# Patient Record
Sex: Female | Born: 1940 | Race: White | Hispanic: No | Smoking: Former smoker
Health system: Southern US, Community
[De-identification: ages and names within clinical notes are randomized; demographics above are authoritative.]

## PROBLEM LIST (undated history)

## (undated) DIAGNOSIS — N183 Chronic kidney disease, stage 3 unspecified: Secondary | ICD-10-CM

## (undated) DIAGNOSIS — F419 Anxiety disorder, unspecified: Secondary | ICD-10-CM

## (undated) DIAGNOSIS — M1990 Unspecified osteoarthritis, unspecified site: Secondary | ICD-10-CM

## (undated) DIAGNOSIS — Z9221 Personal history of antineoplastic chemotherapy: Secondary | ICD-10-CM

## (undated) DIAGNOSIS — M545 Low back pain, unspecified: Secondary | ICD-10-CM

## (undated) DIAGNOSIS — E7800 Pure hypercholesterolemia, unspecified: Secondary | ICD-10-CM

## (undated) DIAGNOSIS — F32A Depression, unspecified: Secondary | ICD-10-CM

## (undated) DIAGNOSIS — T7840XA Allergy, unspecified, initial encounter: Secondary | ICD-10-CM

## (undated) DIAGNOSIS — E119 Type 2 diabetes mellitus without complications: Secondary | ICD-10-CM

## (undated) DIAGNOSIS — Z923 Personal history of irradiation: Secondary | ICD-10-CM

## (undated) DIAGNOSIS — I10 Essential (primary) hypertension: Secondary | ICD-10-CM

## (undated) DIAGNOSIS — M7552 Bursitis of left shoulder: Secondary | ICD-10-CM

## (undated) DIAGNOSIS — K56609 Unspecified intestinal obstruction, unspecified as to partial versus complete obstruction: Secondary | ICD-10-CM

## (undated) DIAGNOSIS — Z860100 Personal history of colon polyps, unspecified: Secondary | ICD-10-CM

## (undated) DIAGNOSIS — C801 Malignant (primary) neoplasm, unspecified: Secondary | ICD-10-CM

## (undated) DIAGNOSIS — H8110 Benign paroxysmal vertigo, unspecified ear: Secondary | ICD-10-CM

## (undated) DIAGNOSIS — M810 Age-related osteoporosis without current pathological fracture: Secondary | ICD-10-CM

## (undated) HISTORY — DX: Essential (primary) hypertension: I10

## (undated) HISTORY — DX: Low back pain: M54.5

## (undated) HISTORY — DX: Pure hypercholesterolemia, unspecified: E78.00

## (undated) HISTORY — DX: Bursitis of left shoulder: M75.52

## (undated) HISTORY — DX: Malignant (primary) neoplasm, unspecified: C80.1

## (undated) HISTORY — DX: Benign paroxysmal vertigo, unspecified ear: H81.10

## (undated) HISTORY — DX: Unspecified osteoarthritis, unspecified site: M19.90

## (undated) HISTORY — PX: ABDOMINAL HYSTERECTOMY: SHX81

## (undated) HISTORY — DX: Personal history of colonic polyps: Z86.010

## (undated) HISTORY — DX: Chronic kidney disease, stage 3 unspecified: N18.30

## (undated) HISTORY — DX: Personal history of colon polyps, unspecified: Z86.0100

## (undated) HISTORY — DX: Allergy, unspecified, initial encounter: T78.40XA

## (undated) HISTORY — DX: Low back pain, unspecified: M54.50

## (undated) HISTORY — DX: Personal history of irradiation: Z92.3

## (undated) HISTORY — DX: Depression, unspecified: F32.A

## (undated) HISTORY — DX: Type 2 diabetes mellitus without complications: E11.9

## (undated) HISTORY — DX: Chronic kidney disease, stage 3 (moderate): N18.3

## (undated) HISTORY — DX: Personal history of antineoplastic chemotherapy: Z92.21

---

## 1991-04-26 HISTORY — PX: BREAST LUMPECTOMY: SHX2

## 1997-12-23 ENCOUNTER — Other Ambulatory Visit: Admission: RE | Admit: 1997-12-23 | Discharge: 1997-12-23 | Payer: Self-pay | Admitting: *Deleted

## 1998-12-25 ENCOUNTER — Other Ambulatory Visit: Admission: RE | Admit: 1998-12-25 | Discharge: 1998-12-25 | Payer: Self-pay | Admitting: *Deleted

## 1999-12-28 ENCOUNTER — Other Ambulatory Visit: Admission: RE | Admit: 1999-12-28 | Discharge: 1999-12-28 | Payer: Self-pay | Admitting: *Deleted

## 2000-12-29 ENCOUNTER — Other Ambulatory Visit: Admission: RE | Admit: 2000-12-29 | Discharge: 2000-12-29 | Payer: Self-pay | Admitting: *Deleted

## 2002-01-07 ENCOUNTER — Other Ambulatory Visit: Admission: RE | Admit: 2002-01-07 | Discharge: 2002-01-07 | Payer: Self-pay | Admitting: Obstetrics and Gynecology

## 2008-10-09 ENCOUNTER — Encounter (INDEPENDENT_AMBULATORY_CARE_PROVIDER_SITE_OTHER): Payer: Self-pay | Admitting: Obstetrics and Gynecology

## 2008-10-09 ENCOUNTER — Ambulatory Visit (HOSPITAL_BASED_OUTPATIENT_CLINIC_OR_DEPARTMENT_OTHER): Admission: RE | Admit: 2008-10-09 | Discharge: 2008-10-09 | Payer: Self-pay | Admitting: Obstetrics and Gynecology

## 2008-10-23 HISTORY — PX: ROBOTIC ASSISTED LAPAROSCOPIC VAGINAL HYSTERECTOMY WITH FIBROID REMOVAL: SHX5387

## 2008-10-30 ENCOUNTER — Ambulatory Visit: Admission: RE | Admit: 2008-10-30 | Discharge: 2008-10-30 | Payer: Self-pay | Admitting: Gynecologic Oncology

## 2008-12-04 ENCOUNTER — Ambulatory Visit: Admission: RE | Admit: 2008-12-04 | Discharge: 2008-12-04 | Payer: Self-pay | Admitting: Gynecologic Oncology

## 2008-12-05 ENCOUNTER — Ambulatory Visit: Payer: Self-pay | Admitting: Oncology

## 2009-01-06 ENCOUNTER — Ambulatory Visit: Payer: Self-pay | Admitting: Oncology

## 2009-01-06 LAB — COMPREHENSIVE METABOLIC PANEL
ALT: 14 U/L (ref 0–35)
AST: 17 U/L (ref 0–37)
Albumin: 4.4 g/dL (ref 3.5–5.2)
Alkaline Phosphatase: 59 U/L (ref 39–117)
BUN: 19 mg/dL (ref 6–23)
Calcium: 9.8 mg/dL (ref 8.4–10.5)
Chloride: 104 mEq/L (ref 96–112)
Potassium: 4.1 mEq/L (ref 3.5–5.3)
Sodium: 140 mEq/L (ref 135–145)
Total Protein: 7.1 g/dL (ref 6.0–8.3)

## 2009-01-06 LAB — CBC WITH DIFFERENTIAL/PLATELET
BASO%: 0.3 % (ref 0.0–2.0)
Basophils Absolute: 0 10*3/uL (ref 0.0–0.1)
EOS%: 3.6 % (ref 0.0–7.0)
HGB: 13.2 g/dL (ref 11.6–15.9)
MCH: 30.3 pg (ref 25.1–34.0)
MCV: 88.3 fL (ref 79.5–101.0)
MONO%: 6.7 % (ref 0.0–14.0)
RBC: 4.36 10*6/uL (ref 3.70–5.45)
RDW: 13.6 % (ref 11.2–14.5)
lymph#: 1.4 10*3/uL (ref 0.9–3.3)

## 2009-01-13 ENCOUNTER — Ambulatory Visit: Admission: RE | Admit: 2009-01-13 | Discharge: 2009-01-13 | Payer: Self-pay | Admitting: Gynecologic Oncology

## 2009-01-13 ENCOUNTER — Ambulatory Visit (HOSPITAL_COMMUNITY): Admission: RE | Admit: 2009-01-13 | Discharge: 2009-01-13 | Payer: Self-pay | Admitting: Oncology

## 2009-01-13 LAB — URINALYSIS, MICROSCOPIC - CHCC
Ketones: NEGATIVE mg/dL
Nitrite: NEGATIVE
Protein: NEGATIVE mg/dL
pH: 7 (ref 4.6–8.0)

## 2009-01-15 LAB — URINE CULTURE

## 2009-01-26 LAB — CBC WITH DIFFERENTIAL/PLATELET
Eosinophils Absolute: 0.1 10*3/uL (ref 0.0–0.5)
LYMPH%: 46.3 % (ref 14.0–49.7)
MONO#: 0.1 10*3/uL (ref 0.1–0.9)
NEUT#: 1.2 10*3/uL — ABNORMAL LOW (ref 1.5–6.5)
Platelets: 184 10*3/uL (ref 145–400)
RBC: 4.21 10*6/uL (ref 3.70–5.45)
WBC: 2.6 10*3/uL — ABNORMAL LOW (ref 3.9–10.3)
lymph#: 1.2 10*3/uL (ref 0.9–3.3)

## 2009-01-29 LAB — CBC WITH DIFFERENTIAL/PLATELET
Basophils Absolute: 0 10*3/uL (ref 0.0–0.1)
Eosinophils Absolute: 0.1 10*3/uL (ref 0.0–0.5)
HCT: 36.4 % (ref 34.8–46.6)
HGB: 12.1 g/dL (ref 11.6–15.9)
LYMPH%: 59.1 % — ABNORMAL HIGH (ref 14.0–49.7)
MCHC: 33.2 g/dL (ref 31.5–36.0)
MONO#: 0.3 10*3/uL (ref 0.1–0.9)
NEUT#: 1 10*3/uL — ABNORMAL LOW (ref 1.5–6.5)
NEUT%: 27.8 % — ABNORMAL LOW (ref 38.4–76.8)
Platelets: 178 10*3/uL (ref 145–400)
WBC: 3.4 10*3/uL — ABNORMAL LOW (ref 3.9–10.3)

## 2009-02-02 LAB — CBC WITH DIFFERENTIAL/PLATELET
Basophils Absolute: 0.1 10*3/uL (ref 0.0–0.1)
EOS%: 1.3 % (ref 0.0–7.0)
Eosinophils Absolute: 0.1 10*3/uL (ref 0.0–0.5)
HCT: 35.3 % (ref 34.8–46.6)
HGB: 11.6 g/dL (ref 11.6–15.9)
MCH: 29.2 pg (ref 25.1–34.0)
MCV: 88.9 fL (ref 79.5–101.0)
MONO%: 17.6 % — ABNORMAL HIGH (ref 0.0–14.0)
NEUT#: 5.4 10*3/uL (ref 1.5–6.5)
NEUT%: 51.2 % (ref 38.4–76.8)

## 2009-02-05 ENCOUNTER — Ambulatory Visit: Payer: Self-pay | Admitting: Oncology

## 2009-02-06 LAB — COMPREHENSIVE METABOLIC PANEL
AST: 21 U/L (ref 0–37)
Albumin: 3.8 g/dL (ref 3.5–5.2)
BUN: 16 mg/dL (ref 6–23)
Calcium: 9.5 mg/dL (ref 8.4–10.5)
Chloride: 107 mEq/L (ref 96–112)
Glucose, Bld: 101 mg/dL — ABNORMAL HIGH (ref 70–99)
Potassium: 4.1 mEq/L (ref 3.5–5.3)
Sodium: 140 mEq/L (ref 135–145)
Total Protein: 6.6 g/dL (ref 6.0–8.3)

## 2009-02-06 LAB — CBC WITH DIFFERENTIAL/PLATELET
Basophils Absolute: 0 10*3/uL (ref 0.0–0.1)
EOS%: 1.5 % (ref 0.0–7.0)
Eosinophils Absolute: 0.1 10*3/uL (ref 0.0–0.5)
HGB: 12.1 g/dL (ref 11.6–15.9)
LYMPH%: 41.8 % (ref 14.0–49.7)
MCH: 30 pg (ref 25.1–34.0)
MCV: 88.5 fL (ref 79.5–101.0)
MONO%: 10.9 % (ref 0.0–14.0)
Platelets: 195 10*3/uL (ref 145–400)
RBC: 4.04 10*6/uL (ref 3.70–5.45)
RDW: 13.9 % (ref 11.2–14.5)

## 2009-02-24 LAB — CBC WITH DIFFERENTIAL/PLATELET
BASO%: 0.1 % (ref 0.0–2.0)
HCT: 34.3 % — ABNORMAL LOW (ref 34.8–46.6)
LYMPH%: 26.5 % (ref 14.0–49.7)
MCHC: 33.5 g/dL (ref 31.5–36.0)
MCV: 90.7 fL (ref 79.5–101.0)
MONO#: 0.5 10*3/uL (ref 0.1–0.9)
MONO%: 6.6 % (ref 0.0–14.0)
NEUT%: 65.9 % (ref 38.4–76.8)
Platelets: 196 10*3/uL (ref 145–400)
RBC: 3.78 10*6/uL (ref 3.70–5.45)

## 2009-02-24 LAB — COMPREHENSIVE METABOLIC PANEL
ALT: 18 U/L (ref 0–35)
Alkaline Phosphatase: 95 U/L (ref 39–117)
CO2: 24 mEq/L (ref 19–32)
Creatinine, Ser: 0.99 mg/dL (ref 0.40–1.20)
Glucose, Bld: 113 mg/dL — ABNORMAL HIGH (ref 70–99)
Sodium: 140 mEq/L (ref 135–145)
Total Bilirubin: 0.2 mg/dL — ABNORMAL LOW (ref 0.3–1.2)
Total Protein: 6.9 g/dL (ref 6.0–8.3)

## 2009-03-02 LAB — WHOLE BLOOD GLUCOSE
Glucose: 324 mg/dL — ABNORMAL HIGH (ref 70–100)
HRS PC: 0 Hours

## 2009-03-02 LAB — CBC WITH DIFFERENTIAL/PLATELET
Basophils Absolute: 0 10*3/uL (ref 0.0–0.1)
Eosinophils Absolute: 0 10*3/uL (ref 0.0–0.5)
HGB: 11.3 g/dL — ABNORMAL LOW (ref 11.6–15.9)
MONO#: 0.1 10*3/uL (ref 0.1–0.9)
NEUT#: 3.2 10*3/uL (ref 1.5–6.5)
RBC: 3.77 10*6/uL (ref 3.70–5.45)
RDW: 17 % — ABNORMAL HIGH (ref 11.2–14.5)
WBC: 4 10*3/uL (ref 3.9–10.3)
lymph#: 0.8 10*3/uL — ABNORMAL LOW (ref 0.9–3.3)
nRBC: 0 % (ref 0–0)

## 2009-03-02 LAB — COMPREHENSIVE METABOLIC PANEL
Albumin: 3.7 g/dL (ref 3.5–5.2)
BUN: 16 mg/dL (ref 6–23)
CO2: 22 mEq/L (ref 19–32)
Glucose, Bld: 313 mg/dL — ABNORMAL HIGH (ref 70–99)
Potassium: 4.7 mEq/L (ref 3.5–5.3)
Sodium: 134 mEq/L — ABNORMAL LOW (ref 135–145)
Total Protein: 7 g/dL (ref 6.0–8.3)

## 2009-03-03 ENCOUNTER — Ambulatory Visit: Payer: Self-pay | Admitting: Oncology

## 2009-03-20 LAB — COMPREHENSIVE METABOLIC PANEL
CO2: 27 mEq/L (ref 19–32)
Creatinine, Ser: 0.97 mg/dL (ref 0.40–1.20)
Glucose, Bld: 108 mg/dL — ABNORMAL HIGH (ref 70–99)
Sodium: 144 mEq/L (ref 135–145)
Total Bilirubin: 0.3 mg/dL (ref 0.3–1.2)
Total Protein: 6.4 g/dL (ref 6.0–8.3)

## 2009-03-20 LAB — CBC WITH DIFFERENTIAL/PLATELET
Eosinophils Absolute: 0 10*3/uL (ref 0.0–0.5)
HCT: 31 % — ABNORMAL LOW (ref 34.8–46.6)
HGB: 10.6 g/dL — ABNORMAL LOW (ref 11.6–15.9)
LYMPH%: 29.9 % (ref 14.0–49.7)
MONO#: 0.5 10*3/uL (ref 0.1–0.9)
NEUT#: 3.1 10*3/uL (ref 1.5–6.5)
NEUT%: 59.6 % (ref 38.4–76.8)
Platelets: 227 10*3/uL (ref 145–400)
WBC: 5.1 10*3/uL (ref 3.9–10.3)

## 2009-04-09 ENCOUNTER — Ambulatory Visit: Payer: Self-pay | Admitting: Oncology

## 2009-04-13 ENCOUNTER — Ambulatory Visit: Admission: RE | Admit: 2009-04-13 | Discharge: 2009-04-13 | Payer: Self-pay | Admitting: Gynecologic Oncology

## 2009-04-13 LAB — COMPREHENSIVE METABOLIC PANEL
AST: 24 U/L (ref 0–37)
BUN: 15 mg/dL (ref 6–23)
Calcium: 9.6 mg/dL (ref 8.4–10.5)
Glucose, Bld: 210 mg/dL — ABNORMAL HIGH (ref 70–99)
Potassium: 4 mEq/L (ref 3.5–5.3)
Total Bilirubin: 0.4 mg/dL (ref 0.3–1.2)

## 2009-04-13 LAB — CBC WITH DIFFERENTIAL/PLATELET
BASO%: 0.1 % (ref 0.0–2.0)
LYMPH%: 17.7 % (ref 14.0–49.7)
MCHC: 33.9 g/dL (ref 31.5–36.0)
MONO#: 0 10*3/uL — ABNORMAL LOW (ref 0.1–0.9)
Platelets: 243 10*3/uL (ref 145–400)
RBC: 3.38 10*6/uL — ABNORMAL LOW (ref 3.70–5.45)
RDW: 20.2 % — ABNORMAL HIGH (ref 11.2–14.5)
WBC: 4.1 10*3/uL (ref 3.9–10.3)
lymph#: 0.7 10*3/uL — ABNORMAL LOW (ref 0.9–3.3)

## 2009-05-04 ENCOUNTER — Ambulatory Visit: Payer: Self-pay | Admitting: Oncology

## 2009-05-04 LAB — CBC WITH DIFFERENTIAL/PLATELET
EOS%: 0 % (ref 0.0–7.0)
Eosinophils Absolute: 0 10*3/uL (ref 0.0–0.5)
HCT: 33.6 % — ABNORMAL LOW (ref 34.8–46.6)
MCV: 99.6 fL (ref 79.5–101.0)
MONO%: 0.2 % (ref 0.0–14.0)
Platelets: 234 10*3/uL (ref 145–400)
RBC: 3.38 10*6/uL — ABNORMAL LOW (ref 3.70–5.45)
RDW: 18.2 % — ABNORMAL HIGH (ref 11.2–14.5)
WBC: 5.3 10*3/uL (ref 3.9–10.3)
lymph#: 0.6 10*3/uL — ABNORMAL LOW (ref 0.9–3.3)

## 2009-05-04 LAB — COMPREHENSIVE METABOLIC PANEL
ALT: 22 U/L (ref 0–35)
Albumin: 3.8 g/dL (ref 3.5–5.2)
Alkaline Phosphatase: 72 U/L (ref 39–117)
CO2: 23 mEq/L (ref 19–32)
Glucose, Bld: 252 mg/dL — ABNORMAL HIGH (ref 70–99)
Potassium: 4.6 mEq/L (ref 3.5–5.3)
Sodium: 138 mEq/L (ref 135–145)
Total Bilirubin: 0.3 mg/dL (ref 0.3–1.2)
Total Protein: 6.9 g/dL (ref 6.0–8.3)

## 2009-05-12 LAB — CBC WITH DIFFERENTIAL/PLATELET
Basophils Absolute: 0.1 10*3/uL (ref 0.0–0.1)
Eosinophils Absolute: 0.1 10*3/uL (ref 0.0–0.5)
HGB: 11.3 g/dL — ABNORMAL LOW (ref 11.6–15.9)
LYMPH%: 29.1 % (ref 14.0–49.7)
MCV: 99.4 fL (ref 79.5–101.0)
MONO%: 15 % — ABNORMAL HIGH (ref 0.0–14.0)
NEUT#: 4.6 10*3/uL (ref 1.5–6.5)
NEUT%: 54.4 % (ref 38.4–76.8)
Platelets: 132 10*3/uL — ABNORMAL LOW (ref 145–400)
RBC: 3.48 10*6/uL — ABNORMAL LOW (ref 3.70–5.45)

## 2009-05-22 ENCOUNTER — Ambulatory Visit (HOSPITAL_COMMUNITY): Admission: RE | Admit: 2009-05-22 | Discharge: 2009-05-22 | Payer: Self-pay | Admitting: Oncology

## 2009-05-27 ENCOUNTER — Ambulatory Visit: Admission: RE | Admit: 2009-05-27 | Discharge: 2009-05-27 | Payer: Self-pay | Admitting: Gynecologic Oncology

## 2009-05-27 ENCOUNTER — Other Ambulatory Visit: Admission: RE | Admit: 2009-05-27 | Discharge: 2009-05-27 | Payer: Self-pay | Admitting: Gynecologic Oncology

## 2009-05-27 LAB — CBC WITH DIFFERENTIAL/PLATELET
BASO%: 0.5 % (ref 0.0–2.0)
Eosinophils Absolute: 0.1 10*3/uL (ref 0.0–0.5)
MCV: 100.7 fL (ref 79.5–101.0)
MONO%: 9.8 % (ref 0.0–14.0)
NEUT#: 2.7 10*3/uL (ref 1.5–6.5)
RBC: 3.35 10*6/uL — ABNORMAL LOW (ref 3.70–5.45)
RDW: 16.3 % — ABNORMAL HIGH (ref 11.2–14.5)
WBC: 4.6 10*3/uL (ref 3.9–10.3)
lymph#: 1.4 10*3/uL (ref 0.9–3.3)

## 2009-05-27 LAB — COMPREHENSIVE METABOLIC PANEL
BUN: 16 mg/dL (ref 6–23)
CO2: 27 mEq/L (ref 19–32)
Creatinine, Ser: 0.93 mg/dL (ref 0.40–1.20)
Glucose, Bld: 105 mg/dL — ABNORMAL HIGH (ref 70–99)
Total Bilirubin: 0.4 mg/dL (ref 0.3–1.2)
Total Protein: 6.5 g/dL (ref 6.0–8.3)

## 2009-08-03 ENCOUNTER — Ambulatory Visit: Payer: Self-pay | Admitting: Oncology

## 2009-08-05 ENCOUNTER — Ambulatory Visit: Admission: RE | Admit: 2009-08-05 | Discharge: 2009-08-05 | Payer: Self-pay | Admitting: Gynecologic Oncology

## 2009-08-05 LAB — COMPREHENSIVE METABOLIC PANEL
Albumin: 4.3 g/dL (ref 3.5–5.2)
Alkaline Phosphatase: 56 U/L (ref 39–117)
BUN: 16 mg/dL (ref 6–23)
Calcium: 9.4 mg/dL (ref 8.4–10.5)
Glucose, Bld: 103 mg/dL — ABNORMAL HIGH (ref 70–99)
Potassium: 4.2 mEq/L (ref 3.5–5.3)
Sodium: 141 mEq/L (ref 135–145)
Total Protein: 6.3 g/dL (ref 6.0–8.3)

## 2009-08-05 LAB — CBC WITH DIFFERENTIAL/PLATELET
BASO%: 0.5 % (ref 0.0–2.0)
EOS%: 2.7 % (ref 0.0–7.0)
HCT: 38.5 % (ref 34.8–46.6)
MCH: 32.3 pg (ref 25.1–34.0)
MCHC: 34.1 g/dL (ref 31.5–36.0)
NEUT%: 48 % (ref 38.4–76.8)
RBC: 4.06 10*6/uL (ref 3.70–5.45)
WBC: 4.1 10*3/uL (ref 3.9–10.3)
lymph#: 1.7 10*3/uL (ref 0.9–3.3)

## 2009-11-11 ENCOUNTER — Ambulatory Visit: Payer: Self-pay | Admitting: Oncology

## 2009-11-13 LAB — CBC WITH DIFFERENTIAL/PLATELET
BASO%: 0.4 % (ref 0.0–2.0)
EOS%: 3.5 % (ref 0.0–7.0)
HCT: 35.9 % (ref 34.8–46.6)
LYMPH%: 39.4 % (ref 14.0–49.7)
MCH: 32.1 pg (ref 25.1–34.0)
MCHC: 34.7 g/dL (ref 31.5–36.0)
NEUT%: 49.7 % (ref 38.4–76.8)
RBC: 3.88 10*6/uL (ref 3.70–5.45)
WBC: 4 10*3/uL (ref 3.9–10.3)
lymph#: 1.6 10*3/uL (ref 0.9–3.3)

## 2009-11-13 LAB — COMPREHENSIVE METABOLIC PANEL
ALT: 22 U/L (ref 0–35)
AST: 24 U/L (ref 0–37)
Chloride: 106 mEq/L (ref 96–112)
Creatinine, Ser: 1.09 mg/dL (ref 0.40–1.20)
Sodium: 141 mEq/L (ref 135–145)
Total Bilirubin: 0.9 mg/dL (ref 0.3–1.2)

## 2009-11-16 ENCOUNTER — Ambulatory Visit (HOSPITAL_COMMUNITY): Admission: RE | Admit: 2009-11-16 | Discharge: 2009-11-16 | Payer: Self-pay | Admitting: Oncology

## 2009-11-19 ENCOUNTER — Other Ambulatory Visit: Admission: RE | Admit: 2009-11-19 | Discharge: 2009-11-19 | Payer: Self-pay | Admitting: Gynecologic Oncology

## 2009-11-19 ENCOUNTER — Ambulatory Visit: Admission: RE | Admit: 2009-11-19 | Discharge: 2009-11-19 | Payer: Self-pay | Admitting: Gynecologic Oncology

## 2010-02-09 ENCOUNTER — Ambulatory Visit: Payer: Self-pay | Admitting: Oncology

## 2010-02-11 ENCOUNTER — Ambulatory Visit: Admission: RE | Admit: 2010-02-11 | Discharge: 2010-02-11 | Payer: Self-pay | Admitting: Gynecologic Oncology

## 2010-02-11 LAB — COMPREHENSIVE METABOLIC PANEL
Albumin: 3.8 g/dL (ref 3.5–5.2)
Alkaline Phosphatase: 65 U/L (ref 39–117)
BUN: 18 mg/dL (ref 6–23)
CO2: 29 mEq/L (ref 19–32)
Chloride: 104 mEq/L (ref 96–112)
Potassium: 4 mEq/L (ref 3.5–5.3)
Sodium: 140 mEq/L (ref 135–145)
Total Bilirubin: 0.6 mg/dL (ref 0.3–1.2)
Total Protein: 7.1 g/dL (ref 6.0–8.3)

## 2010-02-11 LAB — CBC WITH DIFFERENTIAL/PLATELET
BASO%: 0.2 % (ref 0.0–2.0)
EOS%: 3.4 % (ref 0.0–7.0)
Eosinophils Absolute: 0.2 10*3/uL (ref 0.0–0.5)
HCT: 37.5 % (ref 34.8–46.6)
LYMPH%: 25.9 % (ref 14.0–49.7)
MCHC: 34.1 g/dL (ref 31.5–36.0)
MONO#: 0.5 10*3/uL (ref 0.1–0.9)
NEUT#: 4.2 10*3/uL (ref 1.5–6.5)
NEUT%: 62.5 % (ref 38.4–76.8)
RBC: 4.08 10*6/uL (ref 3.70–5.45)
WBC: 6.7 10*3/uL (ref 3.9–10.3)
lymph#: 1.7 10*3/uL (ref 0.9–3.3)

## 2010-03-01 ENCOUNTER — Ambulatory Visit (HOSPITAL_COMMUNITY): Admission: RE | Admit: 2010-03-01 | Discharge: 2010-03-01 | Payer: Self-pay | Admitting: Oncology

## 2010-03-12 ENCOUNTER — Ambulatory Visit
Admission: RE | Admit: 2010-03-12 | Discharge: 2010-04-21 | Payer: Self-pay | Source: Home / Self Care | Attending: Radiation Oncology | Admitting: Radiation Oncology

## 2010-03-30 ENCOUNTER — Ambulatory Visit: Payer: Self-pay | Admitting: Oncology

## 2010-03-31 LAB — COMPREHENSIVE METABOLIC PANEL
ALT: 18 U/L (ref 0–35)
CO2: 29 mEq/L (ref 19–32)
Calcium: 9.4 mg/dL (ref 8.4–10.5)
Chloride: 107 mEq/L (ref 96–112)
Creatinine, Ser: 1.17 mg/dL (ref 0.40–1.20)
Total Protein: 6.3 g/dL (ref 6.0–8.3)

## 2010-03-31 LAB — CBC WITH DIFFERENTIAL/PLATELET
BASO%: 0.3 % (ref 0.0–2.0)
EOS%: 2.1 % (ref 0.0–7.0)
HCT: 35.8 % (ref 34.8–46.6)
LYMPH%: 32.4 % (ref 14.0–49.7)
MCH: 31.6 pg (ref 25.1–34.0)
MCHC: 33.9 g/dL (ref 31.5–36.0)
MONO#: 0.4 10*3/uL (ref 0.1–0.9)
MONO%: 6.2 % (ref 0.0–14.0)
NEUT#: 3.6 10*3/uL (ref 1.5–6.5)
NEUT%: 59 % (ref 38.4–76.8)
Platelets: 310 10*3/uL (ref 145–400)
RBC: 3.84 10*6/uL (ref 3.70–5.45)
WBC: 6 10*3/uL (ref 3.9–10.3)

## 2010-04-22 LAB — COMPREHENSIVE METABOLIC PANEL
AST: 22 U/L (ref 0–37)
Albumin: 3.7 g/dL (ref 3.5–5.2)
Calcium: 9.4 mg/dL (ref 8.4–10.5)
Chloride: 107 mEq/L (ref 96–112)
Creatinine, Ser: 1.06 mg/dL (ref 0.40–1.20)
Potassium: 4.3 mEq/L (ref 3.5–5.3)
Sodium: 141 mEq/L (ref 135–145)

## 2010-04-22 LAB — CBC WITH DIFFERENTIAL/PLATELET
BASO%: 0.1 % (ref 0.0–2.0)
HCT: 34.5 % — ABNORMAL LOW (ref 34.8–46.6)
MCHC: 34.1 g/dL (ref 31.5–36.0)
MONO#: 0.5 10*3/uL (ref 0.1–0.9)
NEUT%: 63.3 % (ref 38.4–76.8)
WBC: 6.1 10*3/uL (ref 3.9–10.3)
lymph#: 1.7 10*3/uL (ref 0.9–3.3)

## 2010-04-30 ENCOUNTER — Ambulatory Visit: Payer: Self-pay | Admitting: Oncology

## 2010-04-30 LAB — CBC WITH DIFFERENTIAL/PLATELET
BASO%: 0.1 % (ref 0.0–2.0)
Basophils Absolute: 0 10*3/uL (ref 0.0–0.1)
EOS%: 0 % (ref 0.0–7.0)
Eosinophils Absolute: 0 10*3/uL (ref 0.0–0.5)
HCT: 38 % (ref 34.8–46.6)
HGB: 12.7 g/dL (ref 11.6–15.9)
LYMPH%: 11.9 % — ABNORMAL LOW (ref 14.0–49.7)
MCH: 30.8 pg (ref 25.1–34.0)
MCHC: 33.4 g/dL (ref 31.5–36.0)
MCV: 92.2 fL (ref 79.5–101.0)
MONO#: 0 10*3/uL — ABNORMAL LOW (ref 0.1–0.9)
MONO%: 0.3 % (ref 0.0–14.0)
NEUT#: 7.7 10*3/uL — ABNORMAL HIGH (ref 1.5–6.5)
NEUT%: 87.7 % — ABNORMAL HIGH (ref 38.4–76.8)
Platelets: 279 10*3/uL (ref 145–400)
RBC: 4.12 10*6/uL (ref 3.70–5.45)
RDW: 15 % — ABNORMAL HIGH (ref 11.2–14.5)
WBC: 8.7 10*3/uL (ref 3.9–10.3)
lymph#: 1 10*3/uL (ref 0.9–3.3)
nRBC: 0 % (ref 0–0)

## 2010-05-12 ENCOUNTER — Ambulatory Visit
Admission: RE | Admit: 2010-05-12 | Discharge: 2010-05-12 | Payer: Self-pay | Source: Home / Self Care | Attending: Gynecologic Oncology | Admitting: Gynecologic Oncology

## 2010-05-13 NOTE — Consult Note (Signed)
NAMEAXIE, HAYNE NO.:  [id number]  MEDICAL RECORD NO.:  [id number]          PATIENT TYPE:  OUT  LOCATION:  GYN                          FACILITY:  [facility]  PHYSICIAN:  Laurette Schimke, MD     DATE OF BIRTH:  [birth year]  DATE OF CONSULTATION:  05/12/2010 DATE OF DISCHARGE:                                CONSULTATION   REASON FOR VISIT:  Recurrent endometrial carcinoma.  HISTORY OF PRESENT ILLNESS:  [name] is a 70 year old who in April 2010 was diagnosed with a grade 1 endometrial cancer.  In July 2010, she underwent a robotic-assisted total laparoscopic hysterectomy, bilateral salpingo-oophorectomy, bilateral pelvic lymph node dissection.  Final pathology was notable for microscopic involvement of bilateral adnexa with 1-mm invasion into the uterine wall with grade 2 histology.  She was enrolled onto GOG protocol 258 and randomized to receive 6 cycles of Taxol and carboplatin therapy.  She completed this and was without evidence of disease on clinical and radiographic surveillance until October 2011 when at the time of vaginal examination was noted to have an 8-mm lesion in the left aspect of the vagina.  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  A PET scan was subsequently obtained and demonstrated the presence of a 1.7-cm subcutaneous soft tissue mass in the left anterior abdominal wall and mild uptake in the left obturator and inguinal lymph nodes consistent with metastatic disease.  She subsequently received 2 cycles of Taxol and carboplatin therapy.  [name] was initially able to palpate the abdominal wall metastatic lesion.  She states that this is no longer palpable.  She reports occasional vaginal bleeding.  She reports weight gain.  No nausea, vomiting or abdominal pain.  No changes in her bowel or rectal habits and she feels well.  PAST MEDICAL HISTORY:  Recurrent endometrial cancer.  PAST  SURGICAL HISTORY:  No interval changes.  SOCIAL HISTORY:  No changes.  REVIEW OF SYSTEMS:  Ten-point review of systems noted as above.  PHYSICAL EXAMINATION:  GENERAL:  Well-developed female in no acute distress. VITAL SIGNS:  Weight 163 pounds, blood pressure 130/70, pulse of 76. Pain 0/10. CHEST:  Clear to auscultation. HEART:  Regular rate and rhythm. ABDOMEN:  Soft, nontender.  No palpable masses at the laparoscopic port sites.  No evidence of an omental cake or fluid wave. PELVIC:  The vaginal lesion is still visible and has decreased in size. Vagina otherwise is atrophic. RECTAL:  Good anal sphincter tone without any masses. EXTREMITIES:  No clubbing, cyanosis or edema.  IMPRESSION AND PLAN:  Recurrent endometrial cancer.  Cycle #2 of Taxol and carboplatin therapy was administered on April 22, 2010.  Cycle #3 is scheduled for next week.  The plan is to administer 4 cycles and then repeat radiological assessment.  It is likely that with resolution of the other metastatic sites, radiation therapy would still be needed on sterilization of the vagina.  The mass has decreased somewhat in size but is still quite visible.  Dr. Roselind Messier saw [name] in consultation on March 18, 2010, and his assessment and recommendations are noted and appreciated. I  have asked [name] to follow up after her radiographic assessment.  At this visit, a CA-125 was added to her pre-chemo labs.     Laurette Schimke, MD     WB/MEDQ  D:  05/12/2010  T:  05/12/2010  Job:  [id number]  cc:   Telford Nab, R.N. [address] [place], [place] [address]  Lennis P. Darrold Span, M.D. Fax: [phone]  Billie Lade, M.D. Fax: [phone]  Gaspar Garbe, M.D. Fax: [phone]  Electronically Signed by Laurette Schimke MD on 05/13/2010 02:37:26 PM

## 2010-05-14 LAB — COMPREHENSIVE METABOLIC PANEL
ALT: 19 U/L (ref 0–35)
AST: 18 U/L (ref 0–37)
Albumin: 4.1 g/dL (ref 3.5–5.2)
Alkaline Phosphatase: 88 U/L (ref 39–117)
BUN: 16 mg/dL (ref 6–23)
CO2: 27 mEq/L (ref 19–32)
Calcium: 9.6 mg/dL (ref 8.4–10.5)
Chloride: 104 mEq/L (ref 96–112)
Creatinine, Ser: 1.06 mg/dL (ref 0.40–1.20)
Glucose, Bld: 103 mg/dL — ABNORMAL HIGH (ref 70–99)
Potassium: 4.2 mEq/L (ref 3.5–5.3)
Sodium: 140 mEq/L (ref 135–145)
Total Bilirubin: 0.3 mg/dL (ref 0.3–1.2)
Total Protein: 6.2 g/dL (ref 6.0–8.3)

## 2010-05-14 LAB — CBC WITH DIFFERENTIAL/PLATELET
BASO%: 0.2 % (ref 0.0–2.0)
Basophils Absolute: 0 10*3/uL (ref 0.0–0.1)
EOS%: 1.3 % (ref 0.0–7.0)
Eosinophils Absolute: 0.1 10*3/uL (ref 0.0–0.5)
HCT: 34.3 % — ABNORMAL LOW (ref 34.8–46.6)
HGB: 11.6 g/dL (ref 11.6–15.9)
LYMPH%: 26 % (ref 14.0–49.7)
MCH: 31.7 pg (ref 25.1–34.0)
MCHC: 34 g/dL (ref 31.5–36.0)
MCV: 93.4 fL (ref 79.5–101.0)
MONO#: 0.3 10*3/uL (ref 0.1–0.9)
MONO%: 4.6 % (ref 0.0–14.0)
NEUT#: 4 10*3/uL (ref 1.5–6.5)
NEUT%: 67.9 % (ref 38.4–76.8)
Platelets: 215 10*3/uL (ref 145–400)
RBC: 3.67 10*6/uL — ABNORMAL LOW (ref 3.70–5.45)
RDW: 16.8 % — ABNORMAL HIGH (ref 11.2–14.5)
WBC: 6 10*3/uL (ref 3.9–10.3)
lymph#: 1.6 10*3/uL (ref 0.9–3.3)

## 2010-05-14 LAB — CA 125: CA 125: 9.6 U/mL (ref 0.0–30.2)

## 2010-05-16 ENCOUNTER — Encounter: Payer: Self-pay | Admitting: Oncology

## 2010-05-17 ENCOUNTER — Other Ambulatory Visit: Payer: Self-pay | Admitting: Oncology

## 2010-05-17 DIAGNOSIS — C541 Malignant neoplasm of endometrium: Secondary | ICD-10-CM

## 2010-05-21 LAB — CBC WITH DIFFERENTIAL/PLATELET
BASO%: 0.4 % (ref 0.0–2.0)
Basophils Absolute: 0 10*3/uL (ref 0.0–0.1)
HCT: 35.1 % (ref 34.8–46.6)
HGB: 12 g/dL (ref 11.6–15.9)
MCH: 31.9 pg (ref 25.1–34.0)
MCV: 93.6 fL (ref 79.5–101.0)
MONO%: 0.1 % (ref 0.0–14.0)
NEUT%: 83.1 % — ABNORMAL HIGH (ref 38.4–76.8)
Platelets: 278 10*3/uL (ref 145–400)
lymph#: 0.9 10*3/uL (ref 0.9–3.3)

## 2010-06-09 ENCOUNTER — Other Ambulatory Visit: Payer: Self-pay | Admitting: Oncology

## 2010-06-09 ENCOUNTER — Encounter (HOSPITAL_BASED_OUTPATIENT_CLINIC_OR_DEPARTMENT_OTHER): Payer: No Typology Code available for payment source | Admitting: Oncology

## 2010-06-09 DIAGNOSIS — C549 Malignant neoplasm of corpus uteri, unspecified: Secondary | ICD-10-CM

## 2010-06-09 DIAGNOSIS — Z5111 Encounter for antineoplastic chemotherapy: Secondary | ICD-10-CM

## 2010-06-09 LAB — CBC WITH DIFFERENTIAL/PLATELET
Basophils Absolute: 0 10*3/uL (ref 0.0–0.1)
EOS%: 0.6 % (ref 0.0–7.0)
Eosinophils Absolute: 0 10*3/uL (ref 0.0–0.5)
HGB: 11.8 g/dL (ref 11.6–15.9)
LYMPH%: 25.8 % (ref 14.0–49.7)
MCH: 32.6 pg (ref 25.1–34.0)
MCV: 96.1 fL (ref 79.5–101.0)
MONO%: 8.5 % (ref 0.0–14.0)
NEUT#: 3.9 10*3/uL (ref 1.5–6.5)
Platelets: 223 10*3/uL (ref 145–400)
RBC: 3.63 10*6/uL — ABNORMAL LOW (ref 3.70–5.45)
RDW: 17.7 % — ABNORMAL HIGH (ref 11.2–14.5)
WBC: 6 10*3/uL (ref 3.9–10.3)

## 2010-06-09 LAB — COMPREHENSIVE METABOLIC PANEL
AST: 17 U/L (ref 0–37)
Albumin: 4.4 g/dL (ref 3.5–5.2)
Alkaline Phosphatase: 85 U/L (ref 39–117)
BUN: 19 mg/dL (ref 6–23)
Creatinine, Ser: 1.03 mg/dL (ref 0.40–1.20)
Glucose, Bld: 120 mg/dL — ABNORMAL HIGH (ref 70–99)
Sodium: 141 mEq/L (ref 135–145)

## 2010-06-11 ENCOUNTER — Encounter (HOSPITAL_BASED_OUTPATIENT_CLINIC_OR_DEPARTMENT_OTHER): Payer: Medicare Other | Admitting: Oncology

## 2010-06-11 DIAGNOSIS — C549 Malignant neoplasm of corpus uteri, unspecified: Secondary | ICD-10-CM

## 2010-06-11 DIAGNOSIS — Z5111 Encounter for antineoplastic chemotherapy: Secondary | ICD-10-CM

## 2010-06-12 ENCOUNTER — Encounter (HOSPITAL_BASED_OUTPATIENT_CLINIC_OR_DEPARTMENT_OTHER): Payer: Medicare Other | Admitting: Oncology

## 2010-06-12 DIAGNOSIS — Z5189 Encounter for other specified aftercare: Secondary | ICD-10-CM

## 2010-06-12 DIAGNOSIS — C549 Malignant neoplasm of corpus uteri, unspecified: Secondary | ICD-10-CM

## 2010-07-05 ENCOUNTER — Other Ambulatory Visit (HOSPITAL_COMMUNITY): Payer: Self-pay

## 2010-07-05 ENCOUNTER — Ambulatory Visit (HOSPITAL_COMMUNITY)
Admission: RE | Admit: 2010-07-05 | Discharge: 2010-07-05 | Disposition: A | Discharge status: Home / Self Care | Payer: Medicare Other | Source: Ambulatory Visit | Attending: Oncology | Admitting: Oncology

## 2010-07-05 DIAGNOSIS — C541 Malignant neoplasm of endometrium: Secondary | ICD-10-CM

## 2010-07-05 DIAGNOSIS — C50919 Malignant neoplasm of unspecified site of unspecified female breast: Secondary | ICD-10-CM | POA: Insufficient documentation

## 2010-07-05 DIAGNOSIS — R599 Enlarged lymph nodes, unspecified: Secondary | ICD-10-CM | POA: Insufficient documentation

## 2010-07-05 DIAGNOSIS — C779 Secondary and unspecified malignant neoplasm of lymph node, unspecified: Secondary | ICD-10-CM | POA: Insufficient documentation

## 2010-07-05 DIAGNOSIS — Z9221 Personal history of antineoplastic chemotherapy: Secondary | ICD-10-CM | POA: Insufficient documentation

## 2010-07-05 DIAGNOSIS — C549 Malignant neoplasm of corpus uteri, unspecified: Secondary | ICD-10-CM | POA: Insufficient documentation

## 2010-07-05 MED ORDER — IOHEXOL 300 MG/ML  SOLN
100.0000 mL | Freq: Once | INTRAMUSCULAR | Status: AC | PRN
  Administered 2010-07-05: 100 mL via INTRAVENOUS

## 2010-07-08 ENCOUNTER — Ambulatory Visit: Payer: Medicare Other | Attending: Gynecologic Oncology | Admitting: Gynecologic Oncology

## 2010-07-08 DIAGNOSIS — R599 Enlarged lymph nodes, unspecified: Secondary | ICD-10-CM | POA: Insufficient documentation

## 2010-07-08 DIAGNOSIS — C549 Malignant neoplasm of corpus uteri, unspecified: Secondary | ICD-10-CM | POA: Insufficient documentation

## 2010-07-08 NOTE — Consult Note (Signed)
NAMECYNTHIS, [name] NO.:  [id number]  MEDICAL RECORD NO.:  [id number]           PATIENT TYPE:  O  LOCATION:  XRAY                         FACILITY:  [facility]  PHYSICIAN:  Laurette Schimke, MD     DATE OF BIRTH:  08-01-1940  DATE OF CONSULTATION:  07/08/2010 DATE OF DISCHARGE:                                  CONSULTATION   REASON FOR VISIT:  Recurrent endometrial cancer.  HISTORY OF PRESENT ILLNESS:  This is a 70 year old who presented with vaginal bleeding in 2010 which was notable for a grade 1 endometrial cancer.  In July  2010, she underwent robotic assisted laparoscopic hysterectomy and bilateral salpingo-oophorectomy and lymph node dissection.  Final pathology was notable for involvement of bilateral ovaries.  She was enrolled in GOG protocol 258 and randomized to receive 6 cycles of paclitaxel and carboplatin.  Post-treatment CT scan in May 22, 2009, was negative.  A CT scan of the abdomen and pelvis on November 16, 2009, was also negative.  She presented for surveillance, and she was noted to have on surveillance visit at the end of 2011 vaginal recurrence.  A CT scan was obtained, was notable for a mass at the pelvic port site and also enlarged obturator nodes.  She received 4 cycles of carboplatin and Taxol.  Imaging on July 05, 2010, showed that left obturator and inguinal lymphadenopathy decreased, and there were findings of soft tissue mass in the abdominal wall had decreased in size.  [name] reports increasing appetite.  She reports that she feels better.  Reports weight gain.  She denies nausea, vomiting, or abdominal pain.  She reports some nausea and constipation associated with chemotherapy and antiemetic medications.  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  PAST MEDICAL HISTORY:  No interval changes.  PAST SURGICAL HISTORY:  No interval changes.  SOCIAL HISTORY:  Continues to  work.  REVIEW OF SYSTEMS:  A 10-point review of systems is as noted above.  PHYSICAL EXAMINATION:  GENERAL:  Well-developed female in no acute distress. CHEST:  Clear to auscultation. HEART:  Regular rate and rhythm. LYMPH NODES:  There is no cervical, supraclavicular, or inguinal adenopathy. ABDOMEN:  Soft, nontender.  There are no palpable masses at the port site. PELVIC:  The vaginal metastasis is much smaller now, measures approximately 3-4 murmur, it is nonfriable.  There is no nodularity within the vaginal vault. RECTAL:  External hemorrhoids were appreciated.  Good anal sphincter tone without any rectal masses.  IMPRESSION:  Recurrent endometrial cancer.  Per the recent radiological report, adenopathy appears to be within the area of the pelvis including the vaginal apex, left inguinal lymph nodes in the anterior abdominal wall.  There are no new sites and she has completed 4 cycles of Taxol carboplatin therapy.  [name] has seen Dr. Antony Blackbird in consultation at the time of initial presentation.  I had a re- discussion with him this morning about her treatment plan, and he will evaluate her within the next 2 weeks to see if it is possible to treat this residual  recurrence with radiotherapy.  Of note, cycle #4 of Taxol carboplatin therapy was administered on June 11, 2009.     Laurette Schimke, MD     WB/MEDQ  D:  07/08/2010  T:  07/08/2010  Job:  [id number]  cc:   Lennis P. Darrold Span, M.D. Fax: [phone]  Billie Lade, Ph.D., M.D. Fax: [phone]  Gaspar Garbe, M.D. Fax: [phone]  Almedia Balls. Ranell Patrick, M.D. Fax: [phone]  Electronically Signed by Laurette Schimke MD on 07/08/2010 09:40:31 AM

## 2010-07-19 ENCOUNTER — Ambulatory Visit: Payer: Medicare Other | Attending: Radiation Oncology | Admitting: Radiation Oncology

## 2010-07-19 DIAGNOSIS — C775 Secondary and unspecified malignant neoplasm of intrapelvic lymph nodes: Secondary | ICD-10-CM | POA: Insufficient documentation

## 2010-07-19 DIAGNOSIS — Z510 Encounter for antineoplastic radiation therapy: Secondary | ICD-10-CM | POA: Insufficient documentation

## 2010-07-19 DIAGNOSIS — Y842 Radiological procedure and radiotherapy as the cause of abnormal reaction of the patient, or of later complication, without mention of misadventure at the time of the procedure: Secondary | ICD-10-CM | POA: Insufficient documentation

## 2010-07-19 DIAGNOSIS — R197 Diarrhea, unspecified: Secondary | ICD-10-CM | POA: Insufficient documentation

## 2010-07-19 DIAGNOSIS — K2960 Other gastritis without bleeding: Secondary | ICD-10-CM | POA: Insufficient documentation

## 2010-07-19 DIAGNOSIS — C549 Malignant neoplasm of corpus uteri, unspecified: Secondary | ICD-10-CM | POA: Insufficient documentation

## 2010-07-19 DIAGNOSIS — C7982 Secondary malignant neoplasm of genital organs: Secondary | ICD-10-CM | POA: Insufficient documentation

## 2010-07-19 DIAGNOSIS — C774 Secondary and unspecified malignant neoplasm of inguinal and lower limb lymph nodes: Secondary | ICD-10-CM | POA: Insufficient documentation

## 2010-08-02 LAB — POCT I-STAT 4, (NA,K, GLUC, HGB,HCT)
Glucose, Bld: 110 mg/dL — ABNORMAL HIGH (ref 70–99)
Potassium: 4.2 mEq/L (ref 3.5–5.1)
Sodium: 139 mEq/L (ref 135–145)

## 2010-09-07 NOTE — Op Note (Signed)
[name], [name]             ACCOUNT NO.:  [id number]   MEDICAL RECORD NO.:  [id number]          PATIENT TYPE:  AMB   LOCATION:  [facility]                         FACILITY:  [facility]   PHYSICIAN:  Sherry A. Dickstein, M.D.DATE OF BIRTH:  01/30/1941   DATE OF PROCEDURE:  10/09/2008  DATE OF DISCHARGE:                               OPERATIVE REPORT   PREOPERATIVE DIAGNOSES:  Endometrial polyp, postmenopausal bleeding.   POSTOPERATIVE DIAGNOSIS:  Multiple endometrial polyps.   PROCEDURE:  Dilation and curettage, hysteroscopy with resectoscope.   SURGEON:  Sherry A. Rosalio Macadamia, M.D.   ANESTHESIA:  MAC.   INDICATIONS:  This is a 70 year old G1, P-1-0-0-1 woman who has had  vaginal spotting for about 1-1/2 months.  She is had some increased  bleeding over the past 2 weeks.  The patient has had a known endometrial  polyp prior to this, however, she had no vaginal bleeding.  However,  because she started bleeding she had a   Dictation ends here, incomplete.      Sherry A. Rosalio Macadamia, M.D.     SAD/MEDQ  D:  10/09/2008  T:  10/09/2008  Job:  [id number]

## 2010-09-07 NOTE — Consult Note (Signed)
[name], [name] NO.:  [id number]   MEDICAL RECORD NO.:  [id number]          PATIENT TYPE:  OUT   LOCATION:  GYN                          FACILITY:  [facility]   PHYSICIAN:  Laurette Schimke, MD     DATE OF BIRTH:  1940/08/18   DATE OF CONSULTATION:  12/04/2008  DATE OF DISCHARGE:                                 CONSULTATION   REASON FOR VISIT:  Postoperative check, endometrial cancer.   HISTORY OF PRESENT ILLNESS:  This is a 69 year old who presented with  vaginal bleeding in March of 2010.  Ultrasound was consistent with a  polyp. A D and C was performed in June of 2010 and demonstrated presence  of a grade 1 endometrial cancer.  On November 19, 2008 she underwent a  robotic-assisted laparoscopic hysterectomy with bilateral pelvic lymph  node dissection.  Final pathology was noted for grade 1 endometrioid  endometrial cancer with 1 mm of myometrial invasion.  No lymphovascular  space invasion was identified.  Metastatic microscopic disease was  appreciated on bilateral adnexa stage IIIC.  [name] presents  today for postoperative check.   For this visit, she denies any nausea, vomiting, abdominal pain, fever,  chills.  She has normal bowel movements and plans to return to work  shortly.   PHYSICAL EXAMINATION:  VITAL SIGNS:  Weight 156 pounds.  Blood pressure  132/80.  ABDOMEN:  Soft, nontender.  Sutures were still present over the trocar  sites and were removed.  No hernia or tenderness.   IMPRESSION:  Stage IIIA, grade 1 endometrioid endometrial  adenocarcinoma.  [name] was given the following options:  The  first was adjuvant chemotherapy with Paclitaxel and carboplatin every 3  weeks for 6 cycles.  Alternatively, she was encouraged to consider  involvement in GOG 258 which is a randomized phase 3 trial of cisplatin  and tumor volume directed radiation followed by carboplatin and  Paclitaxel verus carboplatin and Paclitaxel.  The principles of  the  study were outlined with the patient and her daughter.  Their questions  were  answered.  One of the [employer] will follow up with  [name].  I have scheduled an appointment with Dr. Jama Flavors  because chemotherapy will be required irrespective of which arm she  randomizes  or even if she elects not to participate in a trial.  I have  asked [name] to follow up in 1 month.      Laurette Schimke, MD  Electronically Signed     WB/MEDQ  D:  12/04/2008  T:  12/04/2008  Job:  [id number]   cc:   Telford Nab, R.N.  [address]  [place], [place] [address]   Eliberto Ivory. Rosalio Macadamia, M.D.  Fax: [phone]   Lennis P. Darrold Span, M.D.  Fax: [phone]

## 2010-09-07 NOTE — Op Note (Signed)
[name], [name]             ACCOUNT NO.:  [id number]   MEDICAL RECORD NO.:  [id number]          PATIENT TYPE:  AMB   LOCATION:  [facility]                         FACILITY:  [facility]   PHYSICIAN:  Sherry A. Dickstein, M.D.DATE OF BIRTH:  1940-09-06   DATE OF PROCEDURE:  10/09/2008  DATE OF DISCHARGE:                               OPERATIVE REPORT   PREOPERATIVE DIAGNOSES:  1. Postmenopausal bleeding.  2. Endometrial polyp.   POSTOPERATIVE DIAGNOSES:  1. Postmenopausal bleeding.  2. Endometrial polyp.  3. Multiple polyps.   SURGEON:  Dr. Cordelia Pen A. Dickstein.   ANESTHESIA:  MAC.   INDICATIONS:  This is a 70 year old G1, P 1-0-0-1, woman who had a  pelvic ultrasound on August 05, 2008, because of abdominal bloating.  At  that time a thickened endometrium was seen, which showed an endometrial  polyp.  It was felt that she had a single polyp on that ultrasound, and  therefore she was told that if she had any vaginal bleeding whatsoever,  to come back and that polyp would be removed.  The patient contacted the  office on September 24, 2008, saying that she was having some vaginal bleeding  and she was informed come into the office to have a D&C and hysteroscopy  set up.  When she was seen in the office, she said that she had been  actually having bleeding on and off for the past two months, essentially  since she had her last ultrasound, and this was when she first contacted  [place].  Therefore she is brought to the operating room for a D&C and  hysteroscopy with resectoscope.   FINDINGS:  A normal size retroverted uterus.  No adnexal mass.  Multiple  polypoid tissue present within the endometrial cavity, instead of one  distinct polyp present.   DESCRIPTION OF PROCEDURE:  The patient was brought into the operating  room and given adequate IV sedation.  She was placed in the dorsal  lithotomy position.  Her perineum was washed with Betadine.  Pelvic  examination was performed.  The surgeon's  gown and gloves were changed.  The speculum was placed within the vagina.  The vagina was washed with  Betadine.  A paracervical block was performed with 1% Nesacaine.  The  anterior lip of the cervix was grasped with a single-tooth tenaculum.  The cervix was sounded.  The cervix was dilated with Pratt dilators to a  #33.  The hysteroscope was introduced into the endometrial cavity.  Pictures were obtained.  Multiple polyps were present in the posterior  wall of the uterus.  Using a single loop right angle resector, sheets of  endometrial tissue were removed.  Not all of the tissue could be  removed, since it was very difficult to see the entire endometrial  cavity because of visualization.  Therefore significant tissue was  removed and then sharp curettage was performed as well.  Once this  tissue was removed, it was felt that adequate tissue sampling had been  performed and no further resections were performed.  All instruments  were removed from the vagina.  The patient was  taken from the dorsal  lithotomy position.   She was moved from the operating table to a stretcher in stable  condition, after being awakened.  The  estimated blood loss was 10 mL.  Sorbitol differential was 80 mL.      Sherry A. Rosalio Macadamia, M.D.  Electronically Signed     SAD/MEDQ  D:  10/09/2008  T:  10/09/2008  Job:  [id number]

## 2010-09-07 NOTE — Consult Note (Signed)
NAMEMarland Bartlett  YULA, CROTWELL NO.:  [id number]   MEDICAL RECORD NO.:  [id number]          PATIENT TYPE:  OUT   LOCATION:  GYN                          FACILITY:  [facility]   PHYSICIAN:  Laurette Schimke, MD     DATE OF BIRTH:  [birth year]   DATE OF CONSULTATION:  10/30/2008  DATE OF DISCHARGE:                                 CONSULTATION   REASON FOR VISIT:  The patient seen in consult at the request of Dr.  Floyde Parkins for management of endometrial cancer.   HISTORY OF PRESENT ILLNESS:  This is a 70 year old gravida 1, para 1 who  noted vaginal bleeding in March 2010.  Ultrasound was obtained, which  was consistent with a polyp.  [name] then developed persistent  cramping and underwent a hysteroscopy, D and C, and resection of  endometrial polyps with the use of a rectoscope.  Final pathology was  consistent with a grade 1 endometrial cancer.  [name] reports a  history of Prempro use of several years' duration, which she stopped at  the time of the [facility] findings and an increased risk of breast cancer.  She reports a remote history of OCP use of 10-15 years duration.  Is  unsure if she ever received unopposed estrogen.   PAST SURGICAL HISTORY:  Repair of laceration of multiple right leg  muscles in August 2007.  D and C, horoscope, polyp resection June 2010.  Left breast lumpectomy for benign indications in 1990.   PAST MEDICAL HISTORY:  Hypertension diagnosed in 1993,  hypercholesterolemia diagnosed in 1993.   GYN HISTORY:  Gravida 1, para 1, vaginal delivery.  Last menstrual  period of approximately age of 76 or 85.   SOCIAL HISTORY:  Widowed in 37.  Reports 1 pack per day tobacco use  for 10 years' duration.  This stopped in 1977.   MEDICATIONS:  1. Evista 1 tablet daily.  2. Calcium 500 mg daily.  3. Multivitamin daily.  4. Zocor 10 mg daily.  5. Lotensin 20 mg daily.  6. Allegra daily.  7. Meclizine p.r.n.  8. Aspirin use daily.  9. Vitamin  D 1000 units daily.  10.Folic acid.  11.Fish oil.   FAMILY HISTORY:  Notable for breast cancer in mother at the age of 96.  Hypertension in her mother and sister.  Myocardial infarction in her  father at the age of 9.  Diabetes mellitus in sister diagnosed at the  age of 64-21.   SCREENING:  Mammography in October 2009.  Colonoscopy recently  performed, which was negative.   PHYSICAL EXAMINATION:  CONSTITUTIONAL:  Well-developed female in no  acute distress.  VITAL SIGNS:  Height 5 feet 6 inches, blood pressure 124/80, weight 158  pounds, BMI 25.5.  CHEST:  Clear to auscultation.  NECK:  Supple.  Normal range of motion.  BACK:  No CVA tenderness.  LYMPHATICS:  No cervical, supraclavicular, or inguinal adenopathy .  ABDOMEN:  Soft and nontender.  No masses.  PELVIC:  Normal external genitalia, Bartholin's, urethra, and Skene's.  Scanty bloody vaginal discharge within the vagina.  Cervix approximately  2 cm.  Uterus approximately 7 cm.  No cervical motion tenderness.  No  nodularity in the cul-de-sac.  RECTAL:  Good anal sphincter tone without any masses.   IMPRESSION:  This is a 70 year old with a grade 1 endometrial cancer.  The proposition was for that of a robotic-assisted laparoscopic  hysterectomy, bilateral salpingo-oophorectomy, pelvic lymph node  dissection to be performed at [facility] with possible surgical date of November 19, 2008.  The risks of the procedure discussed with the patient were  that of infection, bleeding, damage to surrounding structures, prolonged  hospitalization, and reoperation.  She is aware of the possibility of  need for a laparotomy.  Her and her daughter's questions are answered to  their satisfaction.  A pre-care appointment at [facility] has been scheduled  for November 10, 2008.      Laurette Schimke, MD  Electronically Signed     WB/MEDQ  D:  10/30/2008  T:  10/30/2008  Job:  [id number]   cc:   Telford Nab, R.N.  [address]  [place], [place]  [address]   Eliberto Ivory. Rosalio Macadamia, M.D.  Fax: [phone]

## 2010-09-30 ENCOUNTER — Ambulatory Visit: Payer: Medicare Other | Admitting: Gynecologic Oncology

## 2010-10-21 ENCOUNTER — Ambulatory Visit: Payer: Medicare Other | Attending: Gynecologic Oncology | Admitting: Gynecologic Oncology

## 2010-10-21 DIAGNOSIS — R350 Frequency of micturition: Secondary | ICD-10-CM | POA: Insufficient documentation

## 2010-10-21 DIAGNOSIS — E7800 Pure hypercholesterolemia, unspecified: Secondary | ICD-10-CM | POA: Insufficient documentation

## 2010-10-21 DIAGNOSIS — I10 Essential (primary) hypertension: Secondary | ICD-10-CM | POA: Insufficient documentation

## 2010-10-21 DIAGNOSIS — Z9079 Acquired absence of other genital organ(s): Secondary | ICD-10-CM | POA: Insufficient documentation

## 2010-10-21 DIAGNOSIS — R634 Abnormal weight loss: Secondary | ICD-10-CM | POA: Insufficient documentation

## 2010-10-21 DIAGNOSIS — R197 Diarrhea, unspecified: Secondary | ICD-10-CM | POA: Insufficient documentation

## 2010-10-21 DIAGNOSIS — Z90710 Acquired absence of both cervix and uterus: Secondary | ICD-10-CM | POA: Insufficient documentation

## 2010-10-21 DIAGNOSIS — C549 Malignant neoplasm of corpus uteri, unspecified: Secondary | ICD-10-CM | POA: Insufficient documentation

## 2010-10-22 NOTE — Consult Note (Signed)
  NAMEMarland Kitchen  [name], [name] NO.:  [id number]  MEDICAL RECORD NO.:  [id number]  LOCATION:  GYN                          FACILITY:  [facility]  PHYSICIAN:  Laurette Schimke, MD     DATE OF BIRTH:  December 08, 1940  DATE OF CONSULTATION:  10/21/2010 DATE OF DISCHARGE:                                CONSULTATION   REASON FOR VISIT:  Recurrent endometrial cancer.  HISTORY OF PRESENT ILLNESS:  This is a 70 year old who underwent a robotic-assisted laparoscopic hysterectomy, bilateral salpingo- oophorectomy and lymph node dissection in July of 2010.  Final pathology, she was noted to have a stage IIIA endometrial cancer and was enrolled in GOG protocol 258 and randomized to receive 6 cycles of Taxol and carboplatin.  Post-treatment CT scan in January 2008 was negative and at the end of 2011, on surveillance visit, was noted to have vaginal recurrence.  A CT scan at that time noted a mass at the pelvic port site in the left upper quadrant and enlarged obturator nodes.  She received a total of 6 cycles of carboplatin and Taxol therapy.  Imaging demonstrated residual left obturator and inguinal lymphadenopathy. There was still residual soft tissue mass noted in the subcutaneous tissue and the vaginal mass decreased in size, although still present. She subsequently underwent external beam pelvic radiotherapy along with vaginal brachytherapy and treatment of the port sites.  [name] reports diarrhea during this treatment.  Otherwise, she states that she is doing really well.  PAST MEDICAL HISTORY:  Recurrent endometrial cancer, hypertension, hypercholesterolemia.  PAST SURGICAL HISTORY:  Robotic laparoscopic total hysterectomy and bilateral salpingo-oophorectomy, bilateral pelvic lymph node dissection.  SOCIAL HISTORY:  Her daughter just visited last weekend from [place] and they had a lovely visit.  REVIEW OF SYSTEMS:  Diarrhea, urinary frequency.  Denies dysuria. Reports  weight loss.  No nausea, vomiting, fever, chills.  No abdominal pain.  Hyperpigmentation around the radiation site associated with the left trocar incision.  No vaginal bleeding.  Otherwise 10-point review of systems is negative.  PHYSICAL EXAMINATION:  GENERAL:  Well-developed female in no acute distress. VITAL SIGNS:  Weight 157 pounds, blood pressure 128/68, pulse of 72, respiratory rate of 20. CHEST:  Clear to auscultation. BACK:  No CVA tenderness. ABDOMEN:  Soft, nontender.  No palpable masses.  Hyperpigmentation around the port site, just left lateral to the umbilical. PELVIC:  Normal external genitalia, Bartholin's, urethral and Skene's. The vaginal mass is no longer visible.  IMPRESSION:  Recurrent endometrial cancer.  [name] is now approximately 3 weeks since completion of her therapy on September 28, 2010. I have asked her to follow up in 3 months and shared with her about how delighted I am with the above response.     Laurette Schimke, MD     WB/MEDQ  D:  10/21/2010  T:  10/21/2010  Job:  [id number]  cc:   Reece Packer, M.D. Fax: [phone]  Telford Nab, R.N. [address] [place], [place] [address]  Billie Lade, Ph.D., M.D. Fax: [phone]  Gaspar Garbe, M.D. Fax: [phone]    Electronically Signed by Laurette Schimke MD on 10/22/2010 08:02:54 AM

## 2010-11-02 ENCOUNTER — Ambulatory Visit
Admission: RE | Admit: 2010-11-02 | Discharge: 2010-11-02 | Disposition: A | Discharge status: Home / Self Care | Payer: Medicare Other | Source: Ambulatory Visit | Attending: Radiation Oncology | Admitting: Radiation Oncology

## 2011-02-03 ENCOUNTER — Ambulatory Visit: Payer: Medicare Other | Attending: Gynecologic Oncology | Admitting: Gynecologic Oncology

## 2011-02-03 ENCOUNTER — Ambulatory Visit (HOSPITAL_COMMUNITY)
Admission: RE | Admit: 2011-02-03 | Discharge: 2011-02-03 | Disposition: A | Discharge status: Home / Self Care | Payer: Medicare Other | Source: Ambulatory Visit | Attending: Gynecologic Oncology | Admitting: Gynecologic Oncology

## 2011-02-03 DIAGNOSIS — Z90710 Acquired absence of both cervix and uterus: Secondary | ICD-10-CM | POA: Insufficient documentation

## 2011-02-03 DIAGNOSIS — M7989 Other specified soft tissue disorders: Secondary | ICD-10-CM | POA: Insufficient documentation

## 2011-02-03 DIAGNOSIS — I10 Essential (primary) hypertension: Secondary | ICD-10-CM | POA: Insufficient documentation

## 2011-02-03 DIAGNOSIS — Z9079 Acquired absence of other genital organ(s): Secondary | ICD-10-CM | POA: Insufficient documentation

## 2011-02-03 DIAGNOSIS — C549 Malignant neoplasm of corpus uteri, unspecified: Secondary | ICD-10-CM | POA: Insufficient documentation

## 2011-02-03 DIAGNOSIS — C7982 Secondary malignant neoplasm of genital organs: Secondary | ICD-10-CM | POA: Insufficient documentation

## 2011-02-03 DIAGNOSIS — E7800 Pure hypercholesterolemia, unspecified: Secondary | ICD-10-CM | POA: Insufficient documentation

## 2011-02-03 DIAGNOSIS — Z9221 Personal history of antineoplastic chemotherapy: Secondary | ICD-10-CM | POA: Insufficient documentation

## 2011-02-03 DIAGNOSIS — R609 Edema, unspecified: Secondary | ICD-10-CM | POA: Insufficient documentation

## 2011-02-04 NOTE — Consult Note (Signed)
NAMEDAISEY, CALOCA NO.:  [id number]  MEDICAL RECORD NO.:  [id number]  LOCATION:  GYN                          FACILITY:  [facility]  PHYSICIAN:  Laurette Schimke, MD     DATE OF BIRTH:  [birth year]  DATE OF CONSULTATION: DATE OF DISCHARGE:                                CONSULTATION   REASON FOR VISIT:  Surveillance for recurrent endometrial cancer.  HISTORY OF PRESENT ILLNESS:  This is a 70 year old, who underwent a robotic-assisted laparoscopic hysterectomy, bilateral salpingo- oophorectomy, lymph node dissection in July, 2010.  Pathology was consistent with a stage III endometrial cancer with microscopic metastases to the adnexa.  She was enrolled in GOG protocol 258 and was randomized to receive 6 cycles of Taxol and carboplatin.  Posttreatment CT scan in January 2008.  At the end of 2011, on routine physical examination, she was found to have vaginal recurrence.  CT imaging at that time confirmed the presence of an obturator mass at the pelvic port site in the left upper quadrant and enlarged obturator nodes.  She received 6 cycles of carboplatin and Taxol therapy.  Imaging demonstrated residual left obturator and inguinal adenopathy and residual soft tissue mass and there was still residual disease in the vagina.  She subsequently underwent external beam pelvic radiotherapy along with vaginal brachytherapy and treatment of the port sites. Treatment was completed in June, 2012.  PAST MEDICAL HISTORY: 1. Recurrent endometrial cancer. 2. Hypertension. 3. Hypercholesterolemia.  PAST SURGICAL HISTORY: 1. Robotic-assisted laparoscopic total hysterectomy. 2. Bilateral salpingo-oophorectomy. 3. Bilateral pelvic lymph node dissection.  SOCIAL HISTORY:  The patient's daughter lives in [place].  The patient has good support in this area.  Denies tobacco use.  REVIEW OF SYSTEMS:  Reports edema of the left lower extremity for several weeks.  No shortness of  breath.  No abdominal pain, nausea, vomiting.  No vaginal bleeding.  She reports changes in her bladder and rectal habits since radiotherapy; however, it is manageable.  Otherwise, 10-point review of systems is negative.  PHYSICAL EXAMINATION:  VITAL SIGNS:  Blood pressure 122/68, pulse 72, respiratory rate of 18, temperature 97.9. CHEST:  Clear to auscultation. HEART:  Regular rate and rhythm. ABDOMEN:  Soft, nontender, obese.  No palpable lesions at the port sites.  No evidence of hernia.  No masses.  No omental cake.  No fluid wave.  PELVIC:  Normal external genitalia.  Her vagina is atrophic.  No lesions visualized or palpated.  No cul-de-sac masses or rectovaginal septum nodularity. Rectal:  Anal sphincter without any masses.  IMPRESSION: 1. Endometrial cancer.  There is no evidence of recurrent disease.     Ms. Cartwright has been advised to follow up with Dr. Roselind Messier in     January, 2013 with GYN Oncology in April, 2013. 2. 2+ edema of the left lower extremity which is greater than that of     the contralateral side.  Venous Doppler of the left lower extremity     has been ordered (this returned negative for DVT).     Laurette Schimke, MD     WB/MEDQ  D:  02/03/2011  T:  02/03/2011  Job:  [id number]  cc:   Fayrene Fearing  Carley Hammed, Ph.D., M.D. Fax: [phone]  Reece Packer, M.D. Fax: [phone]  Gaspar Garbe, M.D. Fax: [phone]  Telford Nab, R.N. [address] [place], [place] [address]  Electronically Signed by Laurette Schimke MD on 02/04/2011 12:08:18 PM

## 2011-05-02 ENCOUNTER — Ambulatory Visit: Payer: Medicare Other | Admitting: Radiation Oncology

## 2011-05-16 ENCOUNTER — Ambulatory Visit
Admission: RE | Admit: 2011-05-16 | Discharge: 2011-05-16 | Disposition: A | Discharge status: Home / Self Care | Payer: Medicare Other | Source: Ambulatory Visit | Attending: Radiation Oncology | Admitting: Radiation Oncology

## 2011-05-16 ENCOUNTER — Other Ambulatory Visit (HOSPITAL_COMMUNITY)
Admission: RE | Admit: 2011-05-16 | Discharge: 2011-05-16 | Disposition: A | Discharge status: Home / Self Care | Payer: Medicare Other | Source: Ambulatory Visit | Attending: Radiation Oncology | Admitting: Radiation Oncology

## 2011-05-16 VITALS — BP 136/84 | HR 79 | Temp 98.0°F | Ht 65.0 in | Wt 162.6 lb

## 2011-05-16 DIAGNOSIS — Z124 Encounter for screening for malignant neoplasm of cervix: Secondary | ICD-10-CM | POA: Diagnosis not present

## 2011-05-16 DIAGNOSIS — C549 Malignant neoplasm of corpus uteri, unspecified: Secondary | ICD-10-CM

## 2011-05-16 NOTE — Progress Notes (Signed)
Encounter addended by: Billie Lade, MD on: 05/16/2011  9:59 AM<BR>     Documentation filed: Orders

## 2011-05-16 NOTE — Progress Notes (Signed)
CC:   Laurette Schimke, MD Reece Packer, M.D. Gaspar Garbe, M.D.  DIAGNOSIS:  Recurrent endometrial cancer.  INTERVAL SINCE RADIATION THERAPY:  6 months.  NARRATIVE:  Ms. Million comes in today for routine followup.  She clinically seems to be doing quite well at this time.  The patient did see Dr. Nelly Rout in the fall and received a good report.  The patient denies any vaginal bleeding or urination difficulties.  The patient denies any rectal bleeding or bowel complaints.  She has very infrequent diarrhea at this time, approximately once every 2 weeks and relates this to particular foods, in particular peanuts.  The patient denies any nausea or abdominal pain or cramping.  PHYSICAL EXAMINATION:  Vital Signs:  The patient's temperature is 98.0, blood pressure is 136/84, pulse 79, weight is 162.6 pounds.  Lymph: Examination of the neck and supraclavicular region reveals no evidence of adenopathy.  The axillary areas are free of adenopathy.  Lungs: Examination of the lungs reveals them to be clear.  Heart:  Has a regular rhythm and rate.  Abdomen:  Soft and nontender with normal bowel sounds.  There is no obvious hepatosplenomegaly.  The patient has hyperpigmentation changes in the prior electron portal for her subcutaneous metastasis.  There is no palpable mass in this area.  The inguinal areas are free of adenopathy.  Pelvic Examination:  The external genitalia are unremarkable.  A speculum exam is performed. There are some radiation changes noted in the proximal vagina, but no visible lesion at this time.  A Pap smear is obtained of the proximal vagina.  On bimanual and rectovaginal examination, there are no pelvic masses appreciated.  IMPRESSION AND PLAN:  Clinically no evident disease, Pap smear pending. The patient will return for routine followup in 6 months and in the interim will see Dr. Nelly Rout in April.  I anticipate the patient may have CT scans later this  spring or early summer.  I should mention that the patient is using her vaginal dilator as recommended.    ______________________________ Billie Lade, Ph.D., M.D. JDK/MEDQ  D:  05/16/2011  T:  05/16/2011  Job:  [id number]

## 2011-05-16 NOTE — Progress Notes (Signed)
Here for follow up radiation treatments for recurrent endometrial ca.last seen by Dr.Brwewster in Oct. 2012. Getting strength back post radiation and chemotherapy. Denies any side effects except some mild weight gain and fatigue.

## 2011-05-19 ENCOUNTER — Telehealth: Payer: Self-pay

## 2011-05-19 NOTE — Telephone Encounter (Signed)
Patient notified of pap smear results from 05/16/11 are negative for intraepithelial lesions or malignacy.

## 2011-08-01 DIAGNOSIS — M4120 Other idiopathic scoliosis, site unspecified: Secondary | ICD-10-CM | POA: Diagnosis not present

## 2011-08-01 DIAGNOSIS — M533 Sacrococcygeal disorders, not elsewhere classified: Secondary | ICD-10-CM | POA: Diagnosis not present

## 2011-08-01 DIAGNOSIS — M545 Low back pain: Secondary | ICD-10-CM | POA: Diagnosis not present

## 2011-08-01 DIAGNOSIS — S335XXA Sprain of ligaments of lumbar spine, initial encounter: Secondary | ICD-10-CM | POA: Diagnosis not present

## 2011-08-09 ENCOUNTER — Encounter: Payer: Self-pay | Admitting: Gynecologic Oncology

## 2011-08-09 ENCOUNTER — Ambulatory Visit: Payer: Medicare Other | Attending: Gynecologic Oncology | Admitting: Gynecologic Oncology

## 2011-08-09 VITALS — BP 128/70 | HR 82 | Temp 98.4°F | Resp 16 | Ht 64.65 in | Wt 160.9 lb

## 2011-08-09 DIAGNOSIS — Z9079 Acquired absence of other genital organ(s): Secondary | ICD-10-CM | POA: Insufficient documentation

## 2011-08-09 DIAGNOSIS — I10 Essential (primary) hypertension: Secondary | ICD-10-CM | POA: Diagnosis not present

## 2011-08-09 DIAGNOSIS — Z90710 Acquired absence of both cervix and uterus: Secondary | ICD-10-CM | POA: Diagnosis not present

## 2011-08-09 DIAGNOSIS — R82998 Other abnormal findings in urine: Secondary | ICD-10-CM | POA: Diagnosis not present

## 2011-08-09 DIAGNOSIS — E785 Hyperlipidemia, unspecified: Secondary | ICD-10-CM | POA: Diagnosis not present

## 2011-08-09 DIAGNOSIS — C549 Malignant neoplasm of corpus uteri, unspecified: Secondary | ICD-10-CM | POA: Insufficient documentation

## 2011-08-09 DIAGNOSIS — E7800 Pure hypercholesterolemia, unspecified: Secondary | ICD-10-CM | POA: Diagnosis not present

## 2011-08-09 DIAGNOSIS — C569 Malignant neoplasm of unspecified ovary: Secondary | ICD-10-CM

## 2011-08-09 DIAGNOSIS — M1990 Unspecified osteoarthritis, unspecified site: Secondary | ICD-10-CM | POA: Diagnosis not present

## 2011-08-09 NOTE — Progress Notes (Signed)
REASON FOR VISIT: Surveillance for recurrent endometrial cancer.   HISTORY OF PRESENT ILLNESS: This is a 71 year old, who underwent a  robotic-assisted laparoscopic hysterectomy, bilateral salpingo-  oophorectomy, lymph node dissection in July, 2010. Pathology was  consistent with a stage III endometrial cancer with microscopic  metastases to the adnexa. She was enrolled in GOG protocol 258 and was  randomized to receive 6 cycles of Taxol and carboplatin. Posttreatment  CT scan in January 2011 was WNl. At the end of 2011, on routine physical  examination, she was found to have vaginal recurrence. CT imaging at  that time confirmed the presence of an obturator mass at the pelvic port  site in the left upper quadrant and enlarged obturator nodes. She  received 6 cycles of carboplatin and Taxol therapy. Imaging  demonstrated residual left obturator and inguinal adenopathy and  residual soft tissue mass and there was still residual disease in the  vagina. She subsequently underwent external beam pelvic radiotherapy  along with vaginal brachytherapy and treatment of the port sites.  Treatment was completed in June, 2012.   Reports lower back pain.  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  Currently receiving prednisone with relief of discomfort.  Past Medical History  Diagnosis Date  . Status post chemotherapy     carboplatin/paclitaxel x 6 rounds  . Hypercholesterolemia   . History of colonic polyps   . Bursitis of shoulder, left   . Cancer     Endometrial ca/Recurrence  . S/P radiation therapy July 29, 2010-Sep 06, 2010    External beam of pelvis  . S/P radiation therapy 09/15/10, 09/24/10, 09/28/2010    Intracavitary brachytherapy  . Lumbar pain   . Hypercholesteremia   . Allergy   . Hypertension    Past Surgical History  Procedure Date  . Abdominal hysterectomy   . Robotic assisted laparoscopic vaginal hysterectomy with fibroid removal July 2010    bilat.  salpingo-oophorectomy  . Breast lumpectomy 1993    Left breast, benign   SOCIAL HISTORY: The patient's daughter lives in [place]. The patient  has good support in this area. Denies tobacco use.   REVIEW OF SYSTEMS: Reports lower back pain. No shortness of breath. No abdominal pain, nausea,  vomiting. No vaginal bleeding. She reports changes in her bladder and  rectal habits since radiotherapy; however, it is manageable. Otherwise,  10-point review of systems is negative.   PHYSICAL EXAMINATION:  WD female in NAD  VITAL SIGNS: BP 128/70  Pulse 82  Temp(Src) 98.4 F (36.9 C) (Oral)  Resp 16  Ht 5' 4.65" (1.642 m)  Wt 160 lb 14.4 oz (72.984 kg)  BMI 27.07 kg/m2 CHEST: Clear to auscultation.  HEART: Regular rate and rhythm.  ABDOMEN: Soft, nontender, obese. No palpable lesions at the port  sites. No evidence of hernia. No masses. No omental cake. No fluid  wave.  BACK  No CVAT LN.  No axillary cervical or supraclavicular adenopathy  PELVIC: Normal external genitalia. Her vagina is atrophic. No  lesions visualized or palpated. No cul-de-sac masses or rectovaginal  septum nodularity.  Rectal: Good anal sphincter without any masses.   ASSESSMENT/PLAN This is a 71 year-old with stage IIIA grade 1endometrial cancer treated on GOG protocol 258.  He was randomized to the arm that received 6 cycles of Taxol carboplatin therapy. Recurrent disease was identified at a laparoscopic port site in the vagina in 2011.  He received additional Taxol carboplatin therapy and adjuvant external beam and vaginal brachytherapy. This treatment  was completed in June 2012 and she's been without any evidence of disease since.  No evidence of recurrent disease on examination today.  Follow up with Dr. Roselind Messier 10/2011. F/U with Gyn Onc in 6 months If back pain worsens consider additional work-up.

## 2011-08-09 NOTE — Patient Instructions (Signed)
No evidence of recurrent disease on examination today.  Follow up with Dr. Roselind Messier 10/2011. F/U with Gyn Onc in 6 months If back pain worsens consider additional work-up.

## 2011-08-10 DIAGNOSIS — M545 Low back pain: Secondary | ICD-10-CM | POA: Diagnosis not present

## 2011-08-15 DIAGNOSIS — M545 Low back pain: Secondary | ICD-10-CM | POA: Diagnosis not present

## 2011-08-25 DIAGNOSIS — M545 Low back pain: Secondary | ICD-10-CM | POA: Diagnosis not present

## 2011-08-29 DIAGNOSIS — M545 Low back pain: Secondary | ICD-10-CM | POA: Diagnosis not present

## 2011-09-01 DIAGNOSIS — M545 Low back pain: Secondary | ICD-10-CM | POA: Diagnosis not present

## 2011-09-22 DIAGNOSIS — M545 Low back pain: Secondary | ICD-10-CM | POA: Diagnosis not present

## 2011-09-27 DIAGNOSIS — M545 Low back pain: Secondary | ICD-10-CM | POA: Diagnosis not present

## 2011-10-22 ENCOUNTER — Encounter (HOSPITAL_BASED_OUTPATIENT_CLINIC_OR_DEPARTMENT_OTHER): Payer: Self-pay

## 2011-10-22 ENCOUNTER — Emergency Department (HOSPITAL_BASED_OUTPATIENT_CLINIC_OR_DEPARTMENT_OTHER)
Admission: EM | Admit: 2011-10-22 | Discharge: 2011-10-22 | Disposition: A | Discharge status: Home Health | Payer: Medicare Other | Attending: Emergency Medicine | Admitting: Emergency Medicine

## 2011-10-22 DIAGNOSIS — N390 Urinary tract infection, site not specified: Secondary | ICD-10-CM | POA: Diagnosis not present

## 2011-10-22 DIAGNOSIS — I10 Essential (primary) hypertension: Secondary | ICD-10-CM | POA: Diagnosis not present

## 2011-10-22 DIAGNOSIS — Z79899 Other long term (current) drug therapy: Secondary | ICD-10-CM | POA: Diagnosis not present

## 2011-10-22 DIAGNOSIS — Z888 Allergy status to other drugs, medicaments and biological substances status: Secondary | ICD-10-CM | POA: Diagnosis not present

## 2011-10-22 DIAGNOSIS — Z7982 Long term (current) use of aspirin: Secondary | ICD-10-CM | POA: Insufficient documentation

## 2011-10-22 DIAGNOSIS — E7800 Pure hypercholesterolemia, unspecified: Secondary | ICD-10-CM | POA: Insufficient documentation

## 2011-10-22 DIAGNOSIS — M545 Low back pain, unspecified: Secondary | ICD-10-CM | POA: Insufficient documentation

## 2011-10-22 DIAGNOSIS — Z8542 Personal history of malignant neoplasm of other parts of uterus: Secondary | ICD-10-CM | POA: Insufficient documentation

## 2011-10-22 DIAGNOSIS — Z87891 Personal history of nicotine dependence: Secondary | ICD-10-CM | POA: Insufficient documentation

## 2011-10-22 LAB — URINE MICROSCOPIC-ADD ON

## 2011-10-22 LAB — URINALYSIS, ROUTINE W REFLEX MICROSCOPIC
Glucose, UA: NEGATIVE mg/dL
Ketones, ur: NEGATIVE mg/dL
Protein, ur: 30 mg/dL — AB
Urobilinogen, UA: 0.2 mg/dL (ref 0.0–1.0)

## 2011-10-22 MED ORDER — SULFAMETHOXAZOLE-TRIMETHOPRIM 800-160 MG PO TABS: 1.0000 | ORAL_TABLET | Freq: Two times a day (BID) | ORAL | Status: AC

## 2011-10-22 MED ORDER — SULFAMETHOXAZOLE-TMP DS 800-160 MG PO TABS
1.0000 | ORAL_TABLET | Freq: Two times a day (BID) | ORAL | Status: DC
  Administered 2011-10-22: 1 via ORAL
  Filled 2011-10-22: qty 1

## 2011-10-22 NOTE — ED Provider Notes (Signed)
History     CSN: [id number]  Arrival date & time 10/22/11  1206   First MD Initiated Contact with Patient 10/22/11 1215      Chief Complaint  Patient presents with  . Urinary Tract Infection    (Consider location/radiation/quality/duration/timing/severity/associated sxs/prior treatment) HPI Comments: Pt c/o burning frequency and hematuria   Patient is a 71 y.o. female presenting with urinary tract infection. The history is provided by the patient. No language interpreter was used.  Urinary Tract Infection This is a new problem. The current episode started in the past 7 days. The problem occurs constantly. The problem has been unchanged. Associated symptoms include urinary symptoms. Pertinent negatives include no abdominal pain, fever or nausea. Nothing aggravates the symptoms. She has tried nothing for the symptoms.    Past Medical History  Diagnosis Date  . Status post chemotherapy     carboplatin/paclitaxel x 6 rounds  . Hypercholesterolemia   . History of colonic polyps   . Bursitis of shoulder, left   . Cancer     Endometrial ca/Recurrence  . S/P radiation therapy July 29, 2010-Sep 06, 2010    External beam of pelvis  . S/P radiation therapy 09/15/10, 09/24/10, 09/28/2010    Intracavitary brachytherapy  . Lumbar pain   . Hypercholesteremia   . Allergy   . Hypertension     Past Surgical History  Procedure Date  . Abdominal hysterectomy   . Robotic assisted laparoscopic vaginal hysterectomy with fibroid removal July 2010    bilat. salpingo-oophorectomy  . Breast lumpectomy 1993    Left breast, benign    Family History  Problem Relation Age of Onset  . Breast cancer Mother   . Heart disease Father   . Breast cancer Sister   . Diabetes Sister     History  Substance Use Topics  . Smoking status: Former Smoker -- 1.0 packs/day    Types: Cigarettes    Quit date: 05/12/1976  . Smokeless tobacco: Not on file  . Alcohol Use: No    OB History    Grav Para Term  Preterm Abortions TAB SAB Ect Mult Living                  Review of Systems  Constitutional: Negative for fever.  Respiratory: Negative.   Cardiovascular: Negative.   Gastrointestinal: Negative for nausea and abdominal pain.  Neurological: Negative.     Allergies  Phenobarbital  Home Medications   Current Outpatient Rx  Name Route Sig Dispense Refill  . ASPIRIN 325 MG PO TABS Oral Take 325 mg by mouth daily.    Marland Kitchen BENAZEPRIL HCL 20 MG PO TABS Oral Take 20 mg by mouth daily.    Marland Kitchen CALCIUM CARBONATE 600 MG PO TABS Oral Take 600 mg by mouth 2 (two) times daily with a meal. With vitamin d    . VITAMIN D 1000 UNITS PO TABS Oral Take 1,000 Units by mouth daily.    . CYCLOBENZAPRINE HCL 10 MG PO TABS Oral Take 10 mg by mouth.    . DICLOFENAC SODIUM 1 % TD GEL Topical Apply topically 2 (two) times daily as needed.    Marland Kitchen DOCUSATE SODIUM 100 MG PO CAPS Oral Take 100 mg by mouth 2 (two) times daily as needed.    Marland Kitchen FEXOFENADINE HCL 60 MG PO TABS Oral Take 60 mg by mouth daily.    . OMEGA-3 FATTY ACIDS 1000 MG PO CAPS Oral Take 1,200 mg by mouth daily.    Marland Kitchen FOLIC  ACID-VIT B6-VIT B12 2.5-25-1 MG PO TABS Oral Take 1 tablet by mouth daily.    Marland Kitchen MECLIZINE HCL 25 MG PO TABS Oral Take 25 mg by mouth 3 (three) times daily as needed.    Marland Kitchen ONE-DAILY MULTI VITAMINS PO TABS Oral Take 1 tablet by mouth daily. Centrum silver    . RALOXIFENE HCL 60 MG PO TABS Oral Take 60 mg by mouth daily.    Marland Kitchen SIMVASTATIN 40 MG PO TABS Oral Take 40 mg by mouth every evening.      BP 154/94  Pulse 109  Temp 98.5 F (36.9 C) (Oral)  Resp 16  Ht 5\' 6"  (1.676 m)  Wt 160 lb (72.576 kg)  BMI 25.82 kg/m2  SpO2 98%  Physical Exam  Nursing note and vitals reviewed. Constitutional: She is oriented to person, place, and time. She appears well-developed and well-nourished.  HENT:  Head: Normocephalic and atraumatic.  Neck: Neck supple.  Cardiovascular: Normal rate and regular rhythm.   Pulmonary/Chest: Effort normal  and breath sounds normal.  Abdominal: Soft. Bowel sounds are normal. There is no tenderness.  Musculoskeletal: Normal range of motion.  Neurological: She is alert and oriented to person, place, and time.  Skin: Skin is warm and dry.  Psychiatric: She has a normal mood and affect.    ED Course  Procedures (including critical care time)  Labs Reviewed  URINALYSIS, ROUTINE W REFLEX MICROSCOPIC - Abnormal; Notable for the following:    APPearance CLOUDY (*)     Hgb urine dipstick LARGE (*)     Protein, ur 30 (*)     Nitrite POSITIVE (*)     Leukocytes, UA LARGE (*)     All other components within normal limits  URINE MICROSCOPIC-ADD ON - Abnormal; Notable for the following:    Squamous Epithelial / LPF FEW (*)     Bacteria, UA MANY (*)     All other components within normal limits  URINE CULTURE   No results found.   1. UTI (lower urinary tract infection)       MDM  Will treat for a simple ZOX:WRUEAVW sent:pt vitals stable:pt is able to go home        Teressa Lower, NP 10/22/11 1322

## 2011-10-22 NOTE — ED Notes (Signed)
Pt c/o dysuria, hematuria and increased frequency.  Pt states onset of SX yesterday. Pt denies fever.

## 2011-10-22 NOTE — Discharge Instructions (Signed)
Urinary Tract Infection °Infections of the urinary tract can start in several places. A bladder infection (cystitis), a kidney infection (pyelonephritis), and a prostate infection (prostatitis) are different types of urinary tract infections (UTIs). They usually get better if treated with medicines (antibiotics) that kill germs. Take all the medicine until it is gone. You or your child may feel better in a few days, but TAKE ALL MEDICINE or the infection may not respond and may become more difficult to treat. °HOME CARE INSTRUCTIONS  °· Drink enough water and fluids to keep the urine clear or pale yellow. Cranberry juice is especially recommended, in addition to large amounts of water.  °· Avoid caffeine, tea, and carbonated beverages. They tend to irritate the bladder.  °· Alcohol may irritate the prostate.  °· Only take over-the-counter or prescription medicines for pain, discomfort, or fever as directed by your caregiver.  °To prevent further infections: °· Empty the bladder often. Avoid holding urine for long periods of time.  °· After a bowel movement, women should cleanse from front to back. Use each tissue only once.  °· Empty the bladder before and after sexual intercourse.  °FINDING OUT THE RESULTS OF YOUR TEST °Not all test results are available during your visit. If your or your child's test results are not back during the visit, make an appointment with your caregiver to find out the results. Do not assume everything is normal if you have not heard from your caregiver or the medical facility. It is important for you to follow up on all test results. °SEEK MEDICAL CARE IF:  °· There is back pain.  °· Your baby is older than 3 months with a rectal temperature of 100.5° F (38.1° C) or higher for more than 1 day.  °· Your or your child's problems (symptoms) are no better in 3 days. Return sooner if you or your child is getting worse.  °SEEK IMMEDIATE MEDICAL CARE IF:  °· There is severe back pain or lower  abdominal pain.  °· You or your child develops chills.  °· You have a fever.  °· Your baby is older than 3 months with a rectal temperature of 102° F (38.9° C) or higher.  °· Your baby is 3 months old or younger with a rectal temperature of 100.4° F (38° C) or higher.  °· There is nausea or vomiting.  °· There is continued burning or discomfort with urination.  °MAKE SURE YOU:  °· Understand these instructions.  °· Will watch your condition.  °· Will get help right away if you are not doing well or get worse.  °Document Released: 01/19/2005 Document Revised: 03/31/2011 Document Reviewed: 08/24/2006 °ExitCare® Patient Information ©2012 [employer].Urine Culture Collection, Female ° You will collect a sample of pee (urine) in a cup. Read the instructions below before beginning. If you have any questions, ask the nurse before you begin. Follow the instructions carefully. °1. Wash your hands with soap and water and dry them thoroughly.  °2. Open the lid of the cup. Be careful not to touch the inside.  °3. Clean the private (genital) area. °1. Sit over the toilet. Use the fingers of one hand to separate and hold open the folds of the skin in your private area.  °2. Clean the pee (urinary) opening and surrounding area with the gauze, wiping from front to back. Throw away the gauze in the trash, not the toilet.  °3. Repeat step "b"2 more times.  °4. With the folds of   skin still separated, pee a small amount into toilet. STOP, then pee into the cup. Fill the cup half way.  °5. Put the lid on the cup tightly.  °6. Wash your hands with soap and water.  °7. If you were given a label, put the label on the cup.  °8. Give the cup to the nurse.  °Document Released: 03/24/2008 Document Revised: 03/31/2011 Document Reviewed: 03/24/2008 °ExitCare® Patient Information ©2012 [employer]. °

## 2011-10-24 ENCOUNTER — Ambulatory Visit
Admission: RE | Admit: 2011-10-24 | Discharge: 2011-10-24 | Payer: Medicare Other | Source: Ambulatory Visit | Attending: Radiation Oncology | Admitting: Radiation Oncology

## 2011-10-24 LAB — URINE CULTURE: Colony Count: >=100000

## 2011-10-25 NOTE — ED Notes (Signed)
+   Urine Patient treated with Septra-sensitive to same-chart appended per protocol MD. 

## 2011-10-26 ENCOUNTER — Encounter: Payer: Self-pay | Admitting: Radiation Oncology

## 2011-10-26 ENCOUNTER — Other Ambulatory Visit (HOSPITAL_COMMUNITY)
Admission: RE | Admit: 2011-10-26 | Discharge: 2011-10-26 | Disposition: A | Discharge status: Home / Self Care | Payer: Medicare Other | Source: Ambulatory Visit | Attending: Radiation Oncology | Admitting: Radiation Oncology

## 2011-10-26 ENCOUNTER — Ambulatory Visit
Admission: RE | Admit: 2011-10-26 | Discharge: 2011-10-26 | Disposition: A | Discharge status: Home / Self Care | Payer: Medicare Other | Source: Ambulatory Visit | Attending: Radiation Oncology | Admitting: Radiation Oncology

## 2011-10-26 VITALS — BP 154/84 | HR 98 | Temp 97.8°F | Resp 20 | Wt 158.7 lb

## 2011-10-26 DIAGNOSIS — C549 Malignant neoplasm of corpus uteri, unspecified: Secondary | ICD-10-CM

## 2011-10-26 DIAGNOSIS — Z124 Encounter for screening for malignant neoplasm of cervix: Secondary | ICD-10-CM | POA: Diagnosis not present

## 2011-10-26 DIAGNOSIS — C539 Malignant neoplasm of cervix uteri, unspecified: Secondary | ICD-10-CM | POA: Diagnosis not present

## 2011-10-26 NOTE — Progress Notes (Signed)
Pt taking Septra for UTI, 2 days of med left. She states bringing is gone, but has sensation. Pt denies vaginal discharge, issues w/BM's, fatigue, loss of appetite. Using vaginal dilator sporadically; advised she use regularly, at least twice/week.

## 2011-10-26 NOTE — ED Provider Notes (Signed)
Medical screening examination/treatment/procedure(s) were performed by non-physician practitioner and as supervising physician I was immediately available for consultation/collaboration.  Amisha Pospisil, MD 10/26/11 1332 

## 2011-10-26 NOTE — Progress Notes (Signed)
Radiation Oncology         [phone]) [phone] ________________________________  Name: [name] MRN: [id number]  Date: 10/26/2011  DOB: 1941/04/09  Follow-Up Visit Note  CC: Gaspar Garbe, MD  Thompson Caul., MD  Diagnosis:   Recurrent endometrial cancer  Interval Since Last Radiation:  13 months the patient was treated with external beam and intracavitary brachytherapy treatments.  Narrative:  The patient returns today for routine follow-up.  She seems to be doing quite well except for development of a urinary tract infection several days ago. Patient was seen in primary care and was placed on Septra which is improved her symptoms. She denies any vaginal bleeding pelvic pain, urination difficulties. Patient denies any problems with diarrhea at this time.                              ALLERGIES:  is allergic to phenobarbital.  Meds: Current Outpatient Prescriptions  Medication Sig Dispense Refill  . aspirin 325 MG tablet Take 325 mg by mouth daily.      . benazepril (LOTENSIN) 20 MG tablet Take 20 mg by mouth daily.      . calcium carbonate (OS-CAL) 600 MG TABS Take 600 mg by mouth 2 (two) times daily with a meal. With vitamin d      . cholecalciferol (VITAMIN D) 1000 UNITS tablet Take 1,000 Units by mouth daily.      . cyclobenzaprine (FLEXERIL) 10 MG tablet Take 10 mg by mouth.      . diclofenac sodium (VOLTAREN) 1 % GEL Apply topically 2 (two) times daily as needed.      . docusate sodium (COLACE) 100 MG capsule Take 100 mg by mouth 2 (two) times daily as needed.      . fexofenadine (ALLEGRA) 60 MG tablet Take 60 mg by mouth daily.      . fish oil-omega-3 fatty acids 1000 MG capsule Take 1,200 mg by mouth daily.      . Folic Acid-Vit B6-Vit B12 (FOLBEE) 2.5-25-1 MG TABS Take 1 tablet by mouth daily.      . meclizine (ANTIVERT) 25 MG tablet Take 25 mg by mouth 3 (three) times daily as needed.      . Multiple Vitamin (MULTIVITAMIN) tablet Take 1 tablet by mouth daily. Centrum  silver      . raloxifene (EVISTA) 60 MG tablet Take 60 mg by mouth daily.      . simvastatin (ZOCOR) 40 MG tablet Take 40 mg by mouth every evening.      . sulfamethoxazole-trimethoprim (SEPTRA DS) 800-160 MG per tablet Take 1 tablet by mouth every 12 (twelve) hours.  10 tablet  0    Physical Findings: The patient is in no acute distress. Patient is alert and oriented.  weight is 158 lb 11.2 oz (71.986 kg). Her oral temperature is 97.8 F (36.6 C). Her blood pressure is 154/84 and her pulse is 98. Her respiration is 20. Marland Kitchen  No palpable cervical or supraclavicular adenopathy. The lungs are clear to auscultation. The heart has a regular rhythm and rate. The abdomen is soft and nontender with normal bowel sounds. There is no inguinal adenopathy appreciated. Patient has some hyperpigmentation changes from her prior radiation field directed at the subcutaneous recurrence. There's no palpable nodule in this area. On pelvic examination the external genitalia are unremarkable. A speculum exam is performed. This radiation changes noted in the proximal vagina. There no mucosal lesions noted. A  Pap smear was obtained of the proximal vagina.  On bimanual and rectovaginal examination there no pelvic masses appreciated.  Lab Findings: Lab Results  Component Value Date   WBC 6.0 06/09/2010   HGB 11.8 06/09/2010   HCT 34.8 06/09/2010   MCV 96.1 06/09/2010   PLT 223 06/09/2010    @LASTCHEM @  Radiographic Findings: No results found.  Impression:  The patient is recovering from the effects of radiation.  No evidence of recurrence on exam today, Pap smear pending.  Plan:  Routine followup in 6 months  _____________________________________   Billie Lade, PhD, MD

## 2011-11-01 ENCOUNTER — Telehealth: Payer: Self-pay | Admitting: Gynecologic Oncology

## 2011-11-01 NOTE — Telephone Encounter (Signed)
Pt notified about pap results: negative for intraepithelial lesions or malignancy.  No questions or concerns voiced.

## 2011-11-17 NOTE — Progress Notes (Signed)
Called patient and left voice mail that her PAP was normal

## 2012-01-07 DIAGNOSIS — Z1231 Encounter for screening mammogram for malignant neoplasm of breast: Secondary | ICD-10-CM | POA: Diagnosis not present

## 2012-01-07 DIAGNOSIS — Z803 Family history of malignant neoplasm of breast: Secondary | ICD-10-CM | POA: Diagnosis not present

## 2012-01-23 DIAGNOSIS — Z23 Encounter for immunization: Secondary | ICD-10-CM | POA: Diagnosis not present

## 2012-02-13 DIAGNOSIS — R7301 Impaired fasting glucose: Secondary | ICD-10-CM | POA: Diagnosis not present

## 2012-02-13 DIAGNOSIS — E785 Hyperlipidemia, unspecified: Secondary | ICD-10-CM | POA: Diagnosis not present

## 2012-02-13 DIAGNOSIS — I10 Essential (primary) hypertension: Secondary | ICD-10-CM | POA: Diagnosis not present

## 2012-02-14 ENCOUNTER — Ambulatory Visit: Payer: Medicare Other | Attending: Gynecologic Oncology | Admitting: Gynecologic Oncology

## 2012-02-14 ENCOUNTER — Encounter: Payer: Self-pay | Admitting: Gynecologic Oncology

## 2012-02-14 VITALS — BP 122/68 | HR 78 | Temp 98.5°F | Resp 16 | Ht 65.2 in | Wt 157.7 lb

## 2012-02-14 DIAGNOSIS — C549 Malignant neoplasm of corpus uteri, unspecified: Secondary | ICD-10-CM | POA: Insufficient documentation

## 2012-02-14 DIAGNOSIS — E7800 Pure hypercholesterolemia, unspecified: Secondary | ICD-10-CM | POA: Insufficient documentation

## 2012-02-14 DIAGNOSIS — Z9109 Other allergy status, other than to drugs and biological substances: Secondary | ICD-10-CM | POA: Diagnosis not present

## 2012-02-14 DIAGNOSIS — M545 Low back pain, unspecified: Secondary | ICD-10-CM | POA: Insufficient documentation

## 2012-02-14 DIAGNOSIS — Z923 Personal history of irradiation: Secondary | ICD-10-CM | POA: Insufficient documentation

## 2012-02-14 DIAGNOSIS — Z9221 Personal history of antineoplastic chemotherapy: Secondary | ICD-10-CM | POA: Diagnosis not present

## 2012-02-14 DIAGNOSIS — Z90710 Acquired absence of both cervix and uterus: Secondary | ICD-10-CM | POA: Diagnosis not present

## 2012-02-14 DIAGNOSIS — Z9079 Acquired absence of other genital organ(s): Secondary | ICD-10-CM | POA: Insufficient documentation

## 2012-02-14 DIAGNOSIS — I10 Essential (primary) hypertension: Secondary | ICD-10-CM | POA: Diagnosis not present

## 2012-02-14 DIAGNOSIS — C569 Malignant neoplasm of unspecified ovary: Secondary | ICD-10-CM

## 2012-02-14 NOTE — Progress Notes (Signed)
REASON FOR VISIT: Surveillance for recurrent endometrial cancer.   HISTORY OF PRESENT ILLNESS: This is a 71 year old, who underwent a robotic-assisted laparoscopic hysterectomy, bilateral salpingo-  oophorectomy, lymph node dissection in July, 2010. Pathology was consistent with a stage III endometrial cancer with microscopic  metastases to the adnexa. She was enrolled in GOG protocol 258 and was randomized to receive 6 cycles of Taxol and carboplatin. Posttreatment  CT scan in January 2011 was WNl. At the end of 2011, on routine physical examination, she was found to have vaginal recurrence. CT imaging at  that time confirmed the presence of an obturator mass at the pelvic port site in the left upper quadrant and enlarged obturator nodes. She  received 6 cycles of carboplatin and Taxol therapy. Imaging demonstrated residual left obturator and inguinal adenopathy and  residual soft tissue mass and there was still residual disease in the vagina. She subsequently underwent external beam pelvic radiotherapy  along with vaginal brachytherapy and treatment of the port sites. Treatment was completed in June, 2012.   Patient feels 'pretty good'.  Pap 10/2011 wnl  Past Medical History  Diagnosis Date  . Status post chemotherapy     carboplatin/paclitaxel x 6 rounds  . Hypercholesterolemia   . History of colonic polyps   . Bursitis of shoulder, left   . Cancer     Endometrial ca/Recurrence  . S/P radiation therapy July 29, 2010-Sep 06, 2010    External beam of pelvis  . S/P radiation therapy 09/15/10, 09/24/10, 09/28/2010    Intracavitary brachytherapy  . Lumbar pain   . Hypercholesteremia   . Allergy   . Hypertension    Past Surgical History  Procedure Date  . Abdominal hysterectomy   . Robotic assisted laparoscopic vaginal hysterectomy with fibroid removal July 2010    bilat. salpingo-oophorectomy  . Breast lumpectomy 1993    Left breast, benign   SOCIAL HISTORY: The patient's daughter  lives in [place]. The patient  has good support in this area. Denies tobacco use.  Mammogram 12/2011  REVIEW OF SYSTEMS: No cough, no hemoptysis, no shortness of breath. No chest pain.  Reports reflux.  No abdominal pain, nausea,  vomiting. No back pain, no adenopathy, No vaginal bleeding or discharge. Shedenies changes in her bladder and  rectal habits.  No hematuria or hematochezia.  No lower extremity edema. Otherwise,  10-point system review is negative.   PHYSICAL EXAMINATION:  WD female in NAD  VITAL SIGNS: BP 122/68  Pulse 78  Temp 98.5 F (36.9 C) (Oral)  Resp 16  Ht 5' 5.2" (1.656 m)  Wt 157 lb 11.2 oz (71.532 kg)  BMI 26.08 kg/m2 CHEST: Clear to auscultation.  HEART: Regular rate and rhythm.  ABDOMEN: Soft, nontender, obese. No palpable lesions at the port sites. No evidence of hernia. No masses. No omental cake. No fluid  wave.  BACK  No CVAT LN.  No axillary cervical or supraclavicular adenopathy PELVIC: Normal external genitalia. Her vagina is atrophic. No lesions visualized or palpated. No cul-de-sac masses or rectovaginal  septum nodularity.  Rectal: Good anal sphincter without any masses. EXT 1-2+ lower extremity edema bilaterally   ASSESSMENT/PLAN This is a 71 year-old with stage IIIA grade 1endometrial cancer treated on GOG protocol 258.  He was randomized to the arm that received 6 cycles of Taxol carboplatin therapy. Recurrent disease was identified at a laparoscopic port site in the vagina in 2011.  He received additional Taxol carboplatin therapy and adjuvant external beam and vaginal brachytherapy. This  treatment was completed in June 2012 and she's been without any evidence of disease since.  No evidence of recurrent disease on examination today.  Follow up with Dr. Roselind Messier 04/2012 F/U with Gyn Onc in 72 months

## 2012-02-14 NOTE — Patient Instructions (Signed)
  No evidence of recurrent disease on examination today.  Follow up with Dr. Roselind Messier 04/2012 F/U with Gyn Onc in 6 months   Thank you very much Ms. Eloina Etchison for allowing me to provide care for you today.  I appreciate your confidence in choosing our Gynecologic Oncology team.  If you have any questions about your visit today please call our office and we will get back to you as soon as possible.  Maryclare Labrador. Dlisa Barnwell MD., PhD Gynecologic Oncology

## 2012-02-20 DIAGNOSIS — Z23 Encounter for immunization: Secondary | ICD-10-CM | POA: Diagnosis not present

## 2012-02-20 DIAGNOSIS — M1990 Unspecified osteoarthritis, unspecified site: Secondary | ICD-10-CM | POA: Diagnosis not present

## 2012-02-20 DIAGNOSIS — Z1212 Encounter for screening for malignant neoplasm of rectum: Secondary | ICD-10-CM | POA: Diagnosis not present

## 2012-02-20 DIAGNOSIS — Z0000 Encounter for general adult medical examination without abnormal findings: Secondary | ICD-10-CM | POA: Diagnosis not present

## 2012-02-20 DIAGNOSIS — E785 Hyperlipidemia, unspecified: Secondary | ICD-10-CM | POA: Diagnosis not present

## 2012-02-20 DIAGNOSIS — I10 Essential (primary) hypertension: Secondary | ICD-10-CM | POA: Diagnosis not present

## 2012-03-07 DIAGNOSIS — Z79899 Other long term (current) drug therapy: Secondary | ICD-10-CM | POA: Diagnosis not present

## 2012-04-30 ENCOUNTER — Ambulatory Visit: Payer: Medicare Other | Admitting: Radiation Oncology

## 2012-04-30 ENCOUNTER — Other Ambulatory Visit (HOSPITAL_COMMUNITY)
Admission: RE | Admit: 2012-04-30 | Discharge: 2012-04-30 | Disposition: A | Discharge status: Home / Self Care | Payer: Medicare Other | Source: Ambulatory Visit | Attending: Radiation Oncology | Admitting: Radiation Oncology

## 2012-04-30 ENCOUNTER — Ambulatory Visit
Admission: RE | Admit: 2012-04-30 | Discharge: 2012-04-30 | Disposition: A | Discharge status: Home / Self Care | Payer: Medicare Other | Source: Ambulatory Visit | Attending: Radiation Oncology | Admitting: Radiation Oncology

## 2012-04-30 VITALS — BP 136/68 | HR 82 | Temp 98.2°F | Wt 157.3 lb

## 2012-04-30 DIAGNOSIS — Z124 Encounter for screening for malignant neoplasm of cervix: Secondary | ICD-10-CM | POA: Diagnosis not present

## 2012-04-30 DIAGNOSIS — C549 Malignant neoplasm of corpus uteri, unspecified: Secondary | ICD-10-CM

## 2012-04-30 NOTE — Progress Notes (Signed)
Patient here for routine follow up completion of radiation 10/27/2010 for endometrial cancer.Denies any female organ problems.No discharge.Main concern is pain of lower back to left leg.Will start back physical therapy exercises.Intermittent frequency of stools.No urinary problems.

## 2012-04-30 NOTE — Progress Notes (Signed)
Radiation Oncology         [phone]) [phone] ________________________________  Name: [name] MRN: [id number]  Date: 04/30/2012  DOB: Sep 14, 1940  Follow-Up Visit Note  CC: Gaspar Garbe, MD  Thompson Caul., MD  Diagnosis:   Recurrent endometrial cancer  Interval Since Last Radiation:  One year and 7 months. She completed external beam radiation therapy and intracavitary brachytherapy treatments.   she also had treatment to a subcutaneous recurrence in the lower left  abdominal area   Narrative:  The patient returns today for routine follow-up.  She clinically is doing well at this time. She denies any vaginal bleeding hematuria or rectal bleeding. She continues to use her vaginal dilator                              ALLERGIES:  is allergic to phenobarbital.  Meds: Current Outpatient Prescriptions  Medication Sig Dispense Refill  . aspirin 325 MG tablet Take 325 mg by mouth daily.      . benazepril (LOTENSIN) 20 MG tablet Take 20 mg by mouth daily.      . calcium carbonate (OS-CAL) 600 MG TABS Take 600 mg by mouth 2 (two) times daily with a meal. With vitamin d      . cholecalciferol (VITAMIN D) 1000 UNITS tablet Take 1,000 Units by mouth daily.      . diclofenac sodium (VOLTAREN) 1 % GEL Apply topically 2 (two) times daily as needed.      . docusate sodium (COLACE) 100 MG capsule Take 100 mg by mouth 2 (two) times daily as needed.      . fexofenadine (ALLEGRA) 60 MG tablet Take 60 mg by mouth daily.      . fish oil-omega-3 fatty acids 1000 MG capsule Take 1,200 mg by mouth daily.      . Folic Acid-Vit B6-Vit B12 (FOLBEE) 2.5-25-1 MG TABS Take 1 tablet by mouth daily.      Marland Kitchen ibuprofen (ADVIL,MOTRIN) 200 MG tablet Take 200 mg by mouth every 6 (six) hours as needed.      . meclizine (ANTIVERT) 25 MG tablet Take 25 mg by mouth 3 (three) times daily as needed.      . Multiple Vitamin (MULTIVITAMIN) tablet Take 1 tablet by mouth daily. Centrum silver      . raloxifene (EVISTA) 60 MG  tablet Take 60 mg by mouth daily.      . simvastatin (ZOCOR) 40 MG tablet Take 40 mg by mouth every evening.        Physical Findings: The patient is in no acute distress. Patient is alert and oriented.  weight is 157 lb 4.8 oz (71.351 kg). Her temperature is 98.2 F (36.8 C). Her blood pressure is 136/68 and her pulse is 82. Marland Kitchen No palpable supraclavicular or axillary adenopathy. The lungs are clear to auscultation. The heart has a regular rhythm and rate. The abdomen is soft and nontender with normal bowel sounds. There is no obvious hepatosplenomegaly. Patient has hyperpigmentation changes in the area was treated on the left abdominal region. There's no palpable mass in this area. The inguinal areas are free of adenopathy. On pelvic examination the external genitalia are unremarkable. A speculum exam is performed. There no mucosal lesions noted.  A Pap smear was obtained of the proximal vagina. On bimanual and rectovaginal examination there no pelvic masses appreciated.  Lab Findings: Lab Results  Component Value Date   WBC 6.0 06/09/2010  HGB 11.8 06/09/2010   HCT 34.8 06/09/2010   MCV 96.1 06/09/2010   PLT 223 06/09/2010      Radiographic Findings: No results found.  Impression:  No evidence of recurrence on clinical exam today, Pap smear pending  Plan:  Routine followup in 6 months. In the interim the patient will be seen by gynecologic oncology.  _____________________________________    Billie Lade, PhD, MD

## 2012-05-04 ENCOUNTER — Telehealth: Payer: Self-pay

## 2012-05-04 NOTE — Telephone Encounter (Signed)
Patient called and informed of pap smear results from 04/30/12 negative for intraepithelial lesions or malignancy.

## 2012-07-21 ENCOUNTER — Emergency Department (HOSPITAL_BASED_OUTPATIENT_CLINIC_OR_DEPARTMENT_OTHER)
Admission: EM | Admit: 2012-07-21 | Discharge: 2012-07-21 | Disposition: A | Discharge status: Home / Self Care | Payer: Medicare Other | Attending: Emergency Medicine | Admitting: Emergency Medicine

## 2012-07-21 ENCOUNTER — Encounter (HOSPITAL_BASED_OUTPATIENT_CLINIC_OR_DEPARTMENT_OTHER): Payer: Self-pay

## 2012-07-21 DIAGNOSIS — Z923 Personal history of irradiation: Secondary | ICD-10-CM | POA: Insufficient documentation

## 2012-07-21 DIAGNOSIS — J019 Acute sinusitis, unspecified: Secondary | ICD-10-CM | POA: Diagnosis not present

## 2012-07-21 DIAGNOSIS — I10 Essential (primary) hypertension: Secondary | ICD-10-CM | POA: Insufficient documentation

## 2012-07-21 DIAGNOSIS — F17200 Nicotine dependence, unspecified, uncomplicated: Secondary | ICD-10-CM | POA: Insufficient documentation

## 2012-07-21 DIAGNOSIS — R42 Dizziness and giddiness: Secondary | ICD-10-CM | POA: Insufficient documentation

## 2012-07-21 DIAGNOSIS — Z79899 Other long term (current) drug therapy: Secondary | ICD-10-CM | POA: Insufficient documentation

## 2012-07-21 DIAGNOSIS — E7800 Pure hypercholesterolemia, unspecified: Secondary | ICD-10-CM | POA: Insufficient documentation

## 2012-07-21 DIAGNOSIS — H9209 Otalgia, unspecified ear: Secondary | ICD-10-CM | POA: Insufficient documentation

## 2012-07-21 DIAGNOSIS — Z7982 Long term (current) use of aspirin: Secondary | ICD-10-CM | POA: Insufficient documentation

## 2012-07-21 DIAGNOSIS — J3489 Other specified disorders of nose and nasal sinuses: Secondary | ICD-10-CM | POA: Diagnosis not present

## 2012-07-21 DIAGNOSIS — J329 Chronic sinusitis, unspecified: Secondary | ICD-10-CM | POA: Diagnosis not present

## 2012-07-21 DIAGNOSIS — Z8542 Personal history of malignant neoplasm of other parts of uterus: Secondary | ICD-10-CM | POA: Diagnosis not present

## 2012-07-21 DIAGNOSIS — Z860100 Personal history of colon polyps, unspecified: Secondary | ICD-10-CM | POA: Insufficient documentation

## 2012-07-21 DIAGNOSIS — Z8739 Personal history of other diseases of the musculoskeletal system and connective tissue: Secondary | ICD-10-CM | POA: Diagnosis not present

## 2012-07-21 MED ORDER — MECLIZINE HCL 25 MG PO TABS: 25.0000 mg | ORAL_TABLET | Freq: Three times a day (TID) | ORAL | Status: DC | PRN

## 2012-07-21 NOTE — ED Notes (Signed)
Pt states that she feels like there is a band around her head and she c/o L earache.  Pt states that she has hx of vertigo.  Pt states that she feels like there is something in her ears that she needs to scratch.  Pt states that she feels like she may have sinus infection.

## 2012-07-21 NOTE — ED Provider Notes (Signed)
History     CSN: [id number]  Arrival date & time 07/21/12  1228   First MD Initiated Contact with Patient 07/21/12 1250      Chief Complaint  Patient presents with  . Dizziness    (Consider location/radiation/quality/duration/timing/severity/associated sxs/prior treatment) The history is provided by the patient.   patient has had vertigo over the last week. She has a previous history of this it has been on Antivert chronically. She states over last week she's had more episodes and states that she feels pressure on her sinuses. No numbness or weakness. No localizing symptoms. No difficulty seen. She states the vertigo is doing better right now. She states she's worried she has a sinus infection. She states she is a fullness in her left ear also. She states she does not have a prescription for Antivert anymore, however she has been buying over-the-counter. Past Medical History  Diagnosis Date  . Status post chemotherapy     carboplatin/paclitaxel x 6 rounds  . Hypercholesterolemia   . History of colonic polyps   . Bursitis of shoulder, left   . Cancer     Endometrial ca/Recurrence  . S/P radiation therapy July 29, 2010-Sep 06, 2010    External beam of pelvis  . S/P radiation therapy 09/15/10, 09/24/10, 09/28/2010    Intracavitary brachytherapy  . Lumbar pain   . Hypercholesteremia   . Allergy   . Hypertension     Past Surgical History  Procedure Laterality Date  . Abdominal hysterectomy    . Robotic assisted laparoscopic vaginal hysterectomy with fibroid removal  July 2010    bilat. salpingo-oophorectomy  . Breast lumpectomy  1993    Left breast, benign    Family History  Problem Relation Age of Onset  . Breast cancer Mother   . Heart disease Father   . Breast cancer Sister   . Diabetes Sister     History  Substance Use Topics  . Smoking status: Former Smoker -- 1.00 packs/day    Types: Cigarettes    Quit date: 05/12/1976  . Smokeless tobacco: Never Used  . Alcohol  Use: No    OB History   Grav Para Term Preterm Abortions TAB SAB Ect Mult Living                  Review of Systems  Constitutional: Negative for activity change and appetite change.  HENT: Positive for ear pain and sinus pressure. Negative for sore throat and neck stiffness.   Eyes: Negative for pain.  Respiratory: Negative for chest tightness and shortness of breath.   Cardiovascular: Negative for chest pain and leg swelling.  Gastrointestinal: Negative for nausea, vomiting, abdominal pain and diarrhea.  Genitourinary: Negative for flank pain.  Musculoskeletal: Negative for back pain.  Skin: Negative for rash.  Neurological: Positive for dizziness. Negative for weakness, numbness and headaches.  Psychiatric/Behavioral: Negative for behavioral problems.    Allergies  Phenobarbital  Home Medications   Current Outpatient Rx  Name  Route  Sig  Dispense  Refill  . aspirin 325 MG tablet   Oral   Take 325 mg by mouth daily.         . benazepril (LOTENSIN) 20 MG tablet   Oral   Take 20 mg by mouth daily.         . calcium carbonate (OS-CAL) 600 MG TABS   Oral   Take 600 mg by mouth 2 (two) times daily with a meal. With vitamin d         .  cholecalciferol (VITAMIN D) 1000 UNITS tablet   Oral   Take 1,000 Units by mouth daily.         . diclofenac sodium (VOLTAREN) 1 % GEL   Topical   Apply topically 2 (two) times daily as needed.         . fexofenadine (ALLEGRA) 60 MG tablet   Oral   Take 60 mg by mouth daily.         . fish oil-omega-3 fatty acids 1000 MG capsule   Oral   Take 1,200 mg by mouth daily.         . Folic Acid-Vit B6-Vit B12 (FOLBEE) 2.5-25-1 MG TABS   Oral   Take 1 tablet by mouth daily.         . meclizine (ANTIVERT) 25 MG tablet   Oral   Take 25 mg by mouth 3 (three) times daily as needed.         . Multiple Vitamin (MULTIVITAMIN) tablet   Oral   Take 1 tablet by mouth daily. Centrum silver         . raloxifene  (EVISTA) 60 MG tablet   Oral   Take 60 mg by mouth daily.         . simvastatin (ZOCOR) 40 MG tablet   Oral   Take 40 mg by mouth every evening.         . docusate sodium (COLACE) 100 MG capsule   Oral   Take 100 mg by mouth 2 (two) times daily as needed.         Marland Kitchen ibuprofen (ADVIL,MOTRIN) 200 MG tablet   Oral   Take 200 mg by mouth every 6 (six) hours as needed.         . meclizine (ANTIVERT) 25 MG tablet   Oral   Take 1 tablet (25 mg total) by mouth 3 (three) times daily as needed.   30 tablet   0     BP 148/64  Pulse 93  Temp(Src) 98.4 F (36.9 C) (Oral)  Resp 18  Ht 5\' 6"  (1.676 m)  Wt 155 lb (70.308 kg)  BMI 25.03 kg/m2  SpO2 99%  Physical Exam  Nursing note and vitals reviewed. Constitutional: She is oriented to person, place, and time. She appears well-developed and well-nourished.  HENT:  Head: Normocephalic and atraumatic.  Left Ear: External ear normal.  Left TM normal. Right TM obscured by cerumen.  Eyes: EOM are normal. Pupils are equal, round, and reactive to light.  Neck: Normal range of motion. Neck supple.  Cardiovascular: Normal rate, regular rhythm and normal heart sounds.   No murmur heard. Pulmonary/Chest: Effort normal and breath sounds normal. No respiratory distress. She has no wheezes. She has no rales.  Abdominal: Soft. Bowel sounds are normal. She exhibits no distension. There is no tenderness. There is no rebound and no guarding.  Musculoskeletal: Normal range of motion.  Neurological: She is alert and oriented to person, place, and time. No cranial nerve deficit.  Finger-nose intact bilaterally. Mild nystagmus at end gaze. Finger-nose intact bilaterally. Face symmetric. Patient's adult and without difficulty. Negative Romberg. Good grip strength bilaterally.  Skin: Skin is warm and dry.  Psychiatric: She has a normal mood and affect. Her speech is normal.    ED Course  Procedures (including critical care time)  Labs Reviewed  - No data to display No results found.   1. Vertigo   2. Sinusitis       MDM  patient with vertigo with history of same. Doubt central vertigo at this time. She has a long history of peripheral vertigo. She has some sinus symptoms but not in overt bacterial sinusitis. She has been taking Tylenol Sinus. She will be given a prescription for Antivert. She does not have vertigo at this time. She will be discharged home        Juliet Rude. Rubin Payor, MD 07/21/12 1328

## 2012-08-01 ENCOUNTER — Telehealth: Payer: Self-pay | Admitting: Gynecologic Oncology

## 2012-08-01 NOTE — Telephone Encounter (Signed)
REASON FOR VISIT: Surveillance for  endometrial cancer.   ASSESSMENT/PLAN This is a 72 year-old with stage IIIA grade 1endometrial cancer treated on GOG protocol 258.  He was randomized to the arm that received 6 cycles of Taxol carboplatin therapy. Recurrent disease was identified at a laparoscopic port site in the vagina in 2011.  He received additional Taxol carboplatin therapy and adjuvant external beam and vaginal brachytherapy. This treatment was completed in June 2012 and she's been without any evidence of disease since.  No evidence of recurrent disease on examination today.  Follow up with Dr. Roselind Messier  In 6 months F/U with Gyn Onc in 12 months  HISTORY OF PRESENT ILLNESS: This is a 72 year old, who underwent a robotic-assisted laparoscopic hysterectomy, bilateral salpingo- oophorectomy, lymph node dissection in July, 2010. Pathology was consistent with a stage III endometrial cancer with microscopic metastases to the adnexa. She was enrolled in GOG protocol 258 and was randomized to receive 6 cycles of Taxol and carboplatin. Posttreatment CT scan in January 2011 was WNl. At the end of 2011, on routine physical examination, she was found to have vaginal recurrence. CT imaging at that time confirmed the presence of an obturator mass at the pelvic port site in the left upper quadrant and enlarged obturator nodes. She  received 6 cycles of carboplatin and Taxol therapy. Imaging demonstrated residual left obturator and inguinal adenopathy and residual soft tissue mass and there was still residual disease in the vagina. She subsequently underwent external beam pelvic radiotherapy along with vaginal brachytherapy and treatment of the port sites. Treatment was completed in June, 2012.   Patient feels 'pretty good'.  Pap 10/2011 wnl  Past Medical History  Diagnosis Date  . Status post chemotherapy     carboplatin/paclitaxel x 6 rounds  . Hypercholesterolemia   . History of colonic polyps   .  Bursitis of shoulder, left   . Cancer     Endometrial ca/Recurrence  . S/P radiation therapy July 29, 2010-Sep 06, 2010    External beam of pelvis  . S/P radiation therapy 09/15/10, 09/24/10, 09/28/2010    Intracavitary brachytherapy  . Lumbar pain   . Hypercholesteremia   . Allergy   . Hypertension    Past Surgical History  Procedure Laterality Date  . Abdominal hysterectomy    . Robotic assisted laparoscopic vaginal hysterectomy with fibroid removal  July 2010    bilat. salpingo-oophorectomy  . Breast lumpectomy  1993    Left breast, benign   SOCIAL HISTORY: The patient's daughter lives in [place]. The patient  has good support in this area. Denies tobacco use.  Mammogram 12/2011  REVIEW OF SYSTEMS Constitutional  Feels well  Skin/Breast  No rash, sores, jaundice, itching, dryness, lumps, swelling, breast pain, or nipple discharge. Age spots  Cardiovascular  No chest pain, shortness of breath, or edema  Pulmonary  No cough or wheeze.  Gastro Intestinal  No nausea, vomitting, or diarrhoea. No bright red blood per rectum, no abdominal pain, change in bowel movement, or constipation.  Genito Urinary  No frequency, urgency, dysuria,  Musculo Skeletal  No myalgia, arthralgia, joint swelling or pain  Neurologic  No weakness, numbness, change in gait,  Psychology  No depression, anxiety, insomnia.     PHYSICAL EXAMINATION:  WD female in NAD  VITAL SIGNS:  CHEST: Clear to auscultation.  HEART: Regular rate and rhythm.  ABDOMEN: Soft, nontender, obese. No palpable lesions at the port sites. No evidence of hernia. No masses. No omental cake. No fluid  wave.  BACK  No CVAT LN.  No axillary cervical or supraclavicular adenopathy PELVIC: Normal external genitalia. Her vagina is atrophic. No lesions visualized or palpated. No cul-de-sac masses or rectovaginal  septum nodularity.  Rectal: Good anal sphincter without any masses. EXT 1-2+ lower extremity edema  bilaterally

## 2012-08-09 ENCOUNTER — Ambulatory Visit: Payer: Medicare Other | Attending: Gynecologic Oncology | Admitting: Gynecologic Oncology

## 2012-08-09 ENCOUNTER — Encounter: Payer: Self-pay | Admitting: Gynecologic Oncology

## 2012-08-09 VITALS — BP 148/80 | HR 82 | Temp 97.9°F | Resp 16 | Ht 65.2 in | Wt 158.0 lb

## 2012-08-09 DIAGNOSIS — C549 Malignant neoplasm of corpus uteri, unspecified: Secondary | ICD-10-CM | POA: Diagnosis not present

## 2012-08-09 DIAGNOSIS — C569 Malignant neoplasm of unspecified ovary: Secondary | ICD-10-CM

## 2012-08-09 NOTE — Patient Instructions (Addendum)
  No evidence of recurrent disease on examination today.  Follow up with Dr. Roselind Messier  In 3 months F/U with Gyn Onc in 6 months    Thank you very much [name] for allowing me to provide care for you today.  I appreciate your confidence in choosing our Gynecologic Oncology team.  If you have any questions about your visit today please call our office and we will get back to you as soon as possible.  Maryclare Labrador. Jessica Larivee MD., PhD Gynecologic Oncology

## 2012-08-09 NOTE — Progress Notes (Signed)
Office Visit:  GYN ONCOLOGY  CHIEF COMPLAINT: Surveillance for  endometrial cancer.   ASSESSMENT/PLAN This is a 72 y.o.with stage IIIA grade 1endometrial cancer treated on GOG protocol 258.  He was randomized to the arm that received 6 cycles of Taxol carboplatin therapy. Recurrent disease was identified at a laparoscopic port site in the vagina in 2011.  He received additional Taxol carboplatin therapy and adjuvant external beam and vaginal brachytherapy. This treatment was completed in June 2012 and she's been without any evidence of disease since.  No evidence of recurrent disease on examination today.  Follow up with Dr. Roselind Messier  In 6 months F/U with Gyn Onc in 12 months  HISTORY OF PRESENT ILLNESS: This is a 72 y.o. , who underwent a robotic-assisted laparoscopic hysterectomy, bilateral salpingo- oophorectomy, lymph node dissection in July, 2010. Pathology was consistent with a stage III endometrial cancer with microscopic metastases to the adnexa. She was enrolled in GOG protocol 258 and was randomized to receive 6 cycles of Taxol and carboplatin. Posttreatment CT scan in January 2011 was WNl. At the end of 2011, on routine physical examination, she was found to have vaginal recurrence. CT imaging at that time confirmed the presence of an obturator mass at the pelvic port site in the left upper quadrant and enlarged obturator nodes. She  received 6 cycles of carboplatin and Taxol therapy. Imaging demonstrated residual left obturator and inguinal adenopathy and residual soft tissue mass and there was still residual disease in the vagina. She subsequently underwent external beam pelvic radiotherapy along with vaginal brachytherapy and treatment of the port sites. Treatment was completed in June, 2012.   Patient feels very well.  Pap January 2014 wnl  Past Medical History  Diagnosis Date  . Status post chemotherapy     carboplatin/paclitaxel x 6 rounds  . Hypercholesterolemia   . History of  colonic polyps   . Bursitis of shoulder, left   . Cancer     Endometrial ca/Recurrence  . S/P radiation therapy July 29, 2010-Sep 06, 2010    External beam of pelvis  . S/P radiation therapy 09/15/10, 09/24/10, 09/28/2010    Intracavitary brachytherapy  . Lumbar pain   . Hypercholesteremia   . Allergy   . Hypertension   Mammogram 12/2011 wnl Colonoscopy scheduled for this year    Past Surgical History  Procedure Laterality Date  . Abdominal hysterectomy    . Robotic assisted laparoscopic vaginal hysterectomy with fibroid removal  July 2010    bilat. salpingo-oophorectomy  . Breast lumpectomy  1993    Left breast, benign   SOCIAL HISTORY: The patient's daughter lives in [place]. The patient has good support in this area. Denies tobacco use.  Is increasing physical activity and decreasing portion size.  REVIEW OF SYSTEMS Constitutional  Feels well  Skin/Breast  No rash, sores, jaundice, itching, dryness, lumps, swelling, breast pain, or nipple discharge. Age spots  Cardiovascular  No chest pain, shortness of breath, or edema  Pulmonary  No cough or wheeze.  Gastro Intestinal  No nausea, vomitting, or diarrhoea. No bright red blood per rectum, no abdominal pain, change in bowel movement, or constipation.  Genito Urinary  No frequency, urgency, dysuria,  Musculo Skeletal  No myalgia, arthralgia, joint swelling or pain  Neurologic  No weakness, numbness, change in gait,  Psychology  No depression, anxiety, insomnia.     PHYSICAL EXAMINATION:  WD female in NAD  VITAL SIGNS: BP 148/80  Pulse 82  Temp(Src) 97.9 F (36.6 C) (Oral)  Resp 16  Ht 5' 5.2" (1.656 m)  Wt 158 lb (71.668 kg)  BMI 26.13 kg/m2  CHEST: Clear to auscultation.  HEART: Regular rate and rhythm.  ABDOMEN: Soft, nontender, obese. No palpable lesions at the port sites. No evidence of hernia. No masses. No omental cake. No fluid  wave.  BACK  No CVAT LN.  No axillary, cervical or supraclavicular  adenopathy PELVIC: Normal external genitalia. Her vagina is atrophic. No lesions visualized or palpated. No cul-de-sac masses or rectovaginal  septum nodularity.  Rectal: Good anal sphincter without any masses. EXT 1-2+ lower extremity edema bilaterally

## 2012-08-20 DIAGNOSIS — R7301 Impaired fasting glucose: Secondary | ICD-10-CM | POA: Diagnosis not present

## 2012-08-20 DIAGNOSIS — J301 Allergic rhinitis due to pollen: Secondary | ICD-10-CM | POA: Diagnosis not present

## 2012-08-20 DIAGNOSIS — I10 Essential (primary) hypertension: Secondary | ICD-10-CM | POA: Diagnosis not present

## 2012-08-20 DIAGNOSIS — C549 Malignant neoplasm of corpus uteri, unspecified: Secondary | ICD-10-CM | POA: Diagnosis not present

## 2012-08-20 DIAGNOSIS — M1990 Unspecified osteoarthritis, unspecified site: Secondary | ICD-10-CM | POA: Diagnosis not present

## 2012-08-20 DIAGNOSIS — E785 Hyperlipidemia, unspecified: Secondary | ICD-10-CM | POA: Diagnosis not present

## 2012-10-01 DIAGNOSIS — M533 Sacrococcygeal disorders, not elsewhere classified: Secondary | ICD-10-CM | POA: Diagnosis not present

## 2012-10-01 DIAGNOSIS — M5137 Other intervertebral disc degeneration, lumbosacral region: Secondary | ICD-10-CM | POA: Diagnosis not present

## 2012-10-30 ENCOUNTER — Encounter: Payer: Self-pay | Admitting: Radiation Oncology

## 2012-10-30 ENCOUNTER — Ambulatory Visit
Admission: RE | Admit: 2012-10-30 | Discharge: 2012-10-30 | Disposition: A | Discharge status: Home / Self Care | Payer: Medicare Other | Source: Ambulatory Visit | Attending: Radiation Oncology | Admitting: Radiation Oncology

## 2012-10-30 ENCOUNTER — Other Ambulatory Visit (HOSPITAL_COMMUNITY)
Admission: RE | Admit: 2012-10-30 | Discharge: 2012-10-30 | Disposition: A | Discharge status: Home / Self Care | Payer: Medicare Other | Source: Ambulatory Visit | Attending: Radiation Oncology | Admitting: Radiation Oncology

## 2012-10-30 VITALS — BP 139/72 | HR 100 | Temp 99.0°F | Resp 20 | Wt 159.4 lb

## 2012-10-30 DIAGNOSIS — C549 Malignant neoplasm of corpus uteri, unspecified: Secondary | ICD-10-CM | POA: Diagnosis not present

## 2012-10-30 DIAGNOSIS — Z124 Encounter for screening for malignant neoplasm of cervix: Secondary | ICD-10-CM | POA: Diagnosis not present

## 2012-10-30 NOTE — Addendum Note (Signed)
Encounter addended by: Billie Lade, MD on: 10/30/2012  3:20 PM<BR>     Documentation filed: Orders

## 2012-10-30 NOTE — Progress Notes (Signed)
Pt denies pain, urinary problems, bowel issues, vaginal discharge, fatigue, loss of appetite. Pt using vaginal dilator every other day. She states afterwards she occasionally has pinkish discharge.

## 2012-10-30 NOTE — Progress Notes (Signed)
Radiation Oncology         [phone]) [phone] ________________________________  Name: [name] MRN: [id number]  Date: 10/30/2012  DOB: 03/22/1941  Follow-Up Visit Note  CC: Gaspar Garbe, MD  Thompson Caul., MD  Diagnosis:   Recurrent endometrial cancer  Interval Since Last Radiation:  2 years and one month   Narrative:  The patient returns today for routine follow-up.  She is doing well and without complaints. She continues to use her vaginal dilator on a regular basis. She denies any vaginal bleeding urination difficulties or bowel complaints. She has had some low back pain and did see orthopedic surgery. She did receive an injection which ihas helped this issue.                              ALLERGIES:  is allergic to phenobarbital.  Meds: Current Outpatient Prescriptions  Medication Sig Dispense Refill  . aspirin 325 MG tablet Take 325 mg by mouth daily.      . benazepril (LOTENSIN) 20 MG tablet Take 20 mg by mouth daily.      . calcium carbonate (OS-CAL) 600 MG TABS Take 600 mg by mouth 2 (two) times daily with a meal. With vitamin d      . cholecalciferol (VITAMIN D) 1000 UNITS tablet Take 1,000 Units by mouth daily.      . diclofenac sodium (VOLTAREN) 1 % GEL Apply topically 2 (two) times daily as needed.      . docusate sodium (COLACE) 100 MG capsule Take 100 mg by mouth 2 (two) times daily as needed.      . fexofenadine (ALLEGRA) 60 MG tablet Take 60 mg by mouth daily.      . fish oil-omega-3 fatty acids 1000 MG capsule Take 1,200 mg by mouth daily.      . Folic Acid-Vit B6-Vit B12 (FOLBEE) 2.5-25-1 MG TABS Take 1 tablet by mouth daily.      Marland Kitchen ibuprofen (ADVIL,MOTRIN) 200 MG tablet Take 200 mg by mouth every 6 (six) hours as needed.      . meclizine (ANTIVERT) 25 MG tablet Take 25 mg by mouth 3 (three) times daily as needed.      . meclizine (ANTIVERT) 25 MG tablet Take 1 tablet (25 mg total) by mouth 3 (three) times daily as needed.  30 tablet  0  . Multiple Vitamin  (MULTIVITAMIN) tablet Take 1 tablet by mouth daily. Centrum silver      . raloxifene (EVISTA) 60 MG tablet Take 60 mg by mouth daily.      . simvastatin (ZOCOR) 40 MG tablet Take 40 mg by mouth every evening.       No current facility-administered medications for this encounter.    Physical Findings: The patient is in no acute distress. Patient is alert and oriented.  weight is 159 lb 6.4 oz (72.303 kg). Her oral temperature is 99 F (37.2 C). Her blood pressure is 139/72 and her pulse is 100. Her respiration is 20. Marland Kitchen No palpable supraclavicular or axillary adenopathy. The lungs are clear to auscultation. The heart has regular rhythm and rate. The abdomen is soft and nontender with normal bowel sounds. Patient has some hyperpigmentation changes in the area of previous treatment for subcutaneous involvement. There is no palpable mass noted in this area. The inguinal areas are free of adenopathy. On pelvic examination the external genitalia are unremarkable. Speculum exam is performed. There some radiation changes noted in  the proximal vagina. A Pap smear was obtained of the proximal vagina. On bimanual and rectovaginal examination there no pelvic masses appreciated.   Radiographic Findings: No results found.  Impression: No evidence of recurrence on clinical exam today, Pap smear pending  Plan:  Routine followup in 6 months.  In the interim the patient will be seen by gynecologic oncology.  _____________________________________  -----------------------------------  Billie Lade, PhD, MD

## 2012-11-01 ENCOUNTER — Telehealth: Payer: Self-pay | Admitting: *Deleted

## 2012-11-01 NOTE — Telephone Encounter (Signed)
Left vm on pt's mobile phone informing her per Dr Roselind Messier, that her 10/30/12 pap smear results are normal, "good". Left call back name and number for any questions.

## 2013-01-09 DIAGNOSIS — Z1231 Encounter for screening mammogram for malignant neoplasm of breast: Secondary | ICD-10-CM | POA: Diagnosis not present

## 2013-01-17 DIAGNOSIS — Z23 Encounter for immunization: Secondary | ICD-10-CM | POA: Diagnosis not present

## 2013-01-24 ENCOUNTER — Encounter: Payer: Self-pay | Admitting: Gynecologic Oncology

## 2013-01-24 ENCOUNTER — Ambulatory Visit: Payer: Medicare Other | Attending: Gynecologic Oncology | Admitting: Gynecologic Oncology

## 2013-01-24 VITALS — BP 147/65 | HR 106 | Temp 97.9°F | Resp 16 | Ht 65.0 in | Wt 161.7 lb

## 2013-01-24 DIAGNOSIS — Z90710 Acquired absence of both cervix and uterus: Secondary | ICD-10-CM | POA: Diagnosis not present

## 2013-01-24 DIAGNOSIS — C549 Malignant neoplasm of corpus uteri, unspecified: Secondary | ICD-10-CM | POA: Diagnosis not present

## 2013-01-24 DIAGNOSIS — C541 Malignant neoplasm of endometrium: Secondary | ICD-10-CM

## 2013-01-24 NOTE — Progress Notes (Signed)
Office Visit:  GYN ONCOLOGY  CHIEF COMPLAINT: Surveillance for  endometrial cancer.   ASSESSMENT/PLAN This is a 72 y.o.with stage IIIA grade 1endometrial cancer treated on GOG protocol 258.  He was randomized to the arm that received 6 cycles of Taxol carboplatin therapy. Recurrent disease was identified at a laparoscopic port site in the vagina in 2011.  She received additional Taxol carboplatin therapy and adjuvant external beam and vaginal brachytherapy. This treatment was completed in June 2012 and she's been without any evidence of disease since.  No evidence of recurrent disease on examination today.  Follow up with Dr. Roselind Messier in 3 months F/U with Gyn Onc in 6 months  HISTORY OF PRESENT ILLNESS: This is a 72 y.o. , who underwent a robotic-assisted laparoscopic hysterectomy, bilateral salpingo- oophorectomy, lymph node dissection in July, 2010. Pathology was consistent with a stage III endometrial cancer with microscopic metastases to the adnexa. She was enrolled in GOG protocol 258 and was randomized to receive 6 cycles of Taxol and carboplatin. Posttreatment CT scan in January 2011 was WNl. At the end of 2011, on routine physical examination, she was found to have vaginal recurrence. CT imaging at that time confirmed the presence of an obturator mass at the pelvic port site in the left upper quadrant and enlarged obturator nodes. She  received 6 cycles of carboplatin and Taxol therapy. Imaging demonstrated residual left obturator and inguinal adenopathy and residual soft tissue mass and there was still residual disease in the vagina. She subsequently underwent external beam pelvic radiotherapy along with vaginal brachytherapy and treatment of the port sites. Treatment was completed in June, 2012.   Patient feels very well.  Pap January and July  2014 wnl  Past Medical History  Diagnosis Date  . Status post chemotherapy     carboplatin/paclitaxel x 6 rounds  . Hypercholesterolemia   .  History of colonic polyps   . Bursitis of shoulder, left   . Cancer     Endometrial ca/Recurrence  . S/P radiation therapy July 29, 2010-Sep 06, 2010    External beam of pelvis  . S/P radiation therapy 09/15/10, 09/24/10, 09/28/2010    Intracavitary brachytherapy  . Lumbar pain   . Hypercholesteremia   . Allergy   . Hypertension   Mammogram 12/2012 wnl Colonoscopy scheduled for this year   Past Surgical History  Procedure Laterality Date  . Abdominal hysterectomy    . Robotic assisted laparoscopic vaginal hysterectomy with fibroid removal  July 2010    bilat. salpingo-oophorectomy  . Breast lumpectomy  1993    Left breast, benign   SOCIAL HISTORY: The patient's daughter lives in [place]. The patient has good support in this area. Denies tobacco use. Daughter will visit at Christmas.    REVIEW OF SYSTEMS Constitutional  Feels well  Cardiovascular  No chest pain, shortness of breath, or edema  Pulmonary  No cough or wheeze.  Gastro Intestinal  No nausea, vomitting, or diarrhoea. No bright red blood per rectum, no abdominal pain, change in bowel movement, or constipation.  Genito Urinary  No frequency, urgency, dysuria, no vaginal bleeding or discharge Musculo Skeletal  No myalgia, arthralgia, joint swelling or pain sciatica better since spinal injection Neurologic  No weakness, numbness, change in gait,  Psychology  No depression, anxiety, insomnia.     PHYSICAL EXAMINATION:  WD female in NAD VITAL SIGNS: BP 147/65  Pulse 106  Temp(Src) 97.9 F (36.6 C) (Oral)  Resp 16  Ht 5\' 5"  (1.651 m)  Wt 161  lb 11.2 oz (73.347 kg)  BMI 26.91 kg/m2 CHEST: Clear to auscultation.  HEART: Regular rate and rhythm.  ABDOMEN: Soft, nontender, obese. No palpable lesions at the port sites. No evidence of hernia. No masses. No omental cake. No fluid wave.  BACK  No CVAT LN.  No axillary, cervical or supraclavicular adenopathy PELVIC: Normal external genitalia. Her vagina is atrophic.  No lesions visualized or palpated. No cul-de-sac masses or rectovaginal septum nodularity.  Rectal: Good anal sphincter without any masses. EXT 1-2+ lower extremity edema bilaterally

## 2013-01-24 NOTE — Patient Instructions (Signed)
F/U  With Dr. Roselind Messier 04/2103 F/U with Gyn Onc 07/2013    UTERINE CANCER SURVEILLANCE  The uterus is part of a woman's reproductive system. It is the hollow, pear-shaped organ where a baby grows. The uterus is in the pelvis between the bladder and the rectum. The narrow, lower portion of the uterus is the cervix. The fallopian tubes extend from either side of the top of the uterus to the ovaries. The wall of the uterus has two layers of tissue. The inner layer, or lining, is the endometrium. The outer layer is muscle tissue called the myometrium. In women of childbearing age, the lining of the uterus grows and thickens each month to prepare for pregnancy. If a woman does not become pregnant, the thick, bloody lining flows out of the body through the vagina. This flow is called menstruation. TYPES OF UTERINE CANCER  The most common type of cancer of the uterus begins in the lining (endometrium). It is called endometrial cancer, uterine cancer, or cancer of the uterus. It is seen in 2% to 3% of women.  A different type of cancer, uterine sarcoma, develops in the muscle (myometrium). Cancer that begins in the cervix is also a different type of cancer.  Rarely, a noncancerous fibroid tumor of the uterus develops into a sarcoma. CAUSES  No one knows the exact causes of uterine cancer. But it is clear that this disease is not contagious. No one can "catch" cancer from another person. Women who get this disease are more likely than other women to have certain risk factors. A risk factor is something that increases a person's chance of developing the disease.  Most women who have known risk factors do not get uterine cancer. On the other hand, many who do get this disease have none of these factors. Doctors can seldom explain why one woman gets uterine cancer and another does not.  Studies have found the following risk factors:  Age. Cancer of the uterus occurs mostly in women over age 106.  Endometrial  hyperplasia (enlarged endometrium). The risk of uterine cancer is higher if a woman has endometrial hyperplasia.  Hormone replacement therapy (HRT). HRT is used to control the symptoms of menopause, to prevent osteoporosis (thinning of the bones), and to reduce the risk of heart disease or stroke. Women who still have their uterus, and use estrogen without progesterone, have an increased risk of uterine cancer. Long-term use and large doses of estrogen seem to increase this risk. Women who use a combination of estrogen and progesterone have a lower risk of uterine cancer than women who use estrogen alone. The progesterone protects the uterus from developing cancer.  Obesity and related conditions. The body stores and releases some of its estrogen in fatty tissue. That is why obese women are more likely than thin women to have higher levels of estrogen in their bodies. High levels of estrogen may be the reason that obese women have an increased risk of developing uterine cancer. The risk of this disease is also higher in women with diabetes or high blood pressure. These conditions occur in many obese women.  Tamoxifen. Women taking the drug tamoxifen to prevent or treat breast cancer have an increased risk of uterine cancer. This risk appears to be related to the estrogen-like effect of this drug on the uterus.  Race. White women are more likely than African-American women to get uterine cancer.  Colorectal cancer. Women who have had an inherited form of colorectal cancer have a  higher risk of developing uterine cancer than other women.  Infertility.  Beginning menstrual periods before age 35.  Having menstrual periods after age 69.  History of cancer of the ovary or intestine.  Family history of uterine cancer.  Having diabetes, high blood pressure, thyroid or gallbladder disease.  Long-term use of high does of birth control pills. Birth control pills today are low in hormone  doses.  Radiation to the abdomen or pelvis.  Smoking.  SEEK MEDICAL CARE IF:   You have any abnormal vaginal bleeding.  You have bleeding after sexual intercourse.  Your stomach is growing,  You have pain with sexual intercourse.  You have stomach or pelvis pain.  You have weight loss for no known reason.  You have pain or difficulty with urination. [facility]  Cancer Information Service (CIS) provides accurate, up-to-date information on cancer to patients and their families, health professionals, and the general public:  Phone: 1-800-4-CANCER ((380) 652-7136).  Internet: http://www.cancer.gov NCI's website contains complete information about cancer causes and prevention, screening and diagnosis, treatment and survivorship, clinical trials, statistics, funding, training, and employment opportunities, and [employer] and its programs. CLINICAL TRIALS A woman who is interested in being part of a clinical trial should talk with her caregiver. NCI's website (http://www.johnson-fowler.biz/) provides general information about clinical trials. It also offers detailed information about specific ongoing studies of uterine cancer by linking to PDQ, a cancer information database developed by the NCI. The Cancer Information Service at 1-800-4-CANCER can answer questions about cancer and provide information from the PDQ database. Document Released: 04/11/2005 Document Revised: 07/04/2011 Document Reviewed: 02/12/2009 [facility] Patient Information 2014 [place], [place].

## 2013-01-31 ENCOUNTER — Encounter (HOSPITAL_BASED_OUTPATIENT_CLINIC_OR_DEPARTMENT_OTHER): Payer: Self-pay | Admitting: Emergency Medicine

## 2013-01-31 ENCOUNTER — Inpatient Hospital Stay (HOSPITAL_BASED_OUTPATIENT_CLINIC_OR_DEPARTMENT_OTHER)
Admission: EM | Admit: 2013-01-31 | Discharge: 2013-02-03 | DRG: 390 | Disposition: A | Discharge status: Home / Self Care | Payer: Medicare Other | Attending: Internal Medicine | Admitting: Internal Medicine

## 2013-01-31 ENCOUNTER — Emergency Department (HOSPITAL_BASED_OUTPATIENT_CLINIC_OR_DEPARTMENT_OTHER): Payer: Medicare Other

## 2013-01-31 DIAGNOSIS — Z803 Family history of malignant neoplasm of breast: Secondary | ICD-10-CM

## 2013-01-31 DIAGNOSIS — I10 Essential (primary) hypertension: Secondary | ICD-10-CM | POA: Diagnosis present

## 2013-01-31 DIAGNOSIS — R188 Other ascites: Secondary | ICD-10-CM

## 2013-01-31 DIAGNOSIS — Z923 Personal history of irradiation: Secondary | ICD-10-CM | POA: Diagnosis not present

## 2013-01-31 DIAGNOSIS — R109 Unspecified abdominal pain: Secondary | ICD-10-CM | POA: Diagnosis present

## 2013-01-31 DIAGNOSIS — Z8541 Personal history of malignant neoplasm of cervix uteri: Secondary | ICD-10-CM

## 2013-01-31 DIAGNOSIS — R51 Headache: Secondary | ICD-10-CM | POA: Diagnosis present

## 2013-01-31 DIAGNOSIS — S32810A Multiple fractures of pelvis with stable disruption of pelvic ring, initial encounter for closed fracture: Secondary | ICD-10-CM

## 2013-01-31 DIAGNOSIS — Z87891 Personal history of nicotine dependence: Secondary | ICD-10-CM

## 2013-01-31 DIAGNOSIS — N390 Urinary tract infection, site not specified: Secondary | ICD-10-CM | POA: Diagnosis not present

## 2013-01-31 DIAGNOSIS — E7800 Pure hypercholesterolemia, unspecified: Secondary | ICD-10-CM | POA: Diagnosis present

## 2013-01-31 DIAGNOSIS — R112 Nausea with vomiting, unspecified: Secondary | ICD-10-CM | POA: Diagnosis present

## 2013-01-31 DIAGNOSIS — C549 Malignant neoplasm of corpus uteri, unspecified: Secondary | ICD-10-CM | POA: Diagnosis present

## 2013-01-31 DIAGNOSIS — R1110 Vomiting, unspecified: Secondary | ICD-10-CM

## 2013-01-31 DIAGNOSIS — Z860100 Personal history of colon polyps, unspecified: Secondary | ICD-10-CM

## 2013-01-31 DIAGNOSIS — K56609 Unspecified intestinal obstruction, unspecified as to partial versus complete obstruction: Principal | ICD-10-CM | POA: Diagnosis present

## 2013-01-31 DIAGNOSIS — Z9221 Personal history of antineoplastic chemotherapy: Secondary | ICD-10-CM | POA: Diagnosis not present

## 2013-01-31 DIAGNOSIS — Z87311 Personal history of (healed) other pathological fracture: Secondary | ICD-10-CM

## 2013-01-31 LAB — CBC WITH DIFFERENTIAL/PLATELET
Basophils Relative: 0 % (ref 0–1)
Eosinophils Absolute: 0.1 10*3/uL (ref 0.0–0.7)
Eosinophils Relative: 1 % (ref 0–5)
HCT: 41.3 % (ref 36.0–46.0)
Hemoglobin: 13.7 g/dL (ref 12.0–15.0)
MCH: 31.3 pg (ref 26.0–34.0)
MCHC: 33.2 g/dL (ref 30.0–36.0)
MCV: 94.3 fL (ref 78.0–100.0)
Monocytes Absolute: 0.3 10*3/uL (ref 0.1–1.0)
Monocytes Relative: 4 % (ref 3–12)
Neutro Abs: 6.6 10*3/uL (ref 1.7–7.7)

## 2013-01-31 LAB — URINALYSIS, ROUTINE W REFLEX MICROSCOPIC
Bilirubin Urine: NEGATIVE
Glucose, UA: NEGATIVE mg/dL
Ketones, ur: 15 mg/dL — AB
Protein, ur: NEGATIVE mg/dL
pH: 6.5 (ref 5.0–8.0)

## 2013-01-31 LAB — CBC
HCT: 36.5 % (ref 36.0–46.0)
Hemoglobin: 12.2 g/dL (ref 12.0–15.0)
MCH: 31 pg (ref 26.0–34.0)
MCV: 92.6 fL (ref 78.0–100.0)
Platelets: 247 10*3/uL (ref 150–400)
RBC: 3.94 MIL/uL (ref 3.87–5.11)
WBC: 7.6 10*3/uL (ref 4.0–10.5)

## 2013-01-31 LAB — HEPATIC FUNCTION PANEL
ALT: 24 U/L (ref 0–35)
Alkaline Phosphatase: 75 U/L (ref 39–117)
Bilirubin, Direct: <0.1 mg/dL (ref 0.0–0.3)

## 2013-01-31 LAB — BASIC METABOLIC PANEL
BUN: 18 mg/dL (ref 6–23)
Calcium: 10 mg/dL (ref 8.4–10.5)
Chloride: 102 mEq/L (ref 96–112)
Creatinine, Ser: 1 mg/dL (ref 0.50–1.10)
GFR calc Af Amer: 63 mL/min — ABNORMAL LOW (ref >90)

## 2013-01-31 LAB — CREATININE, SERUM: GFR calc non Af Amer: 63 mL/min — ABNORMAL LOW (ref >90)

## 2013-01-31 LAB — URINE MICROSCOPIC-ADD ON

## 2013-01-31 MED ORDER — ONDANSETRON HCL 4 MG/2ML IJ SOLN
4.0000 mg | Freq: Three times a day (TID) | INTRAMUSCULAR | Status: AC | PRN
  Administered 2013-01-31: 4 mg via INTRAVENOUS
  Filled 2013-01-31: qty 2

## 2013-01-31 MED ORDER — SODIUM CHLORIDE 0.9 % IV BOLUS (SEPSIS)
1000.0000 mL | Freq: Once | INTRAVENOUS | Status: AC
  Administered 2013-01-31: 1000 mL via INTRAVENOUS

## 2013-01-31 MED ORDER — CEFTRIAXONE SODIUM 1 G IJ SOLR
INTRAMUSCULAR | Status: AC
  Filled 2013-01-31: qty 10

## 2013-01-31 MED ORDER — MORPHINE SULFATE 2 MG/ML IJ SOLN
2.0000 mg | INTRAMUSCULAR | Status: DC | PRN
  Administered 2013-02-01: 2 mg via INTRAVENOUS
  Filled 2013-01-31: qty 1

## 2013-01-31 MED ORDER — ACETAMINOPHEN 650 MG RE SUPP: 650.0000 mg | Freq: Four times a day (QID) | RECTAL | Status: DC | PRN

## 2013-01-31 MED ORDER — FENTANYL CITRATE 0.05 MG/ML IJ SOLN
50.0000 ug | Freq: Once | INTRAMUSCULAR | Status: AC
  Administered 2013-01-31: 50 ug via INTRAVENOUS
  Filled 2013-01-31: qty 2

## 2013-01-31 MED ORDER — ONDANSETRON HCL 4 MG/2ML IJ SOLN
4.0000 mg | Freq: Four times a day (QID) | INTRAMUSCULAR | Status: DC | PRN
  Administered 2013-02-01: 4 mg via INTRAVENOUS
  Filled 2013-01-31: qty 2

## 2013-01-31 MED ORDER — FENTANYL CITRATE 0.05 MG/ML IJ SOLN: 100.0000 ug | Freq: Once | INTRAMUSCULAR | Status: AC | PRN

## 2013-01-31 MED ORDER — DEXTROSE 5 % IV SOLN
1.0000 g | INTRAVENOUS | Status: DC
  Administered 2013-01-31 – 2013-02-01 (×2): 1 g via INTRAVENOUS
  Filled 2013-01-31 (×3): qty 10

## 2013-01-31 MED ORDER — METOPROLOL TARTRATE 1 MG/ML IV SOLN
5.0000 mg | Freq: Four times a day (QID) | INTRAVENOUS | Status: DC | PRN
  Filled 2013-01-31: qty 5

## 2013-01-31 MED ORDER — ONDANSETRON HCL 4 MG/2ML IJ SOLN
4.0000 mg | Freq: Once | INTRAMUSCULAR | Status: AC
  Administered 2013-01-31: 4 mg via INTRAVENOUS
  Filled 2013-01-31: qty 2

## 2013-01-31 MED ORDER — DEXTROSE 5 % IV SOLN
1.0000 g | Freq: Once | INTRAVENOUS | Status: AC
  Administered 2013-01-31: 1 g via INTRAVENOUS

## 2013-01-31 MED ORDER — ACETAMINOPHEN 325 MG PO TABS: 650.0000 mg | ORAL_TABLET | Freq: Four times a day (QID) | ORAL | Status: DC | PRN

## 2013-01-31 MED ORDER — ENOXAPARIN SODIUM 40 MG/0.4ML ~~LOC~~ SOLN
40.0000 mg | SUBCUTANEOUS | Status: DC
  Administered 2013-01-31 – 2013-02-02 (×3): 40 mg via SUBCUTANEOUS
  Filled 2013-01-31 (×4): qty 0.4

## 2013-01-31 MED ORDER — SODIUM CHLORIDE 0.9 % IV SOLN
INTRAVENOUS | Status: AC
  Administered 2013-01-31: 06:00:00 via INTRAVENOUS

## 2013-01-31 MED ORDER — SODIUM CHLORIDE 0.9 % IV SOLN
INTRAVENOUS | Status: DC
  Administered 2013-01-31 – 2013-02-02 (×5): via INTRAVENOUS

## 2013-01-31 MED ORDER — FENTANYL CITRATE 0.05 MG/ML IJ SOLN
100.0000 ug | Freq: Once | INTRAMUSCULAR | Status: AC
  Administered 2013-01-31: 100 ug via INTRAVENOUS
  Filled 2013-01-31: qty 2

## 2013-01-31 MED ORDER — ONDANSETRON HCL 4 MG PO TABS: 4.0000 mg | ORAL_TABLET | Freq: Four times a day (QID) | ORAL | Status: DC | PRN

## 2013-01-31 NOTE — ED Notes (Signed)
C/o abd pain with vomiting and diarrhea since 5pm Wednesday

## 2013-01-31 NOTE — ED Provider Notes (Signed)
CSN: [id number]     Arrival date & time 01/31/13  0143 History   First MD Initiated Contact with Patient 01/31/13 0156     Chief Complaint  Patient presents with  . Abdominal Pain   (Consider location/radiation/quality/duration/timing/severity/associated sxs/prior Treatment) Patient is a 72 y.o. female presenting with abdominal pain. The history is provided by the patient.  Abdominal Pain Pain quality: aching   Pain radiates to:  Does not radiate Pain severity:  Moderate Onset quality:  Sudden Timing:  Constant Progression:  Unchanged Chronicity:  New Context: retching   Relieved by:  Nothing Worsened by:  Nothing tried Ineffective treatments:  None tried Associated symptoms: diarrhea and vomiting   Diarrhea:    Quality:  Watery   Severity:  Moderate   Timing:  Constant   Progression:  Unchanged Vomiting:    Quality:  Stomach contents   Number of occurrences:  6   Severity:  Moderate   Timing:  Constant   Progression:  Unchanged Risk factors: no recent hospitalization     Past Medical History  Diagnosis Date  . Status post chemotherapy     carboplatin/paclitaxel x 6 rounds  . Hypercholesterolemia   . History of colonic polyps   . Bursitis of shoulder, left   . Cancer     Endometrial ca/Recurrence  . S/P radiation therapy Angelito Hopping 5, 2012-Sep 06, 2010    External beam of pelvis  . S/P radiation therapy 09/15/10, 09/24/10, 09/28/2010    Intracavitary brachytherapy  . Lumbar pain   . Hypercholesteremia   . Allergy   . Hypertension    Past Surgical History  Procedure Laterality Date  . Abdominal hysterectomy    . Robotic assisted laparoscopic vaginal hysterectomy with fibroid removal  July 2010    bilat. salpingo-oophorectomy  . Breast lumpectomy  1993    Left breast, benign   Family History  Problem Relation Age of Onset  . Breast cancer Mother   . Heart disease Father   . Breast cancer Sister   . Diabetes Sister    History  Substance Use Topics  . Smoking  status: Former Smoker -- 1.00 packs/day    Types: Cigarettes    Quit date: 05/12/1976  . Smokeless tobacco: Never Used  . Alcohol Use: No   OB History   Grav Para Term Preterm Abortions TAB SAB Ect Mult Living                 Review of Systems  Gastrointestinal: Positive for vomiting, abdominal pain and diarrhea.  Genitourinary: Positive for frequency.  All other systems reviewed and are negative.    Allergies  Phenobarbital  Home Medications   Current Outpatient Rx  Name  Route  Sig  Dispense  Refill  . aspirin 325 MG tablet   Oral   Take 325 mg by mouth daily.         . benazepril (LOTENSIN) 20 MG tablet   Oral   Take 20 mg by mouth daily.         . calcium carbonate (OS-CAL) 600 MG TABS   Oral   Take 600 mg by mouth 2 (two) times daily with a meal. With vitamin d         . cholecalciferol (VITAMIN D) 1000 UNITS tablet   Oral   Take 1,000 Units by mouth daily.         . diclofenac sodium (VOLTAREN) 1 % GEL   Topical   Apply topically 2 (two) times daily as  needed.         . docusate sodium (COLACE) 100 MG capsule   Oral   Take 100 mg by mouth 2 (two) times daily as needed.         . fexofenadine (ALLEGRA) 60 MG tablet   Oral   Take 60 mg by mouth daily.         . fish oil-omega-3 fatty acids 1000 MG capsule   Oral   Take 1,200 mg by mouth daily.         . Folic Acid-Vit B6-Vit B12 (FOLBEE) 2.5-25-1 MG TABS   Oral   Take 1 tablet by mouth daily.         Marland Kitchen ibuprofen (ADVIL,MOTRIN) 200 MG tablet   Oral   Take 200 mg by mouth every 6 (six) hours as needed.         . meclizine (ANTIVERT) 25 MG tablet   Oral   Take 25 mg by mouth 3 (three) times daily as needed.         . meclizine (ANTIVERT) 25 MG tablet   Oral   Take 1 tablet (25 mg total) by mouth 3 (three) times daily as needed.   30 tablet   0   . Multiple Vitamin (MULTIVITAMIN) tablet   Oral   Take 1 tablet by mouth daily. Centrum silver         . raloxifene  (EVISTA) 60 MG tablet   Oral   Take 60 mg by mouth daily.         . simvastatin (ZOCOR) 40 MG tablet   Oral   Take 40 mg by mouth every evening.          BP 165/80  Pulse 110  Temp(Src) 97.5 F (36.4 C) (Oral)  Resp 18  Ht 5\' 6"  (1.676 m)  Wt 159 lb (72.122 kg)  BMI 25.68 kg/m2  SpO2 100% Physical Exam  Constitutional: She is oriented to person, place, and time. She appears well-developed and well-nourished. No distress.  HENT:  Head: Normocephalic and atraumatic.  Mouth/Throat: No oropharyngeal exudate.  Tacky mucus membranes  Eyes: Conjunctivae are normal. Pupils are equal, round, and reactive to light.  Neck: Normal range of motion. Neck supple.  Cardiovascular: Regular rhythm and intact distal pulses.  Tachycardia present.   Pulmonary/Chest: Effort normal and breath sounds normal. She has no wheezes. She has no rales.  Abdominal: Soft. Bowel sounds are normal. She exhibits no mass. There is tenderness. There is no rebound and no guarding.  Diffuse and worse in left flank  Musculoskeletal: Normal range of motion.  Neurological: She is alert and oriented to person, place, and time.  Skin: Skin is warm and dry.  Psychiatric: She has a normal mood and affect.    ED Course  Procedures (including critical care time) Labs Review Labs Reviewed  URINALYSIS, ROUTINE W REFLEX MICROSCOPIC - Abnormal; Notable for the following:    APPearance TURBID (*)    Ketones, ur 15 (*)    Leukocytes, UA SMALL (*)    All other components within normal limits  CBC WITH DIFFERENTIAL - Abnormal; Notable for the following:    Neutrophils Relative % 81 (*)    All other components within normal limits  BASIC METABOLIC PANEL - Abnormal; Notable for the following:    Glucose, Bld 185 (*)    GFR calc non Af Amer 55 (*)    GFR calc Af Amer 63 (*)    All other components within normal  limits  URINE MICROSCOPIC-ADD ON - Abnormal; Notable for the following:    Bacteria, UA FEW (*)    All  other components within normal limits  URINE CULTURE   Imaging Review No results found.  MDM   Medications  cefTRIAXone (ROCEPHIN) 1 g in dextrose 5 % 50 mL IVPB (1 g Intravenous New Bag/Given 01/31/13 0455)  0.9 %  sodium chloride infusion (not administered)  ondansetron (ZOFRAN) injection 4 mg (not administered)  ondansetron (ZOFRAN) injection 4 mg (4 mg Intravenous Given 01/31/13 0230)  sodium chloride 0.9 % bolus 1,000 mL (0 mLs Intravenous Stopped 01/31/13 0330)  fentaNYL (SUBLIMAZE) injection 50 mcg (50 mcg Intravenous Given 01/31/13 0230)  fentaNYL (SUBLIMAZE) injection 100 mcg (100 mcg Intravenous Given 01/31/13 0441)  sodium chloride 0.9 % bolus 1,000 mL (1,000 mLs Intravenous New Bag/Given 01/31/13 0442)  cefTRIAXone (ROCEPHIN) 1 G injection (  Duplicate 01/31/13 0456)    Results for orders placed during the hospital encounter of 01/31/13  URINALYSIS, ROUTINE W REFLEX MICROSCOPIC      Result Value Range   Color, Urine YELLOW  YELLOW   APPearance TURBID (*) CLEAR   Specific Gravity, Urine 1.021  1.005 - 1.030   pH 6.5  5.0 - 8.0   Glucose, UA NEGATIVE  NEGATIVE mg/dL   Hgb urine dipstick NEGATIVE  NEGATIVE   Bilirubin Urine NEGATIVE  NEGATIVE   Ketones, ur 15 (*) NEGATIVE mg/dL   Protein, ur NEGATIVE  NEGATIVE mg/dL   Urobilinogen, UA 0.2  0.0 - 1.0 mg/dL   Nitrite NEGATIVE  NEGATIVE   Leukocytes, UA SMALL (*) NEGATIVE  CBC WITH DIFFERENTIAL      Result Value Range   WBC 8.2  4.0 - 10.5 K/uL   RBC 4.38  3.87 - 5.11 MIL/uL   Hemoglobin 13.7  12.0 - 15.0 g/dL   HCT 16.1  09.6 - 04.5 %   MCV 94.3  78.0 - 100.0 fL   MCH 31.3  26.0 - 34.0 pg   MCHC 33.2  30.0 - 36.0 g/dL   RDW 40.9  81.1 - 91.4 %   Platelets 238  150 - 400 K/uL   Neutrophils Relative % 81 (*) 43 - 77 %   Neutro Abs 6.6  1.7 - 7.7 K/uL   Lymphocytes Relative 15  12 - 46 %   Lymphs Abs 1.2  0.7 - 4.0 K/uL   Monocytes Relative 4  3 - 12 %   Monocytes Absolute 0.3  0.1 - 1.0 K/uL   Eosinophils Relative 1   0 - 5 %   Eosinophils Absolute 0.1  0.0 - 0.7 K/uL   Basophils Relative 0  0 - 1 %   Basophils Absolute 0.0  0.0 - 0.1 K/uL  BASIC METABOLIC PANEL      Result Value Range   Sodium 139  135 - 145 mEq/L   Potassium 4.4  3.5 - 5.1 mEq/L   Chloride 102  96 - 112 mEq/L   CO2 29  19 - 32 mEq/L   Glucose, Bld 185 (*) 70 - 99 mg/dL   BUN 18  6 - 23 mg/dL   Creatinine, Ser 7.82  0.50 - 1.10 mg/dL   Calcium 95.6  8.4 - 21.3 mg/dL   GFR calc non Af Amer 55 (*) >90 mL/min   GFR calc Af Amer 63 (*) >90 mL/min  URINE MICROSCOPIC-ADD ON      Result Value Range   Squamous Epithelial / LPF RARE  RARE  WBC, UA 3-6  <3 WBC/hpf   RBC / HPF 0-2  <3 RBC/hpf   Bacteria, UA FEW (*) RARE   Urine-Other AMORPHOUS URATES/PHOSPHATES     Ct Abdomen Pelvis Wo Contrast  01/31/2013   *RADIOLOGY REPORT*  Clinical Data: Left flank pain.  History of hysterectomy and endometrial carcinoma.  CT ABDOMEN AND PELVIS WITHOUT CONTRAST  Technique:  Multidetector CT imaging of the abdomen and pelvis was performed following the standard protocol without intravenous contrast.  Comparison: CT of the abdomen and pelvis Peyten Weare 12, 2012  Findings: The limited view of the lung bases are clear. 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  The kidneys orthotopic demonstrate normal size, morphology. Extrarenal pelvis on the right.  No nephrolithiasis or hydronephrosis.  Limited assessment for renal mass on this noncontrast examination.  The unopacified ureters are normal in course and caliber.  Urinary bladder is partially distended, non suspicious.  Small amount of predominate right upper quadrant ascites (11 HU). There are multiple loops of mildly distended small bowel in the central abdomen, measuring up to 2.5 cm.  Tiny calcification versus surgical clip along the posterior wall of one of the loops of bowel in the pelvis.  Large bowel is unremarkable, normal appendix.  The liver, spleen, pancreas, gallbladder and adrenal glands are not  suspicious for this noncontrast examination.  Moderate calcific atherosclerosis of the aorta, the vessels are overall normal in course and caliber.  The left anterior abdominal wall subcutaneous nodule now is seen as scarring without discrete mass.  Fracture of the left pubic body extending to the inferior pubic ramus, in addition to out a left superior pubic ramus fracture, the underlying bone appears somewhat sclerotic and heterogeneous. Patchy areas of sclerosis within the sacrum appears new  IMPRESSION: Mildly prominent fluid filled small bowel (2.5 cm) in the pelvis without discrete transition point on this nonenhanced examination, this could reflect early small bowel obstruction, possibly from adhesions.  No bowel perforation.  New small amount of ascites in the right upper quadrant predominately.  Ill-defined sclerotic bone mineral density in the left pubic bone, sacrum, with apparent pathologic fracture of left superior and inferior pubic rami.  Findings discussed with and reconfirmed by Dr. Nicanor Alcon on January 31, 2013 at approximately 0410 hours.   Original Report Authenticated By: Awilda Metro    MDM Reviewed: previous chart, nursing note and vitals Reviewed previous: labs Interpretation: CT scan Consults: admitting MD and general surgery  CRITICAL CARE Performed by: Jasmine Awe Total critical care time: 31 minutes Critical care time was exclusive of separately billable procedures and treating other patients. Critical care was necessary to treat or prevent imminent or life-threatening deterioration. Critical care was time spent personally by me on the following activities: development of treatment plan with patient and/or surrogate as well as nursing, discussions with consultants, evaluation of patient's response to treatment, examination of patient, obtaining history from patient or surrogate, ordering and performing treatments and interventions, ordering and review of  laboratory studies, ordering and review of radiographic studies, pulse oximetry and re-evaluation of patient's condition.    Case d/w Dr. Daphine Deutscher of surgery no indication for surgery at this time.  Will consult, please admit to medicine  Case d/w Dr. Felipa Eth on call for Dr. Wylene Simmer.  Will call oncology per request of Dr. Felipa Eth.  Holding orders placed per request, admitting attending to be paged immediately on arrival to floor.  [facility] contacted  Judythe Postema Smitty Cords, MD 01/31/13 [phone]

## 2013-01-31 NOTE — ED Notes (Signed)
Patient transported to CT 

## 2013-01-31 NOTE — Progress Notes (Signed)
Called Dr. Wylene Simmer office to notify of patient's arrival.  Told to call back at 8:30 by answering service when the office opens up. Vwilliams,rn.

## 2013-01-31 NOTE — Consult Note (Signed)
General Surgery [facility]  Patient seen and examined.  Discussed with Dr. Wenda Low and Will Lamont.  Reviewed notes from Dr. Felipa Eth and Dr. Darrold Span.  Review radiographic studies and discussed with patient.  Agree with notifying Dr. Nelly Rout and Gyn-Onc service of their patient's admission.  Partial SBO versus ileus.  Clinically improving with bowel rest and IV hydration.  Plan repeat AXR in AM 10/10.  Will follow with you.  No role for operative intervention at this point.  Velora Heckler, MD, [facility] Office: [phone]

## 2013-01-31 NOTE — Consult Note (Signed)
Reason for Consult; SB0 Referring Physician: Hoyle Sauer, MD   [name] is an 72 y.o. female.  HPI: 72 y.o. female with history of endometrial cancer who is s/p radiation and chemotherapy, along with robotic assisted hysterectomy.  She has recently been cancer free for 2 years.  She was well till yesterday when she had some diarrhea and then later in the evening started having nausea and vomiting. She also had abdominal pain which she describes as in the upper right and left abdomen.  This persisted from 9 PM to MN, and she ask some friends to take her to the ER for evaluation and relief.  In the Er she was treated for pain, and nausea.  Her symptoms improved and she has not had any further nausea/vomiting since about 6AM this morning. Currently she is pain free, with no nausea or vomiting.  Work up in the ER at [facility], showed essentially normal labs.  CT scan showed some RUQ ascites and some small bowel distension, in multiple loops up to 2.5 cm.  She was admitted for observation and treatment.  We were ask to see in consult.  Past Medical History  Diagnosis Date  . Status post chemotherapy     carboplatin/paclitaxel x 6 rounds  . Hypercholesterolemia   . History of colonic polyps   . Bursitis of shoulder, left   . Cancer     Endometrial ca/Recurrence  . S/P radiation therapy July 29, 2010-Sep 06, 2010    External beam of pelvis  . S/P radiation therapy 09/15/10, 09/24/10, 09/28/2010    Intracavitary brachytherapy  . Lumbar pain   . Hypercholesteremia   . Allergy   . Hypertension     Past Surgical History  Procedure Laterality Date  . Abdominal hysterectomy    . Robotic assisted laparoscopic vaginal hysterectomy with fibroid removal  July 2010    bilat. salpingo-oophorectomy  . Breast lumpectomy  1993    Left breast, benign    Family History  Problem Relation Age of Onset  . Breast cancer Mother   . Heart disease Father   . Breast cancer Sister   . Diabetes Sister      Social History:  reports that she quit smoking about 36 years ago. Her smoking use included Cigarettes. She smoked 1.00 pack per day. She has never used smokeless tobacco. She reports that she does not drink alcohol or use illicit drugs. Tobacco:  About 11-12 years none since age 89 Etoh:  Social Drugs:  None Work:  Nutritional therapist Allergies:  Allergies  Allergen Reactions  . Phenobarbital     Feeling of uneasiness    Medications:  Prior to Admission:  Prescriptions prior to admission  Medication Sig Dispense Refill  . aspirin 325 MG tablet Take 325 mg by mouth daily.      . benazepril (LOTENSIN) 20 MG tablet Take 20 mg by mouth daily.      . calcium carbonate (OS-CAL) 600 MG TABS Take 600 mg by mouth 2 (two) times daily with a meal. With vitamin d      . cholecalciferol (VITAMIN D) 1000 UNITS tablet Take 1,000 Units by mouth daily.      . diclofenac sodium (VOLTAREN) 1 % GEL Apply topically 2 (two) times daily as needed.      . docusate sodium (COLACE) 100 MG capsule Take 100 mg by mouth 2 (two) times daily as needed.      . fexofenadine (ALLEGRA) 60 MG tablet Take 60 mg by  mouth daily.      . fish oil-omega-3 fatty acids 1000 MG capsule Take 1,200 mg by mouth daily.      . Folic Acid-Vit B6-Vit B12 (FOLBEE) 2.5-25-1 MG TABS Take 1 tablet by mouth daily.      Marland Kitchen ibuprofen (ADVIL,MOTRIN) 200 MG tablet Take 200 mg by mouth every 6 (six) hours as needed.      . meclizine (ANTIVERT) 25 MG tablet Take 25 mg by mouth 3 (three) times daily as needed.      . Multiple Vitamin (MULTIVITAMIN) tablet Take 1 tablet by mouth daily. Centrum silver      . raloxifene (EVISTA) 60 MG tablet Take 60 mg by mouth daily.      . simvastatin (ZOCOR) 40 MG tablet Take 40 mg by mouth every evening.       No current facility-administered medications on file prior to encounter.   Current Outpatient Prescriptions on File Prior to Encounter  Medication Sig Dispense Refill  . aspirin 325 MG tablet Take  325 mg by mouth daily.      . benazepril (LOTENSIN) 20 MG tablet Take 20 mg by mouth daily.      . calcium carbonate (OS-CAL) 600 MG TABS Take 600 mg by mouth 2 (two) times daily with a meal. With vitamin d      . cholecalciferol (VITAMIN D) 1000 UNITS tablet Take 1,000 Units by mouth daily.      . diclofenac sodium (VOLTAREN) 1 % GEL Apply topically 2 (two) times daily as needed.      . docusate sodium (COLACE) 100 MG capsule Take 100 mg by mouth 2 (two) times daily as needed.      . fexofenadine (ALLEGRA) 60 MG tablet Take 60 mg by mouth daily.      . fish oil-omega-3 fatty acids 1000 MG capsule Take 1,200 mg by mouth daily.      . Folic Acid-Vit B6-Vit B12 (FOLBEE) 2.5-25-1 MG TABS Take 1 tablet by mouth daily.      Marland Kitchen ibuprofen (ADVIL,MOTRIN) 200 MG tablet Take 200 mg by mouth every 6 (six) hours as needed.      . meclizine (ANTIVERT) 25 MG tablet Take 25 mg by mouth 3 (three) times daily as needed.      . Multiple Vitamin (MULTIVITAMIN) tablet Take 1 tablet by mouth daily. Centrum silver      . raloxifene (EVISTA) 60 MG tablet Take 60 mg by mouth daily.      . simvastatin (ZOCOR) 40 MG tablet Take 40 mg by mouth every evening.        Results for orders placed during the hospital encounter of 01/31/13 (from the past 48 hour(s))  URINALYSIS, ROUTINE W REFLEX MICROSCOPIC     Status: Abnormal   Collection Time    01/31/13  1:55 AM      Result Value Range   Color, Urine YELLOW  YELLOW   APPearance TURBID (*) CLEAR   Specific Gravity, Urine 1.021  1.005 - 1.030   pH 6.5  5.0 - 8.0   Glucose, UA NEGATIVE  NEGATIVE mg/dL   Hgb urine dipstick NEGATIVE  NEGATIVE   Bilirubin Urine NEGATIVE  NEGATIVE   Ketones, ur 15 (*) NEGATIVE mg/dL   Protein, ur NEGATIVE  NEGATIVE mg/dL   Urobilinogen, UA 0.2  0.0 - 1.0 mg/dL   Nitrite NEGATIVE  NEGATIVE   Leukocytes, UA SMALL (*) NEGATIVE  URINE MICROSCOPIC-ADD ON     Status: Abnormal   Collection Time  01/31/13  1:55 AM      Result Value Range    Squamous Epithelial / LPF RARE  RARE   WBC, UA 3-6  <3 WBC/hpf   RBC / HPF 0-2  <3 RBC/hpf   Bacteria, UA FEW (*) RARE   Urine-Other AMORPHOUS URATES/PHOSPHATES    HEPATIC FUNCTION PANEL     Status: None   Collection Time    01/31/13  2:20 AM      Result Value Range   Total Protein 6.8  6.0 - 8.3 g/dL   Albumin 3.9  3.5 - 5.2 g/dL   AST 26  0 - 37 U/L   ALT 24  0 - 35 U/L   Alkaline Phosphatase 75  39 - 117 U/L   Total Bilirubin 0.4  0.3 - 1.2 mg/dL   Bilirubin, Direct <4.5  0.0 - 0.3 mg/dL   Indirect Bilirubin NOT CALCULATED  0.3 - 0.9 mg/dL  LIPASE, BLOOD     Status: None   Collection Time    01/31/13  2:20 AM      Result Value Range   Lipase 24  11 - 59 U/L  CBC WITH DIFFERENTIAL     Status: Abnormal   Collection Time    01/31/13  2:25 AM      Result Value Range   WBC 8.2  4.0 - 10.5 K/uL   RBC 4.38  3.87 - 5.11 MIL/uL   Hemoglobin 13.7  12.0 - 15.0 g/dL   HCT 40.9  81.1 - 91.4 %   MCV 94.3  78.0 - 100.0 fL   MCH 31.3  26.0 - 34.0 pg   MCHC 33.2  30.0 - 36.0 g/dL   RDW 78.2  95.6 - 21.3 %   Platelets 238  150 - 400 K/uL   Neutrophils Relative % 81 (*) 43 - 77 %   Neutro Abs 6.6  1.7 - 7.7 K/uL   Lymphocytes Relative 15  12 - 46 %   Lymphs Abs 1.2  0.7 - 4.0 K/uL   Monocytes Relative 4  3 - 12 %   Monocytes Absolute 0.3  0.1 - 1.0 K/uL   Eosinophils Relative 1  0 - 5 %   Eosinophils Absolute 0.1  0.0 - 0.7 K/uL   Basophils Relative 0  0 - 1 %   Basophils Absolute 0.0  0.0 - 0.1 K/uL  BASIC METABOLIC PANEL     Status: Abnormal   Collection Time    01/31/13  2:25 AM      Result Value Range   Sodium 139  135 - 145 mEq/L   Potassium 4.4  3.5 - 5.1 mEq/L   Chloride 102  96 - 112 mEq/L   CO2 29  19 - 32 mEq/L   Glucose, Bld 185 (*) 70 - 99 mg/dL   BUN 18  6 - 23 mg/dL   Creatinine, Ser 0.86  0.50 - 1.10 mg/dL   Calcium 57.8  8.4 - 46.9 mg/dL   GFR calc non Af Amer 55 (*) >90 mL/min   GFR calc Af Amer 63 (*) >90 mL/min   Comment: (NOTE)     The eGFR has been  calculated using the CKD EPI equation.     This calculation has not been validated in all clinical situations.     eGFR's persistently <90 mL/min signify possible Chronic Kidney     Disease.  CBC     Status: None   Collection Time    01/31/13  2:57  PM      Result Value Range   WBC 7.6  4.0 - 10.5 K/uL   RBC 3.94  3.87 - 5.11 MIL/uL   Hemoglobin 12.2  12.0 - 15.0 g/dL   HCT 16.1  09.6 - 04.5 %   MCV 92.6  78.0 - 100.0 fL   MCH 31.0  26.0 - 34.0 pg   MCHC 33.4  30.0 - 36.0 g/dL   RDW 40.9  81.1 - 91.4 %   Platelets 247  150 - 400 K/uL    Ct Abdomen Pelvis Wo Contrast  01/31/2013   *RADIOLOGY REPORT*  Clinical Data: Left flank pain.  History of hysterectomy and endometrial carcinoma.  CT ABDOMEN AND PELVIS WITHOUT CONTRAST  Technique:  Multidetector CT imaging of the abdomen and pelvis was performed following the standard protocol without intravenous contrast.  Comparison: CT of the abdomen and pelvis August 05, 2010  Findings: The limited view of the lung bases are clear. 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  The kidneys orthotopic demonstrate normal size, morphology. Extrarenal pelvis on the right.  No nephrolithiasis or hydronephrosis.  Limited assessment for renal mass on this noncontrast examination.  The unopacified ureters are normal in course and caliber.  Urinary bladder is partially distended, non suspicious.  Small amount of predominate right upper quadrant ascites (11 HU). There are multiple loops of mildly distended small bowel in the central abdomen, measuring up to 2.5 cm.  Tiny calcification versus surgical clip along the posterior wall of one of the loops of bowel in the pelvis.  Large bowel is unremarkable, normal appendix.  The liver, spleen, pancreas, gallbladder and adrenal glands are not suspicious for this noncontrast examination.  Moderate calcific atherosclerosis of the aorta, the vessels are overall normal in course and caliber.  The left anterior abdominal wall  subcutaneous nodule now is seen as scarring without discrete mass.  Fracture of the left pubic body extending to the inferior pubic ramus, in addition to out a left superior pubic ramus fracture, the underlying bone appears somewhat sclerotic and heterogeneous. Patchy areas of sclerosis within the sacrum appears new  IMPRESSION: Mildly prominent fluid filled small bowel (2.5 cm) in the pelvis without discrete transition point on this nonenhanced examination, this could reflect early small bowel obstruction, possibly from adhesions.  No bowel perforation.  New small amount of ascites in the right upper quadrant predominately.  Ill-defined sclerotic bone mineral density in the left pubic bone, sacrum, with apparent pathologic fracture of left superior and inferior pubic rami.  Findings discussed with and reconfirmed by Dr. Nicanor Alcon on January 31, 2013 at approximately 0410 hours.   Original Report Authenticated By: Awilda Metro    Review of Systems  Constitutional: Negative.   HENT: Negative.   Eyes: Negative.   Respiratory: Positive for cough (she has some cough but attirbutes it to allergies.). Negative for hemoptysis, sputum production, shortness of breath (she walks with neighbor nightly about 1 mile.) and wheezing.   Cardiovascular: Positive for leg swelling (some if she sits a long time.). Negative for chest pain, orthopnea and claudication.  Gastrointestinal: Positive for heartburn (occasional), nausea (last PM till around 6AM after treatment in ER), vomiting (last emesis about 6 AM after rx in ER), abdominal pain (uppper abdominal right and left) and diarrhea (1 episode of diarrhea yesterday afternoon).  Genitourinary: Negative.   Musculoskeletal: Positive for back pain (hx of lumbar disease).  Skin: Negative.   Endo/Heme/Allergies: Positive for environmental allergies. Negative for polydipsia. Does  not bruise/bleed easily.  Psychiatric/Behavioral: Negative.    Blood pressure 121/54, pulse  90, temperature 98.6 F (37 C), temperature source Oral, resp. rate 20, height 5\' 6"  (1.676 m), weight 72.122 kg (159 lb), SpO2 100.00%. Physical Exam  Constitutional: She appears well-developed and well-nourished. No distress.  HENT:  Head: Normocephalic and atraumatic.  Nose: Nose normal.  Eyes: Conjunctivae and EOM are normal. Pupils are equal, round, and reactive to light. Left eye exhibits no discharge. No scleral icterus.  Neck: Normal range of motion. Neck supple. No JVD present. No tracheal deviation present. No thyromegaly present.  Cardiovascular: Normal rate, regular rhythm, normal heart sounds and intact distal pulses.  Exam reveals no gallop.   No murmur heard. Respiratory: Effort normal and breath sounds normal. No respiratory distress. She has no wheezes. She has no rales. She exhibits no tenderness.  GI: She exhibits no mass. Distention: minimal distension, she says it's better now than last PM when she went to ER. There is no tenderness. There is no rebound and no guarding.  She has some scars from lap hysterectomy, and some darker colored skin at site of radiation rx  Genitourinary: Guaiac negative stool.  Musculoskeletal: She exhibits no edema.  Lymphadenopathy:    She has no cervical adenopathy.  Neurological: She is alert.  Skin: Skin is warm and dry. No rash noted. She is not diaphoretic. No erythema. No pallor.  Psychiatric: She has a normal mood and affect. Her behavior is normal. Judgment and thought content normal.    Assessment/Plan: 1.  Nausea, vomiting, abdominal pain.  SBO vs enteritis 2.  Hx of recurrent endometrial cancer with chemotherapy and radiation therapy,  07/2010 3.  Hx of Robotic assisted laparoscopic vaginal hysterectomy for fibroids/BSSO, 10/2008 4.  Hypertension 5.  Hypercholesterolemia 6.  Hx of bursitis left shoulder 7.  Hx of some back pain  Plan:  Pt is doing better now, no nausea, vomiting or diarrhea.  She feels less distended now than  last PM.  I would continue her NPO, bowel rest, and hydration.  I will recheck film in Am and we will follow with you.   [name] 01/31/2013, 3:25 PM

## 2013-01-31 NOTE — H&P (Signed)
PCP:   Gaspar Garbe, MD   Chief Complaint:  Nausea vomiting with abdominal pain  HPI: This is a very pleasant 72 year old female who lives alone, and does have a past medical history significant for endometrial cancer status post surgery, chemotherapy and radiation treatment complicated by historical recurrence. She continues to be followed up by gynecological oncology on a regular basis including radiation oncology, with no evidence of recurrence indeed has been seen within the last one week with physical exam unremarkable. Dr. Darrold Span, who has seen her in the past, graciously agreed to see the patient this morning and she has a full note evaluation in the chart which is reviewed for this H&P. Please see her note extensive details regarding the patient's oncology history. 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 Given persistence of symptoms, she presented to the emergency room where CT scan of the abdomen and pelvis revealed small right upper quadrant ascites, multiple loops of, with no discrete transition point, mildly distended small bowel up to 2.5 cm.question of fractures within the pelvis, question pathologic fractures based on radiology report, surgery was counseled by telephone, and he deferred aggressive management based on symptomatology and presenting findings, but would be available if needed. I was called, and patient was subsequent transferred from [facility] emergency room to [facility] long with temporary orders that were to include IV fluids however this was not continued once the patient presented on the floor. Currently the patient is asymptomatic,  with some minimal abdominal discomfort, patient has not received any additional pain medications since she's been on the floor at approximately 7 AM this morning. She denies any recurrent nausea, has voided without complications or dysuria. Denies any further fevers or chills nor does she have any in the last 24 hours. She may have passed a small amount of flatus but she is unclear.  Review of Systems:  Negative for fevers, chills, positive for nausea and vomiting however no recurrence since admission to the floor, no diarrhea since admission to the floor, no blood in vomitus, abdominal discomfort present but not significant enough to prevent her from eating comfortable lying in bed, denies any focal neurologic deficits, chest pain, shortness of breath, new musculoskeletal pain. She does have some chronic issues with sciatica, his are quite minimal, she denies any weight changes, denies any dysuria    Past Medical History: Past Medical History  Diagnosis Date  . Status post chemotherapy     carboplatin/paclitaxel x 6 rounds  . Hypercholesterolemia   . History of colonic polyps   . Bursitis of shoulder, left   . Cancer     Endometrial ca/Recurrence  . S/P radiation therapy July 29, 2010-Sep 06, 2010    External beam of pelvis  . S/P radiation therapy 09/15/10, 09/24/10, 09/28/2010    Intracavitary brachytherapy  . Lumbar pain   . Hypercholesteremia   . Allergy   . Hypertension    Past Surgical History  Procedure Laterality Date  . Abdominal hysterectomy    . Robotic assisted laparoscopic vaginal hysterectomy with fibroid removal  July 2010    bilat. salpingo-oophorectomy  . Breast lumpectomy  1993    Left breast, benign    Medications: Prior  to Admission medications   Medication Sig Start Date End Date Taking? Authorizing Provider  aspirin 325 MG tablet Take 325 mg by mouth daily.   Yes Historical Provider, MD  benazepril (LOTENSIN) 20 MG tablet Take 20 mg by mouth daily.   Yes  Historical Provider, MD  calcium carbonate (OS-CAL) 600 MG TABS Take 600 mg by mouth 2 (two) times daily with a meal. With vitamin d   Yes Historical Provider, MD  cholecalciferol (VITAMIN D) 1000 UNITS tablet Take 1,000 Units by mouth daily.   Yes Historical Provider, MD  diclofenac sodium (VOLTAREN) 1 % GEL Apply topically 2 (two) times daily as needed.   Yes Historical Provider, MD  docusate sodium (COLACE) 100 MG capsule Take 100 mg by mouth 2 (two) times daily as needed.   Yes Historical Provider, MD  fexofenadine (ALLEGRA) 60 MG tablet Take 60 mg by mouth daily.   Yes Historical Provider, MD  fish oil-omega-3 fatty acids 1000 MG capsule Take 1,200 mg by mouth daily.   Yes Historical Provider, MD  Folic Acid-Vit B6-Vit B12 (FOLBEE) 2.5-25-1 MG TABS Take 1 tablet by mouth daily.   Yes Historical Provider, MD  ibuprofen (ADVIL,MOTRIN) 200 MG tablet Take 200 mg by mouth every 6 (six) hours as needed.   Yes Historical Provider, MD  meclizine (ANTIVERT) 25 MG tablet Take 25 mg by mouth 3 (three) times daily as needed.   Yes Historical Provider, MD  Multiple Vitamin (MULTIVITAMIN) tablet Take 1 tablet by mouth daily. Centrum silver   Yes Historical Provider, MD  raloxifene (EVISTA) 60 MG tablet Take 60 mg by mouth daily.   Yes Historical Provider, MD  simvastatin (ZOCOR) 40 MG tablet Take 40 mg by mouth every evening.   Yes Historical Provider, MD    Allergies:   Allergies  Allergen Reactions  . Phenobarbital     Feeling of uneasiness    Social History:  reports that she quit smoking about 36 years ago. Her smoking use included Cigarettes. She smoked 1.00 pack per day. She has never used smokeless tobacco. She reports that she does not drink alcohol or use illicit drugs.  Family History: Family History  Problem Relation Age of Onset  . Breast cancer Mother   . Heart disease Father   . Breast cancer Sister   . Diabetes Sister     Physical Exam: Filed Vitals:   01/31/13 0151  01/31/13 0432 01/31/13 0630 01/31/13 0726  BP: 165/80 146/81 133/64 154/83  Pulse: 110 99 79 100  Temp: 97.5 F (36.4 C)  97.4 F (36.3 C) 98.1 F (36.7 C)  TempSrc: Oral  Oral Oral  Resp: 18 20 20 18   Height: 5\' 6"  (1.676 m)  5\' 6"  (1.676 m)   Weight: 72.122 kg (159 lb)     SpO2: 100% 97% 97% 94%    Pleasant, no apparent distress, answering all questions appropriately, no respiratory distress Face symmetrical, no oropharyngeal lesions, tongue midline, sclera anicteric extraconal movements are intact Neck supple, no cervical lymphadenopathy, no thyromegaly Lungs clear to auscultation bilaterally X line cardia vascular reveals regular rate and rhythm without murmurs auscultated Abdominal exam has a soft, nondistended abdomen, now sounds are present, and there subjective tenderness mostly in the mid section. Umbilical, no hepatosplenomegaly is appreciated Lower extremity does not reveal any edema, cyanosis, cords, tenderness, No active synovitis in bilateral lower cavities or upper extremities No apparent rashes No axillary lymphadenopathy, no pain with movement of bilateral hips Patient alert and oriented x4, cranial  nerves grossly intact, normal tone in all 4 extremities, no resting tremors, strength intact     Labs on Admission:   Recent Labs  01/31/13 0225  NA 139  K 4.4  CL 102  CO2 29  GLUCOSE 185*  BUN 18  CREATININE 1.00  CALCIUM 10.0    Recent Labs  01/31/13 0220  AST 26  ALT 24  ALKPHOS 75  BILITOT 0.4  PROT 6.8  ALBUMIN 3.9    Recent Labs  01/31/13 0220  LIPASE 24    Recent Labs  01/31/13 0225  WBC 8.2  NEUTROABS 6.6  HGB 13.7  HCT 41.3  MCV 94.3  PLT 238   No results found for this basename: CKTOTAL, CKMB, CKMBINDEX, TROPONINI,  in the last 72 hours No results found for this basename: TSH, T4TOTAL, FREET3, T3FREE, THYROIDAB,  in the last 72 hours No results found for this basename: VITAMINB12, FOLATE, FERRITIN, TIBC, IRON, RETICCTPCT,   in the last 72 hours  Radiological Exams on Admission: Ct Abdomen Pelvis Wo Contrast  01/31/2013   *RADIOLOGY REPORT*  Clinical Data: Left flank pain.  History of hysterectomy and endometrial carcinoma.  CT ABDOMEN AND PELVIS WITHOUT CONTRAST  Technique:  Multidetector CT imaging of the abdomen and pelvis was performed following the standard protocol without intravenous contrast.  Comparison: CT of the abdomen and pelvis August 05, 2010  Findings: The limited view of the lung bases are clear. 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  The kidneys orthotopic demonstrate normal size, morphology. Extrarenal pelvis on the right.  No nephrolithiasis or hydronephrosis.  Limited assessment for renal mass on this noncontrast examination.  The unopacified ureters are normal in course and caliber.  Urinary bladder is partially distended, non suspicious.  Small amount of predominate right upper quadrant ascites (11 HU). There are multiple loops of mildly distended small bowel in the central abdomen, measuring up to 2.5 cm.  Tiny calcification versus surgical clip along the posterior wall of one of the loops of bowel in the pelvis.  Large bowel is unremarkable, normal appendix.  The liver, spleen, pancreas, gallbladder and adrenal glands are not suspicious for this noncontrast examination.  Moderate calcific atherosclerosis of the aorta, the vessels are overall normal in course and caliber.  The left anterior abdominal wall subcutaneous nodule now is seen as scarring without discrete mass.  Fracture of the left pubic body extending to the inferior pubic ramus, in addition to out a left superior pubic ramus fracture, the underlying bone appears somewhat sclerotic and heterogeneous. Patchy areas of sclerosis within the sacrum appears new  IMPRESSION: Mildly prominent fluid filled small bowel (2.5 cm) in the pelvis without discrete transition point on this nonenhanced examination, this could reflect early small bowel  obstruction, possibly from adhesions.  No bowel perforation.  New small amount of ascites in the right upper quadrant predominately.  Ill-defined sclerotic bone mineral density in the left pubic bone, sacrum, with apparent pathologic fracture of left superior and inferior pubic rami.  Findings discussed with and reconfirmed by Dr. Nicanor Alcon on January 31, 2013 at approximately 0410 hours.   Original Report Authenticated By: Awilda Metro   No orders found for this or any previous visit.  Assessment/Plan Possible small bowel obstruction based on CT scan and history, possibly complicated by adhesions from previous surgery and radiation therapy, will treat conservatively at this time with IV fluids, bowel rest, will recheck KUB in the morning, minimize pain medications if possible, none needed in the  last 6-8 hours, if symptoms worsen, may need general surgery consultation.   Endometrial cancer IIIa grade 1, diagnosed 2010 with evidence of recurrence 1 year later treated with combination of surgery, chemotherapy and radiation therapy, based on clinical exam and Pap smears, no evidence of recurrence however small amount of ascites based on CT scan as well as pelvic fractures are concerning, may need PET scan per Dr. Darrold Span, labs unremarkable with reasonable renal and liver parameters and blood counts with no lateral and abnormalities, pancreatic enzymes unremarkable Pubic fractures, question etiology, patient asymptomatic at this time, again we'll workup on an outpatient basis unless new symptoms arise or patient decompensates Hypertension-we'll monitor, and provide IV medications if needed Questionable UTI, patient asymptomatic, we'll continue Rocephin already initiated however skeptical with regards to diagnosis   Brighten Buzzelli R 01/31/2013, 1:17 PM

## 2013-01-31 NOTE — Consult Note (Signed)
Medical Oncology  Reason for Consult:possible early small bowel obstruction, with history of endometrial cancer not known active.  Referring Physician: ED physician at request of Dr Felipa Eth, on call for Dr Wylene Simmer  Other physicians: Dr Laurette Schimke, Dr Antony Blackbird, Dr Worthy Rancher HPI [name] is an 72 y.o. female with history of endometrial cancer which is not known active presently, admitted with probable small bowel obstruction of <24 hours duration. She is seen in consultation now at request of admitting service, for oncology recommendations. I saw the patient last in ~ 04-2010; she has been followed regularly since then by Drs Nelly Rout of gyn oncology and Kinard of radiation oncology, alternating visits every 3 months. She saw Dr Nelly Rout last on 01-24-13, with unremarkable exam; last PAP by Dr Roselind Messier was normal in July 2014. Patient had been feeling entirely well other than some occasional diarrhea, which she has had since radiation therapy, last episode of diarrhea within 24 hours prior to onset of the new abdominal pain which resolved as usual with prn imodium. She had vegetables for lunch on 01-30-13 (black eye peas, collards, okra, tomatoes), then developed upper abdominal gas-like discomfort at ~ 5-6 PM. This was different discomfort from any previous symptoms, persisted over next hours with vomiting X ~6 from 9 PM until MN. Vomitus was food from lunch. She had small BM during that time, no fever. Discomfort was worse by ~ 0100 on 01-31-13, at which time she presented to ED. CT abdomen pelvis in ED showed small RUQ ascites, multiple loops of mildly distended small bowel up to 2.5 cm in central abdomen, no discrete transition point, unremarkable large bowel, fracture of left pubic body and left superior pubic ramus as well as some patchy areas of sclerosis in sacrum. Per ED physician note, situation discussed with general surgery, who did not feel there was need for invasive intervention  then. Labs have WBC 8.2, Hgb 13.7, BMET and LFTs normal except glucose 185, lipase 24, UA with some bacteria and leukocytes and culture pending.She was given zofran and fentanyl in ED with improvement in symptoms. She has been NPO since arrival in ED, did have IVF in ED which have not been resumed in hospital as yet.   Oncologic History Patient was initially diagnosed with IIIA grade 1 endometrial adenocarcinoma in July 2010, when she presented to Dr Floyde Parkins with vaginal bleeding. She had robotic hysterectomy with BSO and bilateral pelvic lymph node evaluation (8 nodes negative on right and 17 negative on left). She was treated on GOG protocol 258 with 6 cycles of taxol and carboplatin thru January 2011 (no RT on that arm of the protocol). In ~ Nov  2011 she was found to have vaginal recurrence as well as obturator mass LUQ at port site used for the robotic surgery, and enlarged obturator nodes. She received additional taxol carboplatin x 4 cycles thru Feb 2012, then external beam radiation to pelvis to 4500 cGy from 07-29-10 thru 09-06-10, with intracavitary brachytherapy x 3 completed 09-28-10. Last CT AP before study today was 07-12-10. She saw Dr Roselind Messier July 2014 and Dr Nelly Rout early Oct 2014.   ROS No further vomiting, no diarrhea. Slight upper abdominal discomfort now, not nearly as severe as during night. Does not feel abdomen is distended. Has not voided since arrival to hospital. Slight environmental allergy symptoms, no lower respiratory symptoms. No other pain. No bleeding. Minimal occasional LLE pain since LS injection by Dr Darrelyn Hillock June 2014 for pain radiating to left leg. Weight  has been stable at ~ 160 lbs.  She has had flu vaccine this fall. She is due physical exam by Dr Wylene Simmer in early Nov.  Allergies:  Allergies  Allergen Reactions  . Phenobarbital     Feeling of uneasiness   Past Medical History  Diagnosis Date  . Status post chemotherapy     carboplatin/paclitaxel x 6  rounds  . Hypercholesterolemia   . History of colonic polyps   . Bursitis of shoulder, left   . Cancer     Endometrial ca/Recurrence  . S/P radiation therapy July 29, 2010-Sep 06, 2010    External beam of pelvis  . S/P radiation therapy 09/15/10, 09/24/10, 09/28/2010    Intracavitary brachytherapy  . Lumbar pain   . Hypercholesteremia   . Allergy   . Hypertension   G1P1, LMP age 85 HTN since ~ 1993 Surgery for right thigh laceration by ortho in [facility] 2007 (fell down stairs onto Community education officer)  Past Surgical History  Procedure Laterality Date  . Abdominal hysterectomy    . Robotic assisted laparoscopic vaginal hysterectomy with fibroid removal  July 2010    bilat. salpingo-oophorectomy  . Breast lumpectomy  1993    Left breast, benign    Family History  Problem Relation Age of Onset  . Breast cancer Mother   . Heart disease Father   . Breast cancer Sister   . Diabetes Sister   Mother with breast cancer age 65 Father with MI age 60, died in 5s 1 daughter healthy  Social History:  reports that she quit smoking about 36 years ago. Her smoking use included Cigarettes. She smoked 1.00 pack per day. She has never used smokeless tobacco. She reports that she does not drink alcohol or use illicit drugs. Smoked 1 ppd x 10 years quit 1977. Originally from [facility]. Widowed. 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 No grandchildren.  Medications: reviewed as listed in EMR  Blood pressure 154/83, pulse 100, temperature 98.1 F (36.7 C), temperature source Oral, resp. rate 18, height 5\' 6"  (1.676 m), weight 159 lb (72.122 kg), SpO2 94.00%. Physical Exam Delightful lady, appears stated age, good historian, looks comfortable supine in bed on RA with NSL in LUE. HEENT: normal hair pattern, PERRL, not icteric. Oral mucosa slightly dry without lesions. No JVD.  Lymphatics: no  cervical, supraclavicular, axillary, inguinal adenopathy Chest with pectus excavatum Lungs clear to bases to A and P Heart RRR no gallop No central catheter Abdomen soft, not obviously distended, some normal bowel sounds in lower quadrants bilaterally, not significantly tender thruout tho she can tell some difference in upper mid abdomen. No HSM. LE no edema, cords, tenderness. Skin without rash or petechiae NSL LUE site ok Neuro nonfocal CN, motor, sensory   Results for orders placed during the hospital encounter of 01/31/13 (from the past 48 hour(s))  URINALYSIS, ROUTINE W REFLEX MICROSCOPIC     Status: Abnormal   Collection Time    01/31/13  1:55 AM      Result Value Range   Color, Urine YELLOW  YELLOW   APPearance TURBID (*) CLEAR   Specific Gravity, Urine 1.021  1.005 - 1.030   pH 6.5  5.0 - 8.0   Glucose, UA NEGATIVE  NEGATIVE mg/dL   Hgb urine dipstick NEGATIVE  NEGATIVE   Bilirubin Urine NEGATIVE  NEGATIVE   Ketones, ur 15 (*) NEGATIVE mg/dL  Protein, ur NEGATIVE  NEGATIVE mg/dL   Urobilinogen, UA 0.2  0.0 - 1.0 mg/dL   Nitrite NEGATIVE  NEGATIVE   Leukocytes, UA SMALL (*) NEGATIVE  URINE MICROSCOPIC-ADD ON     Status: Abnormal   Collection Time    01/31/13  1:55 AM      Result Value Range   Squamous Epithelial / LPF RARE  RARE   WBC, UA 3-6  <3 WBC/hpf   RBC / HPF 0-2  <3 RBC/hpf   Bacteria, UA FEW (*) RARE   Urine-Other AMORPHOUS URATES/PHOSPHATES    HEPATIC FUNCTION PANEL     Status: None   Collection Time    01/31/13  2:20 AM      Result Value Range   Total Protein 6.8  6.0 - 8.3 g/dL   Albumin 3.9  3.5 - 5.2 g/dL   AST 26  0 - 37 U/L   ALT 24  0 - 35 U/L   Alkaline Phosphatase 75  39 - 117 U/L   Total Bilirubin 0.4  0.3 - 1.2 mg/dL   Bilirubin, Direct <1.6  0.0 - 0.3 mg/dL   Indirect Bilirubin NOT CALCULATED  0.3 - 0.9 mg/dL  LIPASE, BLOOD     Status: None   Collection Time    01/31/13  2:20 AM      Result Value Range   Lipase 24  11 - 59 U/L  CBC  WITH DIFFERENTIAL     Status: Abnormal   Collection Time    01/31/13  2:25 AM      Result Value Range   WBC 8.2  4.0 - 10.5 K/uL   RBC 4.38  3.87 - 5.11 MIL/uL   Hemoglobin 13.7  12.0 - 15.0 g/dL   HCT 10.9  60.4 - 54.0 %   MCV 94.3  78.0 - 100.0 fL   MCH 31.3  26.0 - 34.0 pg   MCHC 33.2  30.0 - 36.0 g/dL   RDW 98.1  19.1 - 47.8 %   Platelets 238  150 - 400 K/uL   Neutrophils Relative % 81 (*) 43 - 77 %   Neutro Abs 6.6  1.7 - 7.7 K/uL   Lymphocytes Relative 15  12 - 46 %   Lymphs Abs 1.2  0.7 - 4.0 K/uL   Monocytes Relative 4  3 - 12 %   Monocytes Absolute 0.3  0.1 - 1.0 K/uL   Eosinophils Relative 1  0 - 5 %   Eosinophils Absolute 0.1  0.0 - 0.7 K/uL   Basophils Relative 0  0 - 1 %   Basophils Absolute 0.0  0.0 - 0.1 K/uL  BASIC METABOLIC PANEL     Status: Abnormal   Collection Time    01/31/13  2:25 AM      Result Value Range   Sodium 139  135 - 145 mEq/L   Potassium 4.4  3.5 - 5.1 mEq/L   Chloride 102  96 - 112 mEq/L   CO2 29  19 - 32 mEq/L   Glucose, Bld 185 (*) 70 - 99 mg/dL   BUN 18  6 - 23 mg/dL   Creatinine, Ser 2.95  0.50 - 1.10 mg/dL   Calcium 62.1  8.4 - 30.8 mg/dL   GFR calc non Af Amer 55 (*) >90 mL/min   GFR calc Af Amer 63 (*) >90 mL/min   Comment: (NOTE)     The eGFR has been calculated using the CKD EPI equation.  This calculation has not been validated in all clinical situations.     eGFR's persistently <90 mL/min signify possible Chronic Kidney     Disease.    Ct Abdomen Pelvis Wo Contrast  01/31/2013   *RADIOLOGY REPORT*  Clinical Data: Left flank pain.  History of hysterectomy and endometrial carcinoma.  CT ABDOMEN AND PELVIS WITHOUT CONTRAST  Technique:  Multidetector CT imaging of the abdomen and pelvis was performed following the standard protocol without intravenous contrast.  Comparison: CT of the abdomen and pelvis August 05, 2010  Findings: The limited view of the lung bases are clear. 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  The  kidneys orthotopic demonstrate normal size, morphology. Extrarenal pelvis on the right.  No nephrolithiasis or hydronephrosis.  Limited assessment for renal mass on this noncontrast examination.  The unopacified ureters are normal in course and caliber.  Urinary bladder is partially distended, non suspicious.  Small amount of predominate right upper quadrant ascites (11 HU). There are multiple loops of mildly distended small bowel in the central abdomen, measuring up to 2.5 cm.  Tiny calcification versus surgical clip along the posterior wall of one of the loops of bowel in the pelvis.  Large bowel is unremarkable, normal appendix.  The liver, spleen, pancreas, gallbladder and adrenal glands are not suspicious for this noncontrast examination.  Moderate calcific atherosclerosis of the aorta, the vessels are overall normal in course and caliber.  The left anterior abdominal wall subcutaneous nodule now is seen as scarring without discrete mass.  Fracture of the left pubic body extending to the inferior pubic ramus, in addition to out a left superior pubic ramus fracture, the underlying bone appears somewhat sclerotic and heterogeneous. Patchy areas of sclerosis within the sacrum appears new  IMPRESSION: Mildly prominent fluid filled small bowel (2.5 cm) in the pelvis without discrete transition point on this nonenhanced examination, this could reflect early small bowel obstruction, possibly from adhesions.  No bowel perforation.  New small amount of ascites in the right upper quadrant predominately.  Ill-defined sclerotic bone mineral density in the left pubic bone, sacrum, with apparent pathologic fracture of left superior and inferior pubic rami.  Findings discussed with and reconfirmed by Dr. Nicanor Alcon on January 31, 2013 at approximately 0410 hours.   Original Report Authenticated By: Awilda Metro    Note bone lesions not called on CTs 2011 or 2012  (Mammograms at  [facility])     Assessment/Plan: 1.Probable early SBO in patient with history of surgery and RT for endometrial cancer: agree with bowel rest with IVF and least pain medication possible to control severe symptoms so as not to suppress bowel function. I will let gyn oncology know of situation. Hospitalization necessary and appropriate at present. The SBO could be from adhesions as not recurrent cancer clearly present just from the CT.  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 Not clear if there is further recurrent disease now. 3.pubic fractures and bony changes in pelvis and sacrum new from scans of 2012. ? Related to RT or other; not symptomatic now. I will let Dr Roselind Messier know. May be helpful to get PET after DC from hospital. 4.remote tobacco 5.HTN 6.has had flu vaccine   Please call between my rounds if our service can be of help.   Magdalena Skilton P 01/31/2013, 9:34 AM

## 2013-01-31 NOTE — ED Provider Notes (Signed)
Case d/w Dr. Cyndie Chime at request of Dr. Felipa Eth, oncology will see patient in consult upon admission  [name] [name], MD 01/31/13 1610

## 2013-02-01 ENCOUNTER — Other Ambulatory Visit: Payer: Self-pay | Admitting: Oncology

## 2013-02-01 ENCOUNTER — Observation Stay (HOSPITAL_COMMUNITY): Payer: Medicare Other

## 2013-02-01 DIAGNOSIS — K56609 Unspecified intestinal obstruction, unspecified as to partial versus complete obstruction: Secondary | ICD-10-CM | POA: Diagnosis not present

## 2013-02-01 DIAGNOSIS — Z8541 Personal history of malignant neoplasm of cervix uteri: Secondary | ICD-10-CM | POA: Diagnosis not present

## 2013-02-01 DIAGNOSIS — R109 Unspecified abdominal pain: Secondary | ICD-10-CM | POA: Diagnosis not present

## 2013-02-01 DIAGNOSIS — C549 Malignant neoplasm of corpus uteri, unspecified: Secondary | ICD-10-CM

## 2013-02-01 DIAGNOSIS — I10 Essential (primary) hypertension: Secondary | ICD-10-CM | POA: Diagnosis not present

## 2013-02-01 LAB — COMPREHENSIVE METABOLIC PANEL
ALT: 19 U/L (ref 0–35)
AST: 29 U/L (ref 0–37)
Albumin: 3.1 g/dL — ABNORMAL LOW (ref 3.5–5.2)
Alkaline Phosphatase: 65 U/L (ref 39–117)
CO2: 24 mEq/L (ref 19–32)
Chloride: 105 mEq/L (ref 96–112)
Creatinine, Ser: 0.92 mg/dL (ref 0.50–1.10)
GFR calc Af Amer: 70 mL/min — ABNORMAL LOW (ref >90)
GFR calc non Af Amer: 60 mL/min — ABNORMAL LOW (ref >90)
Glucose, Bld: 123 mg/dL — ABNORMAL HIGH (ref 70–99)
Potassium: 3.6 mEq/L (ref 3.5–5.1)
Sodium: 138 mEq/L (ref 135–145)
Total Bilirubin: 0.3 mg/dL (ref 0.3–1.2)

## 2013-02-01 LAB — URINE CULTURE
Colony Count: NO GROWTH
Culture: NO GROWTH

## 2013-02-01 LAB — GLUCOSE, CAPILLARY

## 2013-02-01 LAB — CBC
HCT: 39.6 % (ref 36.0–46.0)
MCH: 31 pg (ref 26.0–34.0)
MCHC: 33.1 g/dL (ref 30.0–36.0)
MCV: 93.6 fL (ref 78.0–100.0)
RDW: 13.6 % (ref 11.5–15.5)

## 2013-02-01 MED ORDER — HYDROCODONE-ACETAMINOPHEN 5-325 MG PO TABS
1.0000 | ORAL_TABLET | Freq: Four times a day (QID) | ORAL | Status: DC | PRN
  Administered 2013-02-01: 1 via ORAL
  Filled 2013-02-01: qty 1

## 2013-02-01 NOTE — Progress Notes (Signed)
Subjective: Has been on bed rest and has not had her AM abd x ray yet./  Woke up with a bit of a headache and feels a bit puffy, otherwise no nausea noted.  We reviewed our concerns given her CA history and this SBO, but no overt lesion noted.  Also reviewed the pelvic findings c/w fracture but clinically absent and reason behind restaging once she is getting back to her regular diet.  Objective: Vital signs in last 24 hours: Temp:  [98.2 F (36.8 C)-98.6 F (37 C)] 98.5 F (36.9 C) (10/10 0601) Pulse Rate:  [1-90] 1 (10/10 0601) Resp:  [16-20] 16 (10/10 0601) BP: (121-146)/(54-75) 146/75 mmHg (10/10 0601) SpO2:  [93 %-100 %] 93 % (10/10 0601) Weight change:  Last BM Date: 01/30/13  Intake/Output from previous day: 10/09 0701 - 10/10 0700 In: 0  Out: 1000 [Urine:1000] Intake/Output this shift: Total I/O In: 2310.4 [I.V.:2260.4; IV Piggyback:50] Out: -   Physical Exam:  General:  No acute distress, pleasant Lungs:  Clear to auscultation bilaterally  Heart:  No murmur, rub or gallop Abdominal: Exam has a soft, nondistended abdomen,nontender Extremities:Lower extremity does not reveal any edema. Lymphatic: No lymphadenopathy,  Neuro: Patient alert and oriented x3, cranial nerves grossly intact, normal tone in all 4 extremities, no resting tremors, strength intact    Lab Results:  Recent Labs  01/31/13 0225 01/31/13 1457  WBC 8.2 7.6  HGB 13.7 12.2  HCT 41.3 36.5  PLT 238 247   BMET  Recent Labs  01/31/13 0225 01/31/13 1457  NA 139  --   K 4.4  --   CL 102  --   CO2 29  --   GLUCOSE 185*  --   BUN 18  --   CREATININE 1.00 0.89  CALCIUM 10.0  --     Studies/Results: Ct Abdomen Pelvis Wo Contrast  01/31/2013   *RADIOLOGY REPORT*  Clinical Data: Left flank pain.  History of hysterectomy and endometrial carcinoma.  CT ABDOMEN AND PELVIS WITHOUT CONTRAST  Technique:  Multidetector CT imaging of the abdomen and pelvis was performed following the standard  protocol without intravenous contrast.  Comparison: CT of the abdomen and pelvis August 05, 2010  Findings: The limited view of the lung bases are clear. 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  The kidneys orthotopic demonstrate normal size, morphology. Extrarenal pelvis on the right.  No nephrolithiasis or hydronephrosis.  Limited assessment for renal mass on this noncontrast examination.  The unopacified ureters are normal in course and caliber.  Urinary bladder is partially distended, non suspicious.  Small amount of predominate right upper quadrant ascites (11 HU). There are multiple loops of mildly distended small bowel in the central abdomen, measuring up to 2.5 cm.  Tiny calcification versus surgical clip along the posterior wall of one of the loops of bowel in the pelvis.  Large bowel is unremarkable, normal appendix.  The liver, spleen, pancreas, gallbladder and adrenal glands are not suspicious for this noncontrast examination.  Moderate calcific atherosclerosis of the aorta, the vessels are overall normal in course and caliber.  The left anterior abdominal wall subcutaneous nodule now is seen as scarring without discrete mass.  Fracture of the left pubic body extending to the inferior pubic ramus, in addition to out a left superior pubic ramus fracture, the underlying bone appears somewhat sclerotic and heterogeneous. Patchy areas of sclerosis within the sacrum appears new  IMPRESSION: Mildly prominent fluid filled small bowel (2.5 cm) in the pelvis  without discrete transition point on this nonenhanced examination, this could reflect early small bowel obstruction, possibly from adhesions.  No bowel perforation.  New small amount of ascites in the right upper quadrant predominately.  Ill-defined sclerotic bone mineral density in the left pubic bone, sacrum, with apparent pathologic fracture of left superior and inferior pubic rami.  Findings discussed with and reconfirmed by Dr. Nicanor Alcon on January 31, 2013 at approximately 0410 hours.   Original Report Authenticated By: Awilda Metro    Medications:  I have reviewed the patient's current medications. Scheduled: . cefTRIAXone (ROCEPHIN)  IV  1 g Intravenous Q24H  . enoxaparin (LOVENOX) injection  40 mg Subcutaneous Q24H   Continuous: . sodium chloride 125 mL/hr at 02/01/13 4098   JXB:[id number], acetaminophen, metoprolol, morphine injection, ondansetron (ZOFRAN) IV, ondansetron  Assessment/Plan: Small Bowel Obstruction:  Clinically benign at this point.  If x ray shows improvement, will start on clears.  Endometrial CA:  Per Oncology, will need restaging with PET once she has resolved the above due to new SBO and pelvic findings.  This will be done as an outpatient. HA: Better with pain meds Encouraging OOB and activity to promote getting her intestines moving.  Her exam is quite benign this morning.  Discussed concerns of pain meds and being sedentary at slowing her gut down.  Family at bedside willing to walk with her (is a Engineer, civil (consulting) as well). HTN: Controlled Continue empiric Rocephin as was started in ER.  WIll repeat labs and if still nonseptic appearing, may d/c tomorrow.   LOS: 1 day   Arelie Kuzel W 02/01/2013, 7:54 AM

## 2013-02-01 NOTE — Progress Notes (Signed)
General Surgery [facility]  Patient seen and examined. Headache this morning, now improved.  Encouraged ambulate in halls.  Daughter at bedside.  Tolerating clear liquid diet.  Will follow.  Velora Heckler, MD, [facility] Office: [phone]

## 2013-02-01 NOTE — Progress Notes (Signed)
Pt reports continual headache not relieved by morphine. MD made aware. Gave order for norco 5/325mg . Vwilliams,rn.

## 2013-02-01 NOTE — Progress Notes (Signed)
02/01/2013, 10:28 AM  Hospital day 2 Antibiotics: Rocephin Chemotherapy: none  Patient seen, daughter [name] not in room now.  Subjective: Abdominal symptoms all improved, with no pain or fullness now. Has had some flatus and possibly some BM yesterday (?), slight nausea x 1 when went to radiology this AM. Complaining of vivid dreams and hallucinations after pain meds, some HA and sinus congestion this AM, and face puffy "with IVF". Voiding without difficulty. Has been up in room. Understands that clear liquids have been ordered. She is anxious about possibility of recurrent cancer; we have discussed outpatient PET, which my office will arrange, as well as follow up visit at my office. I have told her that it is encouraging that the symptoms have improved so quickly.  She hopes she can go on planned trip to mountains with family in late Oct.  Objective: Vital signs in last 24 hours: Blood pressure 146/75, pulse 1, temperature 98.5 F (36.9 C), temperature source Oral, resp. rate 16, height 5\' 6"  (1.676 m), weight 159 lb (72.122 kg), SpO2 93.00%.   Intake/Output from previous day: 10/09 0701 - 10/10 0700 In: 0  Out: 1000 [Urine:1000] Intake/Output this shift: Total I/O In: 2310.4 [I.V.:2260.4; IV Piggyback:50] Out: 300 [Urine:300]  Physical exam: awake, alert, oriented and appropriate. Facial swelling not too obvious to me. Oral mucosa moist and clear. Respirations not labored supine on RA. Heart RRR. Abdomen soft, not more distended, quiet, not tender, no appreciable mass. LE no edema, cords, tenderness. IV site ok, infusing at 75 cc/hr. Repositions herself easily in bed.  Lab Results:  Recent Labs  01/31/13 1457 02/01/13 0820  WBC 7.6 5.9  HGB 12.2 13.1  HCT 36.5 39.6  PLT 247 232   BMET  Recent Labs  01/31/13 0225 01/31/13 1457 02/01/13 0820  NA 139  --  138  K 4.4  --  3.6  CL 102  --  105  CO2 29  --  24  GLUCOSE 185*  --  123*  BUN 18  --  10  CREATININE 1.00  0.89 0.92  CALCIUM 10.0  --  8.5    Studies/Results: Ct Abdomen Pelvis Wo Contrast  01/31/2013   *RADIOLOGY REPORT*  Clinical Data: Left flank pain.  History of hysterectomy and endometrial carcinoma.  CT ABDOMEN AND PELVIS WITHOUT CONTRAST  Technique:  Multidetector CT imaging of the abdomen and pelvis was performed following the standard protocol without intravenous contrast.  Comparison: CT of the abdomen and pelvis August 05, 2010  Findings: The limited view of the lung bases are clear. 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  The kidneys orthotopic demonstrate normal size, morphology. Extrarenal pelvis on the right.  No nephrolithiasis or hydronephrosis.  Limited assessment for renal mass on this noncontrast examination.  The unopacified ureters are normal in course and caliber.  Urinary bladder is partially distended, non suspicious.  Small amount of predominate right upper quadrant ascites (11 HU). There are multiple loops of mildly distended small bowel in the central abdomen, measuring up to 2.5 cm.  Tiny calcification versus surgical clip along the posterior wall of one of the loops of bowel in the pelvis.  Large bowel is unremarkable, normal appendix.  The liver, spleen, pancreas, gallbladder and adrenal glands are not suspicious for this noncontrast examination.  Moderate calcific atherosclerosis of the aorta, the vessels are overall normal in course and caliber.  The left anterior abdominal wall subcutaneous nodule now is seen as scarring without discrete mass.  Fracture  of the left pubic body extending to the inferior pubic ramus, in addition to out a left superior pubic ramus fracture, the underlying bone appears somewhat sclerotic and heterogeneous. Patchy areas of sclerosis within the sacrum appears new  IMPRESSION: Mildly prominent fluid filled small bowel (2.5 cm) in the pelvis without discrete transition point on this nonenhanced examination, this could reflect early small bowel  obstruction, possibly from adhesions.  No bowel perforation.  New small amount of ascites in the right upper quadrant predominately.  Ill-defined sclerotic bone mineral density in the left pubic bone, sacrum, with apparent pathologic fracture of left superior and inferior pubic rami.  Findings discussed with and reconfirmed by Dr. Nicanor Alcon on January 31, 2013 at approximately 0410 hours.   Original Report Authenticated By: Awilda Metro   Dg Abd 2 Views  02/01/2013   *RADIOLOGY REPORT*  Clinical Data: Small bowel obstruction, abdominal pain, nausea, subsequent encounter.  ABDOMEN - 2 VIEW  Comparison: CT abdomen pelvis - 01/31/2013  Findings:  Paucity of bowel gas without evidence of obstruction.  No pneumoperitoneum, pneumatosis or portal venous gas. No definite abnormal intra-abdominal calcifications.  Limited visualization of the lower thorax demonstrates trace bilateral pleural effusions with associated bibasilar opacities, left greater than right.  Mild scoliotic curvature of the thoracolumbar spine with associated multilevel DDD.  IMPRESSION: Paucity of bowel gas without evidence of obstruction.   Original Report Authenticated By: Tacey Ruiz, MD     Assessment/Plan: 1.Probable early SBO in patient with history of surgery and RT for endometrial cancer:  Improved clinically and abd xray this AM has no evidence of obstruction. To begin clear liquids. Hopefully can DC home soon if continues to do well. My office will be in touch with her to set up PET in ~ 3 weeks and follow up with me after that scan. ( will try to avoid dates of her trip if possible). 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 Not clear if there is further recurrent disease now.  3.pubic fractures and bony changes in pelvis and sacrum new  from scans of 2012. ? Related to RT or other; not symptomatic now. I PET as above and Dr Roselind Messier to be informed. 4.remote tobacco  5.HTN  6.has had flu vaccine  Please call over weekend if our service can be of assistance.  LIVESAY,LENNIS P  [phone]

## 2013-02-01 NOTE — Progress Notes (Signed)
Subjective: She has a headache, feels a bit flushed and swollen up some.  Some flatus and some nausea after going down for film.  Objective: Vital signs in last 24 hours: Temp:  [98.2 F (36.8 C)-98.6 F (37 C)] 98.5 F (36.9 C) (10/10 0601) Pulse Rate:  [1-90] 1 (10/10 0601) Resp:  [16-20] 16 (10/10 0601) BP: (121-146)/(54-75) 146/75 mmHg (10/10 0601) SpO2:  [93 %-100 %] 93 % (10/10 0601) Last BM Date: 01/30/13 Afebrile, VSS CBC OK Intake/Output from previous day: 10/09 0701 - 10/10 0700 In: 0  Out: 1000 [Urine:1000] Intake/Output this shift: Total I/O In: 2310.4 [I.V.:2260.4; IV Piggyback:50] Out: -   General appearance: alert, cooperative, no distress and anxious over illness. GI: soft, non-tender; bowel sounds normal; no masses,  no organomegaly  Lab Results:   Recent Labs  01/31/13 0225 01/31/13 1457  WBC 8.2 7.6  HGB 13.7 12.2  HCT 41.3 36.5  PLT 238 247    BMET  Recent Labs  01/31/13 0225 01/31/13 1457  NA 139  --   K 4.4  --   CL 102  --   CO2 29  --   GLUCOSE 185*  --   BUN 18  --   CREATININE 1.00 0.89  CALCIUM 10.0  --    PT/INR No results found for this basename: LABPROT, INR,  in the last 72 hours   Recent Labs Lab 01/31/13 0220  AST 26  ALT 24  ALKPHOS 75  BILITOT 0.4  PROT 6.8  ALBUMIN 3.9     Lipase     Component Value Date/Time   LIPASE 24 01/31/2013 0220     Studies/Results: Ct Abdomen Pelvis Wo Contrast  01/31/2013   *RADIOLOGY REPORT*  Clinical Data: Left flank pain.  History of hysterectomy and endometrial carcinoma.  CT ABDOMEN AND PELVIS WITHOUT CONTRAST  Technique:  Multidetector CT imaging of the abdomen and pelvis was performed following the standard protocol without intravenous contrast.  Comparison: CT of the abdomen and pelvis August 05, 2010  Findings: The limited view of the lung bases are clear. 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  The kidneys orthotopic demonstrate normal size, morphology.  Extrarenal pelvis on the right.  No nephrolithiasis or hydronephrosis.  Limited assessment for renal mass on this noncontrast examination.  The unopacified ureters are normal in course and caliber.  Urinary bladder is partially distended, non suspicious.  Small amount of predominate right upper quadrant ascites (11 HU). There are multiple loops of mildly distended small bowel in the central abdomen, measuring up to 2.5 cm.  Tiny calcification versus surgical clip along the posterior wall of one of the loops of bowel in the pelvis.  Large bowel is unremarkable, normal appendix.  The liver, spleen, pancreas, gallbladder and adrenal glands are not suspicious for this noncontrast examination.  Moderate calcific atherosclerosis of the aorta, the vessels are overall normal in course and caliber.  The left anterior abdominal wall subcutaneous nodule now is seen as scarring without discrete mass.  Fracture of the left pubic body extending to the inferior pubic ramus, in addition to out a left superior pubic ramus fracture, the underlying bone appears somewhat sclerotic and heterogeneous. Patchy areas of sclerosis within the sacrum appears new  IMPRESSION: Mildly prominent fluid filled small bowel (2.5 cm) in the pelvis without discrete transition point on this nonenhanced examination, this could reflect early small bowel obstruction, possibly from adhesions.  No bowel perforation.  New small amount of ascites in the right  upper quadrant predominately.  Ill-defined sclerotic bone mineral density in the left pubic bone, sacrum, with apparent pathologic fracture of left superior and inferior pubic rami.  Findings discussed with and reconfirmed by Dr. Nicanor Alcon on January 31, 2013 at approximately 0410 hours.   Original Report Authenticated By: Awilda Metro   Dg Abd 2 Views  02/01/2013   *RADIOLOGY REPORT*  Clinical Data: Small bowel obstruction, abdominal pain, nausea, subsequent encounter.  ABDOMEN - 2 VIEW  Comparison:  CT abdomen pelvis - 01/31/2013  Findings:  Paucity of bowel gas without evidence of obstruction.  No pneumoperitoneum, pneumatosis or portal venous gas. No definite abnormal intra-abdominal calcifications.  Limited visualization of the lower thorax demonstrates trace bilateral pleural effusions with associated bibasilar opacities, left greater than right.  Mild scoliotic curvature of the thoracolumbar spine with associated multilevel DDD.  IMPRESSION: Paucity of bowel gas without evidence of obstruction.   Original Report Authenticated By: Tacey Ruiz, MD    Medications: . cefTRIAXone (ROCEPHIN)  IV  1 g Intravenous Q24H  . enoxaparin (LOVENOX) injection  40 mg Subcutaneous Q24H    Assessment/Plan 1. Nausea, vomiting, abdominal pain. SBO vs enteritis  2. Hx of recurrent endometrial cancer with chemotherapy and radiation therapy, 07/2010  3. Hx of Robotic assisted laparoscopic vaginal hysterectomy for fibroids/BSSO, 10/2008  4. Hypertension  5. Hypercholesterolemia  6. Hx of bursitis left shoulder  7. Hx of some back pain   Plan:   Her film is better, "Paucity of bowel gas without evidence of obstruction."  Start her on some clears and see how she does.   LOS: 1 day    [name] 02/01/2013

## 2013-02-02 ENCOUNTER — Inpatient Hospital Stay (HOSPITAL_COMMUNITY): Payer: Medicare Other

## 2013-02-02 LAB — CBC
HCT: 41.2 % (ref 36.0–46.0)
MCH: 30.5 pg (ref 26.0–34.0)
MCV: 93.6 fL (ref 78.0–100.0)
RDW: 13.2 % (ref 11.5–15.5)
WBC: 4.5 10*3/uL (ref 4.0–10.5)

## 2013-02-02 LAB — BASIC METABOLIC PANEL
BUN: 8 mg/dL (ref 6–23)
Chloride: 104 mEq/L (ref 96–112)
Creatinine, Ser: 0.77 mg/dL (ref 0.50–1.10)
GFR calc Af Amer: >90 mL/min (ref >90)
Potassium: 3.6 mEq/L (ref 3.5–5.1)

## 2013-02-02 LAB — GLUCOSE, CAPILLARY
Glucose-Capillary: 100 mg/dL — ABNORMAL HIGH (ref 70–99)
Glucose-Capillary: 93 mg/dL (ref 70–99)

## 2013-02-02 NOTE — Progress Notes (Signed)
  Subjective: Tolerating full liquids.  No nausea.  +flatus but no BM  Objective: Vital signs in last 24 hours: Temp:  [98.2 F (36.8 C)-98.3 F (36.8 C)] 98.3 F (36.8 C) (10/11 0618) Pulse Rate:  [86-92] 89 (10/11 0618) Resp:  [18] 18 (10/11 0618) BP: (146-170)/(73-81) 170/73 mmHg 02/21/23 0618) SpO2:  [95 %-99 %] 95 % 2023-02-21 0618) Last BM Date: 01/30/13  Intake/Output from previous day: 10/10 0701 - 10/11 0700 In: 3321.7 [P.O.:360; I.V.:2911.7; IV Piggyback:50] Out: 1950 [Urine:1950] Intake/Output this shift:    General appearance: alert, cooperative and no distress GI: soft, NT on exam, ND, no peritoneal signs  Lab Results:   Recent Labs  02/01/13 0820 February 20, 2013 0624  WBC 5.9 4.5  HGB 13.1 13.4  HCT 39.6 41.2  PLT 232 226   BMET  Recent Labs  02/01/13 0820 2013-02-20 0624  NA 138 137  K 3.6 3.6  CL 105 104  CO2 24 24  GLUCOSE 123* 100*  BUN 10 8  CREATININE 0.92 0.77  CALCIUM 8.5 9.0   PT/INR No results found for this basename: LABPROT, INR,  in the last 72 hours ABG No results found for this basename: PHART, PCO2, PO2, HCO3,  in the last 72 hours  Studies/Results: Dg Abd 2 Views  20-Feb-2013   CLINICAL DATA:  Followup small bowel obstruction, patient may subjectively feels better today  EXAM: ABDOMEN - 2 VIEW  COMPARISON:  In 01/2013  FINDINGS: There is gas throughout small and large bowel into the rectum. There remains a paucity of bowel gas. No abnormally dilated loops of bowel are appreciated.  IMPRESSION: Similar appearance to prior study. Gas is seen into the rectum.   Electronically Signed   By: Esperanza Heir M.D.   On: 02/20/13 07:34   Dg Abd 2 Views  02/01/2013   *RADIOLOGY REPORT*  Clinical Data: Small bowel obstruction, abdominal pain, nausea, subsequent encounter.  ABDOMEN - 2 VIEW  Comparison: CT abdomen pelvis - 01/31/2013  Findings:  Paucity of bowel gas without evidence of obstruction.  No pneumoperitoneum, pneumatosis or portal  venous gas. No definite abnormal intra-abdominal calcifications.  Limited visualization of the lower thorax demonstrates trace bilateral pleural effusions with associated bibasilar opacities, left greater than right.  Mild scoliotic curvature of the thoracolumbar spine with associated multilevel DDD.  IMPRESSION: Paucity of bowel gas without evidence of obstruction.   Original Report Authenticated By: Tacey Ruiz, MD    Anti-infectives: Anti-infectives   Start     Dose/Rate Route Frequency Ordered Stop   01/31/13 1500  cefTRIAXone (ROCEPHIN) 1 g in dextrose 5 % 50 mL IVPB  Status:  Discontinued     1 g 100 mL/hr over 30 Minutes Intravenous Every 24 hours 01/31/13 1347 02-20-2013 1013   01/31/13 0449  cefTRIAXone (ROCEPHIN) 1 G injection    Comments:  Carlean Purl   : cabinet override      01/31/13 0449 01/31/13 0456   01/31/13 0445  cefTRIAXone (ROCEPHIN) 1 g in dextrose 5 % 50 mL IVPB     1 g 100 mL/hr over 30 Minutes Intravenous  Once 01/31/13 0437 01/31/13 0525      Assessment/Plan: s/p * No surgery found * she looks well and feels well today.  on full liquids without problem.  xrays look good.  should be okay to advance diet as tolerated  LOS: 2 days    Lodema Pilot DAVID 2013-02-20

## 2013-02-02 NOTE — Progress Notes (Signed)
Subjective: Walking in hall. Doing okay. Some vomiting last night but felt it was from migraine. No pain now.  Objective: Vital signs in last 24 hours: Temp:  [98.2 F (36.8 C)-98.3 F (36.8 C)] 98.3 F (36.8 C) (10/11 0618) Pulse Rate:  [86-92] 89 (10/11 0618) Resp:  [18] 18 (10/11 0618) BP: (146-170)/(73-81) 170/73 mmHg 02/24/23 0618) SpO2:  [95 %-99 %] 95 % 24-Feb-2023 0618) Weight change:  Last BM Date: 01/30/13  Intake/Output from previous day: 10/10 0701 - 10/11 0700 In: 3321.7 [P.O.:360; I.V.:2911.7; IV Piggyback:50] Out: 1950 [Urine:1950] Intake/Output this shift:    General appearance: alert, cooperative and appears stated age Resp: clear to auscultation bilaterally Cardio: regular rate and rhythm, S1, S2 normal, no murmur, click, rub or gallop GI: soft, non-tender; bowel sounds normal; no masses,  no organomegaly Extremities: extremities normal, atraumatic, no cyanosis or edema Neurologic: Grossly normal   Lab Results:  Recent Labs  02/01/13 0820 02-23-2013 0624  WBC 5.9 4.5  HGB 13.1 13.4  HCT 39.6 41.2  PLT 232 226   BMET  Recent Labs  02/01/13 0820 02-23-13 0624  NA 138 137  K 3.6 3.6  CL 105 104  CO2 24 24  GLUCOSE 123* 100*  BUN 10 8  CREATININE 0.92 0.77  CALCIUM 8.5 9.0   CMET CMP     Component Value Date/Time   NA 137 February 23, 2013 0624   K 3.6 2013-02-23 0624   CL 104 02-23-13 0624   CO2 24 Feb 23, 2013 0624   GLUCOSE 100* Feb 23, 2013 0624   BUN 8 02/23/2013 0624   CREATININE 0.77 23-Feb-2013 0624   CALCIUM 9.0 02/23/13 0624   PROT 6.0 02/01/2013 0820   ALBUMIN 3.1* 02/01/2013 0820   AST 29 02/01/2013 0820   ALT 19 02/01/2013 0820   ALKPHOS 65 02/01/2013 0820   BILITOT 0.3 02/01/2013 0820   GFRNONAA 81* 2013-02-23 0624   GFRAA >90 02/23/2013 1610     Studies/Results: Dg Abd 2 Views  February 23, 2013   CLINICAL DATA:  Followup small bowel obstruction, patient may subjectively feels better today  EXAM: ABDOMEN - 2 VIEW  COMPARISON:  In  01/2013  FINDINGS: There is gas throughout small and large bowel into the rectum. There remains a paucity of bowel gas. No abnormally dilated loops of bowel are appreciated.  IMPRESSION: Similar appearance to prior study. Gas is seen into the rectum.   Electronically Signed   By: Esperanza Heir M.D.   On: 2013-02-23 07:34   Dg Abd 2 Views  02/01/2013   *RADIOLOGY REPORT*  Clinical Data: Small bowel obstruction, abdominal pain, nausea, subsequent encounter.  ABDOMEN - 2 VIEW  Comparison: CT abdomen pelvis - 01/31/2013  Findings:  Paucity of bowel gas without evidence of obstruction.  No pneumoperitoneum, pneumatosis or portal venous gas. No definite abnormal intra-abdominal calcifications.  Limited visualization of the lower thorax demonstrates trace bilateral pleural effusions with associated bibasilar opacities, left greater than right.  Mild scoliotic curvature of the thoracolumbar spine with associated multilevel DDD.  IMPRESSION: Paucity of bowel gas without evidence of obstruction.   Original Report Authenticated By: Tacey Ruiz, MD    Medications: I have reviewed the patient's current medications.  . cefTRIAXone (ROCEPHIN)  IV  1 g Intravenous Q24H  . enoxaparin (LOVENOX) injection  40 mg Subcutaneous Q24H     Assessment/Plan: Endometrial Cancer: per onco SBO- unclear cause. ? From scarring or xrt effects. Hopefully not from tumor recurrence.  To have PET in a few weeks.  Advance  diet. D/c home tomorrow if does okay.      LOS: 2 days   Ezequiel Kayser, MD 02/02/2013, 10:05 AM

## 2013-02-03 LAB — CBC
Hemoglobin: 11 g/dL — ABNORMAL LOW (ref 12.0–15.0)
MCH: 30.8 pg (ref 26.0–34.0)
RBC: 3.57 MIL/uL — ABNORMAL LOW (ref 3.87–5.11)
WBC: 3.4 10*3/uL — ABNORMAL LOW (ref 4.0–10.5)

## 2013-02-03 LAB — BASIC METABOLIC PANEL
CO2: 26 mEq/L (ref 19–32)
Chloride: 105 mEq/L (ref 96–112)
Glucose, Bld: 101 mg/dL — ABNORMAL HIGH (ref 70–99)
Potassium: 3.3 mEq/L — ABNORMAL LOW (ref 3.5–5.1)
Sodium: 139 mEq/L (ref 135–145)

## 2013-02-03 MED ORDER — CALCIUM CARBONATE 600 MG PO TABS: 600.0000 mg | ORAL_TABLET | Freq: Two times a day (BID) | ORAL | Status: DC

## 2013-02-03 MED ORDER — POTASSIUM CHLORIDE CRYS ER 20 MEQ PO TBCR
20.0000 meq | EXTENDED_RELEASE_TABLET | Freq: Once | ORAL | Status: AC
  Administered 2013-02-03: 20 meq via ORAL
  Filled 2013-02-03: qty 1

## 2013-02-03 NOTE — Progress Notes (Signed)
Pt was discharged to home, via her daughter. Pt. Teaching done by Luellen Pucker. Broadus John Mudlogger). Pt. Signed discharged notes. Pt. Discharge notes were accidentally sent home with patient.

## 2013-02-03 NOTE — Discharge Summary (Signed)
Physician Discharge Summary  Patient ID: [name] MRN: [id number] DOB/AGE: [birth year] 72 y.o.  Admit date: 01/31/2013 Discharge date: 02/03/2013  Admission Diagnoses:  Discharge Diagnoses:  Endometrial cancer s/p surgery and chemotherapy and radiation therapy Partial SBO-resolved at time of discharge Nausea and vomiting-resolved   Discharged Condition: good  Hospital Course: [name] is a very pleasant 72 yo female with a pmhx sig. For enodmetrial cancer s/p therapy in the last few years. She has not had recurrent disease known at this time.  However, she presented with nausea, vomiting and symptoms consistent with SBO. She was admitted and placed on ivf and bowel rest. She never needed an NG tube.  Over the next few days she improved. Oncology saw her and an outpatient PET scan will be done in the next few weeks.   By 02/03/13 she was eating well and not having bowel obstruction symptoms.  She was deemed stable for discharge home.  Consults: oncology  Significant Diagnostic Studies: see below  Treatments: ivf  Discharge Exam:  Blood pressure 182/78, pulse 97, temperature 98.5 F (36.9 C), temperature source Oral, resp. rate 18, height 5\' 6"  (1.676 m), weight 72.122 kg (159 lb), SpO2 93.00%.  Physical Exam:nad,  Alert and oriented times 3,  cta bilat. No w/r/r rrr no m/r/g abd soft nt, nd. No mass . No hsm. No edema. Neurologically intact.  CBC    Component Value Date/Time   WBC 3.4* 02/03/2013 0525   WBC 6.0 06/09/2010 0948   RBC 3.57* 02/03/2013 0525   RBC 3.63* 06/09/2010 0948   HGB 11.0* 02/03/2013 0525   HGB 11.8 06/09/2010 0948   HCT 32.9* 02/03/2013 0525   HCT 34.8 06/09/2010 0948   PLT 215 02/03/2013 0525   PLT 223 06/09/2010 0948   MCV 92.2 02/03/2013 0525   MCV 96.1 06/09/2010 0948   MCH 30.8 02/03/2013 0525   MCH 32.6 06/09/2010 0948   MCHC 33.4 02/03/2013 0525   MCHC 33.9 06/09/2010 0948   RDW 13.3 02/03/2013 0525   RDW 17.7* 06/09/2010 0948   LYMPHSABS  1.2 01/31/2013 0225   LYMPHSABS 1.5 06/09/2010 0948   MONOABS 0.3 01/31/2013 0225   MONOABS 0.5 06/09/2010 0948   EOSABS 0.1 01/31/2013 0225   EOSABS 0.0 06/09/2010 0948   BASOSABS 0.0 01/31/2013 0225   BASOSABS 0.0 06/09/2010 0948    BMET    Component Value Date/Time   NA 139 02/03/2013 0525   K 3.3* 02/03/2013 0525   CL 105 02/03/2013 0525   CO2 26 02/03/2013 0525   GLUCOSE 101* 02/03/2013 0525   BUN 9 02/03/2013 0525   CREATININE 0.83 02/03/2013 0525   CALCIUM 9.0 02/03/2013 0525   GFRNONAA 68* 02/03/2013 0525   GFRAA 79* 02/03/2013 0525      Disposition:discharge to home   Future Appointments Provider Department Dept Phone   04/29/2013 1:00 PM Billie Lade, MD [facility] [phone]       Medication List    ASK your doctor about these medications       aspirin 325 MG tablet  Take 325 mg by mouth daily.     benazepril 20 MG tablet  Commonly known as:  LOTENSIN  Take 20 mg by mouth daily.     calcium carbonate 600 MG Tabs tablet  Commonly known as:  OS-CAL  Take 600 mg by mouth once daily with a meal. With vitamin d     cholecalciferol 1000 UNITS tablet  Commonly known as:  VITAMIN  D  Take 1,000 Units by mouth daily.     diclofenac sodium 1 % Gel  Commonly known as:  VOLTAREN  Apply topically 2 (two) times daily as needed.     docusate sodium 100 MG capsule  Commonly known as:  COLACE  Take 100 mg by mouth 2 (two) times daily as needed.     fexofenadine 60 MG tablet  Commonly known as:  ALLEGRA  Take 60 mg by mouth daily.     fish oil-omega-3 fatty acids 1000 MG capsule  Take 1,200 mg by mouth daily.     Folic Acid-Vit B6-Vit B12 2.5-25-1 MG Tabs tablet  Commonly known as:  FOLBEE  Take 1 tablet by mouth daily.     ibuprofen 200 MG tablet  Commonly known as:  ADVIL,MOTRIN  Take 200 mg by mouth every 6 (six) hours as needed.     meclizine 25 MG tablet  Commonly known as:  ANTIVERT  Take 25 mg by mouth 3 (three)  times daily as needed.     multivitamin tablet  Take 1 tablet by mouth daily. Centrum silver     raloxifene 60 MG tablet  Commonly known as:  EVISTA  Take 60 mg by mouth daily.     simvastatin 40 MG tablet  Commonly known as:  ZOCOR  Take 40 mg by mouth every evening.         SignedRodrigo Ran A 02/03/2013, 9:58 AM

## 2013-02-03 NOTE — Progress Notes (Signed)
  Subjective: Feels well.  Tolerating diet.  +BM  Objective: Vital signs in last 24 hours: Temp:  [97.9 F (36.6 C)-98.8 F (37.1 C)] 98.5 F (36.9 C) (10/12 0609) Pulse Rate:  [66-97] 97 (10/12 0609) Resp:  [16-18] 18 (10/12 0609) BP: (137-182)/(60-81) 182/78 mmHg (10/12 0609) SpO2:  [93 %-100 %] 93 % (10/12 0609) Last BM Date: 01/30/13  Intake/Output from previous day: 10/11 0701 - 10/12 0700 In: 520 [P.O.:520] Out: 1800 [Urine:1800] Intake/Output this shift:    General appearance: alert, cooperative and no distress GI: soft, non-tender; bowel sounds normal; no masses,  no organomegaly  Lab Results:   Recent Labs  21-Feb-2013 0624 02/03/13 0525  WBC 4.5 3.4*  HGB 13.4 11.0*  HCT 41.2 32.9*  PLT 226 215   BMET  Recent Labs  2013-02-21 0624 02/03/13 0525  NA 137 139  K 3.6 3.3*  CL 104 105  CO2 24 26  GLUCOSE 100* 101*  BUN 8 9  CREATININE 0.77 0.83  CALCIUM 9.0 9.0   PT/INR No results found for this basename: LABPROT, INR,  in the last 72 hours ABG No results found for this basename: PHART, PCO2, PO2, HCO3,  in the last 72 hours  Studies/Results: Dg Abd 2 Views  02/21/13   CLINICAL DATA:  Followup small bowel obstruction, patient may subjectively feels better today  EXAM: ABDOMEN - 2 VIEW  COMPARISON:  In 01/2013  FINDINGS: There is gas throughout small and large bowel into the rectum. There remains a paucity of bowel gas. No abnormally dilated loops of bowel are appreciated.  IMPRESSION: Similar appearance to prior study. Gas is seen into the rectum.   Electronically Signed   By: Esperanza Heir M.D.   On: 21-Feb-2013 07:34    Anti-infectives: Anti-infectives   Start     Dose/Rate Route Frequency Ordered Stop   01/31/13 1500  cefTRIAXone (ROCEPHIN) 1 g in dextrose 5 % 50 mL IVPB  Status:  Discontinued     1 g 100 mL/hr over 30 Minutes Intravenous Every 24 hours 01/31/13 1347 2013-02-21 1013   01/31/13 0449  cefTRIAXone (ROCEPHIN) 1 G injection     Comments:  Haskins, Kaila   : cabinet override      01/31/13 0449 01/31/13 0456   01/31/13 0445  cefTRIAXone (ROCEPHIN) 1 g in dextrose 5 % 50 mL IVPB     1 g 100 mL/hr over 30 Minutes Intravenous  Once 01/31/13 0437 01/31/13 0525      Assessment/Plan: s/p * No surgery found * she looks and feels well.  no evidence of ongoing obstruction.  diet and activity as tolerated  LOS: 3 days    [name] 02/03/2013

## 2013-02-05 ENCOUNTER — Telehealth: Payer: Self-pay | Admitting: Oncology

## 2013-02-18 ENCOUNTER — Encounter (HOSPITAL_COMMUNITY)
Admission: RE | Admit: 2013-02-18 | Discharge: 2013-02-18 | Disposition: A | Discharge status: Home / Self Care | Payer: Medicare Other | Source: Ambulatory Visit | Attending: Oncology | Admitting: Oncology

## 2013-02-18 DIAGNOSIS — C541 Malignant neoplasm of endometrium: Secondary | ICD-10-CM

## 2013-02-18 DIAGNOSIS — C549 Malignant neoplasm of corpus uteri, unspecified: Secondary | ICD-10-CM | POA: Insufficient documentation

## 2013-02-18 MED ORDER — FLUDEOXYGLUCOSE F - 18 (FDG) INJECTION
18.2000 | Freq: Once | INTRAVENOUS | Status: AC | PRN
  Administered 2013-02-18: 18.2 via INTRAVENOUS

## 2013-02-19 ENCOUNTER — Other Ambulatory Visit (HOSPITAL_COMMUNITY): Payer: Medicare Other

## 2013-02-19 DIAGNOSIS — E785 Hyperlipidemia, unspecified: Secondary | ICD-10-CM | POA: Diagnosis not present

## 2013-02-19 DIAGNOSIS — R7301 Impaired fasting glucose: Secondary | ICD-10-CM | POA: Diagnosis not present

## 2013-02-19 DIAGNOSIS — I10 Essential (primary) hypertension: Secondary | ICD-10-CM | POA: Diagnosis not present

## 2013-02-20 ENCOUNTER — Other Ambulatory Visit: Payer: Medicare Other | Admitting: Lab

## 2013-02-20 ENCOUNTER — Ambulatory Visit (HOSPITAL_BASED_OUTPATIENT_CLINIC_OR_DEPARTMENT_OTHER): Payer: Medicare Other | Admitting: Oncology

## 2013-02-20 ENCOUNTER — Encounter: Payer: Self-pay | Admitting: Oncology

## 2013-02-20 VITALS — BP 154/74 | HR 78 | Temp 98.6°F | Resp 18 | Ht 66.0 in | Wt 158.1 lb

## 2013-02-20 DIAGNOSIS — C549 Malignant neoplasm of corpus uteri, unspecified: Secondary | ICD-10-CM

## 2013-02-20 DIAGNOSIS — Z8542 Personal history of malignant neoplasm of other parts of uterus: Secondary | ICD-10-CM

## 2013-02-20 NOTE — Progress Notes (Signed)
OFFICE PROGRESS NOTE   02/20/2013   Physicians: W.Brewster, J.Kinard, R.Tisovec  INTERVAL HISTORY:  Patient is seen, together with daughter, in follow up of recent hospitalization for bowel obstruction which resolved with conservative intervention, and for discussion of PET scan done 02-18-13 due to questions from CT done for bowel obstruction. She has been feeling entirely well since discharge from hospital 02-03-13, including trip [place] with family as planned.  Fortunately the PET does not have any hypermetabolic uptake suggesting recurrent gyn cancer, including nothing in pelvis where left superior pubic ramus fracture and sclerotic bony changes were seen on CT. I had discussed CT with Dr Roselind Messier, with information that prior RT covered left medial inguinal node such that dose to pubic bone was ~ 45 Gy,  higher than a typical treatment field otherwise. I have discussed all of this information with patient and RN daughter now, and given them copy of the PET report.   ONCOLOGIC HISTORY Patient was initially diagnosed with IIIA grade 1 endometrial adenocarcinoma in July 2010, when she presented to Dr Floyde Parkins with vaginal bleeding. She had robotic hysterectomy with BSO and bilateral pelvic lymph node evaluation (8 nodes negative on right and 17 negative on left). She was treated on GOG protocol 258 with 6 cycles of taxol and carboplatin thru January 2011 (no RT on that arm of the protocol). In ~ Nov 2011 she was found to have vaginal recurrence as well as obturator mass LUQ at port site used for the robotic surgery, and enlarged obturator nodes. She received additional taxol carboplatin x 4 cycles thru Feb 2012, then external beam radiation to pelvis to 4500 cGy from 07-29-10 thru 09-06-10, with intracavitary brachytherapy x 3 completed 09-28-10. Last CT AP before study today was 07-12-10. She saw Dr Roselind Messier July 2014 and Dr Nelly Rout early Oct 2014.   Review of systems as above, also: No  abdominal or pelvic pain. Bowels moving normally. Bladder ok. No SOB, no new or different pain otherwise. No fever or symptoms of infection. Energy at baseline. No LE swelling. Remainder of 10 point Review of Systems negative.  Objective:  Vital signs in last 24 hours:  BP 154/74  Pulse 78  Temp(Src) 98.6 F (37 C) (Oral)  Resp 18  Ht 5\' 6"  (1.676 m)  Wt 158 lb 1.6 oz (71.714 kg)  BMI 25.53 kg/m2  SpO2 100%  Alert, oriented and appropriate, just delightful as always. Ambulatory without difficulty.    HEENT:PERRL, sclerae not icteric. Oral mucosa moist without lesions, posterior pharynx clear.  Neck supple. No JVD.  Lymphatics:no cervical,suraclavicular, axillary or inguinal adenopathy Resp: clear to auscultation bilaterally and normal percussion bilaterally Cardio: regular rate and rhythm. No gallop. GI: soft, nontender, not distended, no mass or organomegaly. Normally active bowel sounds. Surgical incision not remarkable. Musculoskeletal/ Extremities: without pitting edema, cords, tenderness. Back not tender Neuro: no peripheral neuropathy. Otherwise nonfocal. Psych as above Skin without rash, ecchymosis, petechiae   Lab Results:  Results for orders placed during the hospital encounter of 02/18/13  GLUCOSE, CAPILLARY      Result Value Range   Glucose-Capillary 110 (*) 70 - 99 mg/dL   Other recent labs reviewed including CMET from 02-01-13 and normal CBC 02-02-13  Studies/Results: CT ABDOMEN AND PELVIS WITHOUT CONTRAST 01-31-2013  Comparison: CT of the abdomen and pelvis August 05, 2010  Findings: The limited view of the lung bases are clear. Mild heart  deformity due to pectus extra bottom.  The kidneys orthotopic demonstrate normal size, morphology.  Extrarenal pelvis on the right. No nephrolithiasis or  hydronephrosis. Limited assessment for renal mass on this  noncontrast examination. The unopacified ureters are normal in  course and caliber. Urinary bladder is  partially distended, non  suspicious.  Small amount of predominate right upper quadrant ascites (11 HU).  There are multiple loops of mildly distended small bowel in the  central abdomen, measuring up to 2.5 cm. Tiny calcification versus  surgical clip along the posterior wall of one of the loops of bowel  in the pelvis. Large bowel is unremarkable, normal appendix.  The liver, spleen, pancreas, gallbladder and adrenal glands are not  suspicious for this noncontrast examination. Moderate calcific  atherosclerosis of the aorta, the vessels are overall normal in  course and caliber. The left anterior abdominal wall subcutaneous  nodule now is seen as scarring without discrete mass.  Fracture of the left pubic body extending to the inferior pubic  ramus, in addition to out a left superior pubic ramus fracture, the  underlying bone appears somewhat sclerotic and heterogeneous.  Patchy areas of sclerosis within the sacrum appears new  IMPRESSION: Mildly prominent fluid filled small bowel (2.5 cm) in  the pelvis without discrete transition point on this nonenhanced  examination, this could reflect early small bowel obstruction,  possibly from adhesions. No bowel perforation.  New small amount of ascites in the right upper quadrant  predominately.  Ill-defined sclerotic bone mineral density in the left pubic bone,  sacrum, with apparent pathologic fracture of left superior and  inferior pubic rami.   NUCLEAR MEDICINE PET SKULL BASE TO THIGH 02-18-13   COMPARISON: 03/01/2010  FINDINGS:  NECK  No hypermetabolic lymph nodes in the neck.  CHEST  No hypermetabolic mediastinal or hilar nodes. No suspicious  pulmonary nodules on the CT scan.  ABDOMEN/PELVIS  No abnormal hypermetabolic activity within the liver, pancreas,  adrenal glands, or spleen. No hypermetabolic lymph nodes in the  abdomen or pelvis. Previously noted hypermetabolic adenopathy in the  left inguinal and obturator regions  is no longer visualized.  SKELETON  No focal hypermetabolic activity to suggest skeletal metastasis.  IMPRESSION:  Negative. No evidence of recurrent or metastatic carcinoma.     Medications: I have reviewed the patient's current medications.  DISCUSSION: Information above reviewed. The self-limited bowel obstruction may well have been related to some area of adhesions, and may have been precipitated by very high fiber meal just prior. We have discussed some low residue diet choices. Patient is to see Dr Wylene Simmer next week for physical exam, which will be good timing for careful follow up of the recent GI problem. She is to see Dr Roselind Messier in Jan and Dr Nelly Rout in April 2015. I will update all of those physicians with PET results and this note. I am glad to see her back at any time if needed, but have not given her another appointment at this office now.  Assessment/Plan: 1. SBO early Oct 2014 in patient with history of surgery and RT for endometrial cancer: resolved with conservative care in hospital then. Asymptomatic now and no evidence of active cancer by PET. Plan as above 2.history of endometrial cancer IIIA grade 1 at diagnosis July 2010, with surgery and adjuvant taxol carboplatin on protocol then, recurrence in vagina and surgical port area Nov 2011 with additional taxol carboplatin then RT as above. No evidence of active disease by PET now 3.pubic fractures and bony changes in pelvis and sacrum new from scans of 2012. Likely related to  RT; not symptomatic now and no uptake on PET . 4.remote tobacco  5.HTN  6. flu vaccine done 7.hx colon polyps: she had colonoscopy sometime prior to 2010, tho I am not certain of physician for this    Patient and daughter followed conversation well and had all questions answered. They understand that I am glad to see her at any time if she or other physicians feel that I can be of help.  Time spent 25 min including > 50% discussion and coordination of  care  LIVESAY,LENNIS P, MD   02/20/2013, 4:33 PM

## 2013-02-20 NOTE — Patient Instructions (Signed)
Call if needed. Low residue diet may be best. Keep appointments with Drs Roselind Messier and Nelly Rout as scheduled.

## 2013-02-26 DIAGNOSIS — Z0000 Encounter for general adult medical examination without abnormal findings: Secondary | ICD-10-CM | POA: Diagnosis not present

## 2013-02-26 DIAGNOSIS — J301 Allergic rhinitis due to pollen: Secondary | ICD-10-CM | POA: Diagnosis not present

## 2013-02-26 DIAGNOSIS — K56609 Unspecified intestinal obstruction, unspecified as to partial versus complete obstruction: Secondary | ICD-10-CM | POA: Diagnosis not present

## 2013-02-26 DIAGNOSIS — Z1331 Encounter for screening for depression: Secondary | ICD-10-CM | POA: Diagnosis not present

## 2013-02-26 DIAGNOSIS — H8110 Benign paroxysmal vertigo, unspecified ear: Secondary | ICD-10-CM | POA: Diagnosis not present

## 2013-02-26 DIAGNOSIS — R7301 Impaired fasting glucose: Secondary | ICD-10-CM | POA: Diagnosis not present

## 2013-02-26 DIAGNOSIS — E785 Hyperlipidemia, unspecified: Secondary | ICD-10-CM | POA: Diagnosis not present

## 2013-02-26 DIAGNOSIS — M810 Age-related osteoporosis without current pathological fracture: Secondary | ICD-10-CM | POA: Diagnosis not present

## 2013-02-26 DIAGNOSIS — Z23 Encounter for immunization: Secondary | ICD-10-CM | POA: Diagnosis not present

## 2013-02-26 DIAGNOSIS — I10 Essential (primary) hypertension: Secondary | ICD-10-CM | POA: Diagnosis not present

## 2013-02-27 DIAGNOSIS — Z1212 Encounter for screening for malignant neoplasm of rectum: Secondary | ICD-10-CM | POA: Diagnosis not present

## 2013-04-29 ENCOUNTER — Encounter: Payer: Self-pay | Admitting: Radiation Oncology

## 2013-04-29 ENCOUNTER — Ambulatory Visit
Admission: RE | Admit: 2013-04-29 | Discharge: 2013-04-29 | Disposition: A | Discharge status: Home / Self Care | Payer: Medicare Other | Source: Ambulatory Visit | Attending: Radiation Oncology | Admitting: Radiation Oncology

## 2013-04-29 VITALS — BP 168/75 | HR 102 | Temp 97.8°F | Ht 66.0 in | Wt 162.9 lb

## 2013-04-29 DIAGNOSIS — Z803 Family history of malignant neoplasm of breast: Secondary | ICD-10-CM | POA: Diagnosis not present

## 2013-04-29 DIAGNOSIS — Z9221 Personal history of antineoplastic chemotherapy: Secondary | ICD-10-CM | POA: Diagnosis not present

## 2013-04-29 DIAGNOSIS — Z6826 Body mass index (BMI) 26.0-26.9, adult: Secondary | ICD-10-CM | POA: Diagnosis not present

## 2013-04-29 DIAGNOSIS — Z923 Personal history of irradiation: Secondary | ICD-10-CM | POA: Diagnosis not present

## 2013-04-29 DIAGNOSIS — R0602 Shortness of breath: Secondary | ICD-10-CM | POA: Diagnosis not present

## 2013-04-29 DIAGNOSIS — R112 Nausea with vomiting, unspecified: Secondary | ICD-10-CM | POA: Diagnosis not present

## 2013-04-29 DIAGNOSIS — E876 Hypokalemia: Secondary | ICD-10-CM | POA: Diagnosis present

## 2013-04-29 DIAGNOSIS — Z8249 Family history of ischemic heart disease and other diseases of the circulatory system: Secondary | ICD-10-CM | POA: Diagnosis not present

## 2013-04-29 DIAGNOSIS — C549 Malignant neoplasm of corpus uteri, unspecified: Secondary | ICD-10-CM

## 2013-04-29 DIAGNOSIS — Z87891 Personal history of nicotine dependence: Secondary | ICD-10-CM | POA: Diagnosis not present

## 2013-04-29 DIAGNOSIS — K5289 Other specified noninfective gastroenteritis and colitis: Secondary | ICD-10-CM | POA: Diagnosis present

## 2013-04-29 DIAGNOSIS — R141 Gas pain: Secondary | ICD-10-CM | POA: Diagnosis present

## 2013-04-29 DIAGNOSIS — D72819 Decreased white blood cell count, unspecified: Secondary | ICD-10-CM | POA: Diagnosis present

## 2013-04-29 DIAGNOSIS — K56609 Unspecified intestinal obstruction, unspecified as to partial versus complete obstruction: Secondary | ICD-10-CM | POA: Diagnosis not present

## 2013-04-29 DIAGNOSIS — Z8542 Personal history of malignant neoplasm of other parts of uterus: Secondary | ICD-10-CM | POA: Diagnosis not present

## 2013-04-29 DIAGNOSIS — Z833 Family history of diabetes mellitus: Secondary | ICD-10-CM | POA: Diagnosis not present

## 2013-04-29 DIAGNOSIS — E7800 Pure hypercholesterolemia, unspecified: Secondary | ICD-10-CM | POA: Diagnosis present

## 2013-04-29 DIAGNOSIS — I10 Essential (primary) hypertension: Secondary | ICD-10-CM | POA: Diagnosis present

## 2013-04-29 DIAGNOSIS — R109 Unspecified abdominal pain: Secondary | ICD-10-CM | POA: Diagnosis not present

## 2013-04-29 DIAGNOSIS — Z124 Encounter for screening for malignant neoplasm of cervix: Secondary | ICD-10-CM | POA: Insufficient documentation

## 2013-04-29 DIAGNOSIS — E46 Unspecified protein-calorie malnutrition: Secondary | ICD-10-CM | POA: Diagnosis present

## 2013-04-29 DIAGNOSIS — J309 Allergic rhinitis, unspecified: Secondary | ICD-10-CM | POA: Diagnosis present

## 2013-04-29 DIAGNOSIS — E785 Hyperlipidemia, unspecified: Secondary | ICD-10-CM | POA: Diagnosis present

## 2013-04-29 NOTE — Progress Notes (Signed)
Lanyiah Riddle here for follow up after treatment for endometrial cancer.  She denies pain except for some ear pain due to sinus issues and vertigo.   She denies vaginal bleeding, bladder issues, fatigue and bowel issues.  She was hospitalized in October with a twisted intestine.  She reports she has been using her dilator 2-3 times per week.

## 2013-04-29 NOTE — Progress Notes (Signed)
Radiation Oncology         [phone]) [phone] ________________________________  Name: [name] MRN: [id number]  Date: 04/29/2013  DOB: 1940-07-21  Follow-Up Visit Note  CC: Haywood Pao, MD  Sherol Dade., MD  Diagnosis:   Recurrent endometrial cancer  Interval Since Last Radiation:  2 years and 7 months   Narrative:  The patient returns today for routine follow-up.  Interval history is significant for the patient being in the hospital for bowel obstruction which resolved with conservative management. There was noted to have some changes along the pubic bone questionable for recurrence. PET scan was performed showing no evidence of recurrence. She denies any vaginal bleeding urination difficulties or bowel complaints. She denies any pain along the low back or pelvis region. She continues to use her vaginal dilator.                              ALLERGIES:  is allergic to phenobarbital.  Meds: Current Outpatient Prescriptions  Medication Sig Dispense Refill  . aspirin 325 MG tablet Take 325 mg by mouth daily.      . benazepril (LOTENSIN) 20 MG tablet Take 20 mg by mouth daily.      . calcium carbonate (OS-CAL) 600 MG TABS tablet Take 1 tablet (600 mg total) by mouth 2 (two) times daily with a meal. With vitamin d  60 tablet  11  . cholecalciferol (VITAMIN D) 1000 UNITS tablet Take 1,000 Units by mouth daily.      . diclofenac sodium (VOLTAREN) 1 % GEL Apply topically 2 (two) times daily as needed.      . docusate sodium (COLACE) 100 MG capsule Take 100 mg by mouth 2 (two) times daily as needed.      . fexofenadine (ALLEGRA) 60 MG tablet Take 60 mg by mouth daily.      . fish oil-omega-3 fatty acids 1000 MG capsule Take 1,200 mg by mouth daily.      . Folic Acid-Vit N5-AOZ H08 (FOLBEE) 2.5-25-1 MG TABS Take 1 tablet by mouth daily.      Marland Kitchen ibuprofen (ADVIL,MOTRIN) 200 MG tablet Take 200 mg by mouth every 6 (six) hours as needed.      . meclizine (ANTIVERT) 25 MG tablet Take 25 mg  by mouth 3 (three) times daily as needed.      . Multiple Vitamin (MULTIVITAMIN) tablet Take 1 tablet by mouth daily. Centrum silver      . raloxifene (EVISTA) 60 MG tablet Take 60 mg by mouth daily.      . simvastatin (ZOCOR) 40 MG tablet Take 40 mg by mouth every evening.       No current facility-administered medications for this encounter.    Physical Findings: The patient is in no acute distress. Patient is alert and oriented.  height is 5\' 6"  (1.676 m) and weight is 162 lb 14.4 oz (73.891 kg). Her temperature is 97.8 F (36.6 C). Her blood pressure is 168/75 and her pulse is 102. Her oxygen saturation is 98%. .  No palpable supraclavicular or axillary adenopathy. The lungs are clear to auscultation. The heart has a regular rhythm and rate. The abdomen is soft and nontender with normal bowel sounds. No rebound or guarding is present. No palpable recurrence along the previous area of electron treatment. Hyperpigmentation changes persist in this area. The inguinal areas are free of adenopathy. The pelvic examination the external genitalia are unremarkable. A speculum exam  is performed. There are no mucosal lesions noted. A Pap smear was obtained of the proximal vagina. On bimanual and rectovaginal examination there no pelvic masses appreciated.  Lab Findings: Lab Results  Component Value Date   WBC 3.4* 02/03/2013   HGB 11.0* 02/03/2013   HCT 32.9* 02/03/2013   MCV 92.2 02/03/2013   PLT 215 02/03/2013      Radiographic Findings: No results found.  Impression:  No evidence of recurrence on clinical exam today, Pap smear pending  Plan:  Routine followup in 6 months. Patient will be seen by gynecologic oncology in the interim.  ____________________________________ Blair Promise, MD

## 2013-04-30 ENCOUNTER — Other Ambulatory Visit (HOSPITAL_COMMUNITY)
Admission: RE | Admit: 2013-04-30 | Discharge: 2013-04-30 | Disposition: A | Discharge status: Home / Self Care | Payer: Medicare Other | Source: Ambulatory Visit | Attending: Radiation Oncology | Admitting: Radiation Oncology

## 2013-05-06 ENCOUNTER — Telehealth: Payer: Self-pay | Admitting: Oncology

## 2013-05-06 NOTE — Telephone Encounter (Addendum)
Called and left a message for [name] to call back about her good test results.  [name] called back and she was advised of her good pap smear results per Dr. Sondra Come.

## 2013-05-15 DIAGNOSIS — K56609 Unspecified intestinal obstruction, unspecified as to partial versus complete obstruction: Secondary | ICD-10-CM | POA: Diagnosis not present

## 2013-05-15 DIAGNOSIS — R112 Nausea with vomiting, unspecified: Secondary | ICD-10-CM | POA: Diagnosis not present

## 2013-05-15 DIAGNOSIS — Z6826 Body mass index (BMI) 26.0-26.9, adult: Secondary | ICD-10-CM | POA: Diagnosis not present

## 2013-05-16 ENCOUNTER — Encounter (HOSPITAL_BASED_OUTPATIENT_CLINIC_OR_DEPARTMENT_OTHER): Payer: Self-pay | Admitting: Emergency Medicine

## 2013-05-16 ENCOUNTER — Inpatient Hospital Stay (HOSPITAL_BASED_OUTPATIENT_CLINIC_OR_DEPARTMENT_OTHER)
Admission: EM | Admit: 2013-05-16 | Discharge: 2013-05-20 | DRG: 389 | Disposition: A | Discharge status: Home / Self Care | Payer: Medicare Other | Attending: Internal Medicine | Admitting: Internal Medicine

## 2013-05-16 ENCOUNTER — Emergency Department (HOSPITAL_BASED_OUTPATIENT_CLINIC_OR_DEPARTMENT_OTHER): Payer: Medicare Other

## 2013-05-16 DIAGNOSIS — E876 Hypokalemia: Secondary | ICD-10-CM | POA: Diagnosis present

## 2013-05-16 DIAGNOSIS — K5289 Other specified noninfective gastroenteritis and colitis: Secondary | ICD-10-CM | POA: Diagnosis present

## 2013-05-16 DIAGNOSIS — Z923 Personal history of irradiation: Secondary | ICD-10-CM

## 2013-05-16 DIAGNOSIS — J309 Allergic rhinitis, unspecified: Secondary | ICD-10-CM | POA: Diagnosis present

## 2013-05-16 DIAGNOSIS — R0602 Shortness of breath: Secondary | ICD-10-CM | POA: Diagnosis not present

## 2013-05-16 DIAGNOSIS — E7800 Pure hypercholesterolemia, unspecified: Secondary | ICD-10-CM | POA: Diagnosis present

## 2013-05-16 DIAGNOSIS — Z9221 Personal history of antineoplastic chemotherapy: Secondary | ICD-10-CM

## 2013-05-16 DIAGNOSIS — K56609 Unspecified intestinal obstruction, unspecified as to partial versus complete obstruction: Principal | ICD-10-CM | POA: Diagnosis present

## 2013-05-16 DIAGNOSIS — R143 Flatulence: Secondary | ICD-10-CM

## 2013-05-16 DIAGNOSIS — R109 Unspecified abdominal pain: Secondary | ICD-10-CM | POA: Diagnosis not present

## 2013-05-16 DIAGNOSIS — E46 Unspecified protein-calorie malnutrition: Secondary | ICD-10-CM | POA: Diagnosis present

## 2013-05-16 DIAGNOSIS — Z833 Family history of diabetes mellitus: Secondary | ICD-10-CM

## 2013-05-16 DIAGNOSIS — C549 Malignant neoplasm of corpus uteri, unspecified: Secondary | ICD-10-CM | POA: Diagnosis not present

## 2013-05-16 DIAGNOSIS — Z87891 Personal history of nicotine dependence: Secondary | ICD-10-CM

## 2013-05-16 DIAGNOSIS — R112 Nausea with vomiting, unspecified: Secondary | ICD-10-CM | POA: Diagnosis not present

## 2013-05-16 DIAGNOSIS — E785 Hyperlipidemia, unspecified: Secondary | ICD-10-CM | POA: Diagnosis present

## 2013-05-16 DIAGNOSIS — I10 Essential (primary) hypertension: Secondary | ICD-10-CM | POA: Diagnosis present

## 2013-05-16 DIAGNOSIS — D72819 Decreased white blood cell count, unspecified: Secondary | ICD-10-CM | POA: Diagnosis present

## 2013-05-16 DIAGNOSIS — Z8249 Family history of ischemic heart disease and other diseases of the circulatory system: Secondary | ICD-10-CM

## 2013-05-16 DIAGNOSIS — R141 Gas pain: Secondary | ICD-10-CM | POA: Diagnosis present

## 2013-05-16 DIAGNOSIS — Z803 Family history of malignant neoplasm of breast: Secondary | ICD-10-CM

## 2013-05-16 DIAGNOSIS — Z6826 Body mass index (BMI) 26.0-26.9, adult: Secondary | ICD-10-CM

## 2013-05-16 DIAGNOSIS — Z8542 Personal history of malignant neoplasm of other parts of uterus: Secondary | ICD-10-CM

## 2013-05-16 DIAGNOSIS — R142 Eructation: Secondary | ICD-10-CM | POA: Diagnosis present

## 2013-05-16 HISTORY — DX: Unspecified intestinal obstruction, unspecified as to partial versus complete obstruction: K56.609

## 2013-05-16 LAB — CBC WITH DIFFERENTIAL/PLATELET
Basophils Absolute: 0 10*3/uL (ref 0.0–0.1)
Basophils Relative: 0 % (ref 0–1)
EOS PCT: 0 % (ref 0–5)
Eosinophils Absolute: 0 10*3/uL (ref 0.0–0.7)
HCT: 42.5 % (ref 36.0–46.0)
HEMOGLOBIN: 14.1 g/dL (ref 12.0–15.0)
LYMPHS PCT: 14 % (ref 12–46)
Lymphs Abs: 1.5 10*3/uL (ref 0.7–4.0)
MCH: 30.7 pg (ref 26.0–34.0)
MCHC: 33.2 g/dL (ref 30.0–36.0)
MCV: 92.4 fL (ref 78.0–100.0)
MONOS PCT: 6 % (ref 3–12)
Monocytes Absolute: 0.7 10*3/uL (ref 0.1–1.0)
NEUTROS ABS: 8.6 10*3/uL — AB (ref 1.7–7.7)
Neutrophils Relative %: 80 % — ABNORMAL HIGH (ref 43–77)
Platelets: 270 10*3/uL (ref 150–400)
RBC: 4.6 MIL/uL (ref 3.87–5.11)
RDW: 14.3 % (ref 11.5–15.5)
WBC: 10.8 10*3/uL — AB (ref 4.0–10.5)

## 2013-05-16 LAB — COMPREHENSIVE METABOLIC PANEL
ALT: 25 U/L (ref 0–35)
AST: 28 U/L (ref 0–37)
Albumin: 3.8 g/dL (ref 3.5–5.2)
Alkaline Phosphatase: 80 U/L (ref 39–117)
BILIRUBIN TOTAL: 0.5 mg/dL (ref 0.3–1.2)
BUN: 19 mg/dL (ref 6–23)
CALCIUM: 9.5 mg/dL (ref 8.4–10.5)
CHLORIDE: 99 meq/L (ref 96–112)
CO2: 23 meq/L (ref 19–32)
CREATININE: 1.1 mg/dL (ref 0.50–1.10)
GFR, EST AFRICAN AMERICAN: 56 mL/min — AB (ref >90)
GFR, EST NON AFRICAN AMERICAN: 49 mL/min — AB (ref >90)
GLUCOSE: 162 mg/dL — AB (ref 70–99)
Potassium: 4.2 mEq/L (ref 3.7–5.3)
Sodium: 140 mEq/L (ref 137–147)
Total Protein: 6.8 g/dL (ref 6.0–8.3)

## 2013-05-16 LAB — URINALYSIS, ROUTINE W REFLEX MICROSCOPIC
Bilirubin Urine: NEGATIVE
Glucose, UA: NEGATIVE mg/dL
HGB URINE DIPSTICK: NEGATIVE
Ketones, ur: 15 mg/dL — AB
NITRITE: NEGATIVE
PROTEIN: 30 mg/dL — AB
SPECIFIC GRAVITY, URINE: 1.025 (ref 1.005–1.030)
UROBILINOGEN UA: 0.2 mg/dL (ref 0.0–1.0)
pH: 5 (ref 5.0–8.0)

## 2013-05-16 LAB — URINE MICROSCOPIC-ADD ON

## 2013-05-16 MED ORDER — ONDANSETRON HCL 4 MG/2ML IJ SOLN
INTRAMUSCULAR | Status: AC
  Filled 2013-05-16: qty 2

## 2013-05-16 MED ORDER — IOHEXOL 300 MG/ML  SOLN
100.0000 mL | Freq: Once | INTRAMUSCULAR | Status: AC | PRN
  Administered 2013-05-16: 100 mL via INTRAVENOUS

## 2013-05-16 MED ORDER — HYDROMORPHONE HCL PF 1 MG/ML IJ SOLN
1.0000 mg | Freq: Once | INTRAMUSCULAR | Status: AC
  Administered 2013-05-16: 1 mg via INTRAVENOUS

## 2013-05-16 MED ORDER — HYDROMORPHONE HCL PF 1 MG/ML IJ SOLN
INTRAMUSCULAR | Status: AC
  Filled 2013-05-16: qty 1

## 2013-05-16 MED ORDER — ONDANSETRON HCL 4 MG/2ML IJ SOLN
4.0000 mg | Freq: Once | INTRAMUSCULAR | Status: AC
  Administered 2013-05-16: 4 mg via INTRAVENOUS

## 2013-05-16 MED ORDER — SODIUM CHLORIDE 0.9 % IV BOLUS (SEPSIS)
1000.0000 mL | Freq: Once | INTRAVENOUS | Status: AC
  Administered 2013-05-16: 1000 mL via INTRAVENOUS

## 2013-05-16 MED ORDER — IOHEXOL 300 MG/ML  SOLN
50.0000 mL | Freq: Once | INTRAMUSCULAR | Status: AC | PRN
  Administered 2013-05-16: 50 mL via ORAL

## 2013-05-16 MED ORDER — MORPHINE SULFATE 4 MG/ML IJ SOLN
4.0000 mg | Freq: Once | INTRAMUSCULAR | Status: AC
  Administered 2013-05-16: 4 mg via INTRAVENOUS
  Filled 2013-05-16: qty 1

## 2013-05-16 MED ORDER — ONDANSETRON HCL 4 MG/2ML IJ SOLN
4.0000 mg | Freq: Once | INTRAMUSCULAR | Status: AC
  Administered 2013-05-16: 4 mg via INTRAVENOUS
  Filled 2013-05-16: qty 2

## 2013-05-16 NOTE — ED Notes (Signed)
Pt c/o abdominal pain yesterday with h/o "twisted intestines". Pt sts pain improved with vomiting.

## 2013-05-16 NOTE — ED Provider Notes (Signed)
Medical screening examination/treatment/procedure(s) were conducted as a shared visit with non-physician practitioner(s) and myself.  I personally evaluated the patient during the encounter.  EKG Interpretation    Date/Time:    Ventricular Rate:    PR Interval:    QRS Duration:   QT Interval:    QTC Calculation:   R Axis:     Text Interpretation:              Pt with hx of abd radiation and hx of sbo who present with pain/nausea/vomiting.  CT of abd showed 2 areas of partial SBO.  Blanchie Dessert, MD 05/16/13 [phone]

## 2013-05-16 NOTE — ED Notes (Addendum)
Patient denies pain.

## 2013-05-16 NOTE — ED Notes (Signed)
Patient transported to CT 

## 2013-05-16 NOTE — ED Provider Notes (Signed)
CSN: [id number]     Arrival date & time 05/16/13  1527 History   First MD Initiated Contact with Patient 05/16/13 1559     Chief Complaint  Patient presents with  . Abdominal Pain   (Consider location/radiation/quality/duration/timing/severity/associated sxs/prior Treatment) HPI Comments: Patient is a 73 year old female with a past medical history of previous SBO, hypertension, and endometrial cancer who presents with abdominal pain since yesterday. The pain is located periumbilically and does not radiate. The pain is described as aching and severe. The pain started gradually and progressively worsened since the onset. No alleviating/aggravating factors. The patient has tried nothing for symptoms without relief. Associated symptoms include nausea and vomiting. Patient denies fever, headache, diarrhea, chest pain, SOB, dysuria, constipation, abnormal vaginal bleeding/discharge.     Patient is a 73 y.o. female presenting with abdominal pain.  Abdominal Pain Associated symptoms: diarrhea and nausea   Associated symptoms: no chest pain, no chills, no dysuria, no fatigue, no fever, no shortness of breath and no vomiting     Past Medical History  Diagnosis Date  . Status post chemotherapy     carboplatin/paclitaxel x 6 rounds  . Hypercholesterolemia   . History of colonic polyps   . Bursitis of shoulder, left   . Cancer     Endometrial ca/Recurrence  . S/P radiation therapy July 29, 2010-Sep 06, 2010    External beam of pelvis  . S/P radiation therapy 09/15/10, 09/24/10, 09/28/2010    Intracavitary brachytherapy  . Lumbar pain   . Hypercholesteremia   . Allergy   . Hypertension    Past Surgical History  Procedure Laterality Date  . Abdominal hysterectomy    . Robotic assisted laparoscopic vaginal hysterectomy with fibroid removal  July 2010    bilat. salpingo-oophorectomy  . Breast lumpectomy  1993    Left breast, benign   Family History  Problem Relation Age of Onset  . Breast  cancer Mother   . Heart disease Father   . Breast cancer Sister   . Diabetes Sister    History  Substance Use Topics  . Smoking status: Former Smoker -- 1.00 packs/day    Types: Cigarettes    Quit date: 05/12/1976  . Smokeless tobacco: Never Used  . Alcohol Use: No   OB History   Grav Para Term Preterm Abortions TAB SAB Ect Mult Living                 Review of Systems  Constitutional: Negative for fever, chills and fatigue.  HENT: Negative for trouble swallowing.   Eyes: Negative for visual disturbance.  Respiratory: Negative for shortness of breath.   Cardiovascular: Negative for chest pain and palpitations.  Gastrointestinal: Positive for nausea, abdominal pain and diarrhea. Negative for vomiting.  Genitourinary: Negative for dysuria and difficulty urinating.  Musculoskeletal: Negative for arthralgias and neck pain.  Skin: Negative for color change.  Neurological: Negative for dizziness and weakness.  Psychiatric/Behavioral: Negative for dysphoric mood.    Allergies  Phenobarbital  Home Medications   Current Outpatient Rx  Name  Route  Sig  Dispense  Refill  . aspirin 325 MG tablet   Oral   Take 325 mg by mouth daily.         . benazepril (LOTENSIN) 20 MG tablet   Oral   Take 20 mg by mouth daily.         . calcium carbonate (OS-CAL) 600 MG TABS tablet   Oral   Take 1 tablet (600 mg total)  by mouth 2 (two) times daily with a meal. With vitamin d   60 tablet   11     Only take one calcium pill daily with food.   . cholecalciferol (VITAMIN D) 1000 UNITS tablet   Oral   Take 1,000 Units by mouth daily.         . diclofenac sodium (VOLTAREN) 1 % GEL   Topical   Apply topically 2 (two) times daily as needed.         . docusate sodium (COLACE) 100 MG capsule   Oral   Take 100 mg by mouth 2 (two) times daily as needed.         . fexofenadine (ALLEGRA) 60 MG tablet   Oral   Take 60 mg by mouth daily.         . fish oil-omega-3 fatty acids  1000 MG capsule   Oral   Take 1,200 mg by mouth daily.         . Folic Acid-Vit Q000111Q 123456 (FOLBEE) 2.5-25-1 MG TABS   Oral   Take 1 tablet by mouth daily.         Marland Kitchen ibuprofen (ADVIL,MOTRIN) 200 MG tablet   Oral   Take 200 mg by mouth every 6 (six) hours as needed.         . meclizine (ANTIVERT) 25 MG tablet   Oral   Take 25 mg by mouth 3 (three) times daily as needed.         . Multiple Vitamin (MULTIVITAMIN) tablet   Oral   Take 1 tablet by mouth daily. Centrum silver         . raloxifene (EVISTA) 60 MG tablet   Oral   Take 60 mg by mouth daily.         . simvastatin (ZOCOR) 40 MG tablet   Oral   Take 40 mg by mouth every evening.          BP 138/53  Pulse 133  Temp(Src) 98 F (36.7 C)  Wt 160 lb (72.576 kg)  SpO2 98% Physical Exam  Nursing note and vitals reviewed. Constitutional: She is oriented to person, place, and time. She appears well-developed and well-nourished. No distress.  HENT:  Head: Normocephalic and atraumatic.  Eyes: Conjunctivae and EOM are normal.  Neck: Normal range of motion.  Cardiovascular: Normal rate and regular rhythm.  Exam reveals no gallop and no friction rub.   No murmur heard. Pulmonary/Chest: Effort normal and breath sounds normal. She has no wheezes. She has no rales. She exhibits no tenderness.  Abdominal: Soft. She exhibits no distension. There is tenderness. There is no rebound and no guarding.  Periumbilical tenderness to palpation. No focal tenderness to palpation or peritoneal signs.   Musculoskeletal: Normal range of motion.  Neurological: She is alert and oriented to person, place, and time. Coordination normal.  Speech is goal-oriented. Moves limbs without ataxia.   Skin: Skin is warm and dry.  Psychiatric: She has a normal mood and affect. Her behavior is normal.    ED Course  Procedures (including critical care time) Labs Review Labs Reviewed  CBC WITH DIFFERENTIAL - Abnormal; Notable for the  following:    WBC 10.8 (*)    Neutrophils Relative % 80 (*)    Neutro Abs 8.6 (*)    All other components within normal limits  COMPREHENSIVE METABOLIC PANEL - Abnormal; Notable for the following:    Glucose, Bld 162 (*)    GFR calc non  Af Amer 49 (*)    GFR calc Af Amer 56 (*)    All other components within normal limits  URINALYSIS, ROUTINE W REFLEX MICROSCOPIC - Abnormal; Notable for the following:    APPearance CLOUDY (*)    Ketones, ur 15 (*)    Protein, ur 30 (*)    Leukocytes, UA SMALL (*)    All other components within normal limits  URINE MICROSCOPIC-ADD ON - Abnormal; Notable for the following:    Squamous Epithelial / LPF FEW (*)    Casts HYALINE CASTS (*)    All other components within normal limits   Imaging Review Ct Abdomen Pelvis W Contrast  05/16/2013   CLINICAL DATA:  Abdominal pain with nausea and vomiting. History of endometrial carcinoma.  EXAM: CT ABDOMEN AND PELVIS WITH CONTRAST  TECHNIQUE: Multidetector CT imaging of the abdomen and pelvis was performed using the standard protocol following bolus administration of intravenous contrast.  CONTRAST:  50mL OMNIPAQUE IOHEXOL 300 MG/ML SOLN, 164mL OMNIPAQUE IOHEXOL 300 MG/ML SOLN  COMPARISON:  CT scan dated 01/31/2013  FINDINGS: There are slightly distended loops of small bowel with a transition point in the pelvis consistent with partial small bowel obstruction. There is a second transition point in the left mid abdomen seen on image number 19 of series 6 consistent with a second area of partial small bowel obstruction.  There is ascites adjacent to the liver and in the pelvis, slightly increased since the prior study. There is some fluid loculated at the superior aspect of the dome of the bladder.  The liver, spleen, pancreas, adrenal glands, and kidneys are normal except for a 6 mm cyst on the lower pole of the right kidney. No acute osseous abnormality. Incompletely healed fractures of the left inferior and superior pubic  rami.  IMPRESSION: 1. 2 areas of what appear to be partial small bowel obstruction as described above. 2. Slight increase in abdominal ascites.   Electronically Signed   By: Rozetta Nunnery M.D.   On: 05/16/2013 20:30   Dg Abd Acute W/chest  05/16/2013   CLINICAL DATA:  Abdomen pain, nausea, vomiting  EXAM: ACUTE ABDOMEN SERIES (ABDOMEN 2 VIEW & CHEST 1 VIEW)  COMPARISON:  February 02, 2013  FINDINGS: There is no evidence of dilated bowel loops or free intraperitoneal air. No radiopaque calculi or other significant radiographic abnormality is seen. Heart size and mediastinal contours are within normal limits. Both lungs are clear. There is scoliosis of spine. Chronic posttraumatic deformity of the left superior pubic rami is identified.  IMPRESSION: Negative abdominal radiographs.  No acute cardiopulmonary disease.   Electronically Signed   By: Abelardo Diesel M.D.   On: 05/16/2013 16:36    EKG Interpretation   None       MDM   1. Small bowel obstruction     7:08 PM Patient's labs show slightly elevated WBC at 10.8. Patient given morphine and zofran for pain. Patient is tachycardic with remaining vitals stable. Acute abdominal series unremarkable for acute changes. CT abdomen pelvis pending.   9:17 PM CT shows small bowel obstruction. Dr. Virgina Jock will admit the patient for medical management. Patient will be transferred to [facility].   Alvina Chou, PA-C 05/16/13 2118

## 2013-05-17 ENCOUNTER — Encounter (HOSPITAL_COMMUNITY): Payer: Self-pay | Admitting: General Practice

## 2013-05-17 ENCOUNTER — Inpatient Hospital Stay (HOSPITAL_COMMUNITY): Payer: Medicare Other

## 2013-05-17 DIAGNOSIS — K56609 Unspecified intestinal obstruction, unspecified as to partial versus complete obstruction: Secondary | ICD-10-CM | POA: Insufficient documentation

## 2013-05-17 LAB — CBC
HEMATOCRIT: 35.7 % — AB (ref 36.0–46.0)
HEMOGLOBIN: 11.4 g/dL — AB (ref 12.0–15.0)
MCH: 29.8 pg (ref 26.0–34.0)
MCHC: 31.9 g/dL (ref 30.0–36.0)
MCV: 93.5 fL (ref 78.0–100.0)
Platelets: 194 10*3/uL (ref 150–400)
RBC: 3.82 MIL/uL — ABNORMAL LOW (ref 3.87–5.11)
RDW: 14.9 % (ref 11.5–15.5)
WBC: 6.9 10*3/uL (ref 4.0–10.5)

## 2013-05-17 LAB — BASIC METABOLIC PANEL
BUN: 17 mg/dL (ref 6–23)
CHLORIDE: 107 meq/L (ref 96–112)
CO2: 22 mEq/L (ref 19–32)
Calcium: 8.4 mg/dL (ref 8.4–10.5)
Creatinine, Ser: 0.95 mg/dL (ref 0.50–1.10)
GFR calc Af Amer: 67 mL/min — ABNORMAL LOW (ref >90)
GFR, EST NON AFRICAN AMERICAN: 58 mL/min — AB (ref >90)
Glucose, Bld: 109 mg/dL — ABNORMAL HIGH (ref 70–99)
POTASSIUM: 4.2 meq/L (ref 3.7–5.3)
Sodium: 141 mEq/L (ref 137–147)

## 2013-05-17 MED ORDER — HEPARIN SODIUM (PORCINE) 5000 UNIT/ML IJ SOLN
5000.0000 [IU] | Freq: Three times a day (TID) | INTRAMUSCULAR | Status: DC
  Administered 2013-05-17: 5000 [IU] via SUBCUTANEOUS
  Filled 2013-05-17 (×4): qty 1

## 2013-05-17 MED ORDER — PSEUDOEPHEDRINE HCL ER 120 MG PO TB12
120.0000 mg | ORAL_TABLET | Freq: Two times a day (BID) | ORAL | Status: DC
  Administered 2013-05-17: 120 mg via ORAL
  Filled 2013-05-17 (×3): qty 1

## 2013-05-17 MED ORDER — ACETAMINOPHEN 325 MG PO TABS
650.0000 mg | ORAL_TABLET | Freq: Four times a day (QID) | ORAL | Status: DC | PRN
  Administered 2013-05-17 – 2013-05-18 (×3): 650 mg via ORAL
  Filled 2013-05-17 (×3): qty 2

## 2013-05-17 MED ORDER — SODIUM CHLORIDE 0.9 % IV SOLN
INTRAVENOUS | Status: DC
  Administered 2013-05-18 – 2013-05-19 (×3): via INTRAVENOUS

## 2013-05-17 MED ORDER — HYDROMORPHONE HCL PF 1 MG/ML IJ SOLN: 1.0000 mg | INTRAMUSCULAR | Status: DC | PRN

## 2013-05-17 MED ORDER — KETOROLAC TROMETHAMINE 15 MG/ML IJ SOLN
15.0000 mg | Freq: Once | INTRAMUSCULAR | Status: AC
  Administered 2013-05-17: 15 mg via INTRAVENOUS
  Filled 2013-05-17: qty 1

## 2013-05-17 MED ORDER — KETOROLAC TROMETHAMINE 60 MG/2ML IM SOLN
60.0000 mg | Freq: Once | INTRAMUSCULAR | Status: DC
  Filled 2013-05-17: qty 2

## 2013-05-17 MED ORDER — ACETAMINOPHEN 650 MG RE SUPP: 650.0000 mg | Freq: Four times a day (QID) | RECTAL | Status: DC | PRN

## 2013-05-17 MED ORDER — LORATADINE 10 MG PO TABS
10.0000 mg | ORAL_TABLET | Freq: Every day | ORAL | Status: DC
  Administered 2013-05-18 – 2013-05-20 (×3): 10 mg via ORAL
  Filled 2013-05-17 (×3): qty 1

## 2013-05-17 MED ORDER — SODIUM CHLORIDE 0.9 % IV SOLN
INTRAVENOUS | Status: AC
  Administered 2013-05-17: 01:00:00 via INTRAVENOUS

## 2013-05-17 MED ORDER — ONDANSETRON HCL 4 MG/2ML IJ SOLN: 4.0000 mg | Freq: Four times a day (QID) | INTRAMUSCULAR | Status: AC | PRN

## 2013-05-17 MED ORDER — ENOXAPARIN SODIUM 40 MG/0.4ML ~~LOC~~ SOLN
40.0000 mg | Freq: Every day | SUBCUTANEOUS | Status: DC
  Administered 2013-05-17 – 2013-05-20 (×4): 40 mg via SUBCUTANEOUS
  Filled 2013-05-17 (×4): qty 0.4

## 2013-05-17 MED ORDER — ONDANSETRON HCL 4 MG/2ML IJ SOLN: 4.0000 mg | Freq: Three times a day (TID) | INTRAMUSCULAR | Status: DC | PRN

## 2013-05-17 NOTE — Progress Notes (Signed)
Utilization review completed. Kailani Brass, RN, BSN. 

## 2013-05-17 NOTE — H&P (Signed)
PCP:   Haywood Pao, MD   Chief Complaint:  Abd pain, nausea and vomiting  HPI: [name] os a 73 year old female with history of prior SBO who started having some nausea and vomiting 2 days ago.  Her work nurse claimed to not hear bowel sounds and she was assessed in my office the end of the day on Wednesday.  She noted having eaten expired yogurt.  At that time she clearly had normal bowel sounds, had had 3 loose BM's earlier in the day and a normal abdominal exam.  Given Phenergan and instructions for BRAT diet.  Yesterday she started having more nausea accompanied with pain, which she had not had the day prior.  Went to [facility], had a normal appearing X ray but her CT showed 2 areas of possible partial SBO.  She was transferred to [facility], arriving in the early hours of today and was admitted for further management.  She states that she feels considerably less bloated this AM than she was last night in the ER.  Feels like she has to use the bathroom this AM  Review of Systems:  Review of Systems - Negative except as noted per HPI, 12 point review completed. Past Medical History: Past Medical History  Diagnosis Date  . Status post chemotherapy     carboplatin/paclitaxel x 6 rounds  . Hypercholesterolemia   . History of colonic polyps   . Bursitis of shoulder, left   . Cancer     Endometrial ca/Recurrence  . S/P radiation therapy July 29, 2010-Sep 06, 2010    External beam of pelvis  . S/P radiation therapy 09/15/10, 09/24/10, 09/28/2010    Intracavitary brachytherapy  . Lumbar pain   . Hypercholesteremia   . Allergy   . Hypertension    Past Surgical History  Procedure Laterality Date  . Abdominal hysterectomy    . Robotic assisted laparoscopic vaginal hysterectomy with fibroid removal  July 2010    bilat. salpingo-oophorectomy  . Breast lumpectomy  1993    Left breast, benign    Medications: Prior to Admission medications   Medication Sig Start Date End  Date Taking? Authorizing Provider  aspirin 325 MG tablet Take 325 mg by mouth daily.    Historical Provider, MD  benazepril (LOTENSIN) 20 MG tablet Take 20 mg by mouth daily.    Historical Provider, MD  calcium carbonate (OS-CAL) 600 MG TABS tablet Take 1 tablet (600 mg total) by mouth 2 (two) times daily with a meal. With vitamin d 02/03/13   Jerlyn Ly, MD  cholecalciferol (VITAMIN D) 1000 UNITS tablet Take 1,000 Units by mouth daily.    Historical Provider, MD  diclofenac sodium (VOLTAREN) 1 % GEL Apply topically 2 (two) times daily as needed.    Historical Provider, MD  docusate sodium (COLACE) 100 MG capsule Take 100 mg by mouth 2 (two) times daily as needed.    Historical Provider, MD  fexofenadine (ALLEGRA) 60 MG tablet Take 60 mg by mouth daily.    Historical Provider, MD  fish oil-omega-3 fatty acids 1000 MG capsule Take 1,200 mg by mouth daily.    Historical Provider, MD  Folic Acid-Vit B3-ALP F79 (FOLBEE) 2.5-25-1 MG TABS Take 1 tablet by mouth daily.    Historical Provider, MD  ibuprofen (ADVIL,MOTRIN) 200 MG tablet Take 200 mg by mouth every 6 (six) hours as needed.    Historical Provider, MD  meclizine (ANTIVERT) 25 MG tablet Take 25 mg by mouth 3 (three)  times daily as needed.    Historical Provider, MD  Multiple Vitamin (MULTIVITAMIN) tablet Take 1 tablet by mouth daily. Centrum silver    Historical Provider, MD  raloxifene (EVISTA) 60 MG tablet Take 60 mg by mouth daily.    Historical Provider, MD  simvastatin (ZOCOR) 40 MG tablet Take 40 mg by mouth every evening.    Historical Provider, MD    Allergies:   Allergies  Allergen Reactions  . Phenobarbital     Feeling of uneasiness    Social History:  reports that she quit smoking about 37 years ago. Her smoking use included Cigarettes. She smoked 1.00 pack per day. She has never used smokeless tobacco. She reports that she does not drink alcohol or use illicit drugs.  Family History: Family History  Problem Relation  Age of Onset  . Breast cancer Mother   . Heart disease Father   . Breast cancer Sister   . Diabetes Sister     Physical Exam: Filed Vitals:   05/16/13 1533 05/16/13 2142 05/16/13 2301 05/17/13 0545  BP: 138/53 110/81 153/68 121/48  Pulse: 133 92 95 85  Temp: 98 F (36.7 C)  98.2 F (36.8 C) 97.6 F (36.4 C)  TempSrc:   Oral Oral  Resp:   16 16  Height:   5\' 6"  (1.676 m)   Weight: 72.576 kg (160 lb)  73.71 kg (162 lb 8 oz) 73.71 kg (162 lb 8 oz)  SpO2: 98% 99% 94% 96%   General appearance: alert, cooperative and appears stated age Head: Normocephalic, without obvious abnormality, atraumatic Eyes: conjunctivae/corneas clear. PERRL, EOM's intact.  Nose: Nares normal. Septum midline. Mucosa normal. No drainage or sinus tenderness. Throat: lips, mucosa, and tongue normal; teeth and gums normal Neck: no adenopathy, no carotid bruit, no JVD and thyroid not enlarged, symmetric, no tenderness/mass/nodules Resp: clear to auscultation bilaterally Cardio: regular rate and rhythm, S1, S2 normal, no murmur, click, rub or gallop GI: soft, mildly tender; bowel sounds normal; no masses,  no organomegaly Extremities: extremities normal, atraumatic, no cyanosis or edema Pulses: 2+ and symmetric Lymph nodes: Cervical adenopathy: no cervical lymphadenopathy Neurologic: Alert and oriented X 3, normal strength and tone. Normal symmetric reflexes.     Labs on Admission:   Recent Labs  05/16/13 1557 05/17/13 0435  NA 140 141  K 4.2 4.2  CL 99 107  CO2 23 22  GLUCOSE 162* 109*  BUN 19 17  CREATININE 1.10 0.95  CALCIUM 9.5 8.4    Recent Labs  05/16/13 1557  AST 28  ALT 25  ALKPHOS 80  BILITOT 0.5  PROT 6.8  ALBUMIN 3.8    Recent Labs  05/16/13 1557 05/17/13 0435  WBC 10.8* 6.9  NEUTROABS 8.6*  --   HGB 14.1 11.4*  HCT 42.5 35.7*  MCV 92.4 93.5  PLT 270 194    Radiological Exams on Admission: Ct Abdomen Pelvis W Contrast  05/16/2013   CLINICAL DATA:  Abdominal pain  with nausea and vomiting. History of endometrial carcinoma.  EXAM: CT ABDOMEN AND PELVIS WITH CONTRAST  TECHNIQUE: Multidetector CT imaging of the abdomen and pelvis was performed using the standard protocol following bolus administration of intravenous contrast.  CONTRAST:  25mL OMNIPAQUE IOHEXOL 300 MG/ML SOLN, 169mL OMNIPAQUE IOHEXOL 300 MG/ML SOLN  COMPARISON:  CT scan dated 01/31/2013  FINDINGS: There are slightly distended loops of small bowel with a transition point in the pelvis consistent with partial small bowel obstruction. There is a second transition point in  the left mid abdomen seen on image number 19 of series 6 consistent with a second area of partial small bowel obstruction.  There is ascites adjacent to the liver and in the pelvis, slightly increased since the prior study. There is some fluid loculated at the superior aspect of the dome of the bladder.  The liver, spleen, pancreas, adrenal glands, and kidneys are normal except for a 6 mm cyst on the lower pole of the right kidney. No acute osseous abnormality. Incompletely healed fractures of the left inferior and superior pubic rami.  IMPRESSION: 1. 2 areas of what appear to be partial small bowel obstruction as described above. 2. Slight increase in abdominal ascites.   Electronically Signed   By: Rozetta Nunnery M.D.   On: 05/16/2013 20:30   Dg Abd Acute W/chest  05/16/2013   CLINICAL DATA:  Abdomen pain, nausea, vomiting  EXAM: ACUTE ABDOMEN SERIES (ABDOMEN 2 VIEW & CHEST 1 VIEW)  COMPARISON:  February 02, 2013  FINDINGS: There is no evidence of dilated bowel loops or free intraperitoneal air. No radiopaque calculi or other significant radiographic abnormality is seen. Heart size and mediastinal contours are within normal limits. Both lungs are clear. There is scoliosis of spine. Chronic posttraumatic deformity of the left superior pubic rami is identified.  IMPRESSION: Negative abdominal radiographs.  No acute cardiopulmonary disease.    Electronically Signed   By: Abelardo Diesel M.D.   On: 05/16/2013 16:36    Assessment/Plan Active Problems:   Small bowel obstruction   SBO (small bowel obstruction) Bowel rest, NPO, due to her recent endometrial CA and surgery, where she also had SBO, will get surgical consultation.  F/U plain film tomorrow, although was not readily visible on plain film in ER.  This is likely a sequelae of the gastroenteritis that I diagnosed her with and the SBO when viewed on the CT is minimal.  She has shown clinical improvement even since she finally arrived at [facility] after her later hour transfer from [facility]. Lovenox for DVT prophylaxis Will hold her BP and lipid meds until able to tolerate clears. Pain meds PRN, limiting to not make SBO worse. Ambulate.  [name] W 05/17/2013, 7:23 AM

## 2013-05-18 ENCOUNTER — Inpatient Hospital Stay (HOSPITAL_COMMUNITY): Payer: Medicare Other

## 2013-05-18 DIAGNOSIS — C549 Malignant neoplasm of corpus uteri, unspecified: Secondary | ICD-10-CM | POA: Diagnosis not present

## 2013-05-18 DIAGNOSIS — I10 Essential (primary) hypertension: Secondary | ICD-10-CM | POA: Diagnosis present

## 2013-05-18 DIAGNOSIS — K56609 Unspecified intestinal obstruction, unspecified as to partial versus complete obstruction: Secondary | ICD-10-CM | POA: Diagnosis not present

## 2013-05-18 DIAGNOSIS — R0602 Shortness of breath: Secondary | ICD-10-CM | POA: Diagnosis not present

## 2013-05-18 LAB — COMPREHENSIVE METABOLIC PANEL
ALBUMIN: 2.9 g/dL — AB (ref 3.5–5.2)
ALT: 17 U/L (ref 0–35)
AST: 21 U/L (ref 0–37)
Alkaline Phosphatase: 61 U/L (ref 39–117)
BUN: 10 mg/dL (ref 6–23)
CALCIUM: 8.4 mg/dL (ref 8.4–10.5)
CO2: 24 mEq/L (ref 19–32)
CREATININE: 0.82 mg/dL (ref 0.50–1.10)
Chloride: 106 mEq/L (ref 96–112)
GFR calc Af Amer: 80 mL/min — ABNORMAL LOW (ref >90)
GFR, EST NON AFRICAN AMERICAN: 69 mL/min — AB (ref >90)
Glucose, Bld: 100 mg/dL — ABNORMAL HIGH (ref 70–99)
Potassium: 3.5 mEq/L — ABNORMAL LOW (ref 3.7–5.3)
Sodium: 141 mEq/L (ref 137–147)
Total Bilirubin: 0.3 mg/dL (ref 0.3–1.2)
Total Protein: 5.6 g/dL — ABNORMAL LOW (ref 6.0–8.3)

## 2013-05-18 LAB — CBC
HCT: 34.2 % — ABNORMAL LOW (ref 36.0–46.0)
Hemoglobin: 11.2 g/dL — ABNORMAL LOW (ref 12.0–15.0)
MCH: 29.7 pg (ref 26.0–34.0)
MCHC: 32.7 g/dL (ref 30.0–36.0)
MCV: 90.7 fL (ref 78.0–100.0)
Platelets: 211 10*3/uL (ref 150–400)
RBC: 3.77 MIL/uL — ABNORMAL LOW (ref 3.87–5.11)
RDW: 14 % (ref 11.5–15.5)
WBC: 4.7 10*3/uL (ref 4.0–10.5)

## 2013-05-18 MED ORDER — POTASSIUM CHLORIDE CRYS ER 20 MEQ PO TBCR
40.0000 meq | EXTENDED_RELEASE_TABLET | Freq: Once | ORAL | Status: AC
  Administered 2013-05-18: 40 meq via ORAL
  Filled 2013-05-18: qty 2

## 2013-05-18 NOTE — Progress Notes (Signed)
Subjective: Was having loose stools yesterday but this has resolved.  Somer flatulence.  Abdominal bloating and distention persist.  Tolerating liquid diet- abdomen is sore.  Objective: Vital signs in last 24 hours: Temp:  [97.5 F (36.4 C)-98.1 F (36.7 C)] 97.5 F (36.4 C) (01/24 2774) Pulse Rate:  [86-88] 86 (01/24 0613) Resp:  [17-18] 18 (01/24 0613) BP: (139-147)/(64-71) 147/64 mmHg (01/24 0613) SpO2:  [95 %-97 %] 95 % (01/24 0613) Weight:  [73.71 kg (162 lb 8 oz)] 73.71 kg (162 lb 8 oz) (01/24 1287) Weight change: 1.135 kg (2 lb 8 oz) Last BM Date: 05/17/13  CBG (last 3)  No results found for this basename: GLUCAP,  in the last 72 hours  Intake/Output from previous day: 01/23 0701 - 01/24 0700 In: 1650 [I.V.:1650] Out: 1550 [Urine:1550] Intake/Output this shift:    General appearance: alert and no distress Eyes: no scleral icterus Throat: oropharynx moist without erythema Resp: clear to auscultation bilaterally Cardio: regular rate and rhythm GI: slightly distended with minimal upper abdominal bowel sounds and paucity of bowel sounds in lower abdomen.  Minimal diffuse tenderness without rebound or guarding. Extremities: no clubbing, cyanosis or edema   Lab Results:  Recent Labs  05/17/13 0435 05/18/13 0500  NA 141 141  K 4.2 3.5*  CL 107 106  CO2 22 24  GLUCOSE 109* 100*  BUN 17 10  CREATININE 0.95 0.82  CALCIUM 8.4 8.4    Recent Labs  05/16/13 1557 05/18/13 0500  AST 28 21  ALT 25 17  ALKPHOS 80 61  BILITOT 0.5 0.3  PROT 6.8 5.6*  ALBUMIN 3.8 2.9*    Recent Labs  05/16/13 1557 05/17/13 0435 05/18/13 0500  WBC 10.8* 6.9 4.7  NEUTROABS 8.6*  --   --   HGB 14.1 11.4* 11.2*  HCT 42.5 35.7* 34.2*  MCV 92.4 93.5 90.7  PLT 270 194 211   No results found for this basename: INR, PROTIME   No results found for this basename: CKTOTAL, CKMB, CKMBINDEX, TROPONINI,  in the last 72 hours No results found for this basename: TSH, T4TOTAL, FREET3,  T3FREE, THYROIDAB,  in the last 72 hours No results found for this basename: VITAMINB12, FOLATE, FERRITIN, TIBC, IRON, RETICCTPCT,  in the last 72 hours  Studies/Results: Ct Abdomen Pelvis W Contrast  05/16/2013   CLINICAL DATA:  Abdominal pain with nausea and vomiting. History of endometrial carcinoma.  EXAM: CT ABDOMEN AND PELVIS WITH CONTRAST  TECHNIQUE: Multidetector CT imaging of the abdomen and pelvis was performed using the standard protocol following bolus administration of intravenous contrast.  CONTRAST:  11mL OMNIPAQUE IOHEXOL 300 MG/ML SOLN, 19mL OMNIPAQUE IOHEXOL 300 MG/ML SOLN  COMPARISON:  CT scan dated 01/31/2013  FINDINGS: There are slightly distended loops of small bowel with a transition point in the pelvis consistent with partial small bowel obstruction. There is a second transition point in the left mid abdomen seen on image number 19 of series 6 consistent with a second area of partial small bowel obstruction.  There is ascites adjacent to the liver and in the pelvis, slightly increased since the prior study. There is some fluid loculated at the superior aspect of the dome of the bladder.  The liver, spleen, pancreas, adrenal glands, and kidneys are normal except for a 6 mm cyst on the lower pole of the right kidney. No acute osseous abnormality. Incompletely healed fractures of the left inferior and superior pubic rami.  IMPRESSION: 1. 2 areas of what appear to be  partial small bowel obstruction as described above. 2. Slight increase in abdominal ascites.   Electronically Signed   By: Rozetta Nunnery M.D.   On: 05/16/2013 20:30   Dg Abd 2 Views  05/18/2013   CLINICAL DATA:  Small bowel obstruction.  EXAM: ABDOMEN - 2 VIEW  COMPARISON:  Abdominal radiograph 05/17/2013.  FINDINGS: Gas and oral contrast material is again noted throughout the colon. Rectal gas is noted. No pathologic distention of small bowel. No gross pneumoperitoneum. Previously noted contrast in the urinary bladder is no  longer present.  IMPRESSION: 1. Nonobstructive bowel gas pattern. 2. No pneumoperitoneum.   Electronically Signed   By: Vinnie Langton M.D.   On: 05/18/2013 08:57   Dg Abd Acute W/chest  05/16/2013   CLINICAL DATA:  Abdomen pain, nausea, vomiting  EXAM: ACUTE ABDOMEN SERIES (ABDOMEN 2 VIEW & CHEST 1 VIEW)  COMPARISON:  February 02, 2013  FINDINGS: There is no evidence of dilated bowel loops or free intraperitoneal air. No radiopaque calculi or other significant radiographic abnormality is seen. Heart size and mediastinal contours are within normal limits. Both lungs are clear. There is scoliosis of spine. Chronic posttraumatic deformity of the left superior pubic rami is identified.  IMPRESSION: Negative abdominal radiographs.  No acute cardiopulmonary disease.   Electronically Signed   By: Abelardo Diesel M.D.   On: 05/16/2013 16:36   Dg Abd Portable 1v  05/17/2013   CLINICAL DATA:  Partial small bowel obstruction  EXAM: PORTABLE ABDOMEN - 1 VIEW  COMPARISON:  CT scan of the abdomen dated May 16, 2013.  FINDINGS: The knee orally administered contrast has traversed the small bowel and reached the colon. There is no evidence of small bowel distention. The colonic gas and contrast pattern is normal. There is contrast within the urinary bladder. The bony structures exhibit no acute abnormalities.  IMPRESSION: There is no evidence of a small bowel obstruction or ileus today. The colonic gas and contrast pattern is normal as well.   Electronically Signed   By: David  Martinique   On: 05/17/2013 08:04     Medications: Scheduled: . enoxaparin (LOVENOX) injection  40 mg Subcutaneous Daily  . loratadine  10 mg Oral Daily   And  . pseudoephedrine  120 mg Oral BID   Continuous: . sodium chloride 75 mL/hr at 05/18/13 0056    Assessment/Plan: Active Problems: 1. Small bowel obstruction- tolerating liquids with diarrhea stools yesterday but no BM today and paucity of bowel sounds today on exam.  KUB shows  nonobstructive pattern but also did not show obstruction on admission.  Will monitor on Clear Liquids today and advance to full liquids tomorrow if tolerating.   2. Hypertension- anti-HTN medications on hold.   3. Allergic Rhinitis- discontinue sudafed and continue loratadine. 4. Hypokalemia- replete orally to keep K ~4.0 5. Disposition- possible discharge in 1-2 days if able to advance diet.   LOS: 2 days   Jeremiah Curci,W DOUGLAS 05/18/2013, 9:25 AM

## 2013-05-19 DIAGNOSIS — C549 Malignant neoplasm of corpus uteri, unspecified: Secondary | ICD-10-CM | POA: Diagnosis not present

## 2013-05-19 DIAGNOSIS — R0602 Shortness of breath: Secondary | ICD-10-CM | POA: Diagnosis not present

## 2013-05-19 DIAGNOSIS — K56609 Unspecified intestinal obstruction, unspecified as to partial versus complete obstruction: Secondary | ICD-10-CM | POA: Diagnosis not present

## 2013-05-19 LAB — CBC
HCT: 33.7 % — ABNORMAL LOW (ref 36.0–46.0)
HEMOGLOBIN: 11.2 g/dL — AB (ref 12.0–15.0)
MCH: 30.4 pg (ref 26.0–34.0)
MCHC: 33.2 g/dL (ref 30.0–36.0)
MCV: 91.3 fL (ref 78.0–100.0)
Platelets: 189 10*3/uL (ref 150–400)
RBC: 3.69 MIL/uL — AB (ref 3.87–5.11)
RDW: 14.1 % (ref 11.5–15.5)
WBC: 2.5 10*3/uL — AB (ref 4.0–10.5)

## 2013-05-19 LAB — COMPREHENSIVE METABOLIC PANEL
ALT: 17 U/L (ref 0–35)
AST: 23 U/L (ref 0–37)
Albumin: 2.9 g/dL — ABNORMAL LOW (ref 3.5–5.2)
Alkaline Phosphatase: 60 U/L (ref 39–117)
BUN: 8 mg/dL (ref 6–23)
CALCIUM: 8.6 mg/dL (ref 8.4–10.5)
CHLORIDE: 109 meq/L (ref 96–112)
CO2: 23 meq/L (ref 19–32)
Creatinine, Ser: 0.88 mg/dL (ref 0.50–1.10)
GFR, EST AFRICAN AMERICAN: 74 mL/min — AB (ref >90)
GFR, EST NON AFRICAN AMERICAN: 64 mL/min — AB (ref >90)
GLUCOSE: 97 mg/dL (ref 70–99)
Potassium: 3.9 mEq/L (ref 3.7–5.3)
SODIUM: 143 meq/L (ref 137–147)
Total Bilirubin: 0.3 mg/dL (ref 0.3–1.2)
Total Protein: 5.7 g/dL — ABNORMAL LOW (ref 6.0–8.3)

## 2013-05-19 NOTE — Progress Notes (Signed)
Low fiber dietary instructions reviewed and given pt a copy.

## 2013-05-19 NOTE — Progress Notes (Signed)
Subjective: Has had 5 loose BMs today.  Abdomen feels less distended.  No abdominal pain. Hungry and ready to try to advance diet.    Objective: Vital signs in last 24 hours: Temp:  [98 F (36.7 C)-98.5 F (36.9 C)] 98.5 F (36.9 C) (01/25 0639) Pulse Rate:  [77-90] 77 (01/25 0639) Resp:  [18] 18 (01/25 0639) BP: (126-140)/(58-61) 126/58 mmHg (01/25 0639) SpO2:  [97 %-99 %] 97 % (01/25 0639) Weight:  [74.299 kg (163 lb 12.8 oz)] 74.299 kg (163 lb 12.8 oz) (01/25 0639) Weight change: 0.589 kg (1 lb 4.8 oz) Last BM Date: 06/11/2013  CBG (last 3)  No results found for this basename: GLUCAP,  in the last 72 hours  Intake/Output from previous day: 06/11/2022 0701 - 01/25 0700 In: 2268.8 [P.O.:1380; I.V.:888.8] Out: -  Intake/Output this shift: Total I/O In: 360 [P.O.:360] Out: -   General appearance: alert and no distress Eyes: no scleral icterus Throat: oropharynx moist without erythema Resp: clear to auscultation bilaterally Cardio: regular rate and rhythm GI: less distended, non-tender; bowel sounds throughout; no tenderness Extremities: no clubbing, cyanosis or edema   Lab Results:  Recent Labs  June 11, 2013 0500 05/19/13 0655  NA 141 143  K 3.5* 3.9  CL 106 109  CO2 24 23  GLUCOSE 100* 97  BUN 10 8  CREATININE 0.82 0.88  CALCIUM 8.4 8.6    Recent Labs  11-Jun-2013 0500 05/19/13 0655  AST 21 23  ALT 17 17  ALKPHOS 61 60  BILITOT 0.3 0.3  PROT 5.6* 5.7*  ALBUMIN 2.9* 2.9*    Recent Labs  05/16/13 1557  Jun 11, 2013 0500 05/19/13 0655  WBC 10.8*  < > 4.7 2.5*  NEUTROABS 8.6*  --   --   --   HGB 14.1  < > 11.2* 11.2*  HCT 42.5  < > 34.2* 33.7*  MCV 92.4  < > 90.7 91.3  PLT 270  < > 211 189  < > = values in this interval not displayed. No results found for this basename: INR, PROTIME    Studies/Results: Dg Abd 2 Views  2013-06-11   CLINICAL DATA:  Small bowel obstruction.  EXAM: ABDOMEN - 2 VIEW  COMPARISON:  Abdominal radiograph 05/17/2013.  FINDINGS: Gas  and oral contrast material is again noted throughout the colon. Rectal gas is noted. No pathologic distention of small bowel. No gross pneumoperitoneum. Previously noted contrast in the urinary bladder is no longer present.  IMPRESSION: 1. Nonobstructive bowel gas pattern. 2. No pneumoperitoneum.   Electronically Signed   By: Vinnie Langton M.D.   On: 06/11/2013 08:57     Medications: Scheduled: . enoxaparin (LOVENOX) injection  40 mg Subcutaneous Daily  . loratadine  10 mg Oral Daily   Continuous: . sodium chloride 75 mL/hr at June 11, 2013 1651    Assessment/Plan: Active Problems: 1. Small bowel obstruction-  Improving with multiple BMs over past 24 hours.  Will advance diet today to full liquids-->low residue/low fiber diet this evening if tolerating well.   2. Essential hypertension, benign- BP controlled off of medications 3. Hypokalemia- improved with repletion. 4. Leukopenia- ?dilutional.  Recheck in am. 5. Disposition- anticipate discharge tomorrow if tolerating diet.   LOS: 3 days   [name],W DOUGLAS 05/19/2013, 11:15 AM

## 2013-05-20 DIAGNOSIS — C549 Malignant neoplasm of corpus uteri, unspecified: Secondary | ICD-10-CM | POA: Diagnosis not present

## 2013-05-20 DIAGNOSIS — R0602 Shortness of breath: Secondary | ICD-10-CM | POA: Diagnosis not present

## 2013-05-20 DIAGNOSIS — K56609 Unspecified intestinal obstruction, unspecified as to partial versus complete obstruction: Secondary | ICD-10-CM | POA: Diagnosis not present

## 2013-05-20 LAB — COMPREHENSIVE METABOLIC PANEL
ALK PHOS: 59 U/L (ref 39–117)
ALT: 21 U/L (ref 0–35)
AST: 25 U/L (ref 0–37)
Albumin: 2.9 g/dL — ABNORMAL LOW (ref 3.5–5.2)
BUN: 8 mg/dL (ref 6–23)
CO2: 23 mEq/L (ref 19–32)
Calcium: 8.6 mg/dL (ref 8.4–10.5)
Chloride: 107 mEq/L (ref 96–112)
Creatinine, Ser: 0.86 mg/dL (ref 0.50–1.10)
GFR calc Af Amer: 76 mL/min — ABNORMAL LOW (ref >90)
GFR calc non Af Amer: 65 mL/min — ABNORMAL LOW (ref >90)
GLUCOSE: 98 mg/dL (ref 70–99)
POTASSIUM: 3.8 meq/L (ref 3.7–5.3)
Sodium: 145 mEq/L (ref 137–147)
TOTAL PROTEIN: 5.6 g/dL — AB (ref 6.0–8.3)
Total Bilirubin: 0.3 mg/dL (ref 0.3–1.2)

## 2013-05-20 LAB — CBC
HCT: 31.6 % — ABNORMAL LOW (ref 36.0–46.0)
HEMOGLOBIN: 10.4 g/dL — AB (ref 12.0–15.0)
MCH: 30.1 pg (ref 26.0–34.0)
MCHC: 32.9 g/dL (ref 30.0–36.0)
MCV: 91.6 fL (ref 78.0–100.0)
Platelets: 220 10*3/uL (ref 150–400)
RBC: 3.45 MIL/uL — ABNORMAL LOW (ref 3.87–5.11)
RDW: 14.3 % (ref 11.5–15.5)
WBC: 2.7 10*3/uL — ABNORMAL LOW (ref 4.0–10.5)

## 2013-05-20 NOTE — Progress Notes (Signed)
Pt discharged per written orders. All discharge orders/instructions/follow up care/follow up appts/dietary recommendations discussed. Time allowed for questions and concerns. Pt states she has none at this time. Pt's sister-in-law at the bedside for discharge instructions and will transport pt home. IV removed. Pt dressed self and packed up own belongings. Pt denies pain or discomfort. Pt eating lunch and will leave once she has finished lunch. Pt denied my offer of a wheelchair for discharge.   -Soyla Dryer RN

## 2013-05-20 NOTE — Discharge Summary (Signed)
DISCHARGE SUMMARY  [name]  MR#: [id number]  DOB:1940/07/30  Date of Admission: 05/16/2013 Date of Discharge: 05/20/2013  Attending Physician:Juandedios Dudash W  Patient's HYI:FOYDXAJ,OINOMVE W, MD  Discharge Diagnoses: Active Problems:   Small bowel obstruction, probable gastroenteritis   Essential hypertension, benign  Endometrial CA, history, s/p XRT/Chemo/Surgery Hyperlipidemia Protein Calorie Malnutrition, due to poor PO intake  Discharge Medications:   Medication List         aspirin 325 MG tablet  Take 325 mg by mouth daily.     benazepril 20 MG tablet  Commonly known as:  LOTENSIN  Take 20 mg by mouth daily.     calcium carbonate 600 MG Tabs tablet  Commonly known as:  OS-CAL  Take 600 mg by mouth daily with breakfast.     cholecalciferol 1000 UNITS tablet  Commonly known as:  VITAMIN D  Take 1,000 Units by mouth daily.     diclofenac sodium 1 % Gel  Commonly known as:  VOLTAREN  Apply 2 g topically daily as needed (for pain).     docusate sodium 100 MG capsule  Commonly known as:  COLACE  Take 100 mg by mouth daily as needed for mild constipation.     fexofenadine 60 MG tablet  Commonly known as:  ALLEGRA  Take 60 mg by mouth daily as needed for allergies or rhinitis.     Fish Oil 1200 MG Caps  Take 1,200 mg by mouth daily.     Folic Acid-Vit H2-CNO B09 2.5-25-1 MG Tabs tablet  Commonly known as:  FOLBEE  Take 1 tablet by mouth daily.     ibuprofen 200 MG tablet  Commonly known as:  ADVIL,MOTRIN  Take 200 mg by mouth every 6 (six) hours as needed.     meclizine 25 MG tablet  Commonly known as:  ANTIVERT  Take 25 mg by mouth 4 (four) times daily as needed for dizziness or nausea.     multivitamin tablet  Take 1 tablet by mouth daily. Centrum silver     raloxifene 60 MG tablet  Commonly known as:  EVISTA  Take 60 mg by mouth daily.     simvastatin 40 MG tablet  Commonly known as:  ZOCOR  Take 40 mg by mouth every evening.     SYSTANE 0.4-0.3 % Soln  Generic drug:  Polyethyl Glycol-Propyl Glycol  Place 1 drop into both eyes daily as needed (for dry eyes).         May use Pepto Bismol as needed for loose BM's as was instructed prior.  She also has a prescription of Phenergan from last Wednesday to use PRN for nausea, should it recur.  Hospital Procedures: Ct Abdomen Pelvis W Contrast  05/16/2013   CLINICAL DATA:  Abdominal pain with nausea and vomiting. History of endometrial carcinoma.  EXAM: CT ABDOMEN AND PELVIS WITH CONTRAST  TECHNIQUE: Multidetector CT imaging of the abdomen and pelvis was performed using the standard protocol following bolus administration of intravenous contrast.  CONTRAST:  87mL OMNIPAQUE IOHEXOL 300 MG/ML SOLN, 151mL OMNIPAQUE IOHEXOL 300 MG/ML SOLN  COMPARISON:  CT scan dated 01/31/2013  FINDINGS: There are slightly distended loops of small bowel with a transition point in the pelvis consistent with partial small bowel obstruction. There is a second transition point in the left mid abdomen seen on image number 19 of series 6 consistent with a second area of partial small bowel obstruction.  There is ascites adjacent to the liver and in the pelvis, slightly increased since  the prior study. There is some fluid loculated at the superior aspect of the dome of the bladder.  The liver, spleen, pancreas, adrenal glands, and kidneys are normal except for a 6 mm cyst on the lower pole of the right kidney. No acute osseous abnormality. Incompletely healed fractures of the left inferior and superior pubic rami.  IMPRESSION: 1. 2 areas of what appear to be partial small bowel obstruction as described above. 2. Slight increase in abdominal ascites.   Electronically Signed   By: Rozetta Nunnery M.D.   On: 05/16/2013 20:30   Dg Abd 2 Views  05/18/2013   CLINICAL DATA:  Small bowel obstruction.  EXAM: ABDOMEN - 2 VIEW  COMPARISON:  Abdominal radiograph 05/17/2013.  FINDINGS: Gas and oral contrast material is again noted  throughout the colon. Rectal gas is noted. No pathologic distention of small bowel. No gross pneumoperitoneum. Previously noted contrast in the urinary bladder is no longer present.  IMPRESSION: 1. Nonobstructive bowel gas pattern. 2. No pneumoperitoneum.   Electronically Signed   By: Vinnie Langton M.D.   On: 05/18/2013 08:57   Dg Abd Acute W/chest  05/16/2013   CLINICAL DATA:  Abdomen pain, nausea, vomiting  EXAM: ACUTE ABDOMEN SERIES (ABDOMEN 2 VIEW & CHEST 1 VIEW)  COMPARISON:  February 02, 2013  FINDINGS: There is no evidence of dilated bowel loops or free intraperitoneal air. No radiopaque calculi or other significant radiographic abnormality is seen. Heart size and mediastinal contours are within normal limits. Both lungs are clear. There is scoliosis of spine. Chronic posttraumatic deformity of the left superior pubic rami is identified.  IMPRESSION: Negative abdominal radiographs.  No acute cardiopulmonary disease.   Electronically Signed   By: Abelardo Diesel M.D.   On: 05/16/2013 16:36   Dg Abd Portable 1v  05/17/2013   CLINICAL DATA:  Partial small bowel obstruction  EXAM: PORTABLE ABDOMEN - 1 VIEW  COMPARISON:  CT scan of the abdomen dated May 16, 2013.  FINDINGS: The knee orally administered contrast has traversed the small bowel and reached the colon. There is no evidence of small bowel distention. The colonic gas and contrast pattern is normal. There is contrast within the urinary bladder. The bony structures exhibit no acute abnormalities.  IMPRESSION: There is no evidence of a small bowel obstruction or ileus today. The colonic gas and contrast pattern is normal as well.   Electronically Signed   By: David  Martinique   On: 05/17/2013 08:04    History of Present Illness: Patient is a 73 year old female transferred post eval from [place] after partial SBO seen on CT  Hospital Course: [name] was kept NPO but did not require NG suction.  Has had loose BM's on and off  since she has been here, as was the case when seen as outpatient.  No issues with Nausea or Vomiting s/p Zofran.  Initially had pain, but has been pain free during her stay here.  Tolerated gradual refeeding over the weekend without worsening of nausea.  No surgical abdominal complaints noted.  She had been see 2 days prior to admission with suspected gastroenteritis after eating some out of date yogurt.  Feels better this AM and less bloating, tolerating breakfast without difficulty.  Day of Discharge Exam BP 146/64  Pulse 74  Temp(Src) 98.2 F (36.8 C) (Oral)  Resp 18  Ht 5\' 6"  (1.676 m)  Wt 73.576 kg (162 lb 3.3 oz)  BMI 26.19 kg/m2  SpO2 94%  Physical Exam: General appearance: alert, cooperative and appears stated age Eyes: no scleral icterus Throat: oropharynx moist without erythema Resp: clear to auscultation bilaterally Cardio: regular rate and rhythm, S1, S2 normal, no murmur, click, rub or gallop GI: soft, non-tender; bowel sounds normal; no masses,  no organomegaly  Minimal distention. Extremities: no clubbing, cyanosis or edema  Discharge Labs:  Recent Labs  05/19/13 0655 05/20/13 0420  NA 143 145  K 3.9 3.8  CL 109 107  CO2 23 23  GLUCOSE 97 98  BUN 8 8  CREATININE 0.88 0.86  CALCIUM 8.6 8.6    Recent Labs  05/19/13 0655 05/20/13 0420  AST 23 25  ALT 17 21  ALKPHOS 60 59  BILITOT 0.3 0.3  PROT 5.7* 5.6*  ALBUMIN 2.9* 2.9*    Recent Labs  05/19/13 0655 05/20/13 0420  WBC 2.5* 2.7*  HGB 11.2* 10.4*  HCT 33.7* 31.6*  MCV 91.3 91.6  PLT 189 220    Discharge instructions:      Future Appointments Provider Department Dept Phone   07/25/2013 9:30 AM Janie Morning, MD [facility] [phone]   10/28/2013 2:00 PM Blair Promise, MD [facility] [phone]      Disposition: Home  Follow-up Appts: Follow-up with Dr. Osborne Casco at [facility] in 1 week.  Call for  appointment.  May need sooner Oncology f/u due to trace ascites seen on CT, will arrange once seen for transition of care visit next week.  Condition on Discharge: Stable/improved  Signed: Brielynn Sekula W 05/20/2013, 8:06 AM

## 2013-06-19 DIAGNOSIS — Z6826 Body mass index (BMI) 26.0-26.9, adult: Secondary | ICD-10-CM | POA: Diagnosis not present

## 2013-06-19 DIAGNOSIS — K56609 Unspecified intestinal obstruction, unspecified as to partial versus complete obstruction: Secondary | ICD-10-CM | POA: Diagnosis not present

## 2013-06-19 DIAGNOSIS — R112 Nausea with vomiting, unspecified: Secondary | ICD-10-CM | POA: Diagnosis not present

## 2013-07-24 ENCOUNTER — Telehealth: Payer: Self-pay | Admitting: Gynecologic Oncology

## 2013-07-24 NOTE — Telephone Encounter (Signed)
Office Visit:  GYN ONCOLOGY  CHIEF COMPLAINT: Surveillance for  endometrial cancer.   ASSESSMENT/PLAN This is a 73 y.o.with stage IIIA grade 1endometrial cancer treated on GOG protocol 258.  He was randomized to the arm that received 6 cycles of Taxol carboplatin therapy. Recurrent disease was identified at a laparoscopic port site in the vagina in 2011.  She received additional Taxol carboplatin therapy and adjuvant external beam and vaginal brachytherapy. This treatment was completed in June 2012 and she's been without any evidence of disease since.  No evidence of recurrent disease on examination today.  Follow up with Dr. Sondra Come in 3 months F/U with Gyn Onc in 6 months  HISTORY OF PRESENT ILLNESS: This is a 73 y.o. , who underwent a robotic-assisted laparoscopic hysterectomy, bilateral salpingo- oophorectomy, lymph node dissection in July, 2010. Pathology was consistent with a stage III endometrial cancer with microscopic metastases to the adnexa. She was enrolled in GOG protocol 258 and was randomized to receive 6 cycles of Taxol and carboplatin. Posttreatment CT scan in January 2011 was WNl. At the end of 2011, on routine physical examination, she was found to have vaginal recurrence. CT imaging at that time confirmed the presence of an obturator mass at the pelvic port site in the left upper quadrant and enlarged obturator nodes. She  received 6 cycles of carboplatin and Taxol therapy. Imaging demonstrated residual left obturator and inguinal adenopathy and residual soft tissue mass and there was still residual disease in the vagina. She subsequently underwent external beam pelvic radiotherapy along with vaginal brachytherapy and treatment of the port sites. Treatment was completed in June, 2012.   PET 01/2013 Without evidence of metastatic disease.  Patient feels very well.  Pap January 2015 wnl   Past Medical History  Diagnosis Date  . Status post chemotherapy    carboplatin/paclitaxel x 6 rounds  . Hypercholesterolemia   . History of colonic polyps   . Bursitis of shoulder, left   . Cancer     Endometrial ca/Recurrence  . S/P radiation therapy July 29, 2010-Sep 06, 2010    External beam of pelvis  . S/P radiation therapy 09/15/10, 09/24/10, 09/28/2010    Intracavitary brachytherapy  . Lumbar pain   . Hypercholesteremia   . Allergy   . Hypertension   . SBO (small bowel obstruction)   Mammogram 12/2012 wnl Colonoscopy scheduled for this year   Past Surgical History  Procedure Laterality Date  . Abdominal hysterectomy    . Robotic assisted laparoscopic vaginal hysterectomy with fibroid removal  July 2010    bilat. salpingo-oophorectomy  . Breast lumpectomy  1993    Left breast, benign   SOCIAL HISTORY: The patient's daughter lives in [place]. The patient has good support in this area. Denies tobacco use. Daughter will visit at Christmas.    REVIEW OF SYSTEMS Constitutional  Feels well  Cardiovascular  No chest pain, shortness of breath, or edema  Pulmonary  No cough or wheeze.  Gastro Intestinal  No nausea, vomitting, or diarrhoea. No bright red blood per rectum, no abdominal pain, change in bowel movement, or constipation.  Genito Urinary  No frequency, urgency, dysuria, no vaginal bleeding or discharge Musculo Skeletal  No myalgia, arthralgia, joint swelling or pain sciatica better since spinal injection Neurologic  No weakness, numbness, change in gait,  Psychology  No depression, anxiety, insomnia.     PHYSICAL EXAMINATION:  WD female in NAD VITAL SIGNS: BP 147/65  Pulse 106  Temp(Src) 97.9 F (36.6 C) (Oral)  Resp 16  Ht 5\' 5"  (1.651 m)  Wt 161 lb 11.2 oz (73.347 kg)  BMI 26.91 kg/m2 CHEST: Clear to auscultation.  HEART: Regular rate and rhythm.  ABDOMEN: Soft, nontender, obese. No palpable lesions at the port sites. No evidence of hernia. No masses. No omental cake. No fluid wave.  BACK  No CVAT LN.  No axillary,  cervical or supraclavicular adenopathy PELVIC: Normal external genitalia. Her vagina is atrophic. No lesions visualized or palpated. No cul-de-sac masses or rectovaginal septum nodularity.  Rectal: Good anal sphincter without any masses. EXT 1-2+ lower extremity edema bilaterally

## 2013-07-25 ENCOUNTER — Encounter: Payer: Self-pay | Admitting: Gynecologic Oncology

## 2013-07-25 ENCOUNTER — Ambulatory Visit: Payer: Medicare Other | Attending: Gynecologic Oncology | Admitting: Gynecologic Oncology

## 2013-07-25 VITALS — BP 145/69 | HR 87 | Temp 98.3°F | Resp 16 | Wt 158.9 lb

## 2013-07-25 DIAGNOSIS — E7800 Pure hypercholesterolemia, unspecified: Secondary | ICD-10-CM | POA: Insufficient documentation

## 2013-07-25 DIAGNOSIS — Z923 Personal history of irradiation: Secondary | ICD-10-CM | POA: Insufficient documentation

## 2013-07-25 DIAGNOSIS — I10 Essential (primary) hypertension: Secondary | ICD-10-CM | POA: Insufficient documentation

## 2013-07-25 DIAGNOSIS — C549 Malignant neoplasm of corpus uteri, unspecified: Secondary | ICD-10-CM | POA: Diagnosis not present

## 2013-07-25 DIAGNOSIS — C541 Malignant neoplasm of endometrium: Secondary | ICD-10-CM

## 2013-07-25 DIAGNOSIS — E669 Obesity, unspecified: Secondary | ICD-10-CM | POA: Insufficient documentation

## 2013-07-25 DIAGNOSIS — Z90710 Acquired absence of both cervix and uterus: Secondary | ICD-10-CM | POA: Diagnosis not present

## 2013-07-25 DIAGNOSIS — Z9221 Personal history of antineoplastic chemotherapy: Secondary | ICD-10-CM | POA: Insufficient documentation

## 2013-07-25 NOTE — Patient Instructions (Signed)
  No evidence of recurrent disease on examination today.  Follow up with Dr. Sondra Come in 3 months F/U with Gyn Onc in 6 months

## 2013-07-25 NOTE — Progress Notes (Signed)
Office Visit:  GYN ONCOLOGY  CHIEF COMPLAINT: Surveillance for  endometrial cancer.   ASSESSMENT/PLAN This is a 73 y.o.with stage IIIA grade 1endometrial cancer treated on GOG protocol 258.  He was randomized to the arm that received 6 cycles of Taxol carboplatin therapy. Recurrent disease was identified at a laparoscopic port site in the vagina in 2011.  She received additional Taxol carboplatin therapy and adjuvant external beam and vaginal brachytherapy. This treatment was completed in June 2012 and she's been without any evidence of disease since.  No evidence of recurrent disease on examination today.  Follow up with Dr. Sondra Come in 3 months F/U with Gyn Onc in 6 months  HISTORY OF PRESENT ILLNESS: This is a 73 y.o. , who underwent a robotic-assisted laparoscopic hysterectomy, bilateral salpingo- oophorectomy, lymph node dissection in July, 2010. Pathology was consistent with a stage III endometrial cancer with microscopic metastases to the adnexa. She was enrolled in GOG protocol 258 and was randomized to receive 6 cycles of Taxol and carboplatin. Posttreatment CT scan in January 2011 was WNl. At the end of 2011, on routine physical examination, she was found to have vaginal recurrence. CT imaging at that time confirmed the presence of an obturator mass at the pelvic port site in the left upper quadrant and enlarged obturator nodes. She  received 6 cycles of carboplatin and Taxol therapy. Imaging demonstrated residual left obturator and inguinal adenopathy and residual soft tissue mass and there was still residual disease in the vagina. She subsequently underwent external beam pelvic radiotherapy along with vaginal brachytherapy and treatment of the port sites. Treatment was completed in June, 2012.   Patient has had two episodes of SBO since pelvic radiotherapy  PET 01/2013 Without evidence of metastatic disease.  Patient feels very well.  Pap January 2015 wnl   Past Medical History   Diagnosis Date  . Status post chemotherapy     carboplatin/paclitaxel x 6 rounds  . Hypercholesterolemia   . History of colonic polyps   . Bursitis of shoulder, left   . Cancer     Endometrial ca/Recurrence  . S/P radiation therapy July 29, 2010-Sep 06, 2010    External beam of pelvis  . S/P radiation therapy 09/15/10, 09/24/10, 09/28/2010    Intracavitary brachytherapy  . Lumbar pain   . Hypercholesteremia   . Allergy   . Hypertension   . SBO (small bowel obstruction)   Mammogram 12/2012 wnl   Past Surgical History  Procedure Laterality Date  . Abdominal hysterectomy    . Robotic assisted laparoscopic vaginal hysterectomy with fibroid removal  July 2010    bilat. salpingo-oophorectomy  . Breast lumpectomy  1993    Left breast, benign   SOCIAL HISTORY: The patient's daughter lives in [place]. The patient has good support in this area. Denies tobacco use. Daughter will visit in 2 weeks  REVIEW OF SYSTEMS Constitutional  Feels well , no weight loss Cardiovascular  No chest pain, shortness of breath, or edema  Pulmonary  No cough or wheeze.  Gastro Intestinal  No nausea, vomitting, or diarrhoea. No bright red blood per rectum, no abdominal pain, change in bowel movement, or constipation. Has remained on a low residue diet since  Her SBO Genito Urinary  No frequency, urgency, dysuria, no vaginal bleeding or discharge Musculo Skeletal  No myalgia, arthralgia, joint swelling or pain  Neurologic  No weakness, numbness, change in gait,  Psychology  No depression, anxiety, insomnia.     PHYSICAL EXAMINATION:  WD female in  NAD VITAL SIGNS: BP 145/69  Pulse 87  Temp(Src) 98.3 F (36.8 C) (Oral)  Resp 16  Wt 158 lb 14.4 oz (72.077 kg) CHEST: Clear to auscultation.  HEART: Regular rate and rhythm.  ABDOMEN: Soft, nontender, obese. No palpable lesions at the port sites. No evidence of hernia. No masses. No omental cake. No fluid wave.  BACK  No CVAT LN.  No axillary,  cervical or supraclavicular adenopathy PELVIC: Normal external genitalia. Her vagina is very atrophic. No lesions visualized or palpated. No cul-de-sac masses or rectovaginal septum nodularity.  Rectal: Good anal sphincter without any masses. EXT 1-2+ lower extremity edema bilaterally

## 2013-08-28 DIAGNOSIS — M1990 Unspecified osteoarthritis, unspecified site: Secondary | ICD-10-CM | POA: Diagnosis not present

## 2013-08-28 DIAGNOSIS — J301 Allergic rhinitis due to pollen: Secondary | ICD-10-CM | POA: Diagnosis not present

## 2013-08-28 DIAGNOSIS — Z6826 Body mass index (BMI) 26.0-26.9, adult: Secondary | ICD-10-CM | POA: Diagnosis not present

## 2013-08-28 DIAGNOSIS — R7301 Impaired fasting glucose: Secondary | ICD-10-CM | POA: Diagnosis not present

## 2013-08-28 DIAGNOSIS — M810 Age-related osteoporosis without current pathological fracture: Secondary | ICD-10-CM | POA: Diagnosis not present

## 2013-08-28 DIAGNOSIS — C549 Malignant neoplasm of corpus uteri, unspecified: Secondary | ICD-10-CM | POA: Diagnosis not present

## 2013-08-28 DIAGNOSIS — I10 Essential (primary) hypertension: Secondary | ICD-10-CM | POA: Diagnosis not present

## 2013-08-28 DIAGNOSIS — E785 Hyperlipidemia, unspecified: Secondary | ICD-10-CM | POA: Diagnosis not present

## 2013-09-17 DIAGNOSIS — H2510 Age-related nuclear cataract, unspecified eye: Secondary | ICD-10-CM | POA: Diagnosis not present

## 2013-10-28 ENCOUNTER — Ambulatory Visit: Payer: Medicare Other | Admitting: Radiation Oncology

## 2013-10-31 DIAGNOSIS — D2360 Other benign neoplasm of skin of unspecified upper limb, including shoulder: Secondary | ICD-10-CM | POA: Diagnosis not present

## 2013-10-31 DIAGNOSIS — L821 Other seborrheic keratosis: Secondary | ICD-10-CM | POA: Diagnosis not present

## 2013-10-31 DIAGNOSIS — L578 Other skin changes due to chronic exposure to nonionizing radiation: Secondary | ICD-10-CM | POA: Diagnosis not present

## 2013-10-31 DIAGNOSIS — L570 Actinic keratosis: Secondary | ICD-10-CM | POA: Diagnosis not present

## 2013-10-31 DIAGNOSIS — D485 Neoplasm of uncertain behavior of skin: Secondary | ICD-10-CM | POA: Diagnosis not present

## 2013-11-14 ENCOUNTER — Ambulatory Visit
Admission: RE | Admit: 2013-11-14 | Discharge: 2013-11-14 | Disposition: A | Discharge status: Home / Self Care | Payer: Medicare Other | Source: Ambulatory Visit | Attending: Radiation Oncology | Admitting: Radiation Oncology

## 2013-11-14 ENCOUNTER — Other Ambulatory Visit (HOSPITAL_COMMUNITY)
Admission: RE | Admit: 2013-11-14 | Discharge: 2013-11-14 | Disposition: A | Discharge status: Home / Self Care | Payer: Medicare Other | Source: Ambulatory Visit | Attending: Radiation Oncology | Admitting: Radiation Oncology

## 2013-11-14 ENCOUNTER — Encounter: Payer: Self-pay | Admitting: Radiation Oncology

## 2013-11-14 VITALS — BP 138/76 | HR 76 | Temp 98.4°F | Ht 66.0 in | Wt 157.7 lb

## 2013-11-14 DIAGNOSIS — Z124 Encounter for screening for malignant neoplasm of cervix: Secondary | ICD-10-CM | POA: Diagnosis not present

## 2013-11-14 DIAGNOSIS — C549 Malignant neoplasm of corpus uteri, unspecified: Secondary | ICD-10-CM | POA: Diagnosis not present

## 2013-11-14 NOTE — Progress Notes (Signed)
[name] here for follow up after treatment for endometrial cancer.  She denies pain.  She had a small bowel obstruction in January.  She reports having regular bowel movements now.  She reports having upper abdominal pain with 3 episodes of vomiting on Thursday.  She thinks she overate.  She denies bladder issues, fatigue and vaginal/rectal bleeding.  She reports having a skin biopsy on her right shoulder that were benign.

## 2013-11-14 NOTE — Progress Notes (Signed)
Radiation Oncology         [phone]) [phone] ________________________________  Name: [name] MRN: [id number]  Date: 11/14/2013  DOB: 13-Mar-1941  Follow-Up Visit Note  CC: Haywood Pao, MD  Sherol Dade., MD  Diagnosis:   Recurrent endometrial cancer  Interval Since Last Radiation:  3 years and one month, she completed external beam radiation therapy and intracavitary brachytherapy treatments  Narrative:  The patient returns today for routine follow-up.     Patient has had no further episodes of bowel obstruction since January. This was managed conservatively.  Last week she has episode of nausea and emesis x3 but no further problems. This appears to be related to food consumption while at the beach. She denies any abdominal pain,  pelvic pain vaginal bleeding, urination difficulties or rectal bleeding.                        ALLERGIES:  is allergic to phenobarbital.  Meds: Current Outpatient Prescriptions  Medication Sig Dispense Refill  . aspirin 325 MG tablet Take 325 mg by mouth daily.      . benazepril (LOTENSIN) 20 MG tablet Take 20 mg by mouth daily.      . calcium carbonate (OS-CAL) 600 MG TABS tablet Take 600 mg by mouth daily with breakfast.      . cetirizine (ZYRTEC) 10 MG tablet Take 10 mg by mouth daily.      . cholecalciferol (VITAMIN D) 1000 UNITS tablet Take 1,000 Units by mouth daily.      . Folic Acid-Vit D7-OEU M35 (FOLBEE) 2.5-25-1 MG TABS Take 1 tablet by mouth daily.      Marland Kitchen ibuprofen (ADVIL,MOTRIN) 200 MG tablet Take 200 mg by mouth every 6 (six) hours as needed.      . meclizine (ANTIVERT) 25 MG tablet Take 25 mg by mouth 4 (four) times daily as needed for dizziness or nausea.      . Multiple Vitamin (MULTIVITAMIN) tablet Take 1 tablet by mouth daily. Centrum silver      . Omega-3 Fatty Acids (FISH OIL) 1200 MG CAPS Take 1,200 mg by mouth daily.      Vladimir Faster Glycol-Propyl Glycol (SYSTANE) 0.4-0.3 % SOLN Place 1 drop into both eyes daily as needed  (for dry eyes).      . raloxifene (EVISTA) 60 MG tablet Take 60 mg by mouth daily.      . simvastatin (ZOCOR) 40 MG tablet Take 40 mg by mouth every evening.      . diclofenac sodium (VOLTAREN) 1 % GEL Apply 2 g topically daily as needed (for pain).      Marland Kitchen docusate sodium (COLACE) 100 MG capsule Take 100 mg by mouth daily as needed for mild constipation.      . fexofenadine (ALLEGRA) 60 MG tablet Take 60 mg by mouth daily as needed for allergies or rhinitis.       No current facility-administered medications for this encounter.    Physical Findings: The patient is in no acute distress. Patient is alert and oriented.  height is 5\' 6"  (1.676 m) and weight is 157 lb 11.2 oz (71.532 kg). Her oral temperature is 98.4 F (36.9 C). Her blood pressure is 138/76 and her pulse is 76. Marland Kitchen  No palpable supraclavicular or axillary adenopathy. The lungs are clear to auscultation. The heart has regular rhythm and rate. The abdomen is soft and nontender with normal bowel sounds area of the inguinal areas are free of adenopathy.  Patient has hyperpigmentation changes along the area of treatment for her cutaneous metastasis (laparoscopic port site) On  pelvic examination the external genitalia are unremarkable. A speculum exam is performed. No mucosal lesions are noted. A Pap smear was obtained of the proximal vagina. On bimanual and rectovaginal examination there no pelvic masses appreciated.  Lab Findings: Lab Results  Component Value Date   WBC 2.7* 05/20/2013   HGB 10.4* 05/20/2013   HCT 31.6* 05/20/2013   MCV 91.6 05/20/2013   PLT 220 05/20/2013      Radiographic Findings: No results found.  Impression:  No evidence of recurrence on clinical exam today, Pap smear pending  Plan:  Routine followup in 6 months. In the interim the patient will be seen by gynecologic oncology.  ____________________________________ Blair Promise, MD

## 2013-11-15 LAB — CYTOLOGY - PAP

## 2013-11-26 ENCOUNTER — Telehealth: Payer: Self-pay | Admitting: *Deleted

## 2013-11-26 NOTE — Telephone Encounter (Signed)
Notified [name] of scheduled appointment with Dr. Denman George on 12/02/2013 @ 10:45. [name] agreed with appt date and time

## 2013-12-02 ENCOUNTER — Ambulatory Visit: Payer: Medicare Other | Attending: Gynecologic Oncology | Admitting: Gynecologic Oncology

## 2013-12-02 ENCOUNTER — Encounter: Payer: Self-pay | Admitting: Gynecologic Oncology

## 2013-12-02 VITALS — BP 136/64 | HR 97 | Temp 98.5°F | Resp 18 | Ht 66.0 in | Wt 156.0 lb

## 2013-12-02 DIAGNOSIS — Z90710 Acquired absence of both cervix and uterus: Secondary | ICD-10-CM | POA: Diagnosis not present

## 2013-12-02 DIAGNOSIS — Z923 Personal history of irradiation: Secondary | ICD-10-CM | POA: Insufficient documentation

## 2013-12-02 DIAGNOSIS — Z9079 Acquired absence of other genital organ(s): Secondary | ICD-10-CM | POA: Insufficient documentation

## 2013-12-02 DIAGNOSIS — R87629 Unspecified abnormal cytological findings in specimens from vagina: Secondary | ICD-10-CM | POA: Diagnosis not present

## 2013-12-02 DIAGNOSIS — Z9221 Personal history of antineoplastic chemotherapy: Secondary | ICD-10-CM | POA: Insufficient documentation

## 2013-12-02 DIAGNOSIS — Z8542 Personal history of malignant neoplasm of other parts of uterus: Secondary | ICD-10-CM | POA: Insufficient documentation

## 2013-12-02 DIAGNOSIS — D281 Benign neoplasm of vagina: Secondary | ICD-10-CM | POA: Diagnosis not present

## 2013-12-02 NOTE — Patient Instructions (Signed)
We will contact you with the results of your biopsy from today.  Please call for any questions or concerns. 

## 2013-12-03 ENCOUNTER — Encounter: Payer: Self-pay | Admitting: Gynecologic Oncology

## 2013-12-03 NOTE — Progress Notes (Signed)
Office Visit:  GYN ONCOLOGY  CHIEF COMPLAINT: Surveillance for  endometrial cancer.   ASSESSMENT/PLAN This is a 73 y.o.with stage IIIA grade 1endometrial cancer treated on GOG protocol 258.  He was randomized to the arm that received 6 cycles of Taxol carboplatin therapy. Recurrent disease was identified at a laparoscopic port site, pelvic lymph nodes and in the vagina in 2011.  She received additional Taxol carboplatin therapy and adjuvant external beam and vaginal brachytherapy. This treatment was completed in June 2012 and she's been without any evidence of disease since.  Concerning pap (atypical glandular cells) and colpo findings for vaginal recurrence. Will await biopsy results. If insufficient tissue was captured, recommend taking the patient to the OR for more rigorous sampling. If benign, plan on followup with Dr Skeet Latch in approximately 2-3 months (October). If malignant, would consider PET to work this patient up for other sites of recurrence and treatment planning.   HISTORY OF PRESENT ILLNESS: This is a 73 y.o. , who underwent a robotic-assisted laparoscopic hysterectomy, bilateral salpingo- oophorectomy, lymph node dissection in July, 2010. Pathology was consistent with a stage III endometrial cancer with microscopic metastases to the adnexa. She was enrolled in GOG protocol 258 and was randomized to receive 6 cycles of Taxol and carboplatin. Posttreatment CT scan in January 2011 was WNl. At the end of 2011, on routine physical examination, she was found to have vaginal recurrence. CT imaging at that time confirmed the presence of an obturator mass at the pelvic port site in the left upper quadrant and enlarged obturator nodes. She  received 6 cycles of carboplatin and Taxol therapy. Imaging demonstrated residual left obturator and inguinal adenopathy and residual soft tissue mass and there was still residual disease in the vagina. She subsequently underwent external beam pelvic  radiotherapy along with vaginal brachytherapy and treatment of the port sites. Treatment was completed in June, 2012.   Patient has had two episodes of SBO since pelvic radiotherapy, most recently in January 2015. These were managed with conservative therapy.  PET 01/2013 (at the time of an SBO): without evidence of metastatic disease.  She saw Dr Sondra Come for routine surveillance in July 23rd, 2015. Pap at that visit showed atypical glandular cells with endometrioid phenotype. She is seen today to further workup the results of that pap with colposcopy and biopsy if indicated. Patient feels very well.   Past Medical History  Diagnosis Date  . Status post chemotherapy     carboplatin/paclitaxel x 6 rounds  . Hypercholesterolemia   . History of colonic polyps   . Bursitis of shoulder, left   . Cancer     Endometrial ca/Recurrence  . S/P radiation therapy July 29, 2010-Sep 06, 2010    External beam of pelvis  . S/P radiation therapy 09/15/10, 09/24/10, 09/28/2010    Intracavitary brachytherapy  . Lumbar pain   . Hypercholesteremia   . Allergy   . Hypertension   . SBO (small bowel obstruction)   Mammogram 12/2012 wnl   Past Surgical History  Procedure Laterality Date  . Abdominal hysterectomy    . Robotic assisted laparoscopic vaginal hysterectomy with fibroid removal  July 2010    bilat. salpingo-oophorectomy  . Breast lumpectomy  1993    Left breast, benign   SOCIAL HISTORY: The patient's daughter lives in [place]. The patient has good support in this area. Denies tobacco use. Daughter will visit in 2 weeks  REVIEW OF SYSTEMS Constitutional  Feels well , no weight loss Cardiovascular  No chest pain,  shortness of breath, or edema  Pulmonary  No cough or wheeze.  Gastro Intestinal  No nausea, vomitting, or diarrhoea. No bright red blood per rectum, no abdominal pain, change in bowel movement, or constipation. Has remained on a low residue diet since  Her SBO Genito Urinary   No frequency, urgency, dysuria, no vaginal bleeding or discharge Musculo Skeletal  No myalgia, arthralgia, joint swelling or pain  Neurologic  No weakness, numbness, change in gait,  Psychology  No depression, anxiety, insomnia.     PHYSICAL EXAMINATION:  WD female in NAD VITAL SIGNS: BP 136/64  Pulse 97  Temp(Src) 98.5 F (36.9 C) (Oral)  Resp 18  Ht 5\' 6"  (1.676 m)  Wt 156 lb (70.761 kg)  BMI 25.19 kg/m2 CHEST: Clear to auscultation.  HEART: Regular rate and rhythm.  ABDOMEN: Soft, nontender, obese. No palpable lesions at the port sites. No evidence of hernia. No masses. No omental cake. No fluid wave.  BACK  No CVAT LN.  No axillary, cervical or supraclavicular adenopathy PELVIC: Normal external genitalia. Her vagina is very atrophic. Colpo: magnification of the vagina with a colposcope was performed. 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 At this site there were atypical vascular changes, but no discrete papular or friable mass. An attempt at biopsy was made of the right vaginal cuff, though purchase on the vaginal epithelium was limited due to the atrophic nature of the tissue.  On bimanual palpation the vaginal mucosa is smooth without masses. There are no underlying lesions or nodules. Rectovaginal exam is confirmatory. EXT 1-2+ lower extremity edema bilaterally   Donaciano Eva, MD

## 2013-12-05 ENCOUNTER — Telehealth: Payer: Self-pay | Admitting: *Deleted

## 2013-12-05 NOTE — Telephone Encounter (Signed)
Per Dr. Denman George notified pt of results -biopsy of the vagina is benign.  Our plan is for her to see Dr Skeet Latch in 2 months as scheduled. Pt verbalized understanding no further concerns.

## 2013-12-11 ENCOUNTER — Telehealth: Payer: Self-pay | Admitting: Gynecologic Oncology

## 2013-12-11 NOTE — Telephone Encounter (Addendum)
Message left for patient about biopsy results.  Advised to follow up as scheduled and to call for any questions or concerns.  Message copied by Joylene John D on Wed Dec 11, 2013 10:44 AM ------      Message from: Everitt Amber C      Created: Wed Dec 04, 2013  3:04 PM       Would you mind letting [name] know that her biopsy of the vagina is benign.       Our plan is for her to see Dr Skeet Latch in 2 months as scheduled.      Donaciano Eva, MD

## 2013-12-11 NOTE — Telephone Encounter (Signed)
Patient returned call to office.  Notified of biopsy results and to follow up as scheduled.

## 2014-01-10 DIAGNOSIS — Z1231 Encounter for screening mammogram for malignant neoplasm of breast: Secondary | ICD-10-CM | POA: Diagnosis not present

## 2014-01-31 DIAGNOSIS — Z23 Encounter for immunization: Secondary | ICD-10-CM | POA: Diagnosis not present

## 2014-02-02 ENCOUNTER — Telehealth: Payer: Self-pay | Admitting: Gynecologic Oncology

## 2014-02-02 NOTE — Telephone Encounter (Signed)
Office Visit:  GYN ONCOLOGY  CHIEF COMPLAINT: Surveillance for  endometrial cancer.   ASSESSMENT/PLAN This is a 73 y.o.with stage IIIA grade 1endometrial cancer treated on GOG protocol 258.  He was randomized to the arm that received 6 cycles of Taxol carboplatin therapy. Recurrent disease was identified at a laparoscopic port site, pelvic lymph nodes and in the vagina in 2011.  She received additional Taxol carboplatin therapy and adjuvant external beam and vaginal brachytherapy. This treatment was completed in June 2012 and she's been without any evidence of disease since.  Concerning pap (atypical glandular cells) and colpo findings for vaginal recurrence. Will await biopsy results. If insufficient tissue was captured, recommend taking the patient to the OR for more rigorous sampling. If benign, plan on followup with Dr Skeet Latch in approximately 2-3 months (October). If malignant, would consider PET to work this patient up for other sites of recurrence and treatment planning.   HISTORY OF PRESENT ILLNESS: This is a 73 y.o. , who underwent a robotic-assisted laparoscopic hysterectomy, bilateral salpingo- oophorectomy, lymph node dissection in July, 2010. Pathology was consistent with a stage III endometrial cancer with microscopic metastases to the adnexa. She was enrolled in GOG protocol 258 and was randomized to receive 6 cycles of Taxol and carboplatin. Posttreatment CT scan in January 2011 was WNl. At the end of 2011, on routine physical examination, she was found to have vaginal recurrence. CT imaging at that time confirmed the presence of an obturator mass at the pelvic port site in the left upper quadrant and enlarged obturator nodes. She  received 6 cycles of carboplatin and Taxol therapy. Imaging demonstrated residual left obturator and inguinal adenopathy and residual soft tissue mass and there was still residual disease in the vagina. She subsequently underwent external beam pelvic  radiotherapy along with vaginal brachytherapy and treatment of the port sites. Treatment was completed in June, 2012.   Patient has had two episodes of SBO since pelvic radiotherapy, most recently in January 2015. These were managed with conservative therapy.  PET 01/2013 (at the time of an SBO): without evidence of metastatic disease.  She saw Dr Sondra Come for routine surveillance in July 23rd, 2015. Pap at that visit showed atypical glandular cells with endometrioid phenotype. She is seen today to further workup the results of that pap with colposcopy and biopsy if indicated. Patient feels very well.   Past Medical History  Diagnosis Date  . Status post chemotherapy     carboplatin/paclitaxel x 6 rounds  . Hypercholesterolemia   . History of colonic polyps   . Bursitis of shoulder, left   . Cancer     Endometrial ca/Recurrence  . S/P radiation therapy July 29, 2010-Sep 06, 2010    External beam of pelvis  . S/P radiation therapy 09/15/10, 09/24/10, 09/28/2010    Intracavitary brachytherapy  . Lumbar pain   . Hypercholesteremia   . Allergy   . Hypertension   . SBO (small bowel obstruction)   Mammogram 12/2012 wnl   Past Surgical History  Procedure Laterality Date  . Abdominal hysterectomy    . Robotic assisted laparoscopic vaginal hysterectomy with fibroid removal  July 2010    bilat. salpingo-oophorectomy  . Breast lumpectomy  1993    Left breast, benign   SOCIAL HISTORY: The patient's daughter lives in [place]. The patient has good support in this area. Denies tobacco use. Daughter will visit in 2 weeks  REVIEW OF SYSTEMS Constitutional  Feels well , no weight loss Cardiovascular  No chest pain,  shortness of breath, or edema  Pulmonary  No cough or wheeze.  Gastro Intestinal  No nausea, vomitting, or diarrhoea. No bright red blood per rectum, no abdominal pain, change in bowel movement, or constipation. Has remained on a low residue diet since  Her SBO Genito Urinary   No frequency, urgency, dysuria, no vaginal bleeding or discharge Musculo Skeletal  No myalgia, arthralgia, joint swelling or pain  Neurologic  No weakness, numbness, change in gait,  Psychology  No depression, anxiety, insomnia.     PHYSICAL EXAMINATION:  WD female in NAD VITAL SIGNS: BP 136/64  Pulse 97  Temp(Src) 98.5 F (36.9 C) (Oral)  Resp 18  Ht 5\' 6"  (1.676 m)  Wt 156 lb (70.761 kg)  BMI 25.19 kg/m2 CHEST: Clear to auscultation.  HEART: Regular rate and rhythm.  ABDOMEN: Soft, nontender, obese. No palpable lesions at the port sites. No evidence of hernia. No masses. No omental cake. No fluid wave.  BACK  No CVAT LN.  No axillary, cervical or supraclavicular adenopathy PELVIC: Normal external genitalia. Her vagina is very atrophic. Colpo: magnification of the vagina with a colposcope was performed. 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 At this site there were atypical vascular changes, but no discrete papular or friable mass. An attempt at biopsy was made of the right vaginal cuff, though purchase on the vaginal epithelium was limited due to the atrophic nature of the tissue.  On bimanual palpation the vaginal mucosa is smooth without masses. There are no underlying lesions or nodules. Rectovaginal exam is confirmatory. EXT 1-2+ lower extremity edema bilaterally   Janie Morning, MD

## 2014-02-03 ENCOUNTER — Ambulatory Visit: Payer: Medicare Other | Attending: Gynecologic Oncology | Admitting: Gynecologic Oncology

## 2014-02-03 VITALS — BP 146/64 | HR 83 | Temp 98.2°F | Resp 20 | Ht 66.0 in | Wt 161.1 lb

## 2014-02-03 DIAGNOSIS — I10 Essential (primary) hypertension: Secondary | ICD-10-CM | POA: Insufficient documentation

## 2014-02-03 DIAGNOSIS — Z8542 Personal history of malignant neoplasm of other parts of uterus: Secondary | ICD-10-CM | POA: Insufficient documentation

## 2014-02-03 DIAGNOSIS — Z9221 Personal history of antineoplastic chemotherapy: Secondary | ICD-10-CM | POA: Diagnosis not present

## 2014-02-03 DIAGNOSIS — E780 Pure hypercholesterolemia: Secondary | ICD-10-CM | POA: Insufficient documentation

## 2014-02-03 DIAGNOSIS — Z08 Encounter for follow-up examination after completed treatment for malignant neoplasm: Secondary | ICD-10-CM | POA: Insufficient documentation

## 2014-02-03 DIAGNOSIS — C541 Malignant neoplasm of endometrium: Secondary | ICD-10-CM

## 2014-02-03 DIAGNOSIS — Z90710 Acquired absence of both cervix and uterus: Secondary | ICD-10-CM | POA: Diagnosis not present

## 2014-02-03 DIAGNOSIS — Z923 Personal history of irradiation: Secondary | ICD-10-CM | POA: Insufficient documentation

## 2014-02-03 NOTE — Progress Notes (Signed)
Office Visit:  GYN ONCOLOGY  CHIEF COMPLAINT: Surveillance for  endometrial cancer.   ASSESSMENT/PLAN This is a 73 y.o.with stage IIIA grade 1endometrial cancer treated on GOG protocol 258.  He was randomized to the arm that received 6 cycles of Taxol carboplatin therapy. Recurrent disease was identified at a laparoscopic port site, pelvic lymph nodes and in the vagina in 2011.  She received additional Taxol carboplatin therapy and adjuvant external beam and vaginal brachytherapy. This treatment was completed in June 2012 and she's been without any evidence of disease since.  Follow-up in 6 months Pap in 07/2014   HISTORY OF PRESENT ILLNESS: This is a 73 y.o. , who underwent a robotic-assisted laparoscopic hysterectomy, bilateral salpingo- oophorectomy, lymph node dissection in July, 2010. Pathology was consistent with a stage III endometrial cancer with microscopic metastases to the adnexa. She was enrolled in GOG protocol 258 and was randomized to receive 6 cycles of Taxol and carboplatin. Posttreatment CT scan in January 2011 was WNl. At the end of 2011, on routine physical examination, she was found to have vaginal recurrence. CT imaging at that time confirmed the presence of an obturator mass at the pelvic port site in the left upper quadrant and enlarged obturator nodes. She  received 6 cycles of carboplatin and Taxol therapy. Imaging demonstrated residual left obturator and inguinal adenopathy and residual soft tissue mass and there was still residual disease in the vagina. She subsequently underwent external beam pelvic radiotherapy along with vaginal brachytherapy and treatment of the port sites. Treatment was completed in June, 2012.   Patient has had two episodes of SBO since pelvic radiotherapy, most recently in January 2015. These were managed with conservative therapy.  PET 01/2013 (at the time of an SBO): without evidence of metastatic disease.  She saw Dr Sondra Come for routine  surveillance in July 23rd, 2015. Pap at that visit showed atypical glandular cells with endometrioid phenotype. 11/2013 colposcopy and biopsy   Vagina, biopsy, right - SUPERFICIAL FRAGMENTS OF BENIGN SQUAMOUS EPITHELIUM. - NO MALIGNANCY IDENTIFIED, SEE COMMENT.  Patient feels very well.   Past Medical History  Diagnosis Date  . Status post chemotherapy     carboplatin/paclitaxel x 6 rounds  . Hypercholesterolemia   . History of colonic polyps   . Bursitis of shoulder, left   . Cancer     Endometrial ca/Recurrence  . S/P radiation therapy July 29, 2010-Sep 06, 2010    External beam of pelvis  . S/P radiation therapy 09/15/10, 09/24/10, 09/28/2010    Intracavitary brachytherapy  . Lumbar pain   . Hypercholesteremia   . Allergy   . Hypertension   . SBO (small bowel obstruction)   Mammogram 12/2013 wnl   Past Surgical History  Procedure Laterality Date  . Abdominal hysterectomy    . Robotic assisted laparoscopic vaginal hysterectomy with fibroid removal  July 2010    bilat. salpingo-oophorectomy  . Breast lumpectomy  1993    Left breast, benign   SOCIAL HISTORY: The patient's daughter lives in [place]. The patient has good support in this area. Denies tobacco use.    REVIEW OF SYSTEMS Constitutional  Feels well , no weight loss Cardiovascular  No chest pain, shortness of breath, or edema  Pulmonary  No cough or wheeze.  Gastro Intestinal  No nausea, vomitting, or diarrhoea. No bright red blood per rectum, no abdominal pain, change in bowel movement, or constipation. Has remained on a low residue diet since her SBO Genito Urinary  No frequency, urgency, dysuria, no  vaginal bleeding or discharge Musculo Skeletal  No myalgia, arthralgia, joint swelling or pain  Neurologic  No weakness, numbness, change in gait,  Psychology  No depression, anxiety, insomnia.     PHYSICAL EXAMINATION:  WD female in NAD VITAL SIGNS: BP 146/64  Pulse 83  Temp(Src) 98.2 F (36.8 C)  (Oral)  Resp 20  Ht 5\' 6"  (1.676 m)  Wt 161 lb 1.6 oz (73.074 kg)  BMI 26.01 kg/m2 CHEST: Clear to auscultation.  HEART: Regular rate and rhythm.  ABDOMEN: Soft, nontender, obese. No palpable lesions at the port sites. No evidence of hernia. No masses. No omental cake. No fluid wave.  BACK  No CVAT LN.  No axillary, cervical or supraclavicular adenopathy PELVIC: Normal external genitalia. Her vagina is very atrophic. With radiation changes.  No gross lesions, no cul de sac nodularity or pelvic masses Rectovaginal exam is confirmatory. EXT 1-2+ lower extremity edema bilaterally   Janie Morning, MD

## 2014-02-03 NOTE — Patient Instructions (Signed)
Follow-up in April 2016 Body mass index is 26.01 kg/(m^2). Wt Readings from Last 3 Encounters:  02/03/14 161 lb 1.6 oz (73.074 kg)  12/02/13 156 lb (70.761 kg)  11/14/13 157 lb 11.2 oz (71.532 kg)     Thank you very much Ms. Chae Vandenberg for allowing me to provide care for you today.  I appreciate your confidence in choosing our Gynecologic Oncology team.  If you have any questions about your visit today please call our office and we will get back to you as soon as possible.  Please consider using the website Medlineplus.gov as an Geneticist, molecular.   Francetta Found. Desean Heemstra MD., PhD Gynecologic Oncology

## 2014-03-05 DIAGNOSIS — E785 Hyperlipidemia, unspecified: Secondary | ICD-10-CM | POA: Diagnosis not present

## 2014-03-05 DIAGNOSIS — I10 Essential (primary) hypertension: Secondary | ICD-10-CM | POA: Diagnosis not present

## 2014-03-05 DIAGNOSIS — R7302 Impaired glucose tolerance (oral): Secondary | ICD-10-CM | POA: Diagnosis not present

## 2014-03-05 DIAGNOSIS — M810 Age-related osteoporosis without current pathological fracture: Secondary | ICD-10-CM | POA: Diagnosis not present

## 2014-03-12 DIAGNOSIS — E785 Hyperlipidemia, unspecified: Secondary | ICD-10-CM | POA: Diagnosis not present

## 2014-03-12 DIAGNOSIS — Z1212 Encounter for screening for malignant neoplasm of rectum: Secondary | ICD-10-CM | POA: Diagnosis not present

## 2014-03-12 DIAGNOSIS — M859 Disorder of bone density and structure, unspecified: Secondary | ICD-10-CM | POA: Diagnosis not present

## 2014-03-12 DIAGNOSIS — J302 Other seasonal allergic rhinitis: Secondary | ICD-10-CM | POA: Diagnosis not present

## 2014-03-12 DIAGNOSIS — Z1389 Encounter for screening for other disorder: Secondary | ICD-10-CM | POA: Diagnosis not present

## 2014-03-12 DIAGNOSIS — Z0000 Encounter for general adult medical examination without abnormal findings: Secondary | ICD-10-CM | POA: Diagnosis not present

## 2014-03-12 DIAGNOSIS — R809 Proteinuria, unspecified: Secondary | ICD-10-CM | POA: Diagnosis not present

## 2014-03-12 DIAGNOSIS — N183 Chronic kidney disease, stage 3 (moderate): Secondary | ICD-10-CM | POA: Diagnosis not present

## 2014-03-12 DIAGNOSIS — I10 Essential (primary) hypertension: Secondary | ICD-10-CM | POA: Diagnosis not present

## 2014-03-12 DIAGNOSIS — M810 Age-related osteoporosis without current pathological fracture: Secondary | ICD-10-CM | POA: Diagnosis not present

## 2014-03-12 DIAGNOSIS — C541 Malignant neoplasm of endometrium: Secondary | ICD-10-CM | POA: Diagnosis not present

## 2014-04-24 DIAGNOSIS — M810 Age-related osteoporosis without current pathological fracture: Secondary | ICD-10-CM | POA: Diagnosis not present

## 2014-04-24 DIAGNOSIS — I10 Essential (primary) hypertension: Secondary | ICD-10-CM | POA: Diagnosis not present

## 2014-04-24 DIAGNOSIS — N183 Chronic kidney disease, stage 3 (moderate): Secondary | ICD-10-CM | POA: Diagnosis not present

## 2014-04-24 DIAGNOSIS — Z6827 Body mass index (BMI) 27.0-27.9, adult: Secondary | ICD-10-CM | POA: Diagnosis not present

## 2014-05-01 ENCOUNTER — Ambulatory Visit
Admission: RE | Admit: 2014-05-01 | Discharge: 2014-05-01 | Disposition: A | Discharge status: Home / Self Care | Payer: Medicare Other | Source: Ambulatory Visit | Attending: Radiation Oncology | Admitting: Radiation Oncology

## 2014-05-01 ENCOUNTER — Encounter: Payer: Self-pay | Admitting: Radiation Oncology

## 2014-05-01 VITALS — BP 154/70 | HR 90 | Temp 98.7°F | Resp 16 | Ht 66.0 in | Wt 164.6 lb

## 2014-05-01 DIAGNOSIS — C549 Malignant neoplasm of corpus uteri, unspecified: Secondary | ICD-10-CM | POA: Diagnosis not present

## 2014-05-01 NOTE — Progress Notes (Signed)
Sherlyne Luft here for follow up.  She denies pain, nausea and bowel issues.  She reports urinary frequency/urgency due to drinking more water.  She denies any vaginal/rectal bleeding.  She is using a vaginal dilator.  She denies fatigue and is working full-time.

## 2014-05-01 NOTE — Progress Notes (Signed)
Radiation Oncology         [phone]) [phone] ________________________________  Name: [name] MRN: [id number]  Date: 05/01/2014  DOB: 1940/05/02  Follow-Up Visit Note  CC: Haywood Pao, MD  Tisovec, Fransico Him, MD    ICD-9-CM ICD-10-CM   1. Malignant neoplasm of corpus uteri, except isthmus 182.0 C54.9     Diagnosis:   Recurrent endometrial cancer  Interval Since Last Radiation:  3 years and 6 months    Narrative:  The patient returns today for routine follow-up.  Patient denies any further abdominal pain or nausea. She denies any or pelvic pain. She denies any hematuria or rectal bleeding. Patient was found to have an abnormal Pap smear last summer and proceeded to undergo colposcopy and biopsy in August. The patient will have a repeat Pap smear in April with her follow-up with Dr. Skeet Latch.                             ALLERGIES:  is allergic to phenobarbital.  Meds: Current Outpatient Prescriptions  Medication Sig Dispense Refill  . aspirin 325 MG tablet Take 325 mg by mouth daily.    Marland Kitchen atorvastatin (LIPITOR) 40 MG tablet   12  . benazepril (LOTENSIN) 20 MG tablet Take 20 mg by mouth daily.    . calcium carbonate (OS-CAL) 600 MG TABS tablet Take 600 mg by mouth daily with breakfast.    . cetirizine (ZYRTEC) 10 MG tablet Take 10 mg by mouth daily.    . cholecalciferol (VITAMIN D) 1000 UNITS tablet Take 1,000 Units by mouth daily.    Marland Kitchen denosumab (PROLIA) 60 MG/ML SOLN injection Inject 60 mg into the skin every 6 (six) months. Administer in upper arm, thigh, or abdomen    . diclofenac sodium (VOLTAREN) 1 % GEL Apply 2 g topically daily as needed (for pain).    Marland Kitchen docusate sodium (COLACE) 100 MG capsule Take 100 mg by mouth daily as needed for mild constipation.    . fexofenadine (ALLEGRA) 60 MG tablet Take 60 mg by mouth daily as needed for allergies or rhinitis.    . Folic Acid-Vit Q2-VZD G38 (FOLBEE) 2.5-25-1 MG TABS Take 1 tablet by mouth daily.    Marland Kitchen ibuprofen  (ADVIL,MOTRIN) 200 MG tablet Take 200 mg by mouth every 6 (six) hours as needed.    . loratadine (CLARITIN) 10 MG tablet Take 10 mg by mouth daily.    . meclizine (ANTIVERT) 25 MG tablet Take 25 mg by mouth 4 (four) times daily as needed for dizziness or nausea.    . Multiple Vitamin (MULTIVITAMIN) tablet Take 1 tablet by mouth daily. Centrum silver    . Omega-3 Fatty Acids (FISH OIL) 1200 MG CAPS Take 1,200 mg by mouth daily.    Vladimir Faster Glycol-Propyl Glycol (SYSTANE) 0.4-0.3 % SOLN Place 1 drop into both eyes daily as needed (for dry eyes).    . raloxifene (EVISTA) 60 MG tablet Take 60 mg by mouth daily.    . simvastatin (ZOCOR) 40 MG tablet Take 40 mg by mouth every evening.     No current facility-administered medications for this encounter.    Physical Findings: The patient is in no acute distress. Patient is alert and oriented.  height is 5\' 6"  (1.676 m) and weight is 164 lb 9.6 oz (74.662 kg). Her oral temperature is 98.7 F (37.1 C). Her blood pressure is 154/70 and her pulse is 90. Her respiration is 16. Marland Kitchen  No palpable supraclavicular or axillary adenopathy. The lungs are clear to auscultation. The heart has a regular rhythm and rate. The abdomen is soft and nontender with normal bowel sounds. Patient has hyperpigmentation changes from her previous radiation field directed at the port site recurrence. No inguinal adenopathy is appreciated. On pelvic examination the external genitalia are unremarkable. A speculum exam is performed. There are radiation changes noted in the vaginal vault but no mucosal lesions appreciated. On bimanual and rectovaginal examination there no pelvic masses appreciated.  Lab Findings: Lab Results  Component Value Date   WBC 2.7* 05/20/2013   HGB 10.4* 05/20/2013   HCT 31.6* 05/20/2013   MCV 91.6 05/20/2013   PLT 220 05/20/2013    Radiographic Findings: No results found.  Impression:  No evidence of recurrence on clinical exam today  Plan:  Routine  follow-up in 6 months. In the interim the patient will be seen by gynecologic oncology.  ____________________________________ Blair Promise, MD

## 2014-06-12 DIAGNOSIS — L219 Seborrheic dermatitis, unspecified: Secondary | ICD-10-CM | POA: Diagnosis not present

## 2014-08-14 ENCOUNTER — Ambulatory Visit: Payer: Medicare Other | Attending: Gynecologic Oncology | Admitting: Gynecologic Oncology

## 2014-08-14 ENCOUNTER — Encounter: Payer: Self-pay | Admitting: Gynecologic Oncology

## 2014-08-14 ENCOUNTER — Other Ambulatory Visit (HOSPITAL_COMMUNITY)
Admission: RE | Admit: 2014-08-14 | Discharge: 2014-08-14 | Disposition: A | Discharge status: Home / Self Care | Payer: Medicare Other | Source: Ambulatory Visit | Attending: Gynecologic Oncology | Admitting: Gynecologic Oncology

## 2014-08-14 VITALS — BP 153/76 | HR 93 | Temp 98.5°F | Resp 18 | Ht 66.0 in | Wt 167.2 lb

## 2014-08-14 DIAGNOSIS — Z90710 Acquired absence of both cervix and uterus: Secondary | ICD-10-CM | POA: Insufficient documentation

## 2014-08-14 DIAGNOSIS — Z01411 Encounter for gynecological examination (general) (routine) with abnormal findings: Secondary | ICD-10-CM | POA: Insufficient documentation

## 2014-08-14 DIAGNOSIS — C541 Malignant neoplasm of endometrium: Secondary | ICD-10-CM | POA: Diagnosis not present

## 2014-08-14 DIAGNOSIS — E780 Pure hypercholesterolemia: Secondary | ICD-10-CM | POA: Insufficient documentation

## 2014-08-14 DIAGNOSIS — Z923 Personal history of irradiation: Secondary | ICD-10-CM | POA: Insufficient documentation

## 2014-08-14 DIAGNOSIS — Z8542 Personal history of malignant neoplasm of other parts of uterus: Secondary | ICD-10-CM

## 2014-08-14 DIAGNOSIS — Z9221 Personal history of antineoplastic chemotherapy: Secondary | ICD-10-CM | POA: Diagnosis not present

## 2014-08-14 DIAGNOSIS — I10 Essential (primary) hypertension: Secondary | ICD-10-CM | POA: Insufficient documentation

## 2014-08-14 NOTE — Progress Notes (Signed)
Office Visit:  GYN ONCOLOGY  CHIEF COMPLAINT: Surveillance for  endometrial cancer.   ASSESSMENT/PLAN This is a 74 y.o.with stage IIIA grade 1endometrial cancer treated on GOG protocol 258.  He was randomized to the arm that received 6 cycles of Taxol carboplatin therapy. Recurrent disease was identified at a laparoscopic port site, pelvic lymph nodes and in the vagina in 2011.  She received additional Taxol carboplatin therapy and adjuvant external beam and vaginal brachytherapy. This treatment was completed in June 2012 and she's been without any evidence of disease since.  Follow-up in 6 months Will f/u with pap results   HISTORY OF PRESENT ILLNESS: This is a 74 y.o. , who underwent a robotic-assisted laparoscopic hysterectomy, bilateral salpingo- oophorectomy, lymph node dissection in July, 2010. Pathology was consistent with a stage III endometrial cancer with microscopic metastases to the adnexa. She was enrolled in GOG protocol 258 and was randomized to receive 6 cycles of Taxol and carboplatin. Posttreatment CT scan in January 2011 was WNl. At the end of 2011, on routine physical examination, she was found to have vaginal recurrence. CT imaging at that time confirmed the presence of an obturator mass at the pelvic port site in the left upper quadrant and enlarged obturator nodes. She  received 6 cycles of carboplatin and Taxol therapy. Imaging demonstrated residual left obturator and inguinal adenopathy and residual soft tissue mass and there was still residual disease in the vagina. She subsequently underwent external beam pelvic radiotherapy along with vaginal brachytherapy and treatment of the port sites. Treatment was completed in June, 2012.   Patient has had two episodes of SBO since pelvic radiotherapy, most recently in January 2015. These were managed with conservative therapy.  PET 01/2013 (at the time of an SBO): without evidence of metastatic disease.  She saw Dr Sondra Come for  routine surveillance in July 23rd, 2015. Pap at that visit showed atypical glandular cells with endometrioid phenotype. 11/2013 colposcopy and biopsy   Vagina, biopsy, right - SUPERFICIAL FRAGMENTS OF BENIGN SQUAMOUS EPITHELIUM. - NO MALIGNANCY IDENTIFIED, SEE COMMENT.  Patient feels very well.   Past Medical History  Diagnosis Date  . Status post chemotherapy     carboplatin/paclitaxel x 6 rounds  . Hypercholesterolemia   . History of colonic polyps   . Bursitis of shoulder, left   . Cancer     Endometrial ca/Recurrence  . S/P radiation therapy July 29, 2010-Sep 06, 2010    External beam of pelvis  . S/P radiation therapy 09/15/10, 09/24/10, 09/28/2010    Intracavitary brachytherapy  . Lumbar pain   . Hypercholesteremia   . Allergy   . Hypertension   . SBO (small bowel obstruction)   Mammogram 12/2013 wnl   Past Surgical History  Procedure Laterality Date  . Abdominal hysterectomy    . Robotic assisted laparoscopic vaginal hysterectomy with fibroid removal  July 2010    bilat. salpingo-oophorectomy  . Breast lumpectomy  1993    Left breast, benign   SOCIAL HISTORY: The patient's daughter lives in [place]. The patient has good support in this area. Denies tobacco use.    REVIEW OF SYSTEMS Constitutional  Feels well , no weight loss Cardiovascular  No chest pain, shortness of breath, or edema  Pulmonary  No cough or wheeze.  Gastro Intestinal  No nausea, vomitting, or diarrhoea. No bright red blood per rectum, no abdominal pain, change in bowel movement, or constipation. Has remained on a low residue diet since her SBO Genito Urinary  No frequency, urgency,  dysuria, no vaginal bleeding or discharge Musculo Skeletal  No myalgia, arthralgia, joint swelling or pain  Neurologic  No weakness, numbness, change in gait,  Psychology  No depression, anxiety, insomnia.     PHYSICAL EXAMINATION:  WD female in NAD VITAL SIGNS: BP 153/76 mmHg  Pulse 93  Temp(Src) 98.5 F  (36.9 C) (Oral)  Resp 18  Ht 5\' 6"  (1.676 m)  Wt 167 lb 3.2 oz (75.841 kg)  BMI 27.00 kg/m2 CHEST: Clear to auscultation.  HEART: Regular rate and rhythm.  ABDOMEN: Soft, nontender, obese. No palpable lesions at the port sites. No evidence of hernia. No masses. No omental cake. No fluid wave.  BACK  No CVAT LN.  No axillary, cervical or supraclavicular adenopathy PELVIC: Normal external genitalia. Her vagina is very atrophic. With radiation changes.  No gross lesions, no cul de sac nodularity or pelvic masses.  Pap collected Rectovaginal exam is confirmatory. EXT 1-2+ lower extremity edema bilaterally   Janie Morning, MD

## 2014-08-14 NOTE — Patient Instructions (Signed)
Plan to follow up in Oct 2016

## 2014-08-19 LAB — CYTOLOGY - PAP

## 2014-08-21 ENCOUNTER — Telehealth: Payer: Self-pay | Admitting: *Deleted

## 2014-08-21 NOTE — Telephone Encounter (Signed)
Left VM on patient's cell phone that her pap smear was normal per Joylene John, NP. Instructed patient to call back with any questions or concerns.

## 2014-08-25 DIAGNOSIS — H4323 Crystalline deposits in vitreous body, bilateral: Secondary | ICD-10-CM | POA: Diagnosis not present

## 2014-08-28 DIAGNOSIS — L820 Inflamed seborrheic keratosis: Secondary | ICD-10-CM | POA: Diagnosis not present

## 2014-08-28 DIAGNOSIS — D485 Neoplasm of uncertain behavior of skin: Secondary | ICD-10-CM | POA: Diagnosis not present

## 2014-09-10 DIAGNOSIS — I10 Essential (primary) hypertension: Secondary | ICD-10-CM | POA: Diagnosis not present

## 2014-09-10 DIAGNOSIS — M810 Age-related osteoporosis without current pathological fracture: Secondary | ICD-10-CM | POA: Diagnosis not present

## 2014-09-10 DIAGNOSIS — N183 Chronic kidney disease, stage 3 (moderate): Secondary | ICD-10-CM | POA: Diagnosis not present

## 2014-09-10 DIAGNOSIS — E785 Hyperlipidemia, unspecified: Secondary | ICD-10-CM | POA: Diagnosis not present

## 2014-09-10 DIAGNOSIS — R809 Proteinuria, unspecified: Secondary | ICD-10-CM | POA: Diagnosis not present

## 2014-09-10 DIAGNOSIS — R7302 Impaired glucose tolerance (oral): Secondary | ICD-10-CM | POA: Diagnosis not present

## 2014-09-10 DIAGNOSIS — Z6827 Body mass index (BMI) 27.0-27.9, adult: Secondary | ICD-10-CM | POA: Diagnosis not present

## 2014-09-10 DIAGNOSIS — Z1389 Encounter for screening for other disorder: Secondary | ICD-10-CM | POA: Diagnosis not present

## 2014-10-29 IMAGING — CT CT ABD-PELV W/O CM
2 of 4 series · 16 of 46 positions shown, 18 images · non-contrast
Comparison: CT of the abdomen and pelvis August 05, 2010

CLINICAL DATA: Left flank pain.  History of hysterectomy and
endometrial carcinoma.

CT ABDOMEN AND PELVIS WITHOUT CONTRAST
TECHNIQUE: Multidetector CT imaging of the abdomen and pelvis was
performed following the standard protocol without intravenous
contrast.

[Series 2: renal stone > 200 lbs 5.0 b31f · axial · 0.78mm/px · z∈[-558,-103]mm · 13 of 99 slices shown, 15 images]
[im 4/99  soft-tissue]
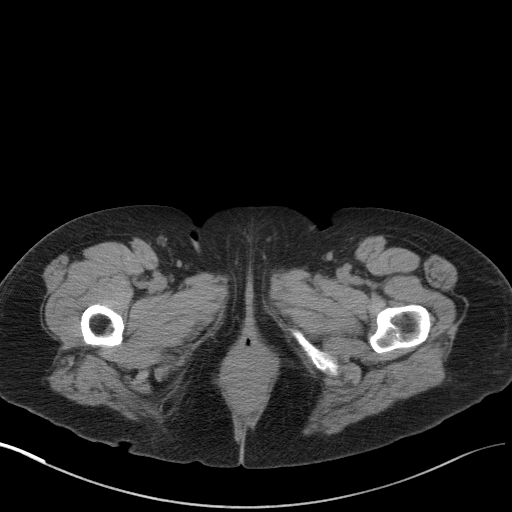
[im 4/99  bone]
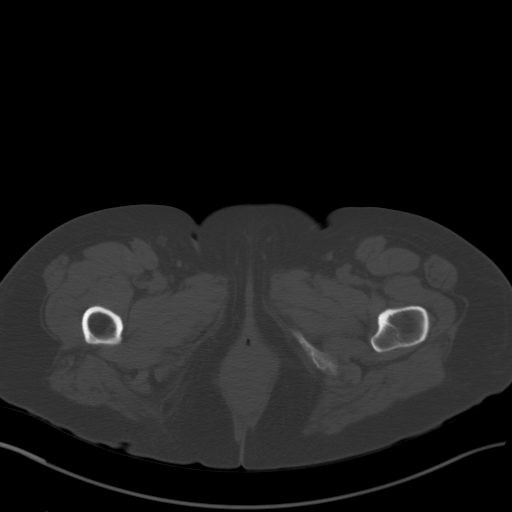
[im 12/99  soft-tissue]
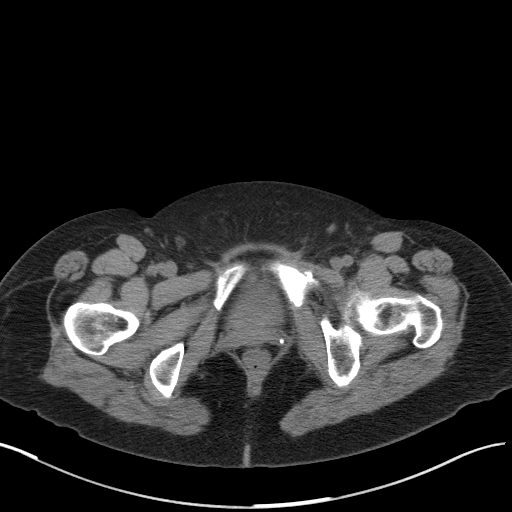
[im 20/99  soft-tissue]
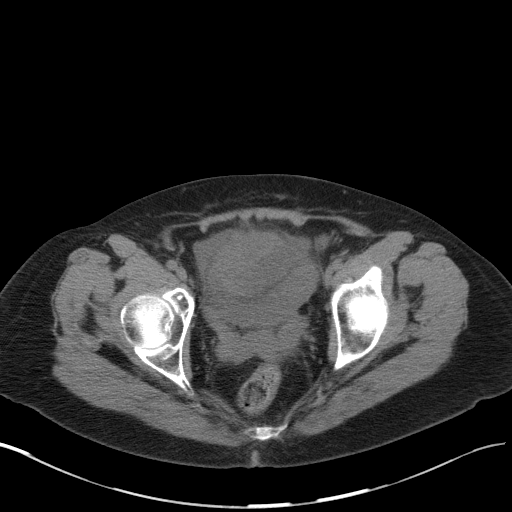
[im 28/99  soft-tissue]
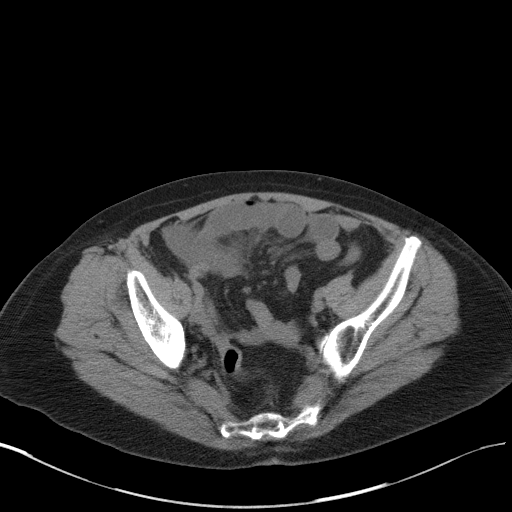
[im 36/99  soft-tissue]
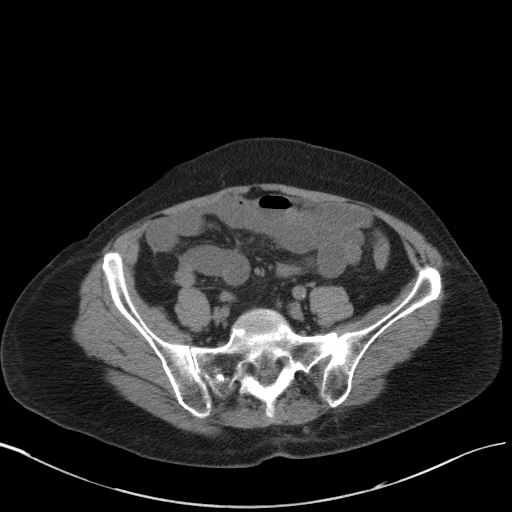
[im 44/99  soft-tissue]
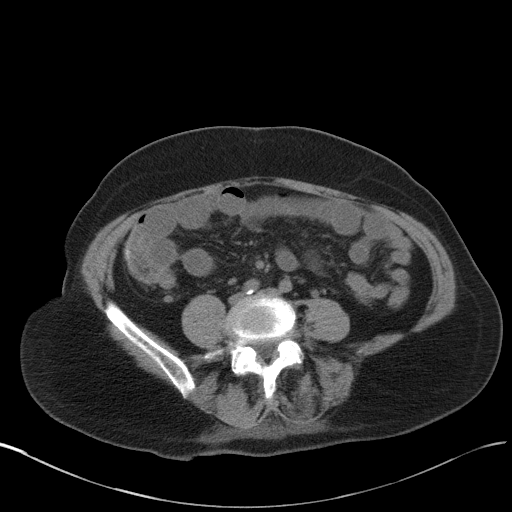
[im 51/99  soft-tissue]
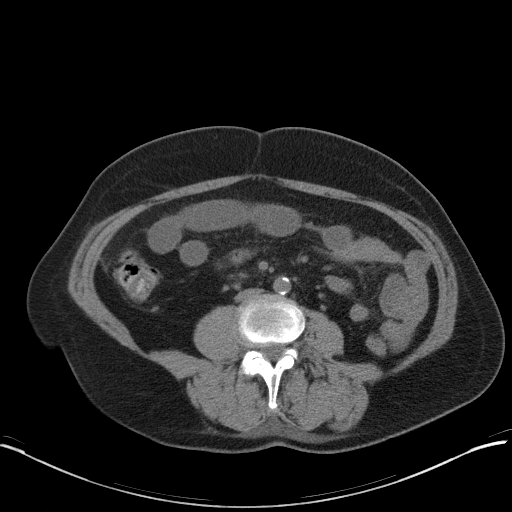
[im 55/99  soft-tissue]
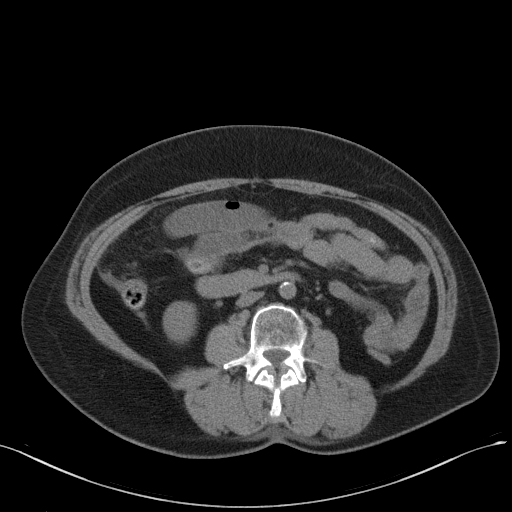
[im 63/99  soft-tissue]
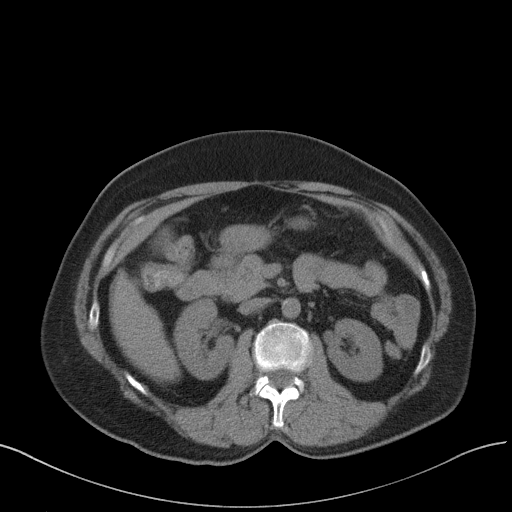
[im 63/99  bone]
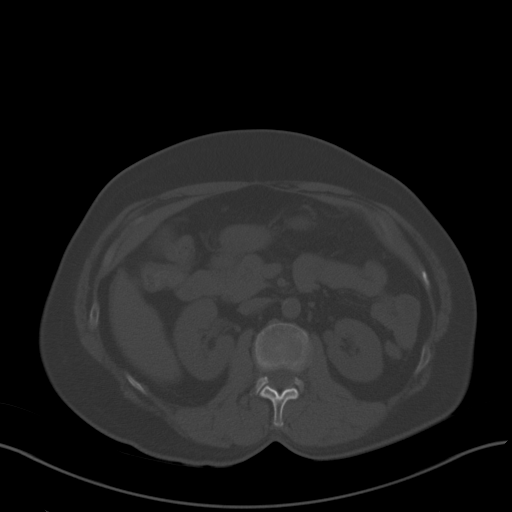
[im 71/99  soft-tissue]
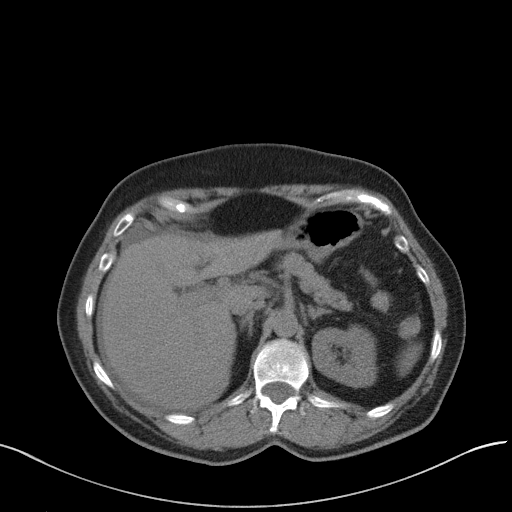
[im 79/99  soft-tissue]
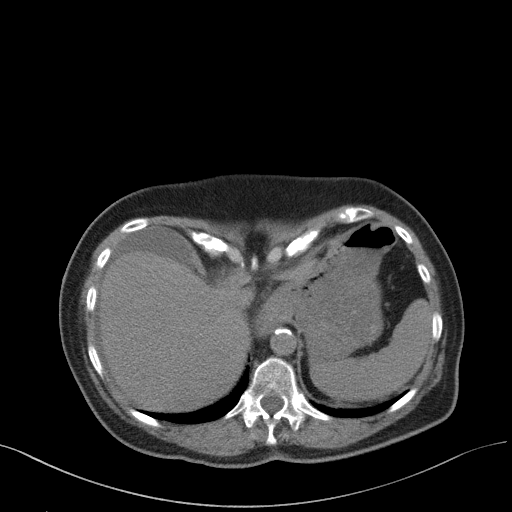
[im 87/99  soft-tissue]
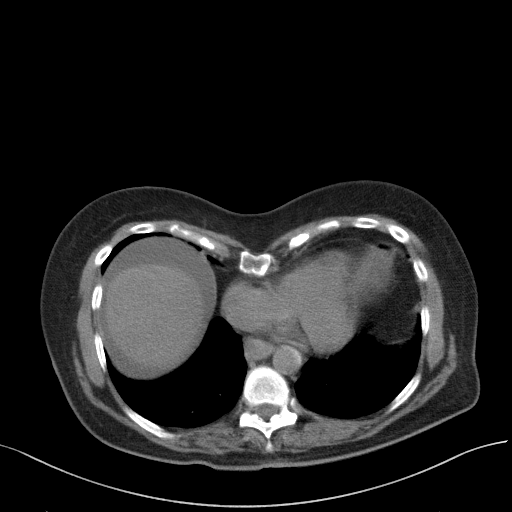
[im 95/99  soft-tissue]
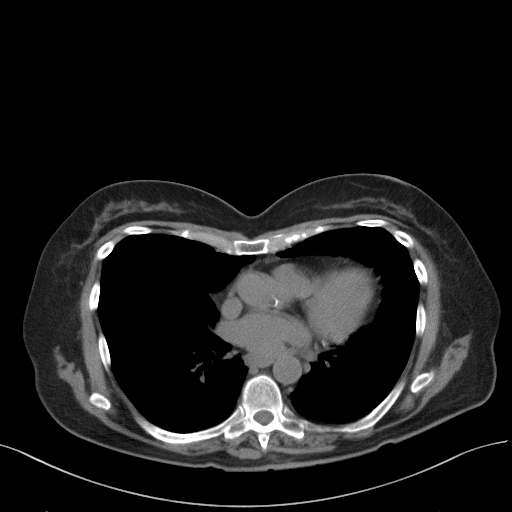

[Series 5: renal stone 3.0 coronal · coronal · 0.79mm/px · 3 of 87 slices shown]
[im 29/87  soft-tissue]
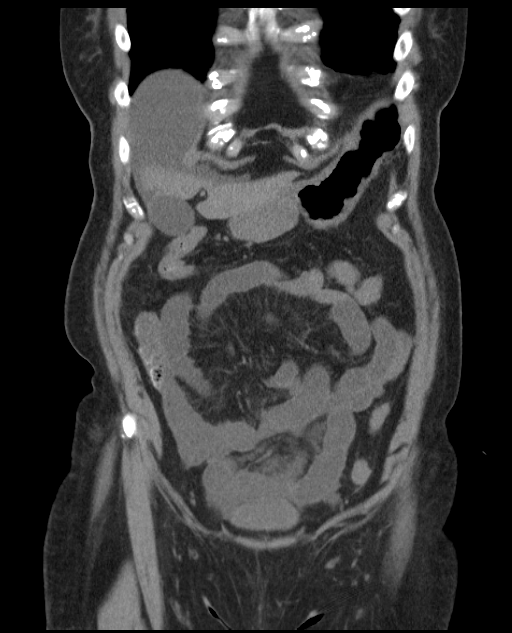
[im 39/87  soft-tissue]
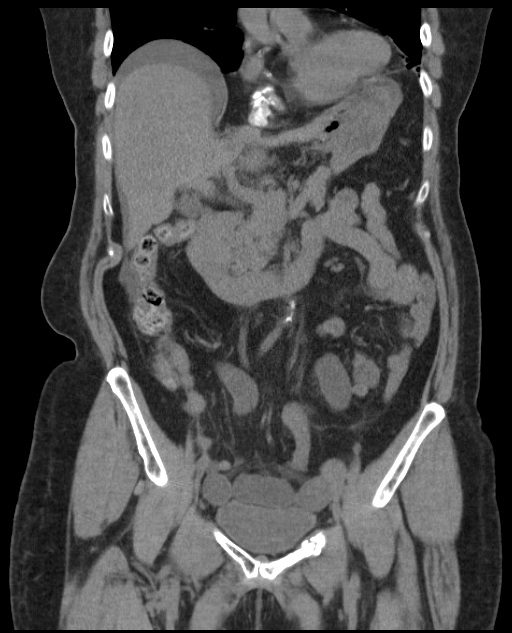
[im 48/87  soft-tissue]
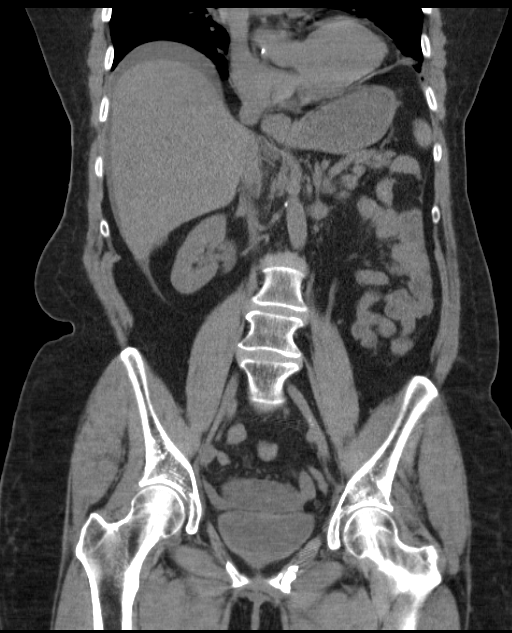

[16 of 46 positions shown; findings below may reference images not displayed]

FINDINGS: The limited view of the lung bases are clear. Mild heart
deformity due to pectus extra bottom.

The kidneys orthotopic demonstrate normal size, morphology.
Extrarenal pelvis on the right.  No nephrolithiasis or
hydronephrosis.  Limited assessment for renal mass on this
noncontrast examination.  The unopacified ureters are normal in
course and caliber.  Urinary bladder is partially distended, non
suspicious.

Small amount of predominate right upper quadrant ascites (11 HU).
There are multiple loops of mildly distended small bowel in the
central abdomen, measuring up to 2.5 cm.  Tiny calcification versus
surgical clip along the posterior wall of one of the loops of bowel
in the pelvis.  Large bowel is unremarkable, normal appendix.

The liver, spleen, pancreas, gallbladder and adrenal glands are not
suspicious for this noncontrast examination.  Moderate calcific
atherosclerosis of the aorta, the vessels are overall normal in
course and caliber.  The left anterior abdominal wall subcutaneous
nodule now is seen as scarring without discrete mass.

Fracture of the left pubic body extending to the inferior pubic
ramus, in addition to out a left superior pubic ramus fracture, the
underlying bone appears somewhat sclerotic and heterogeneous.
Patchy areas of sclerosis within the sacrum appears new
IMPRESSION: Mildly prominent fluid filled small bowel (2.5 cm) in
the pelvis without discrete transition point on this nonenhanced
examination, this could reflect early small bowel obstruction,
possibly from adhesions.  No bowel perforation.

New small amount of ascites in the right upper quadrant
predominately.

Ill-defined sclerotic bone mineral density in the left pubic bone,
sacrum, with apparent pathologic fracture of left superior and
inferior pubic rami.

Findings discussed with and reconfirmed by Dr. Fluker on January 31, 2013 at approximately 2532 hours.

## 2014-11-06 ENCOUNTER — Encounter: Payer: Self-pay | Admitting: Radiation Oncology

## 2014-11-06 ENCOUNTER — Ambulatory Visit
Admission: RE | Admit: 2014-11-06 | Discharge: 2014-11-06 | Disposition: A | Discharge status: Home / Self Care | Payer: Medicare Other | Source: Ambulatory Visit | Attending: Radiation Oncology | Admitting: Radiation Oncology

## 2014-11-06 VITALS — BP 152/65 | HR 96 | Temp 98.2°F | Resp 20 | Wt 166.8 lb

## 2014-11-06 DIAGNOSIS — C549 Malignant neoplasm of corpus uteri, unspecified: Secondary | ICD-10-CM | POA: Diagnosis not present

## 2014-11-06 NOTE — Progress Notes (Signed)
Radiation Oncology         [phone]) [phone] ________________________________  Name: [name] MRN: [id number]  Date: 11/06/2014  DOB: [birth year]  Follow-Up Visit Note  CC: Haywood Pao, MD  Nancy Marus, MD   Diagnosis:   Recurrent endometrial cancer  Interval Since Last Radiation:  July 2012 (4 years)    Narrative:  The patient returns today for routine follow-up. Pt denies bleeding, discharge, or nausea. Denies blood in her urine. She uses a dialator occasionally, but has not used in the past 2 weeks until this morning. Reports she has a good appetite with a regular diet. Uses miralax for constipation. She takes it about once or twice a week. Started back walking again. Energy levels good and she is still working. She had a Pap smear in April 2016 with Dr. Skeet Latch which was negative. No further problems with nausea or abdominal pain. She watches her diet closely to avoid high fiber foods.    ALLERGIES:  is allergic to phenobarbital.  Meds: Current Outpatient Prescriptions  Medication Sig Dispense Refill  . aspirin 325 MG tablet Take 325 mg by mouth daily.    . benazepril (LOTENSIN) 20 MG tablet Take 20 mg by mouth daily.    . calcium carbonate (OS-CAL) 600 MG TABS tablet Take 600 mg by mouth daily with breakfast.    . cetirizine (ZYRTEC) 10 MG tablet Take 10 mg by mouth daily.    . cholecalciferol (VITAMIN D) 1000 UNITS tablet Take 1,000 Units by mouth daily.    . clobetasol (TEMOVATE) 0.05 % external solution   2  . denosumab (PROLIA) 60 MG/ML SOLN injection Inject 60 mg into the skin every 6 (six) months. Administer in upper arm, thigh, or abdomen    . diclofenac sodium (VOLTAREN) 1 % GEL Apply 2 g topically daily as needed (for pain).    Marland Kitchen docusate sodium (COLACE) 100 MG capsule Take 100 mg by mouth daily as needed for mild constipation.    . Folic Acid-Vit V7-OHY W73 (FOLBEE) 2.5-25-1 MG TABS Take 1 tablet by mouth daily.    Marland Kitchen ibuprofen (ADVIL,MOTRIN) 200 MG tablet  Take 200 mg by mouth every 6 (six) hours as needed.    . loratadine (CLARITIN) 10 MG tablet Take 10 mg by mouth daily.    . meclizine (ANTIVERT) 25 MG tablet Take 25 mg by mouth 4 (four) times daily as needed for dizziness or nausea.    . Multiple Vitamin (MULTIVITAMIN) tablet Take 1 tablet by mouth daily. Centrum silver    . Omega-3 Fatty Acids (FISH OIL) 1200 MG CAPS Take 1,200 mg by mouth daily.    Vladimir Faster Glycol-Propyl Glycol (SYSTANE) 0.4-0.3 % SOLN Place 1 drop into both eyes daily as needed (for dry eyes).    . promethazine (PHENERGAN) 25 MG tablet Take 25 mg by mouth every 4 (four) hours as needed for nausea or vomiting.    . raloxifene (EVISTA) 60 MG tablet Take 60 mg by mouth daily.    . simvastatin (ZOCOR) 40 MG tablet Take 40 mg by mouth every evening.     No current facility-administered medications for this encounter.    Physical Findings: The patient is in no acute distress. Patient is alert and oriented.  weight is 166 lb 12.8 oz (75.66 kg). Her oral temperature is 98.2 F (36.8 C). Her blood pressure is 152/65 and her pulse is 96. Her respiration is 20.  No palpable supraclavicular or axillary adenopathy. The lungs are clear to auscultation. The  heart has a regular rhythm and rate. The abdomen is soft and nontender with normal bowel sounds. Patient has hyperpigmentation changes from her previous radiation field directed at the port site recurrence. No inguinal adenopathy is appreciated. On pelvic examination the external genitalia are unremarkable. A speculum exam is performed. There are radiation changes noted in the vaginal vault but no mucosal lesions appreciated. On bimanual and rectovaginal examination there are no pelvic masses appreciated.  Lab Findings: Lab Results  Component Value Date   WBC 2.7* 05/20/2013   HGB 10.4* 05/20/2013   HCT 31.6* 05/20/2013   MCV 91.6 05/20/2013   PLT 220 05/20/2013    Radiographic Findings: No results found.  Impression:  No  evidence of recurrence on clinical exam today  Plan:  Routine follow-up in 6 months. In the interim the patient will be seen by gynecologic oncology.  This document serves as a record of services personally performed by Gery Pray, MD. It was created on his behalf by Darcus Austin, a trained medical scribe. The creation of this record is based on the scribe's personal observations and the provider's statements to them. This document has been checked and approved by the attending provider. ____________________________________ Blair Promise, MD

## 2014-11-06 NOTE — Progress Notes (Addendum)
Follow up s/p rad txs  Endometrial cancer treated 07/29/10-09/06/10  And 09/15/10;09/24/10;%09/28/10 no bleeding, or discharge, no nausea, uses dialator occasionally,ahsn't used in past 2 weeks until this am,  appete regular diet, uses miralax for constipation, started back walking again, energy level good still working also, had Pap smear in April 2016 with Dr. Skeet Latch was negative 11:40 AM BP 152/65 mmHg  Pulse 96  Temp(Src) 98.2 F (36.8 C) (Oral)  Resp 20  Wt 166 lb 12.8 oz (75.66 kg)  Wt Readings from Last 3 Encounters:  11/06/14 166 lb 12.8 oz (75.66 kg)  08/14/14 167 lb 3.2 oz (75.841 kg)  05/01/14 164 lb 9.6 oz (74.662 kg)

## 2015-01-07 ENCOUNTER — Encounter (HOSPITAL_COMMUNITY): Payer: Self-pay

## 2015-01-07 ENCOUNTER — Ambulatory Visit (HOSPITAL_COMMUNITY)
Admission: RE | Admit: 2015-01-07 | Discharge: 2015-01-07 | Disposition: A | Discharge status: Home / Self Care | Payer: Medicare Other | Source: Ambulatory Visit | Attending: Internal Medicine | Admitting: Internal Medicine

## 2015-01-07 ENCOUNTER — Other Ambulatory Visit (HOSPITAL_COMMUNITY): Payer: Self-pay | Admitting: Internal Medicine

## 2015-01-07 DIAGNOSIS — M810 Age-related osteoporosis without current pathological fracture: Secondary | ICD-10-CM | POA: Diagnosis not present

## 2015-01-07 HISTORY — DX: Age-related osteoporosis without current pathological fracture: M81.0

## 2015-01-07 MED ORDER — DENOSUMAB 60 MG/ML ~~LOC~~ SOLN
60.0000 mg | Freq: Once | SUBCUTANEOUS | Status: AC
  Administered 2015-01-07: 60 mg via SUBCUTANEOUS
  Filled 2015-01-07: qty 1

## 2015-01-07 NOTE — Progress Notes (Signed)
Uneventful first Prolia injection here in Short Stay. Pt states she had her last one at Dr Loren Racer office 12/15 and had no problems after injection

## 2015-01-07 NOTE — Discharge Instructions (Signed)
PROLIA °Denosumab injection °What is this medicine? °DENOSUMAB (den oh sue mab) slows bone breakdown. Prolia is used to treat osteoporosis in women after menopause and in men. Xgeva is used to prevent bone fractures and other bone problems caused by cancer bone metastases. Xgeva is also used to treat giant cell tumor of the bone. °This medicine may be used for other purposes; ask your health care provider or pharmacist if you have questions. °COMMON BRAND NAME(S): Prolia, XGEVA °What should I tell my health care provider before I take this medicine? °They need to know if you have any of these conditions: °-dental disease °-eczema °-infection or history of infections °-kidney disease or on dialysis °-low blood calcium or vitamin D °-malabsorption syndrome °-scheduled to have surgery or tooth extraction °-taking medicine that contains denosumab °-thyroid or parathyroid disease °-an unusual reaction to denosumab, other medicines, foods, dyes, or preservatives °-pregnant or trying to get pregnant °-breast-feeding °How should I use this medicine? °This medicine is for injection under the skin. It is given by a health care professional in a hospital or clinic setting. °If you are getting Prolia, a special MedGuide will be given to you by the pharmacist with each prescription and refill. Be sure to read this information carefully each time. °For Prolia, talk to your pediatrician regarding the use of this medicine in children. Special care may be needed. For Xgeva, talk to your pediatrician regarding the use of this medicine in children. While this drug may be prescribed for children as young as 13 years for selected conditions, precautions do apply. °Overdosage: If you think you've taken too much of this medicine contact a poison control center or emergency room at once. °Overdosage: If you think you have taken too much of this medicine contact a poison control center or emergency room at once. °NOTE: This medicine is only  for you. Do not share this medicine with others. °What if I miss a dose? °It is important not to miss your dose. Call your doctor or health care professional if you are unable to keep an appointment. °What may interact with this medicine? °Do not take this medicine with any of the following medications: °-other medicines containing denosumab °This medicine may also interact with the following medications: °-medicines that suppress the immune system °-medicines that treat cancer °-steroid medicines like prednisone or cortisone °This list may not describe all possible interactions. Give your health care provider a list of all the medicines, herbs, non-prescription drugs, or dietary supplements you use. Also tell them if you smoke, drink alcohol, or use illegal drugs. Some items may interact with your medicine. °What should I watch for while using this medicine? °Visit your doctor or health care professional for regular checks on your progress. Your doctor or health care professional may order blood tests and other tests to see how you are doing. °Call your doctor or health care professional if you get a cold or other infection while receiving this medicine. Do not treat yourself. This medicine may decrease your body's ability to fight infection. °You should make sure you get enough calcium and vitamin D while you are taking this medicine, unless your doctor tells you not to. Discuss the foods you eat and the vitamins you take with your health care professional. °See your dentist regularly. Brush and floss your teeth as directed. Before you have any dental work done, tell your dentist you are receiving this medicine. °Do not become pregnant while taking this medicine or for 5 months after   stopping it. Women should inform their doctor if they wish to become pregnant or think they might be pregnant. There is a potential for serious side effects to an unborn child. Talk to your health care professional or pharmacist for  more information. °What side effects may I notice from receiving this medicine? °Side effects that you should report to your doctor or health care professional as soon as possible: °-allergic reactions like skin rash, itching or hives, swelling of the face, lips, or tongue °-breathing problems °-chest pain °-fast, irregular heartbeat °-feeling faint or lightheaded, falls °-fever, chills, or any other sign of infection °-muscle spasms, tightening, or twitches °-numbness or tingling °-skin blisters or bumps, or is dry, peels, or red °-slow healing or unexplained pain in the mouth or jaw °-unusual bleeding or bruising °Side effects that usually do not require medical attention (Report these to your doctor or health care professional if they continue or are bothersome.): °-muscle pain °-stomach upset, gas °This list may not describe all possible side effects. Call your doctor for medical advice about side effects. You may report side effects to FDA at 1-800-FDA-1088. °Where should I keep my medicine? °This medicine is only given in a clinic, doctor's office, or other health care setting and will not be stored at home. °NOTE: This sheet is a summary. It may not cover all possible information. If you have questions about this medicine, talk to your doctor, pharmacist, or health care provider. °© 2015, [employer]/Gold Standard. (2011-10-10 12:37:47) °Osteoporosis °Throughout your life, your body breaks down old bone and replaces it with new bone. As you get older, your body does not replace bone as quickly as it breaks it down. By the age of 30 years, most people begin to gradually lose bone because of the imbalance between bone loss and replacement. Some people lose more bone than others. Bone loss beyond a specified normal degree is considered osteoporosis.  °Osteoporosis affects the strength and durability of your bones. The inside of the ends of your bones and your flat bones, like the bones of your pelvis, look like  honeycomb, filled with tiny open spaces. As bone loss occurs, your bones become less dense. This means that the open spaces inside your bones become bigger and the walls between these spaces become thinner. This makes your bones weaker. Bones of a person with osteoporosis can become so weak that they can break (fracture) during minor accidents, such as a simple fall. °CAUSES  °The following factors have been associated with the development of osteoporosis: °· Smoking. °· Drinking more than 2 alcoholic drinks several days per week. °· Long-term use of certain medicines: °¨ Corticosteroids. °¨ Chemotherapy medicines. °¨ Thyroid medicines. °¨ Antiepileptic medicines. °¨ Gonadal hormone suppression medicine. °¨ Immunosuppression medicine. °· Being underweight. °· Lack of physical activity. °· Lack of exposure to the sun. This can lead to vitamin D deficiency. °· Certain medical conditions: °¨ Certain inflammatory bowel diseases, such as Crohn disease and ulcerative colitis. °¨ Diabetes. °¨ Hyperthyroidism. °¨ Hyperparathyroidism. °RISK FACTORS °Anyone can develop osteoporosis. However, the following factors can increase your risk of developing osteoporosis: °· Gender--Women are at higher risk than men. °· Age--Being older than 50 years increases your risk. °· Ethnicity--White and Asian people have an increased risk. °· Weight --Being extremely underweight can increase your risk of osteoporosis. °· Family history of osteoporosis--Having a family member who has developed osteoporosis can increase your risk. °SYMPTOMS  °Usually, people with osteoporosis have no symptoms.  °DIAGNOSIS  °Signs during   a physical exam that may prompt your caregiver to suspect osteoporosis include: °· Decreased height. This is usually caused by the compression of the bones that form your spine (vertebrae) because they have weakened and become fractured. °· A curving or rounding of the upper back (kyphosis). °To confirm signs of osteoporosis,  your caregiver may request a procedure that uses 2 low-dose X-ray beams with different levels of energy to measure your bone mineral density (dual-energy X-ray absorptiometry [DXA]). Also, your caregiver may check your level of vitamin D. °TREATMENT  °The goal of osteoporosis treatment is to strengthen bones in order to decrease the risk of bone fractures. There are different types of medicines available to help achieve this goal. Some of these medicines work by slowing the processes of bone loss. Some medicines work by increasing bone density. Treatment also involves making sure that your levels of calcium and vitamin D are adequate. °PREVENTION  °There are things you can do to help prevent osteoporosis. Adequate intake of calcium and vitamin D can help you achieve optimal bone mineral density. Regular exercise can also help, especially resistance and weight-bearing activities. If you smoke, quitting smoking is an important part of osteoporosis prevention. °MAKE SURE YOU: °· Understand these instructions. °· Will watch your condition. °· Will get help right away if you are not doing well or get worse. °FOR MORE INFORMATION °www.osteo.org and www.nof.org °Document Released: 01/19/2005 Document Revised: 08/06/2012 Document Reviewed: 03/26/2011 °ExitCare® Patient Information ©2015 [employer]. This information is not intended to replace advice given to you by your health care provider. Make sure you discuss any questions you have with your health care provider. ° ° °

## 2015-01-12 DIAGNOSIS — Z1231 Encounter for screening mammogram for malignant neoplasm of breast: Secondary | ICD-10-CM | POA: Diagnosis not present

## 2015-01-24 DIAGNOSIS — Z23 Encounter for immunization: Secondary | ICD-10-CM | POA: Diagnosis not present

## 2015-02-05 ENCOUNTER — Encounter: Payer: Self-pay | Admitting: Gynecologic Oncology

## 2015-02-05 ENCOUNTER — Ambulatory Visit: Payer: Medicare Other | Attending: Gynecologic Oncology | Admitting: Gynecologic Oncology

## 2015-02-05 VITALS — BP 157/70 | HR 86 | Temp 98.4°F | Resp 18 | Ht 66.5 in | Wt 166.0 lb

## 2015-02-05 DIAGNOSIS — Z923 Personal history of irradiation: Secondary | ICD-10-CM

## 2015-02-05 DIAGNOSIS — Z8542 Personal history of malignant neoplasm of other parts of uterus: Secondary | ICD-10-CM

## 2015-02-05 DIAGNOSIS — Z9221 Personal history of antineoplastic chemotherapy: Secondary | ICD-10-CM | POA: Diagnosis not present

## 2015-02-05 DIAGNOSIS — C541 Malignant neoplasm of endometrium: Secondary | ICD-10-CM | POA: Diagnosis not present

## 2015-02-05 NOTE — Progress Notes (Signed)
Office Visit:  GYN ONCOLOGY  CHIEF COMPLAINT: Surveillance for  endometrial cancer.   ASSESSMENT/PLAN This is a 74 y.o.with stage IIIA grade 1endometrial cancer treated on GOG protocol 258.  He was randomized to the arm that received 6 cycles of Taxol carboplatin therapy. Recurrent disease was identified at a laparoscopic port site, pelvic lymph nodes and in the vagina in 2011.  She received additional Taxol carboplatin therapy and adjuvant external beam and vaginal brachytherapy. This treatment was completed in June 2012 and she's been without any evidence of disease since.  Follow-up in 6 months Will repeat pap 07/2015 F/u with Dr. Sondra Come 04/2015 F/U with Dr. Skeet Latch  07/2015 Only needs visits every 6 months after 07/2015    HISTORY OF PRESENT ILLNESS: This is a 74 y.o. , who underwent a robotic-assisted laparoscopic hysterectomy, bilateral salpingo- oophorectomy, lymph node dissection in July, 2010. Pathology was consistent with a stage III endometrial cancer with microscopic metastases to the adnexa. She was enrolled in GOG protocol 258 and was randomized to receive 6 cycles of Taxol and carboplatin. Posttreatment CT scan in January 2011 was WNl. At the end of 2011, on routine physical examination, she was found to have vaginal recurrence. CT imaging at that time confirmed the presence of an obturator mass at the pelvic port site in the left upper quadrant and enlarged obturator nodes. She  received 6 cycles of carboplatin and Taxol therapy. Imaging demonstrated residual left obturator and inguinal adenopathy and residual soft tissue mass and there was still residual disease in the vagina. She subsequently underwent external beam pelvic radiotherapy along with vaginal brachytherapy and treatment of the port sites. Treatment was completed in June, 2012.   Patient has had two episodes of SBO since pelvic radiotherapy, most recently in January 2015. These were managed with conservative  therapy.  PET 01/2013 (at the time of an SBO): without evidence of metastatic disease.  She saw Dr Sondra Come for routine surveillance in July 23rd, 2015. Pap at that visit showed atypical glandular cells with endometrioid phenotype. 11/2013 colposcopy and biopsy   Vagina, biopsy, right - SUPERFICIAL FRAGMENTS OF BENIGN SQUAMOUS EPITHELIUM. - NO MALIGNANCY IDENTIFIED, SEE COMMENT.  Pap 08/14/2014 NEGATIVE FOR INTRAEPITHELIAL LESIONS OR MALIGNANCY. BENIGN REACTIVE CHANGES ASSOCIATED WITH RADIATION EFFECT.  Patient feels very well.   Past Medical History  Diagnosis Date  . Status post chemotherapy     carboplatin/paclitaxel x 6 rounds  . Hypercholesterolemia   . History of colonic polyps   . Bursitis of shoulder, left   . Cancer [facility])     Endometrial ca/Recurrence  . S/P radiation therapy July 29, 2010-Sep 06, 2010    External beam of pelvis  . S/P radiation therapy 09/15/10, 09/24/10, 09/28/2010    Intracavitary brachytherapy  . Lumbar pain   . Hypercholesteremia   . Allergy   . Hypertension   . SBO (small bowel obstruction) ([place])   . Osteoporosis   Mammogram 12/2013 wnl   Past Surgical History  Procedure Laterality Date  . Abdominal hysterectomy    . Robotic assisted laparoscopic vaginal hysterectomy with fibroid removal  July 2010    bilat. salpingo-oophorectomy  . Breast lumpectomy  1993    Left breast, benign   SOCIAL HISTORY: The patient's daughter lives in [place] and will visit tomorrow for a long weekend and also Xmas.. The patient has good support in this area. Denies tobacco use.    REVIEW OF SYSTEMS Constitutional  Feels well , no weight loss Cardiovascular  No chest pain,  shortness of breath, or edema  Pulmonary  No cough or wheeze.  Gastro Intestinal  No nausea, vomitting, or diarrhoea. No bright red blood per rectum, no abdominal pain, change in bowel movement, or constipation. Has remained on a low residue diet since her SBO Genito Urinary  No frequency,  reports urgency with incontinence, no dysuria, no vaginal bleeding or discharge Musculo Skeletal  No myalgia, arthralgia, joint swelling or pain  Neurologic  No weakness, numbness, change in gait,  Psychology  No depression, anxiety, insomnia.     PHYSICAL EXAMINATION:  WD female in NAD VITAL SIGNS: BP 157/70 mmHg  Pulse 86  Temp(Src) 98.4 F (36.9 C) (Oral)  Resp 18  Ht 5' 6.5" (1.689 m)  Wt 166 lb (75.297 kg)  BMI 26.39 kg/m2  SpO2 99% CHEST: Clear to auscultation.  HEART: Regular rate and rhythm.  ABDOMEN: Soft, nontender, obese. No palpable lesions at the port sites. No evidence of hernia. No masses. No omental cake. No fluid wave.  BACK  No CVAT LN.  No axillary, cervical or supraclavicular adenopathy PELVIC: Normal external genitalia. Her vagina is very atrophic. With radiation changes.  No gross lesions, no cul de sac nodularity or pelvic masses.  Rectovaginal exam is confirmatory. External hemmoroids EXT 1-2+ lower extremity edema bilaterally   Janie Morning, MD

## 2015-02-05 NOTE — Patient Instructions (Signed)
Follow-up with Dr. Skeet Latch in May 2017 with a pap smear at this visit. Please call us sooner with any questions or concerns.

## 2015-02-12 ENCOUNTER — Ambulatory Visit: Payer: No Typology Code available for payment source | Admitting: Gynecologic Oncology

## 2015-03-10 DIAGNOSIS — M810 Age-related osteoporosis without current pathological fracture: Secondary | ICD-10-CM | POA: Diagnosis not present

## 2015-03-10 DIAGNOSIS — E784 Other hyperlipidemia: Secondary | ICD-10-CM | POA: Diagnosis not present

## 2015-03-10 DIAGNOSIS — R7302 Impaired glucose tolerance (oral): Secondary | ICD-10-CM | POA: Diagnosis not present

## 2015-03-10 DIAGNOSIS — I10 Essential (primary) hypertension: Secondary | ICD-10-CM | POA: Diagnosis not present

## 2015-03-17 DIAGNOSIS — R808 Other proteinuria: Secondary | ICD-10-CM | POA: Diagnosis not present

## 2015-03-17 DIAGNOSIS — R7302 Impaired glucose tolerance (oral): Secondary | ICD-10-CM | POA: Diagnosis not present

## 2015-03-17 DIAGNOSIS — M1990 Unspecified osteoarthritis, unspecified site: Secondary | ICD-10-CM | POA: Diagnosis not present

## 2015-03-17 DIAGNOSIS — I10 Essential (primary) hypertension: Secondary | ICD-10-CM | POA: Diagnosis not present

## 2015-03-17 DIAGNOSIS — E7800 Pure hypercholesterolemia, unspecified: Secondary | ICD-10-CM | POA: Diagnosis not present

## 2015-03-17 DIAGNOSIS — Z6827 Body mass index (BMI) 27.0-27.9, adult: Secondary | ICD-10-CM | POA: Diagnosis not present

## 2015-03-17 DIAGNOSIS — C541 Malignant neoplasm of endometrium: Secondary | ICD-10-CM | POA: Diagnosis not present

## 2015-03-17 DIAGNOSIS — E784 Other hyperlipidemia: Secondary | ICD-10-CM | POA: Diagnosis not present

## 2015-03-17 DIAGNOSIS — Z0000 Encounter for general adult medical examination without abnormal findings: Secondary | ICD-10-CM | POA: Diagnosis not present

## 2015-03-17 DIAGNOSIS — M810 Age-related osteoporosis without current pathological fracture: Secondary | ICD-10-CM | POA: Diagnosis not present

## 2015-03-17 DIAGNOSIS — J302 Other seasonal allergic rhinitis: Secondary | ICD-10-CM | POA: Diagnosis not present

## 2015-03-17 DIAGNOSIS — N183 Chronic kidney disease, stage 3 (moderate): Secondary | ICD-10-CM | POA: Diagnosis not present

## 2015-03-18 DIAGNOSIS — Z1212 Encounter for screening for malignant neoplasm of rectum: Secondary | ICD-10-CM | POA: Diagnosis not present

## 2015-03-26 ENCOUNTER — Encounter: Payer: Self-pay | Admitting: Internal Medicine

## 2015-05-07 ENCOUNTER — Ambulatory Visit
Admission: RE | Admit: 2015-05-07 | Discharge: 2015-05-07 | Disposition: A | Discharge status: Home / Self Care | Payer: Medicare Other | Source: Ambulatory Visit | Attending: Radiation Oncology | Admitting: Radiation Oncology

## 2015-05-07 ENCOUNTER — Encounter: Payer: Self-pay | Admitting: Radiation Oncology

## 2015-05-07 VITALS — BP 147/69 | HR 89 | Temp 98.5°F | Ht 66.5 in | Wt 162.2 lb

## 2015-05-07 DIAGNOSIS — C549 Malignant neoplasm of corpus uteri, unspecified: Secondary | ICD-10-CM | POA: Diagnosis not present

## 2015-05-07 NOTE — Progress Notes (Signed)
[name] here for follow up.  She denies having pain.  She reports urinary incontinence after sitting for long periods.  She reports having occasional constipation and takes Miralax as needed.  She reports having occasionally vaginal spotting after using her dilator.  She denies having any rectal bleeding.  She uses her dilator 2 times per week.  She reports her energy level is Ok.  She is recovering from a cold. She continues to work about 40 hours per week.   BP 147/69 mmHg  Pulse 89  Temp(Src) 98.5 F (36.9 C)  Ht 5' 6.5" (1.689 m)  Wt 162 lb 3.2 oz (73.573 kg)  BMI 25.79 kg/m2

## 2015-05-07 NOTE — Progress Notes (Signed)
Radiation Oncology         [phone] ________________________________  Name: [name] MRN: [id number]  Date: 05/07/2015  DOB: Sep 15, 1940  Follow-Up Visit Note  CC: Haywood Pao, MD  Nancy Marus, MD   Diagnosis:   Recurrent endometrial cancer  Interval Since Last Radiation:  June 2012 (4 1/2 years)  Narrative:  The patient returns today for routine follow-up. She saw Dr. Skeet Latch in October 2016 who will do a repeat Pap smear in April 2017. She denies having pain. She reports urinary incontinence after sitting for long periods. She reports having occasional constipation and takes Miralax as needed. She reports having occasional vaginal spotting after using her dilator. She denies having any rectal bleeding, but had heme positive stool. She attributes this to hemorrhoids. She uses her dilator 2 times per week. She reports her energy level is fair. She is recovering from a cold. She continues to work about 40 hours per week.  ALLERGIES:  is allergic to phenobarbital.  Meds: Current Outpatient Prescriptions  Medication Sig Dispense Refill  . aspirin 325 MG tablet Take 325 mg by mouth daily.    . benazepril (LOTENSIN) 20 MG tablet Take 20 mg by mouth daily.    . calcium carbonate (OS-CAL) 600 MG TABS tablet Take 600 mg by mouth daily with breakfast.    . cetirizine (ZYRTEC) 10 MG tablet Take 10 mg by mouth daily.    . cholecalciferol (VITAMIN D) 1000 UNITS tablet Take 1,000 Units by mouth daily.    Marland Kitchen denosumab (PROLIA) 60 MG/ML SOLN injection Inject 60 mg into the skin every 6 (six) months. Administer in upper arm, thigh, or abdomen    . diclofenac sodium (VOLTAREN) 1 % GEL Apply 2 g topically daily as needed (for pain).    Marland Kitchen docusate sodium (COLACE) 100 MG capsule Take 100 mg by mouth daily as needed for mild constipation.    . Folic Acid-Vit [id number] [id number] (FOLBEE) 2.5-25-1 MG TABS Take 1 tablet by mouth daily.    Marland Kitchen ibuprofen (ADVIL,MOTRIN) 200 MG tablet Take 200 mg by mouth  every 6 (six) hours as needed.    . loratadine (CLARITIN) 10 MG tablet Take 10 mg by mouth daily.    . meclizine (ANTIVERT) 25 MG tablet Take 25 mg by mouth 4 (four) times daily as needed for dizziness or nausea.    . Multiple Vitamin (MULTIVITAMIN) tablet Take 1 tablet by mouth daily. Centrum silver    . Omega-3 Fatty Acids (FISH OIL) 1200 MG CAPS Take 1,200 mg by mouth daily.    Vladimir Faster Glycol-Propyl Glycol (SYSTANE) 0.4-0.3 % SOLN Place 1 drop into both eyes daily as needed (for dry eyes).    . promethazine (PHENERGAN) 25 MG tablet Take 25 mg by mouth every 4 (four) hours as needed for nausea or vomiting.    . simvastatin (ZOCOR) 40 MG tablet Take 40 mg by mouth every evening.     No current facility-administered medications for this encounter.    Physical Findings: The patient is in no acute distress. Patient is alert and oriented.  height is 5' 6.5" (1.689 m) and weight is 162 lb 3.2 oz (73.573 kg). Her temperature is 98.5 F (36.9 C). Her blood pressure is 147/69 and her pulse is 89.   No palpable supraclavicular or axillary adenopathy. The lungs are clear to auscultation. The heart has a regular rhythm and rate. The abdomen is soft and nontender with normal bowel sounds. Patient has hyperpigmentation changes from her previous  radiation field directed at the port site recurrence. No inguinal adenopathy is appreciated. On pelvic examination the external genitalia are unremarkable. A speculum exam was performed. There are radiation changes noted in the vaginal vault, but no mucosal lesions appreciated. On bimanual and rectovaginal examination there are no pelvic masses appreciated. No Pap smear was obtained.  Lab Findings: Lab Results  Component Value Date   WBC 2.7* 05/20/2013   HGB 10.4* 05/20/2013   HCT 31.6* 05/20/2013   MCV 91.6 05/20/2013   PLT 220 05/20/2013    Radiographic Findings: No results found.  Impression:  No evidence of recurrence on clinical exam  today  Plan:  Routine follow-up in 6 months. In the interim the patient will be seen by gynecologic oncology. She is scheduled to see Dr. Henrene Pastor of gastroenterology in early February. She did have heme positive stool recently prompting this evaluation.  ____________________________________ Blair Promise, MD  This document serves as a record of services personally performed by Gery Pray, MD. It was created on his behalf by Darcus Austin, a trained medical scribe. The creation of this record is based on the scribe's personal observations and the provider's statements to them. This document has been checked and approved by the attending provider.

## 2015-05-22 ENCOUNTER — Inpatient Hospital Stay: Admission: RE | Admit: 2015-05-22 | Payer: Self-pay | Source: Ambulatory Visit

## 2015-05-28 ENCOUNTER — Encounter: Payer: Self-pay | Admitting: Internal Medicine

## 2015-05-28 ENCOUNTER — Ambulatory Visit (INDEPENDENT_AMBULATORY_CARE_PROVIDER_SITE_OTHER): Payer: Medicare Other | Admitting: Internal Medicine

## 2015-05-28 ENCOUNTER — Other Ambulatory Visit: Payer: Self-pay

## 2015-05-28 VITALS — BP 142/90 | HR 91 | Ht 66.0 in | Wt 162.6 lb

## 2015-05-28 DIAGNOSIS — R195 Other fecal abnormalities: Secondary | ICD-10-CM | POA: Diagnosis not present

## 2015-05-28 DIAGNOSIS — K56609 Unspecified intestinal obstruction, unspecified as to partial versus complete obstruction: Secondary | ICD-10-CM

## 2015-05-28 DIAGNOSIS — K5669 Other intestinal obstruction: Secondary | ICD-10-CM | POA: Diagnosis not present

## 2015-05-28 DIAGNOSIS — K56699 Other intestinal obstruction unspecified as to partial versus complete obstruction: Secondary | ICD-10-CM

## 2015-05-28 NOTE — Progress Notes (Signed)
HISTORY OF PRESENT ILLNESS:  [name] is a 75 y.o. female with multiple significant medical problems including a history of advanced endometrial cancer who is referred today by her primary care physician Dr. Osborne Casco with chief complaint of Hemoccult-positive stool. I have reviewed outside records, laboratories, and the current EMR. Also prior endoscopy reports. I saw the patient 02/04/2004 for routine screening colonoscopy. Colonoscopy with intubation of the ileum was normal. She did have internal hemorrhoids. She was diagnosed with locally advanced endometrial cancer (stage IIIa) for which she was operated July 2010. Subsequently underwent chemoradiation therapy. Had recurrence thereafter with further therapies required. Felt to have no evidence of disease since 2012. Has been followed regular by her gynecologist, oncologic gynecologist, and radiation therapies. As part of her routine annual evaluation in November 2016 she was found to have Hemoccult-positive stool. Patient reports that she had transient constipation and known hemorrhoids though she did not see any obvious blood. She denies melena. Blood work from that same month reveals normal hemoglobin of 13.2. Comprehensive metabolic panel was unremarkable. Hemoglobin A1c 6.3. She is now sent for evaluation. GI review of systems except for occasional constipation is negative. She does have a history of recurrent small bowel obstruction for which she was hospitalized. Most recent episode January 2015  REVIEW OF SYSTEMS:  All non-GI ROS negative except for sinus allergy, back pain  Past Medical History  Diagnosis Date  . Status post chemotherapy     carboplatin/paclitaxel x 6 rounds  . Hypercholesterolemia   . History of colonic polyps   . Bursitis of shoulder, left   . Cancer [facility])     Endometrial ca/Recurrence  . S/P radiation therapy July 29, 2010-Sep 06, 2010    External beam of pelvis  . S/P radiation therapy 09/15/10, 09/24/10,  09/28/2010    Intracavitary brachytherapy  . Lumbar pain   . Hypercholesteremia   . Allergy   . Hypertension   . SBO (small bowel obstruction) ([place])   . Osteoporosis   . CKD (chronic kidney disease), stage III   . Benign positional vertigo   . Osteoarthritis     Past Surgical History  Procedure Laterality Date  . Abdominal hysterectomy    . Robotic assisted laparoscopic vaginal hysterectomy with fibroid removal  July 2010    bilat. salpingo-oophorectomy  . Breast lumpectomy  1993    Left breast, benign    Social History [name]  reports that she quit smoking about 39 years ago. Her smoking use included Cigarettes. She smoked 1.00 pack per day. She has never used smokeless tobacco. She reports that she does not drink alcohol or use illicit drugs.  family history includes Breast cancer in her mother and sister; Diabetes in her sister; Heart disease in her father.  Allergies  Allergen Reactions  . Phenobarbital     Feeling of uneasiness       PHYSICAL EXAMINATION: Vital signs: BP 142/90 mmHg  Pulse 91  Ht 5\' 6"  (1.676 m)  Wt 162 lb 9.6 oz (73.755 kg)  BMI 26.26 kg/m2  Constitutional: Pleasant, generally well-appearing, no acute distress Psychiatric: alert and oriented x3, cooperative Eyes: extraocular movements intact, anicteric, conjunctiva pink Mouth: oral pharynx moist, no lesions Neck: supple without thyromegaly Lymph: no lymphadenopathy Cardiovascular: heart regular rate and rhythm, no murmur Lungs: clear to auscultation bilaterally Abdomen: soft, nontender, nondistended, no obvious ascites, no peritoneal signs, normal bowel sounds, no organomegaly Rectal: Ommitted Extremities: no clubbing cyanosis or lower extremity edema bilaterally Skin: no lesions on visible  extremities Neuro: No focal deficits. Normal DTRs  ASSESSMENT:  #1. Hemoccult-positive stool. Asymptomatic. Normal hemoglobin. Question significant underlying GI mucosal process (occult) #2.  Extensive endometrial cancer history as described #3. Normal colonoscopy 2005   PLAN:  #1. I discussed with her possible causes for Hemoccult positive stool. Also that no significant pathology might be identified despite extensive workup #2. We discussed several ways to evaluate the colon including optical colonoscopy, virtual colonoscopy, and barium radiology. The pros and cons of each were discussed in detail. The main concern with optical colonoscopy straightaway (which is generally chosen) would be increased risk of incomplete examination or perforation given her extensive history of GYN surgery, chemotherapy, and radiation. As such, I recommended virtual colonoscopy as this is much more sensitive than barium radiology. If significant pathology were found, we would need to consider optical colonoscopy, continuing to understand limitations and risks. She agrees.  A copy of this consultation and has been sent to her physicians

## 2015-05-28 NOTE — Patient Instructions (Addendum)
    You have been scheduled for a virtual colonoscopy at [facility], [place], [place] [address].  The phone number is [phone]. You will need to go by their office several days prior to the test to pick up the prep.  Your appointment is 06/04/2015 at 8:00am.  Please arrive at 7:40am

## 2015-06-04 ENCOUNTER — Ambulatory Visit
Admission: RE | Admit: 2015-06-04 | Discharge: 2015-06-04 | Disposition: A | Discharge status: Home / Self Care | Payer: Medicare Other | Source: Ambulatory Visit | Attending: Internal Medicine | Admitting: Internal Medicine

## 2015-06-04 DIAGNOSIS — K56609 Unspecified intestinal obstruction, unspecified as to partial versus complete obstruction: Secondary | ICD-10-CM

## 2015-06-04 DIAGNOSIS — K5669 Other intestinal obstruction: Secondary | ICD-10-CM | POA: Diagnosis not present

## 2015-06-04 DIAGNOSIS — R195 Other fecal abnormalities: Secondary | ICD-10-CM

## 2015-06-04 DIAGNOSIS — K56699 Other intestinal obstruction unspecified as to partial versus complete obstruction: Secondary | ICD-10-CM

## 2015-07-07 ENCOUNTER — Encounter (HOSPITAL_COMMUNITY): Payer: Self-pay

## 2015-07-07 ENCOUNTER — Ambulatory Visit (HOSPITAL_COMMUNITY)
Admission: RE | Admit: 2015-07-07 | Discharge: 2015-07-07 | Disposition: A | Discharge status: Home / Self Care | Payer: Medicare Other | Source: Ambulatory Visit | Attending: Internal Medicine | Admitting: Internal Medicine

## 2015-07-07 DIAGNOSIS — M810 Age-related osteoporosis without current pathological fracture: Secondary | ICD-10-CM | POA: Diagnosis not present

## 2015-07-07 MED ORDER — DENOSUMAB 60 MG/ML ~~LOC~~ SOLN
60.0000 mg | Freq: Once | SUBCUTANEOUS | Status: AC
  Administered 2015-07-07: 60 mg via SUBCUTANEOUS
  Filled 2015-07-07: qty 1

## 2015-07-07 NOTE — Discharge Instructions (Signed)
Denosumab injection  What is this medicine?  DENOSUMAB (den oh sue mab) slows bone breakdown. Prolia is used to treat osteoporosis in women after menopause and in men. Xgeva is used to prevent bone fractures and other bone problems caused by cancer bone metastases. Xgeva is also used to treat giant cell tumor of the bone.  This medicine may be used for other purposes; ask your health care provider or pharmacist if you have questions.  What should I tell my health care provider before I take this medicine?  They need to know if you have any of these conditions:  -dental disease  -eczema  -infection or history of infections  -kidney disease or on dialysis  -low blood calcium or vitamin D  -malabsorption syndrome  -scheduled to have surgery or tooth extraction  -taking medicine that contains denosumab  -thyroid or parathyroid disease  -an unusual reaction to denosumab, other medicines, foods, dyes, or preservatives  -pregnant or trying to get pregnant  -breast-feeding  How should I use this medicine?  This medicine is for injection under the skin. It is given by a health care professional in a hospital or clinic setting.  If you are getting Prolia, a special MedGuide will be given to you by the pharmacist with each prescription and refill. Be sure to read this information carefully each time.  For Prolia, talk to your pediatrician regarding the use of this medicine in children. Special care may be needed. For Xgeva, talk to your pediatrician regarding the use of this medicine in children. While this drug may be prescribed for children as young as 13 years for selected conditions, precautions do apply.  Overdosage: If you think you have taken too much of this medicine contact a poison control center or emergency room at once.  NOTE: This medicine is only for you. Do not share this medicine with others.  What if I miss a dose?  It is important not to miss your dose. Call your doctor or health care professional if you are  unable to keep an appointment.  What may interact with this medicine?  Do not take this medicine with any of the following medications:  -other medicines containing denosumab  This medicine may also interact with the following medications:  -medicines that suppress the immune system  -medicines that treat cancer  -steroid medicines like prednisone or cortisone  This list may not describe all possible interactions. Give your health care provider a list of all the medicines, herbs, non-prescription drugs, or dietary supplements you use. Also tell them if you smoke, drink alcohol, or use illegal drugs. Some items may interact with your medicine.  What should I watch for while using this medicine?  Visit your doctor or health care professional for regular checks on your progress. Your doctor or health care professional may order blood tests and other tests to see how you are doing.  Call your doctor or health care professional if you get a cold or other infection while receiving this medicine. Do not treat yourself. This medicine may decrease your body's ability to fight infection.  You should make sure you get enough calcium and vitamin D while you are taking this medicine, unless your doctor tells you not to. Discuss the foods you eat and the vitamins you take with your health care professional.  See your dentist regularly. Brush and floss your teeth as directed. Before you have any dental work done, tell your dentist you are receiving this medicine.  Do   not become pregnant while taking this medicine or for 5 months after stopping it. Women should inform their doctor if they wish to become pregnant or think they might be pregnant. There is a potential for serious side effects to an unborn child. Talk to your health care professional or pharmacist for more information.  What side effects may I notice from receiving this medicine?  Side effects that you should report to your doctor or health care professional as soon as  possible:  -allergic reactions like skin rash, itching or hives, swelling of the face, lips, or tongue  -breathing problems  -chest pain  -fast, irregular heartbeat  -feeling faint or lightheaded, falls  -fever, chills, or any other sign of infection  -muscle spasms, tightening, or twitches  -numbness or tingling  -skin blisters or bumps, or is dry, peels, or red  -slow healing or unexplained pain in the mouth or jaw  -unusual bleeding or bruising  Side effects that usually do not require medical attention (Report these to your doctor or health care professional if they continue or are bothersome.):  -muscle pain  -stomach upset, gas  This list may not describe all possible side effects. Call your doctor for medical advice about side effects. You may report side effects to FDA at 1-800-FDA-1088.  Where should I keep my medicine?  This medicine is only given in a clinic, doctor's office, or other health care setting and will not be stored at home.  NOTE: This sheet is a summary. It may not cover all possible information. If you have questions about this medicine, talk to your doctor, pharmacist, or health care provider.      2016, Elsevier/Gold Standard. (2011-10-10 12:37:47)

## 2015-08-06 ENCOUNTER — Ambulatory Visit: Payer: Self-pay | Admitting: Radiation Oncology

## 2015-08-26 ENCOUNTER — Other Ambulatory Visit (HOSPITAL_COMMUNITY)
Admission: RE | Admit: 2015-08-26 | Discharge: 2015-08-26 | Disposition: A | Discharge status: Home / Self Care | Payer: Medicare Other | Source: Ambulatory Visit | Attending: Gynecologic Oncology | Admitting: Gynecologic Oncology

## 2015-08-26 DIAGNOSIS — Z01411 Encounter for gynecological examination (general) (routine) with abnormal findings: Secondary | ICD-10-CM | POA: Insufficient documentation

## 2015-08-26 NOTE — Progress Notes (Signed)
Office Visit:  GYN ONCOLOGY  CHIEF COMPLAINT: Surveillance for  endometrial cancer.   ASSESSMENT/PLAN This is a 75 y.o.with stage IIIA grade 1endometrial cancer treated on GOG protocol 258.  He was randomized to the arm that received 6 cycles of Taxol carboplatin therapy. Recurrent disease was identified at a laparoscopic port site, pelvic lymph nodes and in the vagina in 2011.  She received additional Taxol carboplatin therapy and adjuvant external beam and vaginal brachytherapy. This treatment was completed in June 2012 and she's been without any evidence of disease since.  Follow-up in 6 months Pap collected today  F/U with Dr. Skeet Latch  11//2017 Only needs annual visits months after 02/2016  Urge Incontinence Referral placed to Dr. Zigmund Daniel in urogynecology  HISTORY OF PRESENT ILLNESS: This is a 75 y.o. , who underwent a robotic-assisted laparoscopic hysterectomy, bilateral salpingo- oophorectomy, lymph node dissection in July, 2010. Pathology was consistent with a stage III endometrial cancer with microscopic metastases to the adnexa. She was enrolled in GOG protocol 258 and was randomized to receive 6 cycles of Taxol and carboplatin. Posttreatment CT scan in January 2011 was WNl. At the end of 2011, on routine physical examination, she was found to have  vaginal recurrence. CT imaging at that time confirmed the presence of an obturator mass at the pelvic port site in the left upper quadrant and enlarged obturator nodes. She  received 6 cycles of carboplatin and Taxol therapy. Imaging demonstrated residual left obturator and inguinal adenopathy and residual soft tissue mass and there was still residual disease in the vagina. She subsequently underwent external beam pelvic radiotherapy along with vaginal brachytherapy and treatment of the port sites. Treatment was completed in June, 2012.   Patient has had two episodes of SBO since pelvic radiotherapy, most recently in January 2015. These were  managed with conservative therapy.  PET 01/2013 (at the time of an SBO): without evidence of metastatic disease.  She saw Dr Sondra Come for routine surveillance in July 23rd, 2015. Pap at that visit showed atypical glandular cells with endometrioid phenotype. 11/2013 colposcopy and biopsy   Vagina, biopsy, right - SUPERFICIAL FRAGMENTS OF BENIGN SQUAMOUS EPITHELIUM. - NO MALIGNANCY IDENTIFIED, SEE COMMENT.  Pap 08/14/2014 NEGATIVE Patient feels very well.   Past Medical History  Diagnosis Date  . Status post chemotherapy     carboplatin/paclitaxel x 6 rounds  . Hypercholesterolemia   . History of colonic polyps   . Bursitis of shoulder, left   . Cancer [facility])     Endometrial ca/Recurrence  . S/P radiation therapy July 29, 2010-Sep 06, 2010    External beam of pelvis  . S/P radiation therapy 09/15/10, 09/24/10, 09/28/2010    Intracavitary brachytherapy  . Lumbar pain   . Hypercholesteremia   . Allergy   . Hypertension   . SBO (small bowel obstruction) ([place])   . Osteoporosis   . CKD (chronic kidney disease), stage III   . Benign positional vertigo   . Osteoarthritis   Mammogram 12/2013 wnl   Past Surgical History  Procedure Laterality Date  . Abdominal hysterectomy    . Robotic assisted laparoscopic vaginal hysterectomy with fibroid removal  July 2010    bilat. salpingo-oophorectomy  . Breast lumpectomy  1993    Left breast, benign   SOCIAL HISTORY: Life is good and stable no family changes   REVIEW OF SYSTEMS Constitutional  Feels well , no weight loss Cardiovascular  No chest pain, shortness of breath, or edema  Pulmonary  No cough or wheeze.  Gastro Intestinal  No nausea, vomitting, or diarrhoea. No bright red blood per rectum, no abdominal pain, change in bowel movement, or constipation. Has remained on a low residue diet since her SBO Genito Urinary  No frequency, reports urgency with incontinence, no dysuria, no vaginal bleeding or discharge Musculo Skeletal  No  myalgia, arthralgia, joint swelling or pain  Neurologic  No weakness, numbness, change in gait,  Psychology  No depression, anxiety, insomnia.     PHYSICAL EXAMINATION:  WD female in NAD VITAL SIGNS: BP 149/80 mmHg  Pulse 85  Temp(Src) 98 F (36.7 C) (Oral)  Resp 18  Ht 5\' 6"  (1.676 m)  Wt 163 lb 3.2 oz (74.027 kg)  BMI 26.35 kg/m2  SpO2 98% CHEST: Clear to auscultation.  HEART: Regular rate and rhythm.  ABDOMEN: Soft, nontender, obese. No palpable lesions at the port sites. No evidence of hernia. No masses. No omental cake. No fluid wave.  BACK  No CVAT LN.  No axillary, cervical or supraclavicular adenopathy PELVIC: Normal external genitalia. Her vagina is very atrophic and foreshortened . With radiation changes.  No gross lesions, no cul de sac nodularity or pelvic masses.Pap collected  Rectovaginal exam is confirmatory. External hemmorhoids nontender  EXT 1-2+ lower extremity edema bilaterally

## 2015-08-27 ENCOUNTER — Encounter: Payer: Self-pay | Admitting: Gynecologic Oncology

## 2015-08-27 ENCOUNTER — Ambulatory Visit: Payer: Medicare Other | Attending: Gynecologic Oncology | Admitting: Gynecologic Oncology

## 2015-08-27 VITALS — BP 149/80 | HR 85 | Temp 98.0°F | Resp 18 | Ht 66.0 in | Wt 163.2 lb

## 2015-08-27 DIAGNOSIS — K5660 Unspecified intestinal obstruction: Secondary | ICD-10-CM | POA: Diagnosis not present

## 2015-08-27 DIAGNOSIS — Z9221 Personal history of antineoplastic chemotherapy: Secondary | ICD-10-CM | POA: Diagnosis not present

## 2015-08-27 DIAGNOSIS — E7800 Pure hypercholesterolemia, unspecified: Secondary | ICD-10-CM | POA: Insufficient documentation

## 2015-08-27 DIAGNOSIS — Z90710 Acquired absence of both cervix and uterus: Secondary | ICD-10-CM | POA: Insufficient documentation

## 2015-08-27 DIAGNOSIS — M810 Age-related osteoporosis without current pathological fracture: Secondary | ICD-10-CM | POA: Insufficient documentation

## 2015-08-27 DIAGNOSIS — N3941 Urge incontinence: Secondary | ICD-10-CM

## 2015-08-27 DIAGNOSIS — Z9079 Acquired absence of other genital organ(s): Secondary | ICD-10-CM | POA: Diagnosis not present

## 2015-08-27 DIAGNOSIS — R42 Dizziness and giddiness: Secondary | ICD-10-CM | POA: Insufficient documentation

## 2015-08-27 DIAGNOSIS — I129 Hypertensive chronic kidney disease with stage 1 through stage 4 chronic kidney disease, or unspecified chronic kidney disease: Secondary | ICD-10-CM | POA: Insufficient documentation

## 2015-08-27 DIAGNOSIS — C541 Malignant neoplasm of endometrium: Secondary | ICD-10-CM | POA: Diagnosis not present

## 2015-08-27 DIAGNOSIS — Z8542 Personal history of malignant neoplasm of other parts of uterus: Secondary | ICD-10-CM | POA: Diagnosis not present

## 2015-08-27 DIAGNOSIS — Z923 Personal history of irradiation: Secondary | ICD-10-CM | POA: Diagnosis not present

## 2015-08-27 DIAGNOSIS — Z86010 Personal history of colonic polyps: Secondary | ICD-10-CM | POA: Insufficient documentation

## 2015-08-27 DIAGNOSIS — C549 Malignant neoplasm of corpus uteri, unspecified: Secondary | ICD-10-CM

## 2015-08-27 DIAGNOSIS — Z90722 Acquired absence of ovaries, bilateral: Secondary | ICD-10-CM | POA: Insufficient documentation

## 2015-08-27 DIAGNOSIS — N183 Chronic kidney disease, stage 3 (moderate): Secondary | ICD-10-CM | POA: Diagnosis not present

## 2015-08-27 NOTE — Patient Instructions (Signed)
We will call you with the results of your pap smear from today.  Plan to follow up in November or sooner if needed.  If you would like [place] to schedule an appointment with Dr. Maryland Pink, UroGynecologist with [facility] who has an office in [place] please call us at [phone].

## 2015-08-28 NOTE — Addendum Note (Signed)
Addended by: Elizebeth Koller on: 08/28/2015 12:27 PM   Modules accepted: Orders

## 2015-09-01 LAB — CYTOLOGY - PAP

## 2015-09-02 ENCOUNTER — Telehealth: Payer: Self-pay

## 2015-09-02 NOTE — Telephone Encounter (Signed)
Orders received from [place] to contact the patient to update with her PAP results being " normal , showing benign radiation changes. Attempted to contact the patient , no answer , left a detailed message with call back information if additional questions arise.

## 2015-09-17 DIAGNOSIS — L578 Other skin changes due to chronic exposure to nonionizing radiation: Secondary | ICD-10-CM | POA: Diagnosis not present

## 2015-09-17 DIAGNOSIS — L820 Inflamed seborrheic keratosis: Secondary | ICD-10-CM | POA: Diagnosis not present

## 2015-09-17 DIAGNOSIS — L720 Epidermal cyst: Secondary | ICD-10-CM | POA: Diagnosis not present

## 2015-09-30 DIAGNOSIS — M810 Age-related osteoporosis without current pathological fracture: Secondary | ICD-10-CM | POA: Diagnosis not present

## 2015-09-30 DIAGNOSIS — Z1389 Encounter for screening for other disorder: Secondary | ICD-10-CM | POA: Diagnosis not present

## 2015-09-30 DIAGNOSIS — H8110 Benign paroxysmal vertigo, unspecified ear: Secondary | ICD-10-CM | POA: Diagnosis not present

## 2015-09-30 DIAGNOSIS — R7302 Impaired glucose tolerance (oral): Secondary | ICD-10-CM | POA: Diagnosis not present

## 2015-09-30 DIAGNOSIS — J302 Other seasonal allergic rhinitis: Secondary | ICD-10-CM | POA: Diagnosis not present

## 2015-09-30 DIAGNOSIS — E7800 Pure hypercholesterolemia, unspecified: Secondary | ICD-10-CM | POA: Diagnosis not present

## 2015-09-30 DIAGNOSIS — M1990 Unspecified osteoarthritis, unspecified site: Secondary | ICD-10-CM | POA: Diagnosis not present

## 2015-09-30 DIAGNOSIS — R809 Proteinuria, unspecified: Secondary | ICD-10-CM | POA: Diagnosis not present

## 2015-09-30 DIAGNOSIS — N183 Chronic kidney disease, stage 3 (moderate): Secondary | ICD-10-CM | POA: Diagnosis not present

## 2015-09-30 DIAGNOSIS — C541 Malignant neoplasm of endometrium: Secondary | ICD-10-CM | POA: Diagnosis not present

## 2015-09-30 DIAGNOSIS — I10 Essential (primary) hypertension: Secondary | ICD-10-CM | POA: Diagnosis not present

## 2015-10-01 DIAGNOSIS — H5203 Hypermetropia, bilateral: Secondary | ICD-10-CM | POA: Diagnosis not present

## 2015-10-01 DIAGNOSIS — H524 Presbyopia: Secondary | ICD-10-CM | POA: Diagnosis not present

## 2015-10-01 DIAGNOSIS — H2513 Age-related nuclear cataract, bilateral: Secondary | ICD-10-CM | POA: Diagnosis not present

## 2015-11-12 ENCOUNTER — Encounter: Payer: Self-pay | Admitting: Radiation Oncology

## 2015-11-12 ENCOUNTER — Ambulatory Visit
Admission: RE | Admit: 2015-11-12 | Discharge: 2015-11-12 | Disposition: A | Discharge status: Home / Self Care | Payer: Medicare Other | Source: Ambulatory Visit | Attending: Radiation Oncology | Admitting: Radiation Oncology

## 2015-11-12 VITALS — BP 171/80 | HR 88 | Temp 98.6°F | Ht 66.0 in | Wt 164.7 lb

## 2015-11-12 DIAGNOSIS — K5900 Constipation, unspecified: Secondary | ICD-10-CM | POA: Diagnosis not present

## 2015-11-12 DIAGNOSIS — C541 Malignant neoplasm of endometrium: Secondary | ICD-10-CM | POA: Diagnosis not present

## 2015-11-12 DIAGNOSIS — Z08 Encounter for follow-up examination after completed treatment for malignant neoplasm: Secondary | ICD-10-CM | POA: Diagnosis not present

## 2015-11-12 DIAGNOSIS — Z923 Personal history of irradiation: Secondary | ICD-10-CM | POA: Diagnosis not present

## 2015-11-12 DIAGNOSIS — C549 Malignant neoplasm of corpus uteri, unspecified: Secondary | ICD-10-CM

## 2015-11-12 DIAGNOSIS — Z8542 Personal history of malignant neoplasm of other parts of uterus: Secondary | ICD-10-CM | POA: Diagnosis not present

## 2015-11-12 NOTE — Progress Notes (Signed)
[name] here for follow up after treatment for endometrial cancer.  She denies having pain.  She reports having occasional nausea and takes phenergan as needed.  She reports having constipation that comes and goes.  She had a bowel movement this morning and is taking Miralax.  She denies having any vaginal/rectal bleeding or discharge.  She reports her energy level is OK.  She continues to work full time.  She is using her vaginal dilator occasionally.   Her bp is high today at 171/80.  BP 171/80 mmHg  Pulse 88  Temp(Src) 98.6 F (37 C) (Oral)  Ht 5\' 6"  (1.676 m)  Wt 164 lb 11.2 oz (74.707 kg)  BMI 26.60 kg/m2  SpO2 100%   Wt Readings from Last 3 Encounters:  11/12/15 164 lb 11.2 oz (74.707 kg)  08/27/15 163 lb 3.2 oz (74.027 kg)  07/07/15 166 lb 12.8 oz (75.66 kg)

## 2015-11-12 NOTE — Addendum Note (Signed)
Encounter addended by: Karen R Hess, RN on: 11/12/2015  2:15 PM<BR>     Documentation filed: Charges VN

## 2015-11-12 NOTE — Progress Notes (Addendum)
Radiation Oncology         [phone] ________________________________  Name: [name] MRN: [id number]  Date: 11/12/2015  DOB: [birth year]  Follow-Up Visit Note  CC: Haywood Pao, MD  Nancy Marus, MD  Diagnosis:   Recurrent endometrial cancer  Interval Since Last Radiation:  June 2012 (5 years)  Narrative: [name] here for follow up after treatment for endometrial cancer. She denies having pain. She reports having occasional nausea and takes phenergan as needed. She reports having constipation that comes and goes. She had a bowel movement this morning and is taking Miralax. She denies having any vaginal/rectal bleeding or discharge. She reports her energy level is OK. She continues to work full time. She is using her vaginal dilator occasionally. Her bp is high today at 171/80.  ALLERGIES:  is allergic to phenobarbital.  Meds: Current Outpatient Prescriptions  Medication Sig Dispense Refill  . aspirin 325 MG tablet Take 325 mg by mouth daily.    Marland Kitchen atorvastatin (LIPITOR) 40 MG tablet TK 1 T PO QHS  11  . benazepril (LOTENSIN) 20 MG tablet Take 20 mg by mouth daily.    . calcium carbonate (OS-CAL) 600 MG TABS tablet Take 600 mg by mouth daily with breakfast.    . cetirizine (ZYRTEC) 10 MG tablet Take 10 mg by mouth daily.    . cholecalciferol (VITAMIN D) 1000 UNITS tablet Take 1,000 Units by mouth daily.    Marland Kitchen denosumab (PROLIA) 60 MG/ML SOLN injection Inject 60 mg into the skin every 6 (six) months. Administer in upper arm, thigh, or abdomen    . diclofenac sodium (VOLTAREN) 1 % GEL Apply 2 g topically daily as needed (for pain).    . fexofenadine (ALLEGRA) 180 MG tablet Take 180 mg by mouth daily.    . Folic Acid-Vit Q000111Q 123456 (FOLBEE) 2.5-25-1 MG TABS Take 1 tablet by mouth daily.    Marland Kitchen ibuprofen (ADVIL,MOTRIN) 200 MG tablet Take 200 mg by mouth every 6 (six) hours as needed.    . meclizine (ANTIVERT) 25 MG tablet Take 25 mg by mouth 4 (four) times daily as  needed for dizziness or nausea.    . Multiple Vitamin (MULTIVITAMIN) tablet Take 1 tablet by mouth daily. Centrum silver    . Omega-3 Fatty Acids (FISH OIL) 1200 MG CAPS Take 1,200 mg by mouth daily.    Vladimir Faster Glycol-Propyl Glycol (SYSTANE) 0.4-0.3 % SOLN Place 1 drop into both eyes daily as needed (for dry eyes).    . polyethylene glycol (MIRALAX / GLYCOLAX) packet Take 17 g by mouth daily.    . promethazine (PHENERGAN) 25 MG tablet Take 25 mg by mouth every 4 (four) hours as needed for nausea or vomiting.    Marland Kitchen loratadine (CLARITIN) 10 MG tablet Take 10 mg by mouth daily. Reported on 11/12/2015    . simvastatin (ZOCOR) 40 MG tablet Take 40 mg by mouth every evening. Reported on 11/12/2015     No current facility-administered medications for this encounter.    Physical Findings: The patient is in no acute distress. Patient is alert and oriented.  height is 5\' 6"  (1.676 m) and weight is 164 lb 11.2 oz (74.707 kg). Her oral temperature is 98.6 F (37 C). Her blood pressure is 171/80 and her pulse is 88. Her oxygen saturation is 100%.   The lungs are clear to auscultation. The heart has a regular rhythm and rate. The abdomen is soft and nontender with normal bowel sounds. No palpable supraclavicular, cervical,  or axillary adenopathy. No inguinal adenopathy is appreciated.  Hyperpigmentation changes noted at the area of recurrence along the left upper abdominal wall port site no palpable recurrence in this area.  On pelvic examination the external genitalia are unremarkable. A speculum exam was performed. There are radiation changes noted in the vaginal vault, but no mucosal lesions appreciated. On bimanual and rectovaginal examination there are no pelvic masses appreciated. No Pap smear was obtained. Mucosa bleeds easily in one area on the right lateral wall with manipulation but no lesions noted.  Lab Findings: Lab Results  Component Value Date   WBC 2.7* 05/20/2013   HGB 10.4* 05/20/2013    HCT 31.6* 05/20/2013   MCV 91.6 05/20/2013   PLT 220 05/20/2013    Radiographic Findings: No results found.  Impression:  Clinically no evidence of recurrence.  Plan: The patient will follow up with Dr. Skeet Latch in approximately 6 months. I discussed with the patient she does not have to follow up with radiation oncology at this time, since it has been 5 years since her treatment,  but would like to continue follow up with GYN oncology and radiation oncology on a yearly basis.  ____________________________________  Blair Promise, PhD, MD    This document serves as a record of services personally performed by Gery Pray, MD. It was created on his behalf by Lendon Collar, a trained medical scribe. The creation of this record is based on the scribe's personal observations and the provider's statements to them. This document has been checked and approved by the attending provider.

## 2015-11-13 DIAGNOSIS — Z6827 Body mass index (BMI) 27.0-27.9, adult: Secondary | ICD-10-CM | POA: Diagnosis not present

## 2015-11-13 DIAGNOSIS — F418 Other specified anxiety disorders: Secondary | ICD-10-CM | POA: Diagnosis not present

## 2015-11-13 DIAGNOSIS — I10 Essential (primary) hypertension: Secondary | ICD-10-CM | POA: Diagnosis not present

## 2015-11-26 DIAGNOSIS — Z6827 Body mass index (BMI) 27.0-27.9, adult: Secondary | ICD-10-CM | POA: Diagnosis not present

## 2015-11-26 DIAGNOSIS — I10 Essential (primary) hypertension: Secondary | ICD-10-CM | POA: Diagnosis not present

## 2015-11-26 DIAGNOSIS — N183 Chronic kidney disease, stage 3 (moderate): Secondary | ICD-10-CM | POA: Diagnosis not present

## 2015-11-26 DIAGNOSIS — F418 Other specified anxiety disorders: Secondary | ICD-10-CM | POA: Diagnosis not present

## 2016-01-02 DIAGNOSIS — Z23 Encounter for immunization: Secondary | ICD-10-CM | POA: Diagnosis not present

## 2016-01-11 ENCOUNTER — Encounter (HOSPITAL_COMMUNITY): Payer: Medicare Other

## 2016-01-12 ENCOUNTER — Encounter (HOSPITAL_COMMUNITY): Payer: Self-pay

## 2016-01-12 ENCOUNTER — Ambulatory Visit (HOSPITAL_COMMUNITY)
Admission: RE | Admit: 2016-01-12 | Discharge: 2016-01-12 | Disposition: A | Discharge status: Home / Self Care | Payer: Medicare Other | Source: Ambulatory Visit | Attending: Internal Medicine | Admitting: Internal Medicine

## 2016-01-12 ENCOUNTER — Other Ambulatory Visit (HOSPITAL_COMMUNITY): Payer: Self-pay | Admitting: Internal Medicine

## 2016-01-12 DIAGNOSIS — M810 Age-related osteoporosis without current pathological fracture: Secondary | ICD-10-CM | POA: Insufficient documentation

## 2016-01-12 MED ORDER — DENOSUMAB 60 MG/ML ~~LOC~~ SOLN
60.0000 mg | Freq: Once | SUBCUTANEOUS | Status: AC
  Administered 2016-01-12: 60 mg via SUBCUTANEOUS
  Filled 2016-01-12: qty 1

## 2016-01-12 NOTE — Discharge Instructions (Signed)
Denosumab injection  What is this medicine?  DENOSUMAB (den oh sue mab) slows bone breakdown. Prolia is used to treat osteoporosis in women after menopause and in men. Xgeva is used to prevent bone fractures and other bone problems caused by cancer bone metastases. Xgeva is also used to treat giant cell tumor of the bone.  This medicine may be used for other purposes; ask your health care provider or pharmacist if you have questions.  What should I tell my health care provider before I take this medicine?  They need to know if you have any of these conditions:  -dental disease  -eczema  -infection or history of infections  -kidney disease or on dialysis  -low blood calcium or vitamin D  -malabsorption syndrome  -scheduled to have surgery or tooth extraction  -taking medicine that contains denosumab  -thyroid or parathyroid disease  -an unusual reaction to denosumab, other medicines, foods, dyes, or preservatives  -pregnant or trying to get pregnant  -breast-feeding  How should I use this medicine?  This medicine is for injection under the skin. It is given by a health care professional in a hospital or clinic setting.  If you are getting Prolia, a special MedGuide will be given to you by the pharmacist with each prescription and refill. Be sure to read this information carefully each time.  For Prolia, talk to your pediatrician regarding the use of this medicine in children. Special care may be needed. For Xgeva, talk to your pediatrician regarding the use of this medicine in children. While this drug may be prescribed for children as young as 13 years for selected conditions, precautions do apply.  Overdosage: If you think you have taken too much of this medicine contact a poison control center or emergency room at once.  NOTE: This medicine is only for you. Do not share this medicine with others.  What if I miss a dose?  It is important not to miss your dose. Call your doctor or health care professional if you are  unable to keep an appointment.  What may interact with this medicine?  Do not take this medicine with any of the following medications:  -other medicines containing denosumab  This medicine may also interact with the following medications:  -medicines that suppress the immune system  -medicines that treat cancer  -steroid medicines like prednisone or cortisone  This list may not describe all possible interactions. Give your health care provider a list of all the medicines, herbs, non-prescription drugs, or dietary supplements you use. Also tell them if you smoke, drink alcohol, or use illegal drugs. Some items may interact with your medicine.  What should I watch for while using this medicine?  Visit your doctor or health care professional for regular checks on your progress. Your doctor or health care professional may order blood tests and other tests to see how you are doing.  Call your doctor or health care professional if you get a cold or other infection while receiving this medicine. Do not treat yourself. This medicine may decrease your body's ability to fight infection.  You should make sure you get enough calcium and vitamin D while you are taking this medicine, unless your doctor tells you not to. Discuss the foods you eat and the vitamins you take with your health care professional.  See your dentist regularly. Brush and floss your teeth as directed. Before you have any dental work done, tell your dentist you are receiving this medicine.  Do   not become pregnant while taking this medicine or for 5 months after stopping it. Women should inform their doctor if they wish to become pregnant or think they might be pregnant. There is a potential for serious side effects to an unborn child. Talk to your health care professional or pharmacist for more information.  What side effects may I notice from receiving this medicine?  Side effects that you should report to your doctor or health care professional as soon as  possible:  -allergic reactions like skin rash, itching or hives, swelling of the face, lips, or tongue  -breathing problems  -chest pain  -fast, irregular heartbeat  -feeling faint or lightheaded, falls  -fever, chills, or any other sign of infection  -muscle spasms, tightening, or twitches  -numbness or tingling  -skin blisters or bumps, or is dry, peels, or red  -slow healing or unexplained pain in the mouth or jaw  -unusual bleeding or bruising  Side effects that usually do not require medical attention (Report these to your doctor or health care professional if they continue or are bothersome.):  -muscle pain  -stomach upset, gas  This list may not describe all possible side effects. Call your doctor for medical advice about side effects. You may report side effects to FDA at 1-800-FDA-1088.  Where should I keep my medicine?  This medicine is only given in a clinic, doctor's office, or other health care setting and will not be stored at home.  NOTE: This sheet is a summary. It may not cover all possible information. If you have questions about this medicine, talk to your doctor, pharmacist, or health care provider.      2016, Elsevier/Gold Standard. (2011-10-10 12:37:47)

## 2016-01-13 DIAGNOSIS — Z1231 Encounter for screening mammogram for malignant neoplasm of breast: Secondary | ICD-10-CM | POA: Diagnosis not present

## 2016-01-13 DIAGNOSIS — Z803 Family history of malignant neoplasm of breast: Secondary | ICD-10-CM | POA: Diagnosis not present

## 2016-03-22 DIAGNOSIS — E7800 Pure hypercholesterolemia, unspecified: Secondary | ICD-10-CM | POA: Diagnosis not present

## 2016-03-22 DIAGNOSIS — M810 Age-related osteoporosis without current pathological fracture: Secondary | ICD-10-CM | POA: Diagnosis not present

## 2016-03-22 DIAGNOSIS — E119 Type 2 diabetes mellitus without complications: Secondary | ICD-10-CM | POA: Diagnosis not present

## 2016-03-22 DIAGNOSIS — Z6824 Body mass index (BMI) 24.0-24.9, adult: Secondary | ICD-10-CM | POA: Diagnosis not present

## 2016-03-22 DIAGNOSIS — E1129 Type 2 diabetes mellitus with other diabetic kidney complication: Secondary | ICD-10-CM | POA: Diagnosis not present

## 2016-03-22 DIAGNOSIS — R05 Cough: Secondary | ICD-10-CM | POA: Diagnosis not present

## 2016-03-22 DIAGNOSIS — J0100 Acute maxillary sinusitis, unspecified: Secondary | ICD-10-CM | POA: Diagnosis not present

## 2016-03-22 DIAGNOSIS — N183 Chronic kidney disease, stage 3 (moderate): Secondary | ICD-10-CM | POA: Diagnosis not present

## 2016-03-22 DIAGNOSIS — J029 Acute pharyngitis, unspecified: Secondary | ICD-10-CM | POA: Diagnosis not present

## 2016-03-22 DIAGNOSIS — J302 Other seasonal allergic rhinitis: Secondary | ICD-10-CM | POA: Diagnosis not present

## 2016-03-22 DIAGNOSIS — R509 Fever, unspecified: Secondary | ICD-10-CM | POA: Diagnosis not present

## 2016-03-22 NOTE — Progress Notes (Addendum)
Office Visit:  GYN ONCOLOGY  CHIEF COMPLAINT: Surveillance for  endometrial cancer.   ASSESSMENT/PLAN This is a 75 y.o.with stage IIIA grade 1endometrial cancer treated on GOG protocol 258.  He was randomized to the arm that received 6 cycles of Taxol carboplatin therapy. Recurrent disease was identified at a laparoscopic port site, pelvic lymph nodes and in the vagina in 2011.  She received additional Taxol carboplatin therapy and adjuvant external beam and vaginal brachytherapy. This treatment was completed in June 2012 and she's been without any evidence of disease since.  Follow-up in 1 year   HISTORY OF PRESENT ILLNESS: This is a 75 y.o. , who underwent a robotic-assisted laparoscopic hysterectomy, bilateral salpingo- oophorectomy, lymph node dissection in July, 2010. Pathology was consistent with a stage III endometrial cancer with microscopic metastases to the adnexa. She was enrolled in GOG protocol 258 and was randomized to receive 6 cycles of Taxol and carboplatin. Posttreatment CT scan in January 2011 was WNl. At the end of 2011, on routine physical examination, she was found to have  vaginal recurrence. CT imaging at that time confirmed the presence of an obturator mass at the pelvic port site in the left upper quadrant and enlarged obturator nodes. She  received 6 cycles of carboplatin and Taxol therapy. Imaging demonstrated residual left obturator and inguinal adenopathy and residual soft tissue mass and there was still residual disease in the vagina. She subsequently underwent external beam pelvic radiotherapy along with vaginal brachytherapy and treatment of the port sites. Treatment was completed in June, 2012.   Patient has had two episodes of SBO since pelvic radiotherapy, most recently in January 2015. These were managed with conservative therapy.  PET 01/2013 (at the time of an SBO): without evidence of metastatic disease.  She saw Dr Sondra Come for routine surveillance in July  23rd, 2015. Pap at that visit showed atypical glandular cells with endometrioid phenotype. 11/2013 colposcopy and biopsy   Vagina, biopsy, right - SUPERFICIAL FRAGMENTS OF BENIGN SQUAMOUS EPITHELIUM. - NO MALIGNANCY IDENTIFIED, SEE COMMENT.  Pap 08/2015 with radiation changes  Interval History Patient feels very well. Incontinence is improved.  Did not seek consultation with Gyn Urology  Past Medical History:  Diagnosis Date  . Allergy   . Benign positional vertigo   . Bursitis of shoulder, left   . Cancer [facility])    Endometrial ca/Recurrence  . CKD (chronic kidney disease), stage III   . History of colonic polyps   . Hypercholesteremia   . Hypercholesterolemia   . Hypertension   . Lumbar pain   . Osteoarthritis   . Osteoporosis   . S/P radiation therapy July 29, 2010-Sep 06, 2010   External beam of pelvis  . S/P radiation therapy 09/15/10, 09/24/10, 09/28/2010   Intracavitary brachytherapy  . SBO (small bowel obstruction)   . Status post chemotherapy    carboplatin/paclitaxel x 6 rounds  Mammogram 12/2013 wnl   Past Surgical History:  Procedure Laterality Date  . ABDOMINAL HYSTERECTOMY    . BREAST LUMPECTOMY  1993   Left breast, benign  . ROBOTIC ASSISTED LAPAROSCOPIC VAGINAL HYSTERECTOMY WITH FIBROID REMOVAL  July 2010   bilat. salpingo-oophorectomy   SOCIAL HISTORY: Life is good and stable no family changes   REVIEW OF SYSTEMS Constitutional  Feels well , no weight lossFever chills Cardiovascular  No chest pain, shortness of breath, or edema , Reports cough for 2 weeks productive of clear sputum Pulmonary  No cough or wheeze.  Gastro Intestinal  No nausea, vomitting, or  diarrhoea. No bright red blood per rectum, no abdominal pain, change in bowel movement, or constipation.  Genito Urinary  No frequency, reports decrease in urgency with incontinence, no dysuria, no vaginal bleeding or discharge Musculo Skeletal  No myalgia, arthralgia, joint swelling or pain   Neurologic  No weakness, numbness, change in gait,  Psychology  No depression, anxiety, insomnia.     PHYSICAL EXAMINATION:  WD female in NAD VITAL SIGNS: BP (!) 145/65 (BP Location: Left Arm, Patient Position: Sitting) Comment: informed Mary  Pulse 93   Temp 98.5 F (36.9 C) (Oral)   Resp 18   Ht 5\' 5"  (1.651 m)   Wt 159 lb 11.2 oz (72.4 kg)   SpO2 100%   BMI 26.58 kg/m  CHEST: Clear to auscultation. No wheezes or rhonchi breath sounds equivalent bilaterally HEART: Regular rate and rhythm.  ABDOMEN: Soft, nontender, obese. No palpable lesions at the port sites. No evidence of hernia. No masses. No omental cake. No fluid wave.   BACK  No CVAT LN.  No axillary, cervical or supraclavicular adenopathy PELVIC: Normal external genitalia. Her vagina is very atrophic and foreshortened . With radiation changes.  No gross lesions, no cul de sac nodularity or pelvic masses. Rectovaginal exam is confirmatory. External hemmorhoids nontender  EXT 1-2+ left lower extremity edema extending to just above the knee

## 2016-03-24 ENCOUNTER — Encounter: Payer: Self-pay | Admitting: Gynecologic Oncology

## 2016-03-24 ENCOUNTER — Ambulatory Visit: Payer: Medicare Other | Attending: Gynecologic Oncology | Admitting: Gynecologic Oncology

## 2016-03-24 VITALS — BP 145/65 | HR 93 | Temp 98.5°F | Resp 18 | Ht 65.0 in | Wt 159.7 lb

## 2016-03-24 DIAGNOSIS — Z923 Personal history of irradiation: Secondary | ICD-10-CM | POA: Diagnosis not present

## 2016-03-24 DIAGNOSIS — Z8542 Personal history of malignant neoplasm of other parts of uterus: Secondary | ICD-10-CM | POA: Insufficient documentation

## 2016-03-24 DIAGNOSIS — Z86010 Personal history of colonic polyps: Secondary | ICD-10-CM | POA: Diagnosis not present

## 2016-03-24 DIAGNOSIS — Z9221 Personal history of antineoplastic chemotherapy: Secondary | ICD-10-CM | POA: Insufficient documentation

## 2016-03-24 DIAGNOSIS — N183 Chronic kidney disease, stage 3 (moderate): Secondary | ICD-10-CM | POA: Diagnosis not present

## 2016-03-24 DIAGNOSIS — M1990 Unspecified osteoarthritis, unspecified site: Secondary | ICD-10-CM | POA: Insufficient documentation

## 2016-03-24 DIAGNOSIS — I129 Hypertensive chronic kidney disease with stage 1 through stage 4 chronic kidney disease, or unspecified chronic kidney disease: Secondary | ICD-10-CM | POA: Insufficient documentation

## 2016-03-24 DIAGNOSIS — M810 Age-related osteoporosis without current pathological fracture: Secondary | ICD-10-CM | POA: Diagnosis not present

## 2016-03-24 DIAGNOSIS — E7800 Pure hypercholesterolemia, unspecified: Secondary | ICD-10-CM | POA: Diagnosis not present

## 2016-03-24 DIAGNOSIS — C549 Malignant neoplasm of corpus uteri, unspecified: Secondary | ICD-10-CM

## 2016-03-24 DIAGNOSIS — Z90722 Acquired absence of ovaries, bilateral: Secondary | ICD-10-CM | POA: Insufficient documentation

## 2016-03-24 DIAGNOSIS — Z08 Encounter for follow-up examination after completed treatment for malignant neoplasm: Secondary | ICD-10-CM | POA: Insufficient documentation

## 2016-03-24 DIAGNOSIS — Z90710 Acquired absence of both cervix and uterus: Secondary | ICD-10-CM | POA: Diagnosis not present

## 2016-03-24 NOTE — Patient Instructions (Signed)
Plan to follow up with Dr. Skeet Latch in one year or sooner if needed.  Please call us in July or August 2018 to schedule an appointment for December 2018.  Happy Holidays!

## 2016-03-29 DIAGNOSIS — I10 Essential (primary) hypertension: Secondary | ICD-10-CM | POA: Diagnosis not present

## 2016-03-29 DIAGNOSIS — R808 Other proteinuria: Secondary | ICD-10-CM | POA: Diagnosis not present

## 2016-03-29 DIAGNOSIS — Z1389 Encounter for screening for other disorder: Secondary | ICD-10-CM | POA: Diagnosis not present

## 2016-03-29 DIAGNOSIS — N183 Chronic kidney disease, stage 3 (moderate): Secondary | ICD-10-CM | POA: Diagnosis not present

## 2016-03-29 DIAGNOSIS — Z6826 Body mass index (BMI) 26.0-26.9, adult: Secondary | ICD-10-CM | POA: Diagnosis not present

## 2016-03-29 DIAGNOSIS — Z0000 Encounter for general adult medical examination without abnormal findings: Secondary | ICD-10-CM | POA: Diagnosis not present

## 2016-03-29 DIAGNOSIS — C541 Malignant neoplasm of endometrium: Secondary | ICD-10-CM | POA: Diagnosis not present

## 2016-03-29 DIAGNOSIS — S329XXS Fracture of unspecified parts of lumbosacral spine and pelvis, sequela: Secondary | ICD-10-CM | POA: Diagnosis not present

## 2016-03-29 DIAGNOSIS — F418 Other specified anxiety disorders: Secondary | ICD-10-CM | POA: Diagnosis not present

## 2016-03-29 DIAGNOSIS — J302 Other seasonal allergic rhinitis: Secondary | ICD-10-CM | POA: Diagnosis not present

## 2016-03-29 DIAGNOSIS — R7302 Impaired glucose tolerance (oral): Secondary | ICD-10-CM | POA: Diagnosis not present

## 2016-03-29 DIAGNOSIS — E7800 Pure hypercholesterolemia, unspecified: Secondary | ICD-10-CM | POA: Diagnosis not present

## 2016-07-12 ENCOUNTER — Ambulatory Visit (HOSPITAL_COMMUNITY): Payer: Medicare Other

## 2016-07-13 ENCOUNTER — Ambulatory Visit (HOSPITAL_COMMUNITY)
Admission: RE | Admit: 2016-07-13 | Discharge: 2016-07-13 | Disposition: A | Discharge status: Home / Self Care | Payer: Medicare Other | Source: Ambulatory Visit | Attending: Internal Medicine | Admitting: Internal Medicine

## 2016-07-13 ENCOUNTER — Encounter (HOSPITAL_COMMUNITY): Payer: Self-pay

## 2016-07-13 DIAGNOSIS — M810 Age-related osteoporosis without current pathological fracture: Secondary | ICD-10-CM | POA: Insufficient documentation

## 2016-07-13 MED ORDER — DENOSUMAB 60 MG/ML ~~LOC~~ SOLN
60.0000 mg | Freq: Once | SUBCUTANEOUS | Status: AC
  Administered 2016-07-13: 60 mg via SUBCUTANEOUS
  Filled 2016-07-13: qty 1

## 2016-07-13 NOTE — Discharge Instructions (Signed)
Denosumab injection °What is this medicine? °DENOSUMAB (den oh sue mab) slows bone breakdown. Prolia is used to treat osteoporosis in women after menopause and in men. Xgeva is used to treat a high calcium level due to cancer and to prevent bone fractures and other bone problems caused by multiple myeloma or cancer bone metastases. Xgeva is also used to treat giant cell tumor of the bone. °This medicine may be used for other purposes; ask your health care provider or pharmacist if you have questions. °COMMON BRAND NAME(S): Prolia, XGEVA °What should I tell my health care provider before I take this medicine? °They need to know if you have any of these conditions: °-dental disease °-having surgery or tooth extraction °-infection °-kidney disease °-low levels of calcium or Vitamin D in the blood °-malnutrition °-on hemodialysis °-skin conditions or sensitivity °-thyroid or parathyroid disease °-an unusual reaction to denosumab, other medicines, foods, dyes, or preservatives °-pregnant or trying to get pregnant °-breast-feeding °How should I use this medicine? °This medicine is for injection under the skin. It is given by a health care professional in a hospital or clinic setting. °If you are getting Prolia, a special MedGuide will be given to you by the pharmacist with each prescription and refill. Be sure to read this information carefully each time. °For Prolia, talk to your pediatrician regarding the use of this medicine in children. Special care may be needed. For Xgeva, talk to your pediatrician regarding the use of this medicine in children. While this drug may be prescribed for children as young as 13 years for selected conditions, precautions do apply. °Overdosage: If you think you have taken too much of this medicine contact a poison control center or emergency room at once. °NOTE: This medicine is only for you. Do not share this medicine with others. °What if I miss a dose? °It is important not to miss your  dose. Call your doctor or health care professional if you are unable to keep an appointment. °What may interact with this medicine? °Do not take this medicine with any of the following medications: °-other medicines containing denosumab °This medicine may also interact with the following medications: °-medicines that lower your chance of fighting infection °-steroid medicines like prednisone or cortisone °This list may not describe all possible interactions. Give your health care provider a list of all the medicines, herbs, non-prescription drugs, or dietary supplements you use. Also tell them if you smoke, drink alcohol, or use illegal drugs. Some items may interact with your medicine. °What should I watch for while using this medicine? °Visit your doctor or health care professional for regular checks on your progress. Your doctor or health care professional may order blood tests and other tests to see how you are doing. °Call your doctor or health care professional for advice if you get a fever, chills or sore throat, or other symptoms of a cold or flu. Do not treat yourself. This drug may decrease your body's ability to fight infection. Try to avoid being around people who are sick. °You should make sure you get enough calcium and vitamin D while you are taking this medicine, unless your doctor tells you not to. Discuss the foods you eat and the vitamins you take with your health care professional. °See your dentist regularly. Brush and floss your teeth as directed. Before you have any dental work done, tell your dentist you are receiving this medicine. °Do not become pregnant while taking this medicine or for 5 months after stopping   it. Talk with your doctor or health care professional about your birth control options while taking this medicine. Women should inform their doctor if they wish to become pregnant or think they might be pregnant. There is a potential for serious side effects to an unborn child. Talk  to your health care professional or pharmacist for more information. What side effects may I notice from receiving this medicine? Side effects that you should report to your doctor or health care professional as soon as possible: -allergic reactions like skin rash, itching or hives, swelling of the face, lips, or tongue -bone pain -breathing problems -dizziness -jaw pain, especially after dental work -redness, blistering, peeling of the skin -signs and symptoms of infection like fever or chills; cough; sore throat; pain or trouble passing urine -signs of low calcium like fast heartbeat, muscle cramps or muscle pain; pain, tingling, numbness in the hands or feet; seizures -unusual bleeding or bruising -unusually weak or tired Side effects that usually do not require medical attention (report to your doctor or health care professional if they continue or are bothersome): -constipation -diarrhea -headache -joint pain -loss of appetite -muscle pain -runny nose -tiredness -upset stomach This list may not describe all possible side effects. Call your doctor for medical advice about side effects. You may report side effects to FDA at 1-800-FDA-1088. Where should I keep my medicine? This medicine is only given in a clinic, doctor's office, or other health care setting and will not be stored at home. NOTE: This sheet is a summary. It may not cover all possible information. If you have questions about this medicine, talk to your doctor, pharmacist, or health care provider.  2018 Elsevier/Gold Standard (2016-05-03 19:17:21)

## 2016-08-26 DIAGNOSIS — L578 Other skin changes due to chronic exposure to nonionizing radiation: Secondary | ICD-10-CM | POA: Diagnosis not present

## 2016-08-26 DIAGNOSIS — L570 Actinic keratosis: Secondary | ICD-10-CM | POA: Diagnosis not present

## 2016-08-26 DIAGNOSIS — L814 Other melanin hyperpigmentation: Secondary | ICD-10-CM | POA: Diagnosis not present

## 2016-08-26 DIAGNOSIS — L821 Other seborrheic keratosis: Secondary | ICD-10-CM | POA: Diagnosis not present

## 2016-09-29 DIAGNOSIS — H2513 Age-related nuclear cataract, bilateral: Secondary | ICD-10-CM | POA: Diagnosis not present

## 2016-10-05 DIAGNOSIS — F418 Other specified anxiety disorders: Secondary | ICD-10-CM | POA: Diagnosis not present

## 2016-10-05 DIAGNOSIS — M1990 Unspecified osteoarthritis, unspecified site: Secondary | ICD-10-CM | POA: Diagnosis not present

## 2016-10-05 DIAGNOSIS — R809 Proteinuria, unspecified: Secondary | ICD-10-CM | POA: Diagnosis not present

## 2016-10-05 DIAGNOSIS — C541 Malignant neoplasm of endometrium: Secondary | ICD-10-CM | POA: Diagnosis not present

## 2016-10-05 DIAGNOSIS — J302 Other seasonal allergic rhinitis: Secondary | ICD-10-CM | POA: Diagnosis not present

## 2016-10-05 DIAGNOSIS — E7800 Pure hypercholesterolemia, unspecified: Secondary | ICD-10-CM | POA: Diagnosis not present

## 2016-10-05 DIAGNOSIS — N183 Chronic kidney disease, stage 3 (moderate): Secondary | ICD-10-CM | POA: Diagnosis not present

## 2016-10-05 DIAGNOSIS — I10 Essential (primary) hypertension: Secondary | ICD-10-CM | POA: Diagnosis not present

## 2016-10-05 DIAGNOSIS — M810 Age-related osteoporosis without current pathological fracture: Secondary | ICD-10-CM | POA: Diagnosis not present

## 2016-10-05 DIAGNOSIS — Z6827 Body mass index (BMI) 27.0-27.9, adult: Secondary | ICD-10-CM | POA: Diagnosis not present

## 2016-10-05 DIAGNOSIS — E1122 Type 2 diabetes mellitus with diabetic chronic kidney disease: Secondary | ICD-10-CM | POA: Diagnosis not present

## 2016-11-17 ENCOUNTER — Encounter: Payer: Self-pay | Admitting: Radiation Oncology

## 2016-11-17 ENCOUNTER — Other Ambulatory Visit (HOSPITAL_COMMUNITY)
Admission: RE | Admit: 2016-11-17 | Discharge: 2016-11-17 | Disposition: A | Discharge status: Home / Self Care | Payer: Medicare Other | Source: Ambulatory Visit | Attending: Radiation Oncology | Admitting: Radiation Oncology

## 2016-11-17 ENCOUNTER — Ambulatory Visit
Admission: RE | Admit: 2016-11-17 | Discharge: 2016-11-17 | Disposition: A | Discharge status: Home / Self Care | Payer: Medicare Other | Source: Ambulatory Visit | Attending: Radiation Oncology | Admitting: Radiation Oncology

## 2016-11-17 DIAGNOSIS — Z888 Allergy status to other drugs, medicaments and biological substances status: Secondary | ICD-10-CM | POA: Insufficient documentation

## 2016-11-17 DIAGNOSIS — R32 Unspecified urinary incontinence: Secondary | ICD-10-CM | POA: Diagnosis not present

## 2016-11-17 DIAGNOSIS — Z79899 Other long term (current) drug therapy: Secondary | ICD-10-CM | POA: Diagnosis not present

## 2016-11-17 DIAGNOSIS — Z9889 Other specified postprocedural states: Secondary | ICD-10-CM | POA: Diagnosis not present

## 2016-11-17 DIAGNOSIS — Z8542 Personal history of malignant neoplasm of other parts of uterus: Secondary | ICD-10-CM | POA: Insufficient documentation

## 2016-11-17 DIAGNOSIS — Z01411 Encounter for gynecological examination (general) (routine) with abnormal findings: Secondary | ICD-10-CM | POA: Diagnosis not present

## 2016-11-17 DIAGNOSIS — Z7982 Long term (current) use of aspirin: Secondary | ICD-10-CM | POA: Diagnosis not present

## 2016-11-17 DIAGNOSIS — Z923 Personal history of irradiation: Secondary | ICD-10-CM | POA: Diagnosis not present

## 2016-11-17 DIAGNOSIS — C549 Malignant neoplasm of corpus uteri, unspecified: Secondary | ICD-10-CM

## 2016-11-17 DIAGNOSIS — Z08 Encounter for follow-up examination after completed treatment for malignant neoplasm: Secondary | ICD-10-CM | POA: Diagnosis not present

## 2016-11-17 NOTE — Progress Notes (Signed)
[name] is here for follow up.  She denies having any pain.  She continues to have urinary incontinence.  She reports having occasional constipation and takes Miralax as needed.  She denies having any nausea.  She also denies having vaginal bleeding or discharge.  She reports having a good appetite and energy level.  She is working full time.    BP (!) 166/82 (BP Location: Right Arm, Patient Position: Sitting)   Pulse 82   Temp 98.4 F (36.9 C) (Oral)   Ht 5\' 5"  (1.651 m)   Wt 162 lb 6.4 oz (73.7 kg)   BMI 27.02 kg/m    Wt Readings from Last 3 Encounters:  11/17/16 162 lb 6.4 oz (73.7 kg)  07/13/16 164 lb 6.4 oz (74.6 kg)  03/24/16 159 lb 11.2 oz (72.4 kg)

## 2016-11-17 NOTE — Progress Notes (Signed)
Radiation Oncology         [phone] ________________________________  Name: [name] MRN: [id number]  Date: 11/17/2016  DOB: 1940/09/19  Follow-Up Visit Note  CC: Tisovec, Fransico Him, MD  Nancy Marus, MD  Diagnosis:   Recurrent endometrial cancer  Interval Since Last Radiation:  June 2012 (6 years)  Narrative: Kelie Gainey here for follow up after treatment for endometrial cancer. She denies having any pain. She continues to have urinary incontinence. No worse. She reports having occasional constipation and takes Miralax as needed. She denies having any nausea. She also denies having vaginal bleeding or discharge. She reports having a good appetite and energy level. She is working full time. She reports occasional swelling in her feet that is worse at night and typically worse with the left foot. She tries to keep her feet elevated as much as she can during the day. She has not scheduled for follow up with Dr. Skeet Latch.   ALLERGIES:  is allergic to phenobarbital.  Meds: Current Outpatient Prescriptions  Medication Sig Dispense Refill  . aspirin 325 MG tablet Take 325 mg by mouth daily.    Marland Kitchen atorvastatin (LIPITOR) 40 MG tablet TK 1 T PO QHS  11  . benazepril (LOTENSIN) 20 MG tablet Take 20 mg by mouth 2 (two) times daily.     . calcium carbonate (OS-CAL) 600 MG TABS tablet Take 600 mg by mouth daily with breakfast.    . cetirizine (ZYRTEC) 10 MG tablet Take 10 mg by mouth daily.    . cholecalciferol (VITAMIN D) 1000 UNITS tablet Take 5,000 Units by mouth daily.     Marland Kitchen denosumab (PROLIA) 60 MG/ML SOLN injection Inject 60 mg into the skin every 6 (six) months. Administer in upper arm, thigh, or abdomen    . diclofenac sodium (VOLTAREN) 1 % GEL Apply 2 g topically daily as needed (for pain).    . fexofenadine (ALLEGRA) 180 MG tablet Take 180 mg by mouth daily.    . Folic Acid-Vit Z3-GUY Q03 (FOLBEE) 2.5-25-1 MG TABS Take 1 tablet by mouth daily.    Marland Kitchen ibuprofen (ADVIL,MOTRIN)  200 MG tablet Take 200 mg by mouth every 6 (six) hours as needed.    . loratadine (CLARITIN) 10 MG tablet Take 10 mg by mouth daily. Reported on 11/12/2015    . meclizine (ANTIVERT) 25 MG tablet Take 25 mg by mouth 4 (four) times daily as needed for dizziness or nausea.    . Multiple Vitamin (MULTIVITAMIN) tablet Take 1 tablet by mouth daily. Centrum silver    . Omega-3 Fatty Acids (FISH OIL) 1200 MG CAPS Take 1,200 mg by mouth daily.    Vladimir Faster Glycol-Propyl Glycol (SYSTANE) 0.4-0.3 % SOLN Place 1 drop into both eyes daily as needed (for dry eyes).    . polyethylene glycol (MIRALAX / GLYCOLAX) packet Take 17 g by mouth daily.    . promethazine (PHENERGAN) 25 MG tablet Take 25 mg by mouth every 4 (four) hours as needed for nausea or vomiting.    . simvastatin (ZOCOR) 40 MG tablet Take 40 mg by mouth every evening. Reported on 11/12/2015    . FLUAD 0.5 ML SUSY ADM 0.5ML IM UTD  0   No current facility-administered medications for this encounter.     Physical Findings:  height is 5\' 5"  (1.651 m) and weight is 162 lb 6.4 oz (73.7 kg). Her oral temperature is 98.4 F (36.9 C). Her blood pressure is 166/82 (abnormal) and her pulse is 82.  The patient is in no acute distress. Patient is alert and oriented. The lungs are clear to auscultation. The heart has a regular rhythm and rate. The abdomen is soft and nontender with normal bowel sounds. No palpable supraclavicular, cervical, or axillary adenopathy. No inguinal adenopathy is appreciated.  Hyperpigmentation changes noted at the area of recurrence along the left upper abdominal wall port site, no palpable recurrence in this area.  On pelvic examination the external genitalia are unremarkable. A speculum exam was performed. There are radiation changes noted in the vaginal vault, but no mucosal lesions appreciated. On bimanual and rectovaginal examination there are no pelvic masses appreciated. Mucosa bleeds easily in one area on the right lateral  proximal vaginal wall with manipulation but no lesions noted. A Pap smear was obtained of the proximal vagina. The mucosa bleeds slightly with the Pap smear.  Lab Findings: Lab Results  Component Value Date   WBC 2.7 (L) 05/20/2013   HGB 10.4 (L) 05/20/2013   HCT 31.6 (L) 05/20/2013   MCV 91.6 05/20/2013   PLT 220 05/20/2013    Radiographic Findings: No results found.  Impression:  Clinically no evidence of recurrence, Pap smear pending  Plan: We discussed she could follow up as needed In light of interval since treatment but she wishes to continue to follow up once a year.  ____________________________________  Blair Promise, PhD, MD  This document serves as a record of services personally performed by Gery Pray, MD. It was created on his behalf by Arlyce Harman, a trained medical scribe. The creation of this record is based on the scribe's personal observations and the provider's statements to them. This document has been checked and approved by the attending provider.

## 2016-11-23 LAB — CYTOLOGY - PAP

## 2016-11-25 ENCOUNTER — Telehealth: Payer: Self-pay | Admitting: Oncology

## 2016-11-25 NOTE — Telephone Encounter (Signed)
Called patient and advised her of the good results on her pap smear per Dr. Sondra Come.  She verbalized understanding and agreement.

## 2016-12-31 DIAGNOSIS — Z23 Encounter for immunization: Secondary | ICD-10-CM | POA: Diagnosis not present

## 2017-01-18 DIAGNOSIS — Z1231 Encounter for screening mammogram for malignant neoplasm of breast: Secondary | ICD-10-CM | POA: Diagnosis not present

## 2017-01-18 DIAGNOSIS — Z803 Family history of malignant neoplasm of breast: Secondary | ICD-10-CM | POA: Diagnosis not present

## 2017-01-23 ENCOUNTER — Encounter (HOSPITAL_COMMUNITY): Payer: Self-pay

## 2017-01-23 ENCOUNTER — Ambulatory Visit (HOSPITAL_COMMUNITY)
Admission: RE | Admit: 2017-01-23 | Discharge: 2017-01-23 | Disposition: A | Discharge status: Home / Self Care | Payer: Medicare Other | Source: Ambulatory Visit | Attending: Internal Medicine | Admitting: Internal Medicine

## 2017-01-23 DIAGNOSIS — M810 Age-related osteoporosis without current pathological fracture: Secondary | ICD-10-CM | POA: Insufficient documentation

## 2017-01-23 MED ORDER — DENOSUMAB 60 MG/ML ~~LOC~~ SOLN
60.0000 mg | Freq: Once | SUBCUTANEOUS | Status: AC
  Administered 2017-01-23: 60 mg via SUBCUTANEOUS
  Filled 2017-01-23: qty 1

## 2017-01-23 NOTE — Discharge Instructions (Signed)
Denosumab injection / Prolia What is this medicine? DENOSUMAB (den oh sue mab) slows bone breakdown. Prolia is used to treat osteoporosis in women after menopause and in men. Delton See is used to treat a high calcium level due to cancer and to prevent bone fractures and other bone problems caused by multiple myeloma or cancer bone metastases. Delton See is also used to treat giant cell tumor of the bone. This medicine may be used for other purposes; ask your health care provider or pharmacist if you have questions. COMMON BRAND NAME(S): Prolia, XGEVA What should I tell my health care provider before I take this medicine? They need to know if you have any of these conditions: -dental disease -having surgery or tooth extraction -infection -kidney disease -low levels of calcium or Vitamin D in the blood -malnutrition -on hemodialysis -skin conditions or sensitivity -thyroid or parathyroid disease -an unusual reaction to denosumab, other medicines, foods, dyes, or preservatives -pregnant or trying to get pregnant -breast-feeding How should I use this medicine? This medicine is for injection under the skin. It is given by a health care professional in a hospital or clinic setting. If you are getting Prolia, a special MedGuide will be given to you by the pharmacist with each prescription and refill. Be sure to read this information carefully each time. For Prolia, talk to your pediatrician regarding the use of this medicine in children. Special care may be needed. For Delton See, talk to your pediatrician regarding the use of this medicine in children. While this drug may be prescribed for children as young as 13 years for selected conditions, precautions do apply. Overdosage: If you think you have taken too much of this medicine contact a poison control center or emergency room at once. NOTE: This medicine is only for you. Do not share this medicine with others. What if I miss a dose? It is important not to  miss your dose. Call your doctor or health care professional if you are unable to keep an appointment. What may interact with this medicine? Do not take this medicine with any of the following medications: -other medicines containing denosumab This medicine may also interact with the following medications: -medicines that lower your chance of fighting infection -steroid medicines like prednisone or cortisone This list may not describe all possible interactions. Give your health care provider a list of all the medicines, herbs, non-prescription drugs, or dietary supplements you use. Also tell them if you smoke, drink alcohol, or use illegal drugs. Some items may interact with your medicine. What should I watch for while using this medicine? Visit your doctor or health care professional for regular checks on your progress. Your doctor or health care professional may order blood tests and other tests to see how you are doing. Call your doctor or health care professional for advice if you get a fever, chills or sore throat, or other symptoms of a cold or flu. Do not treat yourself. This drug may decrease your body's ability to fight infection. Try to avoid being around people who are sick. You should make sure you get enough calcium and vitamin D while you are taking this medicine, unless your doctor tells you not to. Discuss the foods you eat and the vitamins you take with your health care professional. See your dentist regularly. Brush and floss your teeth as directed. Before you have any dental work done, tell your dentist you are receiving this medicine. Do not become pregnant while taking this medicine or for 5 months  after stopping it. Talk with your doctor or health care professional about your birth control options while taking this medicine. Women should inform their doctor if they wish to become pregnant or think they might be pregnant. There is a potential for serious side effects to an unborn  child. Talk to your health care professional or pharmacist for more information. What side effects may I notice from receiving this medicine? Side effects that you should report to your doctor or health care professional as soon as possible: -allergic reactions like skin rash, itching or hives, swelling of the face, lips, or tongue -bone pain -breathing problems -dizziness -jaw pain, especially after dental work -redness, blistering, peeling of the skin -signs and symptoms of infection like fever or chills; cough; sore throat; pain or trouble passing urine -signs of low calcium like fast heartbeat, muscle cramps or muscle pain; pain, tingling, numbness in the hands or feet; seizures -unusual bleeding or bruising -unusually weak or tired Side effects that usually do not require medical attention (report to your doctor or health care professional if they continue or are bothersome): -constipation -diarrhea -headache -joint pain -loss of appetite -muscle pain -runny nose -tiredness -upset stomach This list may not describe all possible side effects. Call your doctor for medical advice about side effects. You may report side effects to FDA at 1-800-FDA-1088. Where should I keep my medicine? This medicine is only given in a clinic, doctor's office, or other health care setting and will not be stored at home. NOTE: This sheet is a summary. It may not cover all possible information. If you have questions about this medicine, talk to your doctor, pharmacist, or health care provider.  2018 Elsevier/Gold Standard (2016-05-03 19:17:21)

## 2017-03-23 ENCOUNTER — Ambulatory Visit: Payer: Medicare Other | Attending: Gynecologic Oncology | Admitting: Gynecologic Oncology

## 2017-03-23 ENCOUNTER — Encounter: Payer: Self-pay | Admitting: Gynecologic Oncology

## 2017-03-23 VITALS — BP 160/84 | HR 85 | Temp 97.5°F | Resp 20 | Ht 66.0 in | Wt 162.5 lb

## 2017-03-23 DIAGNOSIS — N898 Other specified noninflammatory disorders of vagina: Secondary | ICD-10-CM | POA: Diagnosis not present

## 2017-03-23 DIAGNOSIS — N183 Chronic kidney disease, stage 3 (moderate): Secondary | ICD-10-CM | POA: Insufficient documentation

## 2017-03-23 DIAGNOSIS — Z9221 Personal history of antineoplastic chemotherapy: Secondary | ICD-10-CM

## 2017-03-23 DIAGNOSIS — E7800 Pure hypercholesterolemia, unspecified: Secondary | ICD-10-CM | POA: Diagnosis not present

## 2017-03-23 DIAGNOSIS — I129 Hypertensive chronic kidney disease with stage 1 through stage 4 chronic kidney disease, or unspecified chronic kidney disease: Secondary | ICD-10-CM | POA: Diagnosis not present

## 2017-03-23 DIAGNOSIS — K56609 Unspecified intestinal obstruction, unspecified as to partial versus complete obstruction: Secondary | ICD-10-CM | POA: Diagnosis not present

## 2017-03-23 DIAGNOSIS — Z8542 Personal history of malignant neoplasm of other parts of uterus: Secondary | ICD-10-CM | POA: Diagnosis not present

## 2017-03-23 DIAGNOSIS — D1809 Hemangioma of other sites: Secondary | ICD-10-CM | POA: Diagnosis not present

## 2017-03-23 DIAGNOSIS — C549 Malignant neoplasm of corpus uteri, unspecified: Secondary | ICD-10-CM

## 2017-03-23 NOTE — Patient Instructions (Signed)
We will call you with the results of your biopsy from today.  You may have some vaginal spotting after the procedure but it should resolve.  Plan on following up in one year or sooner if needed.  If you will please call our office at [phone] in Aug or Sept 2019 for an appointment in late November or early Dec 2019 with Dr. Skeet Latch.  Please call for any needs.

## 2017-03-23 NOTE — Progress Notes (Signed)
Office Visit:  GYN ONCOLOGY  CHIEF COMPLAINT: Surveillance for  endometrial cancer.   ASSESSMENT/PLAN This is a 76 y.o.with stage IIIA grade 1endometrial cancer treated on GOG protocol 258.  He was randomized to the arm that received 6 cycles of Taxol carboplatin therapy. Recurrent disease was identified at a laparoscopic port site, pelvic lymph nodes and in the vagina in 2011.  She received additional Taxol carboplatin therapy and adjuvant external beam and vaginal brachytherapy. This treatment was completed in June 2012 and she's been without any evidence of disease since.  Friable vaginal lesion 3 millimeter mass at the right vaginal apex removed. Likely granulation tissue Will contact patient with pathology evaluation  Follow-up in 1 year   HISTORY OF PRESENT ILLNESS: This is a 76 y.o. , who underwent a robotic-assisted laparoscopic hysterectomy, bilateral salpingo- oophorectomy, lymph node dissection in July, 2010. Pathology was consistent with a stage III endometrial cancer with microscopic metastases to the adnexa. She was enrolled in GOG protocol 258 and was randomized to receive 6 cycles of Taxol and carboplatin. Posttreatment CT scan in January 2011 was WNl. At the end of 2011, on routine physical examination, she was found to have  vaginal recurrence. CT imaging at that time confirmed the presence of an obturator mass at the pelvic port site in the left upper quadrant and enlarged obturator nodes. She  received 6 cycles of carboplatin and Taxol therapy. Imaging demonstrated residual left obturator and inguinal adenopathy and residual soft tissue mass and there was still residual disease in the vagina. She subsequently underwent external beam pelvic radiotherapy along with vaginal brachytherapy and treatment of the port sites. Treatment was completed in June, 2012.   Patient has had two episodes of SBO since pelvic radiotherapy, most recently in January 2015. These were managed with  conservative therapy.  PET 01/2013 (at the time of an SBO): without evidence of metastatic disease.  She saw Dr Sondra Come for routine surveillance in July 23rd, 2015. Pap at that visit showed atypical glandular cells with endometrioid phenotype. 11/2013 colposcopy and biopsy   Vagina, biopsy, right - SUPERFICIAL FRAGMENTS OF BENIGN SQUAMOUS EPITHELIUM. - NO MALIGNANCY IDENTIFIED, SEE COMMENT.  Pap 08/2015 with radiation changes  Interval History Patient feels very well. Incontinence is improved.  Did not seek consultation with Gyn Urology  Past Medical History:  Diagnosis Date  . Allergy   . Benign positional vertigo   . Bursitis of shoulder, left   . Cancer [facility])    Endometrial ca/Recurrence  . CKD (chronic kidney disease), stage III ([place])   . History of colonic polyps   . Hypercholesteremia   . Hypercholesterolemia   . Hypertension   . Lumbar pain   . Osteoarthritis   . Osteoporosis   . S/P radiation therapy July 29, 2010-Sep 06, 2010   External beam of pelvis  . S/P radiation therapy 09/15/10, 09/24/10, 09/28/2010   Intracavitary brachytherapy  . SBO (small bowel obstruction) ([place])   . Status post chemotherapy    carboplatin/paclitaxel x 6 rounds  Mammogram 12/2013 wnl   Past Surgical History:  Procedure Laterality Date  . ABDOMINAL HYSTERECTOMY    . BREAST LUMPECTOMY  1993   Left breast, benign  . ROBOTIC ASSISTED LAPAROSCOPIC VAGINAL HYSTERECTOMY WITH FIBROID REMOVAL  July 2010   bilat. salpingo-oophorectomy   SOCIAL HISTORY: Life is good and stable no family changes.  Her daughter is considering becoming a 37 assistant  REVIEW OF SYSTEMS Constitutional  Feels well , no weight loss Cardiovascular  No  chest pain, shortness of breath, or edema  Pulmonary  No cough or wheeze.  Gastro Intestinal  No nausea, vomitting, or diarrhoea. No bright red blood per rectum, no abdominal pain, change in bowel movement, or constipation.  Genito Urinary  No frequency,  reports decrease in urgency with incontinence, no dysuria, no vaginal bleeding or discharge Musculo Skeletal  No myalgia, arthralgia, joint swelling or pain  Neurologic  No weakness, numbness, change in gait,  Psychology  No depression, anxiety, insomnia.   Skin Multiple new actinic keratotic lesions  PHYSICAL EXAMINATION:  WD female in NAD VITAL SIGNS: BP (!) 160/84 (Patient Position: Sitting) Comment: RN notified   taken manual  Pulse 85   Temp (!) 97.5 F (36.4 C) (Oral)   Resp 20   Ht 5\' 6"  (1.676 m)   Wt 162 lb 8 oz (73.7 kg)   SpO2 99%   BMI 26.23 kg/m  CHEST: Clear to auscultation. No wheezes or rhonchi breath sounds equivalent bilaterally HEART: Regular rate and rhythm.  ABDOMEN: Soft, nontender, obese. No palpable lesions at the port sites. No evidence of hernia. No masses. No omental cake. No fluid wave.   BACK  No CVAT LN.  No axillary, cervical or supraclavicular adenopathy PELVIC: Normal external genitalia. Her vagina is very atrophic and foreshortened . With radiation changes ? friable lesion on the right vaginal apex 45mm Removed and sent to pathology.  No gross lesions, no cul de sac nodularity or pelvic masses. Rectovaginal exam is confirmatory. External hemmorhoids nontender  EXT 1-2+ left lower extremity edema extending to just above the knee  VAGINAL BIOPSY  Procedure explained to patient.  The lesion at the right lateral aspect of the vaginal apex was removed sharply.   There was minimal bleeding that was  controlled with silver nitrate and pressure for 1 minutes.  . Patient tolerated the procedure well.

## 2017-03-24 DIAGNOSIS — M810 Age-related osteoporosis without current pathological fracture: Secondary | ICD-10-CM | POA: Diagnosis not present

## 2017-03-24 DIAGNOSIS — E7800 Pure hypercholesterolemia, unspecified: Secondary | ICD-10-CM | POA: Diagnosis not present

## 2017-03-24 DIAGNOSIS — R82998 Other abnormal findings in urine: Secondary | ICD-10-CM | POA: Diagnosis not present

## 2017-03-24 DIAGNOSIS — N183 Chronic kidney disease, stage 3 (moderate): Secondary | ICD-10-CM | POA: Diagnosis not present

## 2017-03-24 DIAGNOSIS — E1129 Type 2 diabetes mellitus with other diabetic kidney complication: Secondary | ICD-10-CM | POA: Diagnosis not present

## 2017-03-27 ENCOUNTER — Telehealth: Payer: Self-pay | Admitting: Gynecologic Oncology

## 2017-03-27 NOTE — Telephone Encounter (Signed)
Patient advised of biopsy results.  No concerns voiced.

## 2017-03-31 DIAGNOSIS — Z0000 Encounter for general adult medical examination without abnormal findings: Secondary | ICD-10-CM | POA: Diagnosis not present

## 2017-03-31 DIAGNOSIS — C541 Malignant neoplasm of endometrium: Secondary | ICD-10-CM | POA: Diagnosis not present

## 2017-03-31 DIAGNOSIS — S329XXS Fracture of unspecified parts of lumbosacral spine and pelvis, sequela: Secondary | ICD-10-CM | POA: Diagnosis not present

## 2017-03-31 DIAGNOSIS — N183 Chronic kidney disease, stage 3 (moderate): Secondary | ICD-10-CM | POA: Diagnosis not present

## 2017-03-31 DIAGNOSIS — E7800 Pure hypercholesterolemia, unspecified: Secondary | ICD-10-CM | POA: Diagnosis not present

## 2017-03-31 DIAGNOSIS — Z6827 Body mass index (BMI) 27.0-27.9, adult: Secondary | ICD-10-CM | POA: Diagnosis not present

## 2017-03-31 DIAGNOSIS — E119 Type 2 diabetes mellitus without complications: Secondary | ICD-10-CM | POA: Diagnosis not present

## 2017-03-31 DIAGNOSIS — J302 Other seasonal allergic rhinitis: Secondary | ICD-10-CM | POA: Diagnosis not present

## 2017-03-31 DIAGNOSIS — F418 Other specified anxiety disorders: Secondary | ICD-10-CM | POA: Diagnosis not present

## 2017-03-31 DIAGNOSIS — M810 Age-related osteoporosis without current pathological fracture: Secondary | ICD-10-CM | POA: Diagnosis not present

## 2017-03-31 DIAGNOSIS — R809 Proteinuria, unspecified: Secondary | ICD-10-CM | POA: Diagnosis not present

## 2017-03-31 DIAGNOSIS — Z1389 Encounter for screening for other disorder: Secondary | ICD-10-CM | POA: Diagnosis not present

## 2017-06-05 DIAGNOSIS — R05 Cough: Secondary | ICD-10-CM | POA: Diagnosis not present

## 2017-06-05 DIAGNOSIS — E119 Type 2 diabetes mellitus without complications: Secondary | ICD-10-CM | POA: Diagnosis not present

## 2017-06-05 DIAGNOSIS — J209 Acute bronchitis, unspecified: Secondary | ICD-10-CM | POA: Diagnosis not present

## 2017-06-05 DIAGNOSIS — Z6827 Body mass index (BMI) 27.0-27.9, adult: Secondary | ICD-10-CM | POA: Diagnosis not present

## 2017-06-05 DIAGNOSIS — J302 Other seasonal allergic rhinitis: Secondary | ICD-10-CM | POA: Diagnosis not present

## 2017-06-05 DIAGNOSIS — I10 Essential (primary) hypertension: Secondary | ICD-10-CM | POA: Diagnosis not present

## 2017-07-11 DIAGNOSIS — L309 Dermatitis, unspecified: Secondary | ICD-10-CM | POA: Diagnosis not present

## 2017-07-11 DIAGNOSIS — Z6827 Body mass index (BMI) 27.0-27.9, adult: Secondary | ICD-10-CM | POA: Diagnosis not present

## 2017-07-25 ENCOUNTER — Encounter (HOSPITAL_COMMUNITY): Payer: Self-pay

## 2017-07-25 ENCOUNTER — Encounter (HOSPITAL_COMMUNITY)
Admission: RE | Admit: 2017-07-25 | Discharge: 2017-07-25 | Disposition: A | Discharge status: Home / Self Care | Payer: Medicare Other | Source: Ambulatory Visit | Attending: Internal Medicine | Admitting: Internal Medicine

## 2017-07-25 DIAGNOSIS — M810 Age-related osteoporosis without current pathological fracture: Secondary | ICD-10-CM | POA: Diagnosis not present

## 2017-07-25 MED ORDER — DENOSUMAB 60 MG/ML ~~LOC~~ SOLN
60.0000 mg | Freq: Once | SUBCUTANEOUS | Status: AC
  Administered 2017-07-25: 60 mg via SUBCUTANEOUS
  Filled 2017-07-25: qty 1

## 2017-07-25 NOTE — Discharge Instructions (Signed)
Denosumab injection / Prolia What is this medicine? DENOSUMAB (den oh sue mab) slows bone breakdown. Prolia is used to treat osteoporosis in women after menopause and in men. Delton See is used to treat a high calcium level due to cancer and to prevent bone fractures and other bone problems caused by multiple myeloma or cancer bone metastases. Delton See is also used to treat giant cell tumor of the bone. This medicine may be used for other purposes; ask your health care provider or pharmacist if you have questions. COMMON BRAND NAME(S): Prolia, XGEVA What should I tell my health care provider before I take this medicine? They need to know if you have any of these conditions: -dental disease -having surgery or tooth extraction -infection -kidney disease -low levels of calcium or Vitamin D in the blood -malnutrition -on hemodialysis -skin conditions or sensitivity -thyroid or parathyroid disease -an unusual reaction to denosumab, other medicines, foods, dyes, or preservatives -pregnant or trying to get pregnant -breast-feeding How should I use this medicine? This medicine is for injection under the skin. It is given by a health care professional in a hospital or clinic setting. If you are getting Prolia, a special MedGuide will be given to you by the pharmacist with each prescription and refill. Be sure to read this information carefully each time. For Prolia, talk to your pediatrician regarding the use of this medicine in children. Special care may be needed. For Delton See, talk to your pediatrician regarding the use of this medicine in children. While this drug may be prescribed for children as young as 13 years for selected conditions, precautions do apply. Overdosage: If you think you have taken too much of this medicine contact a poison control center or emergency room at once. NOTE: This medicine is only for you. Do not share this medicine with others. What if I miss a dose? It is important not to  miss your dose. Call your doctor or health care professional if you are unable to keep an appointment. What may interact with this medicine? Do not take this medicine with any of the following medications: -other medicines containing denosumab This medicine may also interact with the following medications: -medicines that lower your chance of fighting infection -steroid medicines like prednisone or cortisone This list may not describe all possible interactions. Give your health care provider a list of all the medicines, herbs, non-prescription drugs, or dietary supplements you use. Also tell them if you smoke, drink alcohol, or use illegal drugs. Some items may interact with your medicine. What should I watch for while using this medicine? Visit your doctor or health care professional for regular checks on your progress. Your doctor or health care professional may order blood tests and other tests to see how you are doing. Call your doctor or health care professional for advice if you get a fever, chills or sore throat, or other symptoms of a cold or flu. Do not treat yourself. This drug may decrease your body's ability to fight infection. Try to avoid being around people who are sick. You should make sure you get enough calcium and vitamin D while you are taking this medicine, unless your doctor tells you not to. Discuss the foods you eat and the vitamins you take with your health care professional. See your dentist regularly. Brush and floss your teeth as directed. Before you have any dental work done, tell your dentist you are receiving this medicine. Do not become pregnant while taking this medicine or for 5 months  after stopping it. Talk with your doctor or health care professional about your birth control options while taking this medicine. Women should inform their doctor if they wish to become pregnant or think they might be pregnant. There is a potential for serious side effects to an unborn  child. Talk to your health care professional or pharmacist for more information. What side effects may I notice from receiving this medicine? Side effects that you should report to your doctor or health care professional as soon as possible: -allergic reactions like skin rash, itching or hives, swelling of the face, lips, or tongue -bone pain -breathing problems -dizziness -jaw pain, especially after dental work -redness, blistering, peeling of the skin -signs and symptoms of infection like fever or chills; cough; sore throat; pain or trouble passing urine -signs of low calcium like fast heartbeat, muscle cramps or muscle pain; pain, tingling, numbness in the hands or feet; seizures -unusual bleeding or bruising -unusually weak or tired Side effects that usually do not require medical attention (report to your doctor or health care professional if they continue or are bothersome): -constipation -diarrhea -headache -joint pain -loss of appetite -muscle pain -runny nose -tiredness -upset stomach This list may not describe all possible side effects. Call your doctor for medical advice about side effects. You may report side effects to FDA at 1-800-FDA-1088. Where should I keep my medicine? This medicine is only given in a clinic, doctor's office, or other health care setting and will not be stored at home. NOTE: This sheet is a summary. It may not cover all possible information. If you have questions about this medicine, talk to your doctor, pharmacist, or health care provider.  2018 Elsevier/Gold Standard (2016-05-03 19:17:21)

## 2017-07-27 DIAGNOSIS — L309 Dermatitis, unspecified: Secondary | ICD-10-CM | POA: Diagnosis not present

## 2017-07-27 DIAGNOSIS — M25572 Pain in left ankle and joints of left foot: Secondary | ICD-10-CM | POA: Diagnosis not present

## 2017-07-27 DIAGNOSIS — N183 Chronic kidney disease, stage 3 (moderate): Secondary | ICD-10-CM | POA: Diagnosis not present

## 2017-07-27 DIAGNOSIS — Z6827 Body mass index (BMI) 27.0-27.9, adult: Secondary | ICD-10-CM | POA: Diagnosis not present

## 2017-07-27 DIAGNOSIS — I10 Essential (primary) hypertension: Secondary | ICD-10-CM | POA: Diagnosis not present

## 2017-07-27 DIAGNOSIS — E119 Type 2 diabetes mellitus without complications: Secondary | ICD-10-CM | POA: Diagnosis not present

## 2017-07-31 ENCOUNTER — Other Ambulatory Visit (HOSPITAL_COMMUNITY): Payer: Self-pay | Admitting: Internal Medicine

## 2017-07-31 DIAGNOSIS — I872 Venous insufficiency (chronic) (peripheral): Secondary | ICD-10-CM | POA: Diagnosis not present

## 2017-07-31 DIAGNOSIS — I8312 Varicose veins of left lower extremity with inflammation: Secondary | ICD-10-CM | POA: Diagnosis not present

## 2017-07-31 DIAGNOSIS — R52 Pain, unspecified: Secondary | ICD-10-CM

## 2017-07-31 DIAGNOSIS — L300 Nummular dermatitis: Secondary | ICD-10-CM | POA: Diagnosis not present

## 2017-07-31 DIAGNOSIS — I8311 Varicose veins of right lower extremity with inflammation: Secondary | ICD-10-CM | POA: Diagnosis not present

## 2017-08-01 ENCOUNTER — Ambulatory Visit (HOSPITAL_COMMUNITY)
Admission: RE | Admit: 2017-08-01 | Discharge: 2017-08-01 | Disposition: A | Discharge status: Home / Self Care | Payer: Medicare Other | Source: Ambulatory Visit | Attending: Vascular Surgery | Admitting: Vascular Surgery

## 2017-08-01 DIAGNOSIS — R52 Pain, unspecified: Secondary | ICD-10-CM | POA: Insufficient documentation

## 2017-09-14 DIAGNOSIS — I872 Venous insufficiency (chronic) (peripheral): Secondary | ICD-10-CM | POA: Diagnosis not present

## 2017-09-14 DIAGNOSIS — L821 Other seborrheic keratosis: Secondary | ICD-10-CM | POA: Diagnosis not present

## 2017-09-14 DIAGNOSIS — L720 Epidermal cyst: Secondary | ICD-10-CM | POA: Diagnosis not present

## 2017-09-14 DIAGNOSIS — L578 Other skin changes due to chronic exposure to nonionizing radiation: Secondary | ICD-10-CM | POA: Diagnosis not present

## 2017-09-14 DIAGNOSIS — D1801 Hemangioma of skin and subcutaneous tissue: Secondary | ICD-10-CM | POA: Diagnosis not present

## 2017-10-05 DIAGNOSIS — R808 Other proteinuria: Secondary | ICD-10-CM | POA: Diagnosis not present

## 2017-10-05 DIAGNOSIS — E119 Type 2 diabetes mellitus without complications: Secondary | ICD-10-CM | POA: Diagnosis not present

## 2017-10-05 DIAGNOSIS — N183 Chronic kidney disease, stage 3 (moderate): Secondary | ICD-10-CM | POA: Diagnosis not present

## 2017-10-05 DIAGNOSIS — M810 Age-related osteoporosis without current pathological fracture: Secondary | ICD-10-CM | POA: Diagnosis not present

## 2017-10-05 DIAGNOSIS — E7800 Pure hypercholesterolemia, unspecified: Secondary | ICD-10-CM | POA: Diagnosis not present

## 2017-10-05 DIAGNOSIS — S329XXS Fracture of unspecified parts of lumbosacral spine and pelvis, sequela: Secondary | ICD-10-CM | POA: Diagnosis not present

## 2017-10-05 DIAGNOSIS — I10 Essential (primary) hypertension: Secondary | ICD-10-CM | POA: Diagnosis not present

## 2017-10-05 DIAGNOSIS — Z6827 Body mass index (BMI) 27.0-27.9, adult: Secondary | ICD-10-CM | POA: Diagnosis not present

## 2017-10-05 DIAGNOSIS — C541 Malignant neoplasm of endometrium: Secondary | ICD-10-CM | POA: Diagnosis not present

## 2017-10-05 DIAGNOSIS — M1990 Unspecified osteoarthritis, unspecified site: Secondary | ICD-10-CM | POA: Diagnosis not present

## 2017-10-05 DIAGNOSIS — J302 Other seasonal allergic rhinitis: Secondary | ICD-10-CM | POA: Diagnosis not present

## 2017-10-05 DIAGNOSIS — F418 Other specified anxiety disorders: Secondary | ICD-10-CM | POA: Diagnosis not present

## 2017-11-16 ENCOUNTER — Ambulatory Visit: Admission: RE | Admit: 2017-11-16 | Payer: Medicare Other | Source: Ambulatory Visit | Admitting: Radiation Oncology

## 2017-11-23 ENCOUNTER — Ambulatory Visit
Admission: RE | Admit: 2017-11-23 | Discharge: 2017-11-23 | Disposition: A | Discharge status: Home / Self Care | Payer: Medicare Other | Source: Ambulatory Visit | Attending: Radiation Oncology | Admitting: Radiation Oncology

## 2017-11-23 ENCOUNTER — Other Ambulatory Visit (HOSPITAL_COMMUNITY)
Admission: RE | Admit: 2017-11-23 | Discharge: 2017-11-23 | Disposition: A | Discharge status: Home / Self Care | Payer: Medicare Other | Source: Ambulatory Visit | Attending: Radiation Oncology | Admitting: Radiation Oncology

## 2017-11-23 ENCOUNTER — Other Ambulatory Visit: Payer: Self-pay

## 2017-11-23 ENCOUNTER — Encounter: Payer: Self-pay | Admitting: Radiation Oncology

## 2017-11-23 VITALS — BP 135/71 | HR 85 | Temp 97.9°F | Resp 18 | Ht 66.0 in | Wt 160.8 lb

## 2017-11-23 DIAGNOSIS — R87629 Unspecified abnormal cytological findings in specimens from vagina: Secondary | ICD-10-CM | POA: Insufficient documentation

## 2017-11-23 DIAGNOSIS — C549 Malignant neoplasm of corpus uteri, unspecified: Secondary | ICD-10-CM

## 2017-11-23 DIAGNOSIS — C541 Malignant neoplasm of endometrium: Secondary | ICD-10-CM | POA: Insufficient documentation

## 2017-11-23 DIAGNOSIS — Z79899 Other long term (current) drug therapy: Secondary | ICD-10-CM | POA: Insufficient documentation

## 2017-11-23 DIAGNOSIS — R21 Rash and other nonspecific skin eruption: Secondary | ICD-10-CM | POA: Insufficient documentation

## 2017-11-23 DIAGNOSIS — Z08 Encounter for follow-up examination after completed treatment for malignant neoplasm: Secondary | ICD-10-CM | POA: Diagnosis not present

## 2017-11-23 DIAGNOSIS — Z7982 Long term (current) use of aspirin: Secondary | ICD-10-CM | POA: Insufficient documentation

## 2017-11-23 DIAGNOSIS — Z8542 Personal history of malignant neoplasm of other parts of uterus: Secondary | ICD-10-CM | POA: Diagnosis not present

## 2017-11-23 NOTE — Progress Notes (Signed)
Radiation Oncology         [id number]) [phone] ________________________________  Name: [name] MRN: [id number]  Date: 11/23/2017  DOB: Sep 15, 1940  Follow-Up Visit Note  CC: Tisovec, Fransico Him, MD  Nancy Marus, MD  Diagnosis:   Recurrent endometrial cancer  Interval Since Last Radiation:  ~7 years, June - December 2012  Narrative: [name] is here for follow up after treatment for endometrial cancer. She followed up with Dr. Skeet Latch on 03/23/17, who performed a biopsy which came back negative. She continues to work full time.   The patient reports feeling well overall. She notes a rash on her left, inner shin which she has obtained treatment for. She denies any other symptoms.   ALLERGIES:  is allergic to phenobarbital.  Meds: Current Outpatient Medications  Medication Sig Dispense Refill  . aspirin 325 MG tablet Take 325 mg by mouth daily.    Marland Kitchen atorvastatin (LIPITOR) 40 MG tablet TK 1 T PO QHS  11  . benazepril (LOTENSIN) 20 MG tablet Take 20 mg by mouth 2 (two) times daily.     . calcium carbonate (OS-CAL) 600 MG TABS tablet Take 600 mg by mouth daily with breakfast.    . cetirizine (ZYRTEC) 10 MG tablet Take 10 mg by mouth daily.    . cholecalciferol (VITAMIN D) 1000 UNITS tablet Take 5,000 Units by mouth daily.     Marland Kitchen denosumab (PROLIA) 60 MG/ML SOLN injection Inject 60 mg into the skin every 6 (six) months. Administer in upper arm, thigh, or abdomen    . diclofenac sodium (VOLTAREN) 1 % GEL Apply 2 g topically daily as needed (for pain).    . fexofenadine (ALLEGRA) 180 MG tablet Take 180 mg by mouth daily.    Marland Kitchen FLUAD 0.5 ML SUSY ADM 0.5ML IM UTD  0  . Folic Acid-Vit L8-XQJ J94 (FOLBEE) 2.5-25-1 MG TABS Take 1 tablet by mouth daily.    Marland Kitchen ibuprofen (ADVIL,MOTRIN) 200 MG tablet Take 200 mg by mouth every 6 (six) hours as needed.    . loratadine (CLARITIN) 10 MG tablet Take 10 mg by mouth daily. Reported on 11/12/2015    . meclizine (ANTIVERT) 25 MG tablet Take 25 mg by  mouth 4 (four) times daily as needed for dizziness or nausea.    . Multiple Vitamin (MULTIVITAMIN) tablet Take 1 tablet by mouth daily. Centrum silver    . Omega-3 Fatty Acids (FISH OIL) 1200 MG CAPS Take 1,200 mg by mouth daily.    Vladimir Faster Glycol-Propyl Glycol (SYSTANE) 0.4-0.3 % SOLN Place 1 drop into both eyes daily as needed (for dry eyes).    . polyethylene glycol (MIRALAX / GLYCOLAX) packet Take 17 g by mouth daily.    . promethazine (PHENERGAN) 25 MG tablet Take 25 mg by mouth every 4 (four) hours as needed for nausea or vomiting.    . simvastatin (ZOCOR) 40 MG tablet Take 40 mg by mouth every evening. Reported on 11/12/2015     No current facility-administered medications for this encounter.     Physical Findings:  height is 5\' 6"  (1.676 m) and weight is 160 lb 12.8 oz (72.9 kg). Her oral temperature is 97.9 F (36.6 C). Her blood pressure is 135/71 and her pulse is 85. Her respiration is 18 and oxygen saturation is 100%.   The patient is in no acute distress. Patient is alert and oriented. The lungs are clear to auscultation. The heart has a regular rhythm and rate. The abdomen is soft and nontender  with normal bowel sounds. No palpable supraclavicular, cervical, or axillary adenopathy. No inguinal adenopathy is appreciated.  Hyperpigmentation changes noted at the area of recurrence along the left upper abdominal wall port site, no palpable recurrence in this area.  On pelvic examination the external genitalia are unremarkable. A speculum exam was performed. There are radiation changes noted in the vaginal vault, but no mucosal lesions appreciated. On bimanual and rectovaginal examination there are no pelvic masses appreciated. A Pap smear was obtained of the proximal vagina.  Lab Findings: Lab Results  Component Value Date   WBC 2.7 (L) 05/20/2013   HGB 10.4 (L) 05/20/2013   HCT 31.6 (L) 05/20/2013   MCV 91.6 05/20/2013   PLT 220 05/20/2013    Radiographic Findings: No  results found.  Impression:  Clinically no evidence of recurrence, Pap smear pending  Plan: We discussed she could follow up as needed in light of interval since treatment but she wishes to continue to follow up once a year.  She will see Dr. Skeet Latch in 6 months.  ____________________________________  Blair Promise, PhD, MD  This document serves as a record of services personally performed by Gery Pray, MD. It was created on his behalf by Wilburn Mylar, a trained medical scribe. The creation of this record is based on the scribe's personal observations and the provider's statements to them. This document has been checked and approved by the attending provider.

## 2017-11-23 NOTE — Progress Notes (Signed)
Pt here to day for a 1 year follow-up for endometrial cancer. Pt denies having any pain. Pt states mild fatigue due to working 40 hours a week. Pt denies having any diarrhea. Pt denies having any dysuria, hematuria, or frequency. Pt states some urgency if she drinks a lot of water. Pt denies having any vaginal or rectal bleeding. Pt states vaginal dryness. Pt denies having nausea or vomiting. Pt states that sometimes she will feel a vaginal itch. Pt denies having any vaginal discharge.  BP 135/71 (BP Location: Left Arm, Patient Position: Sitting, Cuff Size: Normal)   Pulse 85   Temp 97.9 F (36.6 C) (Oral)   Resp 18   Ht 5\' 6"  (1.676 m)   Wt 160 lb 12.8 oz (72.9 kg)   SpO2 100%   BMI 25.95 kg/m    Wt Readings from Last 3 Encounters:  11/23/17 160 lb 12.8 oz (72.9 kg)  07/25/17 165 lb 3.2 oz (74.9 kg)  03/23/17 162 lb 8 oz (73.7 kg)

## 2017-11-29 LAB — CYTOLOGY - PAP

## 2017-12-07 DIAGNOSIS — H52223 Regular astigmatism, bilateral: Secondary | ICD-10-CM | POA: Diagnosis not present

## 2017-12-07 DIAGNOSIS — H2513 Age-related nuclear cataract, bilateral: Secondary | ICD-10-CM | POA: Diagnosis not present

## 2018-01-19 DIAGNOSIS — Z803 Family history of malignant neoplasm of breast: Secondary | ICD-10-CM | POA: Diagnosis not present

## 2018-01-19 DIAGNOSIS — Z1231 Encounter for screening mammogram for malignant neoplasm of breast: Secondary | ICD-10-CM | POA: Diagnosis not present

## 2018-01-25 ENCOUNTER — Encounter (HOSPITAL_COMMUNITY): Payer: Self-pay

## 2018-01-25 ENCOUNTER — Encounter (HOSPITAL_COMMUNITY)
Admission: RE | Admit: 2018-01-25 | Discharge: 2018-01-25 | Disposition: A | Discharge status: Home / Self Care | Payer: Medicare Other | Source: Ambulatory Visit | Attending: Internal Medicine | Admitting: Internal Medicine

## 2018-01-25 DIAGNOSIS — M810 Age-related osteoporosis without current pathological fracture: Secondary | ICD-10-CM | POA: Insufficient documentation

## 2018-01-25 MED ORDER — DENOSUMAB 60 MG/ML ~~LOC~~ SOSY
60.0000 mg | PREFILLED_SYRINGE | Freq: Once | SUBCUTANEOUS | Status: AC
  Administered 2018-01-25: 60 mg via SUBCUTANEOUS
  Filled 2018-01-25: qty 1

## 2018-01-25 NOTE — Progress Notes (Signed)
Prolia 60 mg SQ given lt upper arm.  D/c instructions on Prolia given to pt.  Pt d/c ambulatory to lobby.

## 2018-01-25 NOTE — Discharge Instructions (Signed)
Denosumab injection °What is this medicine? °DENOSUMAB (den oh sue mab) slows bone breakdown. Prolia is used to treat osteoporosis in women after menopause and in men. Xgeva is used to treat a high calcium level due to cancer and to prevent bone fractures and other bone problems caused by multiple myeloma or cancer bone metastases. Xgeva is also used to treat giant cell tumor of the bone. °This medicine may be used for other purposes; ask your health care provider or pharmacist if you have questions. °COMMON BRAND NAME(S): Prolia, XGEVA °What should I tell my health care provider before I take this medicine? °They need to know if you have any of these conditions: °-dental disease °-having surgery or tooth extraction °-infection °-kidney disease °-low levels of calcium or Vitamin D in the blood °-malnutrition °-on hemodialysis °-skin conditions or sensitivity °-thyroid or parathyroid disease °-an unusual reaction to denosumab, other medicines, foods, dyes, or preservatives °-pregnant or trying to get pregnant °-breast-feeding °How should I use this medicine? °This medicine is for injection under the skin. It is given by a health care professional in a hospital or clinic setting. °If you are getting Prolia, a special MedGuide will be given to you by the pharmacist with each prescription and refill. Be sure to read this information carefully each time. °For Prolia, talk to your pediatrician regarding the use of this medicine in children. Special care may be needed. For Xgeva, talk to your pediatrician regarding the use of this medicine in children. While this drug may be prescribed for children as young as 13 years for selected conditions, precautions do apply. °Overdosage: If you think you have taken too much of this medicine contact a poison control center or emergency room at once. °NOTE: This medicine is only for you. Do not share this medicine with others. °What if I miss a dose? °It is important not to miss your  dose. Call your doctor or health care professional if you are unable to keep an appointment. °What may interact with this medicine? °Do not take this medicine with any of the following medications: °-other medicines containing denosumab °This medicine may also interact with the following medications: °-medicines that lower your chance of fighting infection °-steroid medicines like prednisone or cortisone °This list may not describe all possible interactions. Give your health care provider a list of all the medicines, herbs, non-prescription drugs, or dietary supplements you use. Also tell them if you smoke, drink alcohol, or use illegal drugs. Some items may interact with your medicine. °What should I watch for while using this medicine? °Visit your doctor or health care professional for regular checks on your progress. Your doctor or health care professional may order blood tests and other tests to see how you are doing. °Call your doctor or health care professional for advice if you get a fever, chills or sore throat, or other symptoms of a cold or flu. Do not treat yourself. This drug may decrease your body's ability to fight infection. Try to avoid being around people who are sick. °You should make sure you get enough calcium and vitamin D while you are taking this medicine, unless your doctor tells you not to. Discuss the foods you eat and the vitamins you take with your health care professional. °See your dentist regularly. Brush and floss your teeth as directed. Before you have any dental work done, tell your dentist you are receiving this medicine. °Do not become pregnant while taking this medicine or for 5 months after stopping   it. Talk with your doctor or health care professional about your birth control options while taking this medicine. Women should inform their doctor if they wish to become pregnant or think they might be pregnant. There is a potential for serious side effects to an unborn child. Talk  to your health care professional or pharmacist for more information. What side effects may I notice from receiving this medicine? Side effects that you should report to your doctor or health care professional as soon as possible: -allergic reactions like skin rash, itching or hives, swelling of the face, lips, or tongue -bone pain -breathing problems -dizziness -jaw pain, especially after dental work -redness, blistering, peeling of the skin -signs and symptoms of infection like fever or chills; cough; sore throat; pain or trouble passing urine -signs of low calcium like fast heartbeat, muscle cramps or muscle pain; pain, tingling, numbness in the hands or feet; seizures -unusual bleeding or bruising -unusually weak or tired Side effects that usually do not require medical attention (report to your doctor or health care professional if they continue or are bothersome): -constipation -diarrhea -headache -joint pain -loss of appetite -muscle pain -runny nose -tiredness -upset stomach This list may not describe all possible side effects. Call your doctor for medical advice about side effects. You may report side effects to FDA at 1-800-FDA-1088. Where should I keep my medicine? This medicine is only given in a clinic, doctor's office, or other health care setting and will not be stored at home. NOTE: This sheet is a summary. It may not cover all possible information. If you have questions about this medicine, talk to your doctor, pharmacist, or health care provider.  2018 Elsevier/Gold Standard (2016-05-03 19:17:21)

## 2018-01-27 DIAGNOSIS — Z23 Encounter for immunization: Secondary | ICD-10-CM | POA: Diagnosis not present

## 2018-02-13 ENCOUNTER — Telehealth: Payer: Self-pay | Admitting: *Deleted

## 2018-02-13 NOTE — Telephone Encounter (Signed)
Returned the patient's call and scheduled the patient for a follow up on November 7th

## 2018-03-01 ENCOUNTER — Inpatient Hospital Stay: Payer: Medicare Other | Attending: Gynecologic Oncology | Admitting: Gynecologic Oncology

## 2018-03-01 ENCOUNTER — Encounter: Payer: Self-pay | Admitting: Gynecologic Oncology

## 2018-03-01 VITALS — BP 157/57 | HR 75 | Temp 97.8°F | Resp 16 | Ht 65.98 in | Wt 157.0 lb

## 2018-03-01 DIAGNOSIS — Z90722 Acquired absence of ovaries, bilateral: Secondary | ICD-10-CM | POA: Insufficient documentation

## 2018-03-01 DIAGNOSIS — C541 Malignant neoplasm of endometrium: Secondary | ICD-10-CM | POA: Diagnosis not present

## 2018-03-01 DIAGNOSIS — Z08 Encounter for follow-up examination after completed treatment for malignant neoplasm: Secondary | ICD-10-CM | POA: Insufficient documentation

## 2018-03-01 DIAGNOSIS — Z9221 Personal history of antineoplastic chemotherapy: Secondary | ICD-10-CM | POA: Insufficient documentation

## 2018-03-01 DIAGNOSIS — C549 Malignant neoplasm of corpus uteri, unspecified: Secondary | ICD-10-CM

## 2018-03-01 DIAGNOSIS — Z923 Personal history of irradiation: Secondary | ICD-10-CM | POA: Insufficient documentation

## 2018-03-01 DIAGNOSIS — Z8542 Personal history of malignant neoplasm of other parts of uterus: Secondary | ICD-10-CM | POA: Insufficient documentation

## 2018-03-01 DIAGNOSIS — Z90710 Acquired absence of both cervix and uterus: Secondary | ICD-10-CM

## 2018-03-01 NOTE — Patient Instructions (Signed)
Follow-up in 1 year Apply desitin to the external genitalia please  Thank you very much [name] for allowing me to provide care for you today.  I appreciate your confidence in choosing our Gynecologic Oncology team.  If you have any questions about your visit today please call our office and we will get back to you as soon as possible.  Please consider using the website Medlineplus.gov as an Geneticist, molecular.   Francetta Found. Scottie Stanish MD., PhD Gynecologic Oncology

## 2018-03-01 NOTE — Progress Notes (Signed)
Office Visit:  GYN ONCOLOGY  CHIEF COMPLAINT: Surveillance for  endometrial cancer.   ASSESSMENT/PLAN This is a 77 y.o.with stage IIIA grade 1endometrial cancer treated on GOG protocol 258.  He was randomized to the arm that received 6 cycles of Taxol carboplatin therapy. Recurrent disease was identified at a laparoscopic port site, pelvic lymph nodes and in the vagina in 2011.  She received additional Taxol carboplatin therapy and adjuvant external beam and vaginal brachytherapy. This treatment was completed in June 2012 and she's been without any evidence of disease since.  NED Follow-up in 1 year   HISTORY OF PRESENT ILLNESS: This is a 77 y.o. , who underwent a robotic-assisted laparoscopic hysterectomy, bilateral salpingo- oophorectomy, lymph node dissection in July, 2010. Pathology was consistent with a stage III endometrial cancer with microscopic metastases to the adnexa. She was enrolled in GOG protocol 258 and was randomized to receive 6 cycles of Taxol and carboplatin. Posttreatment CT scan in January 2011 was WNl. At the end of 2011, on routine physical examination, she was found to have  vaginal recurrence. CT imaging at that time confirmed the presence of an obturator mass at the pelvic port site in the left upper quadrant and enlarged obturator nodes. She  received 6 cycles of carboplatin and Taxol therapy. Imaging demonstrated residual left obturator and inguinal adenopathy and residual soft tissue mass and there was still residual disease in the vagina. She subsequently underwent external beam pelvic radiotherapy along with vaginal brachytherapy and treatment of the port sites. Treatment was completed in June, 2012.   Patient has had two episodes of SBO since pelvic radiotherapy, most recently in January 2015. These were managed with conservative therapy.  PET 01/2013 (at the time of an SBO): without evidence of metastatic disease.  She saw Dr Sondra Come for routine surveillance in  July 23rd, 2015. Pap at that visit showed atypical glandular cells with endometrioid phenotype. 11/2013 colposcopy and biopsy   Vagina, biopsy, right - SUPERFICIAL FRAGMENTS OF BENIGN SQUAMOUS EPITHELIUM. - NO MALIGNANCY IDENTIFIED, SEE COMMENT.  Pap 08/2015 with radiation changes   Interval History Patient feels very well. Incontinence is unchanged.  Occasional vulva itching .    Past Medical History:  Diagnosis Date  . Allergy   . Benign positional vertigo   . Bursitis of shoulder, left   . Cancer [facility])    Endometrial ca/Recurrence  . CKD (chronic kidney disease), stage III ([place])   . History of colonic polyps   . Hypercholesteremia   . Hypercholesterolemia   . Hypertension   . Lumbar pain   . Osteoarthritis   . Osteoporosis   . S/P radiation therapy July 29, 2010-Sep 06, 2010   External beam of pelvis  . S/P radiation therapy 09/15/10, 09/24/10, 09/28/2010   Intracavitary brachytherapy  . SBO (small bowel obstruction) ([place])   . Status post chemotherapy    carboplatin/paclitaxel x 6 rounds  Mammogram 12/2013 wnl   Past Surgical History:  Procedure Laterality Date  . ABDOMINAL HYSTERECTOMY    . BREAST LUMPECTOMY  1993   Left breast, benign  . ROBOTIC ASSISTED LAPAROSCOPIC VAGINAL HYSTERECTOMY WITH FIBROID REMOVAL  July 2010   bilat. salpingo-oophorectomy   SOCIAL HISTORY: Life is good and stable no family changes.  Will spend Thanksgiving with the [place] well , no weight loss Cardiovascular  No chest pain, shortness of breath, or edema  Pulmonary  No cough or wheeze.  Gastro Intestinal  No nausea, vomitting, or diarrhoea.  No bright red blood per rectum, no abdominal pain, change in bowel movement, or constipation.  Genito Urinary  No frequency, reports urgency with incontinence, no dysuria, no vaginal bleeding or discharge, occasional vulva pruritis Musculo Skeletal  No myalgia, arthralgia, joint swelling or pain   Neurologic  No weakness, numbness, change in gait,  Psychology  No depression, anxiety, insomnia.    PHYSICAL EXAMINATION:   BP (!) 157/57 (BP Location: Left Arm, Patient Position: Sitting)   Pulse 75   Temp 97.8 F (36.6 C) (Oral)   Resp 16   Ht 5' 5.98" (1.676 m)   Wt 157 lb (71.2 kg)   SpO2 100%   BMI 25.35 kg/m   WD female in NAD CHEST: Clear to auscultation. No wheezes or rhonchi breath sounds equivalent bilaterally HEART: Regular rate and rhythm.  ABDOMEN: Soft, nontender, obese. No palpable lesions at the port sites. No evidence of hernia. No masses. No omental cake. No fluid wave.   BACK  No CVAT LN.  No axillary, cervical or supraclavicular adenopathy PELVIC: Normal external genitalia. Her vagina is very atrophic and foreshortened . With radiation changes. No gross lesions, no cul de sac nodularity or pelvic masses. Rectovaginal exam is confirmatory. External hemmorhoids nontender  EXT 1-2+ left lower extremity edema extending to just above the knee

## 2018-03-29 DIAGNOSIS — M810 Age-related osteoporosis without current pathological fracture: Secondary | ICD-10-CM | POA: Diagnosis not present

## 2018-03-29 DIAGNOSIS — E7800 Pure hypercholesterolemia, unspecified: Secondary | ICD-10-CM | POA: Diagnosis not present

## 2018-03-29 DIAGNOSIS — E119 Type 2 diabetes mellitus without complications: Secondary | ICD-10-CM | POA: Diagnosis not present

## 2018-03-29 DIAGNOSIS — R82998 Other abnormal findings in urine: Secondary | ICD-10-CM | POA: Diagnosis not present

## 2018-03-29 DIAGNOSIS — N183 Chronic kidney disease, stage 3 (moderate): Secondary | ICD-10-CM | POA: Diagnosis not present

## 2018-04-05 DIAGNOSIS — I10 Essential (primary) hypertension: Secondary | ICD-10-CM | POA: Diagnosis not present

## 2018-04-05 DIAGNOSIS — N183 Chronic kidney disease, stage 3 (moderate): Secondary | ICD-10-CM | POA: Diagnosis not present

## 2018-04-05 DIAGNOSIS — E7800 Pure hypercholesterolemia, unspecified: Secondary | ICD-10-CM | POA: Diagnosis not present

## 2018-04-05 DIAGNOSIS — Z6826 Body mass index (BMI) 26.0-26.9, adult: Secondary | ICD-10-CM | POA: Diagnosis not present

## 2018-04-05 DIAGNOSIS — C541 Malignant neoplasm of endometrium: Secondary | ICD-10-CM | POA: Diagnosis not present

## 2018-04-05 DIAGNOSIS — F418 Other specified anxiety disorders: Secondary | ICD-10-CM | POA: Diagnosis not present

## 2018-04-05 DIAGNOSIS — Z0000 Encounter for general adult medical examination without abnormal findings: Secondary | ICD-10-CM | POA: Diagnosis not present

## 2018-04-05 DIAGNOSIS — Z1389 Encounter for screening for other disorder: Secondary | ICD-10-CM | POA: Diagnosis not present

## 2018-04-05 DIAGNOSIS — R195 Other fecal abnormalities: Secondary | ICD-10-CM | POA: Diagnosis not present

## 2018-04-05 DIAGNOSIS — R808 Other proteinuria: Secondary | ICD-10-CM | POA: Diagnosis not present

## 2018-04-05 DIAGNOSIS — D508 Other iron deficiency anemias: Secondary | ICD-10-CM | POA: Diagnosis not present

## 2018-04-05 DIAGNOSIS — E119 Type 2 diabetes mellitus without complications: Secondary | ICD-10-CM | POA: Diagnosis not present

## 2018-04-06 DIAGNOSIS — Z1212 Encounter for screening for malignant neoplasm of rectum: Secondary | ICD-10-CM | POA: Diagnosis not present

## 2018-05-01 ENCOUNTER — Encounter: Payer: Self-pay | Admitting: Internal Medicine

## 2018-05-01 ENCOUNTER — Ambulatory Visit (INDEPENDENT_AMBULATORY_CARE_PROVIDER_SITE_OTHER): Payer: Medicare Other | Admitting: Internal Medicine

## 2018-05-01 ENCOUNTER — Other Ambulatory Visit (INDEPENDENT_AMBULATORY_CARE_PROVIDER_SITE_OTHER): Payer: Medicare Other

## 2018-05-01 VITALS — BP 118/80 | HR 94 | Ht 65.0 in | Wt 153.4 lb

## 2018-05-01 DIAGNOSIS — D509 Iron deficiency anemia, unspecified: Secondary | ICD-10-CM

## 2018-05-01 LAB — CBC WITH DIFFERENTIAL/PLATELET
BASOS ABS: 0 10*3/uL (ref 0.0–0.1)
BASOS PCT: 0.5 % (ref 0.0–3.0)
EOS ABS: 0.2 10*3/uL (ref 0.0–0.7)
Eosinophils Relative: 2.2 % (ref 0.0–5.0)
HEMATOCRIT: 39.5 % (ref 36.0–46.0)
Hemoglobin: 12.6 g/dL (ref 12.0–15.0)
LYMPHS PCT: 18.6 % (ref 12.0–46.0)
Lymphs Abs: 1.5 10*3/uL (ref 0.7–4.0)
MCHC: 32 g/dL (ref 30.0–36.0)
MCV: 80.3 fl (ref 78.0–100.0)
MONOS PCT: 5.2 % (ref 3.0–12.0)
Monocytes Absolute: 0.4 10*3/uL (ref 0.1–1.0)
Neutro Abs: 6 10*3/uL (ref 1.4–7.7)
Neutrophils Relative %: 73.5 % (ref 43.0–77.0)
Platelets: 399 10*3/uL (ref 150.0–400.0)
RBC: 4.92 Mil/uL (ref 3.87–5.11)
RDW: 24 % — ABNORMAL HIGH (ref 11.5–15.5)
WBC: 8.2 10*3/uL (ref 4.0–10.5)

## 2018-05-01 LAB — FERRITIN: Ferritin: 18.1 ng/mL (ref 10.0–291.0)

## 2018-05-01 MED ORDER — NA SULFATE-K SULFATE-MG SULF 17.5-3.13-1.6 GM/177ML PO SOLN: 1.0000 | Freq: Once | ORAL | 0 refills | Status: AC

## 2018-05-01 MED ORDER — NA SULFATE-K SULFATE-MG SULF 17.5-3.13-1.6 GM/177ML PO SOLN: 1.0000 | Freq: Once | ORAL | 0 refills | Status: DC

## 2018-05-01 NOTE — Progress Notes (Signed)
HISTORY OF PRESENT ILLNESS:  [name] is a 78 y.o. female with multiple medical problems including a history of advanced endometrial cancer (July 2010 status post operative intervention followed by chemoradiation therapy) and small bowel obstruction who was sent today by Dr. Osborne Casco regarding iron deficiency anemia.  Patient did undergo routine colonoscopy remotely in October 2005.  The examination including intubation of the terminal ileum was normal.  She was referred back to this office February 2017 regarding asymptomatic Hemoccult-positive stool with normal hemoglobin.  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  Review of that x-ray performed June 04, 2015 revealed no significant colonic polypoid lesion, mass, or stricture.  No evidence of recurrent or metastatic disease.  Patient tells me that in recent months she has been feeling fatigued.  Blood work with Dr. Osborne Casco March 29, 2018 revealed interval anemia with a hemoglobin of 9.8.  MCV 80.8.  Normal white blood cell count and platelets.  Normal comprehensive metabolic panel including creatinine 1.0.  The patient did have Hemoccult testing performed which was negative.  Last hemoglobin A1c 6.9.  She does take daily aspirin 325 mg daily.  Also occasional NSAIDs.  She was told to stop these.  She has been using aspirin every other day.  She has not been on PPI.  She was started on iron therapy.  Dark stool since that time but not previous.  She denies hematochezia.  She will have occasional diarrhea or constipation.  This is not new.  Her weight is fluctuated.  Lost 5 pounds in the past 6 months by intent.  REVIEW OF SYSTEMS:  All non-GI ROS negative except for weakness or fatigue, anxiety, urinary leakage  Past Medical History:  Diagnosis Date  . Allergy   . Benign positional vertigo   . Bursitis of shoulder, left   . Cancer [facility])    Endometrial  ca/Recurrence  . CKD (chronic kidney disease), stage III ([place])   . History of colonic polyps   . Hypercholesteremia   . Hypercholesterolemia   . Hypertension   . Lumbar pain   . Osteoarthritis   . Osteoporosis   . S/P radiation therapy July 29, 2010-Sep 06, 2010   External beam of pelvis  . S/P radiation therapy 09/15/10, 09/24/10, 09/28/2010   Intracavitary brachytherapy  . SBO (small bowel obstruction) ([place])   . Status post chemotherapy    carboplatin/paclitaxel x 6 rounds    Past Surgical History:  Procedure Laterality Date  . ABDOMINAL HYSTERECTOMY    . BREAST LUMPECTOMY  1993   Left breast, benign  . ROBOTIC ASSISTED LAPAROSCOPIC VAGINAL HYSTERECTOMY WITH FIBROID REMOVAL  July 2010   bilat. salpingo-oophorectomy    Social History [name]  reports that she quit smoking about 41 years ago. Her smoking use included cigarettes. She smoked 1.00 pack per day. She has never used smokeless tobacco. She reports that she does not drink alcohol or use drugs.  family history includes Breast cancer in her mother and sister; Diabetes in her sister; Heart disease in her father.  Allergies  Allergen Reactions  . Phenobarbital     Feeling of uneasiness       PHYSICAL EXAMINATION: Vital signs: BP 118/80   Pulse 94   Ht 5\' 5"  (1.651 m)   Wt 153 lb 6 oz (69.6 kg)   BMI 25.52 kg/m   Constitutional: generally well-appearing, no acute distress Psychiatric: alert and oriented x3, cooperative Eyes:  extraocular movements intact, anicteric, conjunctiva pink Mouth: oral pharynx moist, no lesions Neck: supple no lymphadenopathy Cardiovascular: heart regular rate and rhythm, no murmur Lungs: clear to auscultation bilaterally Abdomen: soft, nontender, nondistended, no obvious ascites, no peritoneal signs, normal bowel sounds, no organomegaly Rectal: For until colonoscopy Extremities: no clubbing, cyanosis, or lower extremity edema bilaterally Skin: no lesions on visible  extremities Neuro: No focal deficits. No asterixis.  Cranial nerves intact  ASSESSMENT:  1.  Iron deficiency anemia.  Hemoccult negative stool.  Negative GI review of systems.  Rule out occult GI mucosal lesion.  Possibilities include aspirin or NSAID related lesions, AVMs, Cameron erosions, colonic neoplasia. 2.  History of endometrial cancer status post surgery followed by chemoradiation therapy   PLAN:  1.  Repeat CBC and ferritin level today to see the response to iron therapy.  We will contact the patient with the results when available 2.  Schedule colonoscopy to evaluate iron deficiency anemia.  The patient is HIGH RISK given her prior pelvic surgery and radiation therapy.  She understands.The nature of the procedure, as well as the risks, benefits, and alternatives were carefully and thoroughly reviewed with the patient. Ample time for discussion and questions allowed. The patient understood, was satisfied, and agreed to proceed. 3.  Upper endoscopy to evaluate iron deficiency anemia.  Biopsies to rule out iron absorption disorder.  Patient is high risk as above.The nature of the procedure, as well as the risks, benefits, and alternatives were carefully and thoroughly reviewed with the patient. Ample time for discussion and questions allowed. The patient understood, was satisfied, and agreed to proceed. 4.  Hold iron 5 to 7 days prior to the procedures to assist with bowel preparation.  Resume thereafter 5.  Avoid NSAIDs.  Avoid aspirin unless sound medical reason.  If so, the proper dose would be 81 mg 6.  Ongoing general medical care with Dr. Osborne Casco

## 2018-05-01 NOTE — Patient Instructions (Addendum)
If you are age 79 or older, your body mass index should be between 23-30. Your Body mass index is 25.52 kg/m. If this is out of the aforementioned range listed, please consider follow up with your Primary Care Provider.  If you are age 38 or younger, your body mass index should be between 19-25. Your Body mass index is 25.52 kg/m. If this is out of the aformentioned range listed, please consider follow up with your Primary Care Provider.   Your provider has requested that you go to the basement level for lab work before leaving today. Press "B" on the elevator. The lab is located at the first door on the left as you exit the elevator.   You have been scheduled for an endoscopy and colonoscopy. Please follow the written instructions given to you at your visit today. Please pick up your prep supplies at the pharmacy within the next 1-3 days. If you use inhalers (even only as needed), please bring them with you on the day of your procedure. Your physician has requested that you go to www.startemmi.com and enter the access code given to you at your visit today. This web site gives a general overview about your procedure. However, you should still follow specific instructions given to you by our office regarding your preparation for the procedure.  It was a pleasure to see you today!

## 2018-05-09 ENCOUNTER — Encounter: Payer: Medicare Other | Admitting: Gastroenterology

## 2018-05-09 ENCOUNTER — Ambulatory Visit (AMBULATORY_SURGERY_CENTER): Payer: Medicare Other | Admitting: Internal Medicine

## 2018-05-09 ENCOUNTER — Encounter: Payer: Self-pay | Admitting: Internal Medicine

## 2018-05-09 VITALS — BP 118/44 | HR 79 | Temp 97.5°F | Resp 11 | Ht 65.0 in | Wt 153.0 lb

## 2018-05-09 DIAGNOSIS — D122 Benign neoplasm of ascending colon: Secondary | ICD-10-CM | POA: Diagnosis not present

## 2018-05-09 DIAGNOSIS — D508 Other iron deficiency anemias: Secondary | ICD-10-CM

## 2018-05-09 DIAGNOSIS — D509 Iron deficiency anemia, unspecified: Secondary | ICD-10-CM | POA: Diagnosis not present

## 2018-05-09 MED ORDER — SODIUM CHLORIDE 0.9 % IV SOLN: 500.0000 mL | INTRAVENOUS | Status: DC

## 2018-05-09 NOTE — Op Note (Signed)
[facility] Patient Name: [name] Procedure Date: 05/09/2018 2:20 PM MRN: [id number] Endoscopist: Docia Chuck. Henrene Pastor , MD Age: 78 Referring MD:  Date of Birth: [birth year] Gender: Female Account #: [id number] Procedure:                Colonoscopy with cold snare polypectomy x 2 Indications:              Iron deficiency anemia Medicines:                Monitored Anesthesia Care Procedure:                Pre-Anesthesia Assessment:                           - Prior to the procedure, a History and Physical                            was performed, and patient medications and                            allergies were reviewed. The patient's tolerance of                            previous anesthesia was also reviewed. The risks                            and benefits of the procedure and the sedation                            options and risks were discussed with the patient.                            All questions were answered, and informed consent                            was obtained. Prior Anticoagulants: The patient has                            taken no previous anticoagulant or antiplatelet                            agents. ASA Grade Assessment: II - A patient with                            mild systemic disease. After reviewing the risks                            and benefits, the patient was deemed in                            satisfactory condition to undergo the procedure.                           After obtaining informed consent, the colonoscope  was passed under direct vision. Throughout the                            procedure, the patient's blood pressure, pulse, and                            oxygen saturations were monitored continuously. The                            Colonoscope was introduced through the anus and                            advanced to the the cecum, identified by                            appendiceal  orifice and ileocecal valve. The                            terminal ileum, ileocecal valve, appendiceal                            orifice, and rectum were photographed. The quality                            of the bowel preparation was excellent. The                            colonoscopy was performed without difficulty. The                            patient tolerated the procedure well. The bowel                            preparation used was SUPREP. Scope In: 2:37:55 PM Scope Out: 2:57:34 PM Scope Withdrawal Time: 0 hours 14 minutes 27 seconds  Total Procedure Duration: 0 hours 19 minutes 39 seconds  Findings:                 The terminal ileum appeared normal.                           Two polyps were found in the ascending colon. The                            polyps were 3 to 5 mm in size. These polyps were                            removed with a cold snare. Resection and retrieval                            were complete.                           The exam was otherwise without abnormality on  direct and retroflexion views. Complications:            No immediate complications. Estimated blood loss:                            None. Estimated Blood Loss:     Estimated blood loss: none. Impression:               - The examined portion of the ileum was normal.                           - Two 3 to 5 mm polyps in the ascending colon,                            removed with a cold snare. Resected and retrieved.                           - The examination was otherwise normal on direct                            and retroflexion views. Recommendation:           - Repeat colonoscopy is not recommended for                            surveillance.                           - Patient has a contact number available for                            emergencies. The signs and symptoms of potential                            delayed complications were discussed  with the                            patient. Return to normal activities tomorrow.                            Written discharge instructions were provided to the                            patient.                           - Resume previous diet.                           - Continue present medications.                           - Await pathology results.                           - EGD today. Please see report Docia Chuck. Henrene Pastor, MD 05/09/2018 2:59:22 PM This report has been signed electronically.

## 2018-05-09 NOTE — Progress Notes (Signed)
Called to room to assist during endoscopic procedure.  Patient ID and intended procedure confirmed with present staff. Received instructions for my participation in the procedure from the performing physician.  

## 2018-05-09 NOTE — Progress Notes (Signed)
PT taken to PACU. Monitors in place. VSS. Report given to RN. 

## 2018-05-09 NOTE — Op Note (Signed)
[facility] Patient Name: [name] Procedure Date: 05/09/2018 2:19 PM MRN: [id number] Endoscopist: Docia Chuck. Henrene Pastor , MD Age: 78 Referring MD:  Date of Birth: 1940/06/08 Gender: Female Account #: [id number] Procedure:                Upper GI endoscopy with biopsies Indications:              Iron deficiency anemia. Repeat blood work from last                            week revealed hemoglobin of 12.6, MCV 80.3,                            ferritin 18 (all normal) Medicines:                Monitored Anesthesia Care Procedure:                Pre-Anesthesia Assessment:                           - Prior to the procedure, a History and Physical                            was performed, and patient medications and                            allergies were reviewed. The patient's tolerance of                            previous anesthesia was also reviewed. The risks                            and benefits of the procedure and the sedation                            options and risks were discussed with the patient.                            All questions were answered, and informed consent                            was obtained. Prior Anticoagulants: The patient has                            taken no previous anticoagulant or antiplatelet                            agents. ASA Grade Assessment: II - A patient with                            mild systemic disease. After reviewing the risks                            and benefits, the patient was deemed in  satisfactory condition to undergo the procedure.                           After obtaining informed consent, the endoscope was                            passed under direct vision. Throughout the                            procedure, the patient's blood pressure, pulse, and                            oxygen saturations were monitored continuously. The                            Endoscope was  introduced through the mouth, and                            advanced to the second part of duodenum. The upper                            GI endoscopy was accomplished without difficulty.                            The patient tolerated the procedure well. Scope In: Scope Out: Findings:                 The esophagus was normal.                           The stomach was normal.                           The examined duodenum was normal. Biopsies for                            histology were taken with a cold forceps for                            evaluation of celiac disease.                           The cardia and gastric fundus were normal on                            retroflexion. Complications:            No immediate complications. Estimated Blood Loss:     Estimated blood loss: none. Impression:               1. Normal EGD. No cause for iron deficiency anemia                            identified on either colonoscopy with ileal  intubation or upper endoscopy.                           2. Patient may have small bowel lesion such as                            AVMs. However, I will not pursue capsule endoscopy                            given her prior surgical history including small                            bowel obstruction and the fact that she is                            asymptomatic and that her iron and ferritin levels                            have returned normal with iron supplementation. I                            would however recommend iron supplementation once                            daily indefinitely to reduce the risk of recurrent                            anemia Recommendation:           1. Follow-up biopsies                           2. Iron supplement once daily indefinitely                           3. Return to the care of Dr. Osborne Casco. Docia Chuck. Henrene Pastor, MD 05/09/2018 3:10:40 PM This report has been signed electronically.

## 2018-05-09 NOTE — Patient Instructions (Signed)
See instructions from Procedure report.  YOU HAD AN ENDOSCOPIC PROCEDURE TODAY AT [facility]:   Refer to the procedure report that was given to you for any specific questions about what was found during the examination.  If the procedure report does not answer your questions, please call your gastroenterologist to clarify.  If you requested that your care partner not be given the details of your procedure findings, then the procedure report has been included in a sealed envelope for you to review at your convenience later.  YOU SHOULD EXPECT: Some feelings of bloating in the abdomen. Passage of more gas than usual.  Walking can help get rid of the air that was put into your GI tract during the procedure and reduce the bloating. If you had a lower endoscopy (such as a colonoscopy or flexible sigmoidoscopy) you may notice spotting of blood in your stool or on the toilet paper. If you underwent a bowel prep for your procedure, you may not have a normal bowel movement for a few days.  Please Note:  You might notice some irritation and congestion in your nose or some drainage.  This is from the oxygen used during your procedure.  There is no need for concern and it should clear up in a day or so.  SYMPTOMS TO REPORT IMMEDIATELY:   Following lower endoscopy (colonoscopy or flexible sigmoidoscopy):  Excessive amounts of blood in the stool  Significant tenderness or worsening of abdominal pains  Swelling of the abdomen that is new, acute  Fever of 100F or higher   Following upper endoscopy (EGD)  Vomiting of blood or coffee ground material  New chest pain or pain under the shoulder blades  Painful or persistently difficult swallowing  New shortness of breath  Fever of 100F or higher  Black, tarry-looking stools  For urgent or emergent issues, a gastroenterologist can be reached at any hour by calling [phone].   DIET:  We do recommend a small meal at first, but then you  may proceed to your regular diet.  Drink plenty of fluids but you should avoid alcoholic beverages for 24 hours.  ACTIVITY:  You should plan to take it easy for the rest of today and you should NOT DRIVE or use heavy machinery until tomorrow (because of the sedation medicines used during the test).    FOLLOW UP: Our staff will call the number listed on your records the next business day following your procedure to check on you and address any questions or concerns that you may have regarding the information given to you following your procedure. If we do not reach you, we will leave a message.  However, if you are feeling well and you are not experiencing any problems, there is no need to return our call.  We will assume that you have returned to your regular daily activities without incident.  If any biopsies were taken you will be contacted by phone or by letter within the next 1-3 weeks.  Please call us at [phone] if you have not heard about the biopsies in 3 weeks.    SIGNATURES/CONFIDENTIALITY: You and/or your care partner have signed paperwork which will be entered into your electronic medical record.  These signatures attest to the fact that that the information above on your After Visit Summary has been reviewed and is understood.  Full responsibility of the confidentiality of this discharge information lies with you and/or your care-partner.

## 2018-05-10 ENCOUNTER — Telehealth: Payer: Self-pay

## 2018-05-10 NOTE — Telephone Encounter (Signed)
  Follow up Call-  Call back number 05/09/2018  Post procedure Call Back phone  # [phone] or [phone]  Permission to leave phone message Yes  Some recent data might be hidden     Patient questions:  Do you have a fever, pain , or abdominal swelling? No. Pain Score  0 *  Have you tolerated food without any problems? Yes.    Have you been able to return to your normal activities? Yes.    Do you have any questions about your discharge instructions: Diet   No. Medications  No. Follow up visit  No.  Do you have questions or concerns about your Care? No.  Actions: * If pain score is 4 or above: No action needed, pain <4.

## 2018-05-15 ENCOUNTER — Encounter: Payer: Self-pay | Admitting: Internal Medicine

## 2018-07-31 ENCOUNTER — Encounter (HOSPITAL_COMMUNITY): Payer: Medicare Other

## 2018-08-29 ENCOUNTER — Encounter (HOSPITAL_COMMUNITY): Payer: Self-pay

## 2018-08-29 ENCOUNTER — Ambulatory Visit (HOSPITAL_COMMUNITY)
Admission: RE | Admit: 2018-08-29 | Discharge: 2018-08-29 | Disposition: A | Discharge status: Home / Self Care | Payer: Medicare Other | Source: Ambulatory Visit | Attending: Internal Medicine | Admitting: Internal Medicine

## 2018-08-29 ENCOUNTER — Other Ambulatory Visit: Payer: Self-pay

## 2018-08-29 ENCOUNTER — Ambulatory Visit (HOSPITAL_COMMUNITY): Payer: Medicare Other

## 2018-08-29 ENCOUNTER — Encounter (HOSPITAL_COMMUNITY): Payer: Medicare Other

## 2018-08-29 DIAGNOSIS — M810 Age-related osteoporosis without current pathological fracture: Secondary | ICD-10-CM | POA: Diagnosis not present

## 2018-08-29 MED ORDER — DENOSUMAB 60 MG/ML ~~LOC~~ SOSY
60.0000 mg | PREFILLED_SYRINGE | Freq: Once | SUBCUTANEOUS | Status: AC
  Administered 2018-08-29: 12:00:00 60 mg via SUBCUTANEOUS
  Filled 2018-08-29: qty 1

## 2018-09-18 DIAGNOSIS — L821 Other seborrheic keratosis: Secondary | ICD-10-CM | POA: Diagnosis not present

## 2018-09-18 DIAGNOSIS — L578 Other skin changes due to chronic exposure to nonionizing radiation: Secondary | ICD-10-CM | POA: Diagnosis not present

## 2018-09-18 DIAGNOSIS — L570 Actinic keratosis: Secondary | ICD-10-CM | POA: Diagnosis not present

## 2018-09-18 DIAGNOSIS — L814 Other melanin hyperpigmentation: Secondary | ICD-10-CM | POA: Diagnosis not present

## 2018-09-18 DIAGNOSIS — D1801 Hemangioma of skin and subcutaneous tissue: Secondary | ICD-10-CM | POA: Diagnosis not present

## 2018-10-19 DIAGNOSIS — M1990 Unspecified osteoarthritis, unspecified site: Secondary | ICD-10-CM | POA: Diagnosis not present

## 2018-10-19 DIAGNOSIS — I129 Hypertensive chronic kidney disease with stage 1 through stage 4 chronic kidney disease, or unspecified chronic kidney disease: Secondary | ICD-10-CM | POA: Diagnosis not present

## 2018-10-19 DIAGNOSIS — R809 Proteinuria, unspecified: Secondary | ICD-10-CM | POA: Diagnosis not present

## 2018-10-19 DIAGNOSIS — N183 Chronic kidney disease, stage 3 (moderate): Secondary | ICD-10-CM | POA: Diagnosis not present

## 2018-10-19 DIAGNOSIS — D509 Iron deficiency anemia, unspecified: Secondary | ICD-10-CM | POA: Diagnosis not present

## 2018-10-19 DIAGNOSIS — C541 Malignant neoplasm of endometrium: Secondary | ICD-10-CM | POA: Diagnosis not present

## 2018-10-19 DIAGNOSIS — M810 Age-related osteoporosis without current pathological fracture: Secondary | ICD-10-CM | POA: Diagnosis not present

## 2018-10-19 DIAGNOSIS — E119 Type 2 diabetes mellitus without complications: Secondary | ICD-10-CM | POA: Diagnosis not present

## 2018-10-19 DIAGNOSIS — E7800 Pure hypercholesterolemia, unspecified: Secondary | ICD-10-CM | POA: Diagnosis not present

## 2018-11-29 ENCOUNTER — Other Ambulatory Visit (HOSPITAL_COMMUNITY)
Admission: RE | Admit: 2018-11-29 | Discharge: 2018-11-29 | Disposition: A | Discharge status: Home / Self Care | Payer: Medicare Other | Source: Ambulatory Visit | Attending: Radiation Oncology | Admitting: Radiation Oncology

## 2018-11-29 ENCOUNTER — Other Ambulatory Visit: Payer: Self-pay

## 2018-11-29 ENCOUNTER — Ambulatory Visit
Admission: RE | Admit: 2018-11-29 | Discharge: 2018-11-29 | Disposition: A | Discharge status: Home / Self Care | Payer: Medicare Other | Source: Ambulatory Visit | Attending: Radiation Oncology | Admitting: Radiation Oncology

## 2018-11-29 ENCOUNTER — Encounter: Payer: Self-pay | Admitting: Radiation Oncology

## 2018-11-29 VITALS — BP 168/75 | HR 87 | Temp 98.5°F | Resp 18 | Ht 64.0 in | Wt 157.0 lb

## 2018-11-29 DIAGNOSIS — Z08 Encounter for follow-up examination after completed treatment for malignant neoplasm: Secondary | ICD-10-CM | POA: Diagnosis not present

## 2018-11-29 DIAGNOSIS — Z90722 Acquired absence of ovaries, bilateral: Secondary | ICD-10-CM | POA: Insufficient documentation

## 2018-11-29 DIAGNOSIS — Z79899 Other long term (current) drug therapy: Secondary | ICD-10-CM | POA: Diagnosis not present

## 2018-11-29 DIAGNOSIS — Z90710 Acquired absence of both cervix and uterus: Secondary | ICD-10-CM | POA: Insufficient documentation

## 2018-11-29 DIAGNOSIS — C541 Malignant neoplasm of endometrium: Secondary | ICD-10-CM | POA: Diagnosis not present

## 2018-11-29 DIAGNOSIS — C549 Malignant neoplasm of corpus uteri, unspecified: Secondary | ICD-10-CM

## 2018-11-29 DIAGNOSIS — Z8542 Personal history of malignant neoplasm of other parts of uterus: Secondary | ICD-10-CM | POA: Insufficient documentation

## 2018-11-29 DIAGNOSIS — Z7982 Long term (current) use of aspirin: Secondary | ICD-10-CM | POA: Insufficient documentation

## 2018-11-29 NOTE — Progress Notes (Signed)
Patient in for follow up doing well. States she had her last pap smear with [place] in 10/2017 which was normal. Denies any issues or complaints of at this time.

## 2018-11-29 NOTE — Patient Instructions (Signed)
Coronavirus (COVID-19) Are you at risk?  Are you at risk for the Coronavirus (COVID-19)?  To be considered HIGH RISK for Coronavirus (COVID-19), you have to meet the following criteria:  . Traveled to [place], [place], [place], [place] or [place]; or in the [place] to [place], [place], [place], or [place]; and have fever, cough, and shortness of breath within the last 2 weeks of travel OR . Been in close contact with a person diagnosed with COVID-19 within the last 2 weeks and have fever, cough, and shortness of breath . IF YOU DO NOT MEET THESE CRITERIA, YOU ARE CONSIDERED LOW RISK FOR COVID-19.  What to do if you are HIGH RISK for COVID-19?  . If you are having a medical emergency, call 911. . Seek medical care right away. Before you go to a doctor's office, urgent care or emergency department, call ahead and tell them about your recent travel, contact with someone diagnosed with COVID-19, and your symptoms. You should receive instructions from your physician's office regarding next steps of care.  . When you arrive at healthcare provider, tell the healthcare staff immediately you have returned from visiting [place], [place], [place], [place] or [place]; or traveled in the [place] to [place], [place], [place], or [place]; in the last two weeks or you have been in close contact with a person diagnosed with COVID-19 in the last 2 weeks.   . Tell the health care staff about your symptoms: fever, cough and shortness of breath. . After you have been seen by a medical provider, you will be either: o Tested for (COVID-19) and discharged home on quarantine except to seek medical care if symptoms worsen, and asked to  - Stay home and avoid contact with others until you get your results (4-5 days)  - Avoid travel on public transportation if possible (such as bus, train, or airplane) or o Sent to the Emergency Department by EMS for evaluation, COVID-19 testing, and possible  admission depending on your condition and test results.  What to do if you are LOW RISK for COVID-19?  Reduce your risk of any infection by using the same precautions used for avoiding the common cold or flu:  . Wash your hands often with soap and warm water for at least 20 seconds.  If soap and water are not readily available, use an alcohol-based hand sanitizer with at least 60% alcohol.  . If coughing or sneezing, cover your mouth and nose by coughing or sneezing into the elbow areas of your shirt or coat, into a tissue or into your sleeve (not your hands). . Avoid shaking hands with others and consider head nods or verbal greetings only. . Avoid touching your eyes, nose, or mouth with unwashed hands.  . Avoid close contact with people who are sick. . Avoid places or events with large numbers of people in one location, like concerts or sporting events. . Carefully consider travel plans you have or are making. . If you are planning any travel outside or inside the [place], visit the CDC's Travelers' Health webpage for the latest health notices. . If you have some symptoms but not all symptoms, continue to monitor at home and seek medical attention if your symptoms worsen. . If you are having a medical emergency, call 911.   ADDITIONAL HEALTHCARE OPTIONS FOR PATIENTS  [facility] / e-Visit: [email or web address]         [facility]: [phone]  [facility]: [phone]                   [facility]: [phone]   

## 2018-11-29 NOTE — Progress Notes (Signed)
Radiation Oncology         [phone]) [phone] ________________________________  Name: [name] MRN: [id number]  Date: 11/29/2018  DOB: 20-Dec-1940  Follow-Up Visit Note  CC: Tisovec, Fransico Him, MD  Nancy Marus, MD    ICD-10-CM   1. Malignant neoplasm of corpus uteri, except isthmus [facility])  C54.9 Cytology - PAP    Diagnosis:   78 y.o. female with Recurrent endometrial cancer  Interval Since Last Radiation:  8 years, Completed July 2012: The patient was treated with external beam and intracavitary brachytherapy treatments. She also had treatment to a subcutaneous recurrence in the lower left abdominal area.   Narrative:  The patient returns today for routine follow-up.               This is a 78 y.o.with stage IIIA grade 1endometrial cancer treated on GOG protocol 258.  se was randomized to the arm that received 6 cycles of Taxol carboplatin therapy. Recurrent disease was identified at a laparoscopic port site, pelvic lymph nodes and in the vagina in 2011.  She received additional Taxol carboplatin therapy and adjuvant external beam and vaginal brachytherapy. This treatment was completed in June 2012 and she's been without any evidence of disease since.  HISTORY OF PRESENT ILLNESS: This is a 78 y.o. , who underwent a robotic-assisted laparoscopic hysterectomy, bilateral salpingo- oophorectomy, lymph node dissection in July, 2010. Pathology was consistent with a stage III endometrial cancer with microscopic metastases to the adnexa. She was enrolled in GOG protocol 258 and was randomized to receive 6 cycles of Taxol and carboplatin. Posttreatment CT scan in January 2011 was WNl. At the end of 2011, on routine physical examination, she was found to have  vaginal recurrence. CT imaging at that time confirmed the presence of an obturator mass at the pelvic port site in the left upper quadrant and enlarged obturator nodes. She  received 6 cycles of carboplatin and Taxol therapy. Imaging  demonstrated residual left obturator and inguinal adenopathy and residual soft tissue mass and there was still residual disease in the vagina. She subsequently underwent external beam pelvic radiotherapy along with vaginal brachytherapy and treatment of the port sites. Treatment was completed in June, 2012.   Patient has had two episodes of SBO since pelvic radiotherapy, most recently in January 2015. These were managed with conservative therapy.  PET 01/2013 (at the time of an SBO): without evidence of metastatic disease.  She saw Dr Sondra Come for routine surveillance in July 23rd, 2015. Pap at that visit showed atypical glandular cells with endometrioid phenotype. 11/2013 colposcopy and biopsy   Vagina, biopsy, right - SUPERFICIAL FRAGMENTS OF BENIGN SQUAMOUS EPITHELIUM. - NO MALIGNANCY IDENTIFIED, SEE COMMENT.  Pap 08/2015 with radiation changes.  Has any significant abdominal bloating or pelvic pain vaginal bleeding or incontinence issues.   ALLERGIES:  is allergic to phenobarbital.  Meds: Current Outpatient Medications  Medication Sig Dispense Refill  . aspirin 325 MG tablet Take 325 mg by mouth daily.    Marland Kitchen atorvastatin (LIPITOR) 40 MG tablet TK 1 T PO QHS  11  . benazepril (LOTENSIN) 40 MG tablet Take 40 mg by mouth daily.  1  . calcium carbonate (OS-CAL) 600 MG TABS tablet Take 600 mg by mouth daily with breakfast.    . cetirizine (ZYRTEC) 10 MG tablet Take 10 mg by mouth daily as needed.     . cholecalciferol (VITAMIN D) 1000 UNITS tablet Take 5,000 Units by mouth daily.     Marland Kitchen denosumab (PROLIA) 60 MG/ML  SOLN injection Inject 60 mg into the skin every 6 (six) months. Administer in upper arm, thigh, or abdomen    . diclofenac sodium (VOLTAREN) 1 % GEL Apply 2 g topically daily as needed (for pain).    . ferrous sulfate 325 (65 FE) MG tablet Take 325 mg by mouth daily.     . fexofenadine (ALLEGRA) 180 MG tablet Take 180 mg by mouth daily as needed.     . Folic Acid-Vit G3-TDV  B12 (FOLBEE) 2.5-25-1 MG TABS Take 1 tablet by mouth daily.    Marland Kitchen ibuprofen (ADVIL,MOTRIN) 200 MG tablet Take 200 mg by mouth every 6 (six) hours as needed.    . loratadine (CLARITIN) 10 MG tablet Take 10 mg by mouth daily. Reported on 11/12/2015    . meclizine (ANTIVERT) 25 MG tablet Take 25 mg by mouth 4 (four) times daily as needed for dizziness or nausea.    . Multiple Vitamin (MULTIVITAMIN) tablet Take 1 tablet by mouth daily. Centrum silver    . Omega-3 Fatty Acids (FISH OIL) 1200 MG CAPS Take 1,200 mg by mouth daily.    Vladimir Faster Glycol-Propyl Glycol (SYSTANE) 0.4-0.3 % SOLN Place 1 drop into both eyes daily as needed (for dry eyes).    . polyethylene glycol (MIRALAX / GLYCOLAX) packet Take 17 g by mouth daily.    . promethazine (PHENERGAN) 25 MG tablet Take 25 mg by mouth every 4 (four) hours as needed for nausea or vomiting.     No current facility-administered medications for this encounter.     Physical Findings: The patient is in no acute distress. Patient is alert and oriented.  height is 5\' 4"  (1.626 m) and weight is 157 lb (71.2 kg). Her temporal temperature is 98.5 F (36.9 C). Her blood pressure is 168/75 (abnormal) and her pulse is 87. Her respiration is 18 and oxygen saturation is 97%.   Lungs are clear to auscultation bilaterally. Heart has regular rate and rhythm. No palpable cervical, supraclavicular, or axillary adenopathy. Abdomen soft, non-tender, normal bowel sounds.  On pelvic examination the external genitalia were unremarkable. A speculum exam was performed. There are no mucosal lesions noted in the vaginal vault. A Pap smear was obtained of the proximal vagina. On bimanual and rectovaginal examination there were no pelvic masses appreciated.   She has some hyperpigmentation changes to the left abdominal area from her previous radiation treatment for the port site recurrence.  Lab Findings: Lab Results  Component Value Date   WBC 8.2 05/01/2018   HGB 12.6  05/01/2018   HCT 39.5 05/01/2018   MCV 80.3 05/01/2018   PLT 399.0 05/01/2018    Radiographic Findings: No results found.  Impression:  Recurrent endometrial cancer. No evidence of recurrence on clinical exam, Pap smear pending. Patient will occasionally have episodes of nausea and abdominal pain, with one episode of emesis. She has no further problems. She has not required any ER trips or inpatient evaluation for her prior history of partial small bowel obstruction.   Plan:  Patient will see Dr. Skeet Latch in approximately 6 months. I dicussed that given the interval since her treatment, she does not need to follow up in radiation oncology any longer. She will discuss with Dr. Skeet Latch whether she needs to continue follow-up in gynecologic oncology.  ____________________________________  Blair Promise, PhD, MD  This document serves as a record of services personally performed by Gery Pray, MD. It was created on his behalf by Rae Lips, a trained medical scribe. The  creation of this record is based on the scribe's personal observations and the provider's statements to them. This document has been checked and approved by the attending provider.

## 2018-12-05 LAB — CYTOLOGY - PAP
Diagnosis: UNDETERMINED — AB
HPV: NOT DETECTED

## 2018-12-10 ENCOUNTER — Telehealth: Payer: Self-pay

## 2018-12-10 NOTE — Telephone Encounter (Signed)
Returned pt's call. Pt inquiring about pap results. Per Dr. Sondra Come, he will discuss with Melissa in [place] but stated it was highly likely that pt would continue to surveillance per recommended guidelines. Conveyed to pt that this RN or Dr. Sondra Come would contact her once discussion with Melissa had taken place. Pt verbalized understanding and agreement. Loma Sousa, RN BSN

## 2019-01-05 DIAGNOSIS — Z23 Encounter for immunization: Secondary | ICD-10-CM | POA: Diagnosis not present

## 2019-02-01 DIAGNOSIS — Z1231 Encounter for screening mammogram for malignant neoplasm of breast: Secondary | ICD-10-CM | POA: Diagnosis not present

## 2019-02-01 DIAGNOSIS — Z803 Family history of malignant neoplasm of breast: Secondary | ICD-10-CM | POA: Diagnosis not present

## 2019-03-04 ENCOUNTER — Ambulatory Visit (HOSPITAL_COMMUNITY)
Admission: RE | Admit: 2019-03-04 | Discharge: 2019-03-04 | Disposition: A | Discharge status: Home / Self Care | Payer: Medicare Other | Source: Ambulatory Visit | Attending: Internal Medicine | Admitting: Internal Medicine

## 2019-03-04 ENCOUNTER — Encounter (HOSPITAL_COMMUNITY): Payer: Self-pay

## 2019-03-04 ENCOUNTER — Other Ambulatory Visit: Payer: Self-pay

## 2019-03-04 DIAGNOSIS — M810 Age-related osteoporosis without current pathological fracture: Secondary | ICD-10-CM | POA: Diagnosis not present

## 2019-03-04 HISTORY — DX: Anxiety disorder, unspecified: F41.9

## 2019-03-04 MED ORDER — DENOSUMAB 60 MG/ML ~~LOC~~ SOSY
PREFILLED_SYRINGE | SUBCUTANEOUS | Status: AC
  Filled 2019-03-04: qty 1

## 2019-03-04 MED ORDER — DENOSUMAB 60 MG/ML ~~LOC~~ SOSY
60.0000 mg | PREFILLED_SYRINGE | Freq: Once | SUBCUTANEOUS | Status: AC
  Administered 2019-03-04: 60 mg via SUBCUTANEOUS

## 2019-03-04 NOTE — Discharge Instructions (Signed)
Denosumab injection °What is this medicine? °DENOSUMAB (den oh sue mab) slows bone breakdown. Prolia is used to treat osteoporosis in women after menopause and in men, and in people who are taking corticosteroids for 6 months or more. Xgeva is used to treat a high calcium level due to cancer and to prevent bone fractures and other bone problems caused by multiple myeloma or cancer bone metastases. Xgeva is also used to treat giant cell tumor of the bone. °This medicine may be used for other purposes; ask your health care provider or pharmacist if you have questions. °COMMON BRAND NAME(S): Prolia, XGEVA °What should I tell my health care provider before I take this medicine? °They need to know if you have any of these conditions: °· dental disease °· having surgery or tooth extraction °· infection °· kidney disease °· low levels of calcium or Vitamin D in the blood °· malnutrition °· on hemodialysis °· skin conditions or sensitivity °· thyroid or parathyroid disease °· an unusual reaction to denosumab, other medicines, foods, dyes, or preservatives °· pregnant or trying to get pregnant °· breast-feeding °How should I use this medicine? °This medicine is for injection under the skin. It is given by a health care professional in a hospital or clinic setting. °A special MedGuide will be given to you before each treatment. Be sure to read this information carefully each time. °For Prolia, talk to your pediatrician regarding the use of this medicine in children. Special care may be needed. For Xgeva, talk to your pediatrician regarding the use of this medicine in children. While this drug may be prescribed for children as young as 13 years for selected conditions, precautions do apply. °Overdosage: If you think you have taken too much of this medicine contact a poison control center or emergency room at once. °NOTE: This medicine is only for you. Do not share this medicine with others. °What if I miss a dose? °It is  important not to miss your dose. Call your doctor or health care professional if you are unable to keep an appointment. °What may interact with this medicine? °Do not take this medicine with any of the following medications: °· other medicines containing denosumab °This medicine may also interact with the following medications: °· medicines that lower your chance of fighting infection °· steroid medicines like prednisone or cortisone °This list may not describe all possible interactions. Give your health care provider a list of all the medicines, herbs, non-prescription drugs, or dietary supplements you use. Also tell them if you smoke, drink alcohol, or use illegal drugs. Some items may interact with your medicine. °What should I watch for while using this medicine? °Visit your doctor or health care professional for regular checks on your progress. Your doctor or health care professional may order blood tests and other tests to see how you are doing. °Call your doctor or health care professional for advice if you get a fever, chills or sore throat, or other symptoms of a cold or flu. Do not treat yourself. This drug may decrease your body's ability to fight infection. Try to avoid being around people who are sick. °You should make sure you get enough calcium and vitamin D while you are taking this medicine, unless your doctor tells you not to. Discuss the foods you eat and the vitamins you take with your health care professional. °See your dentist regularly. Brush and floss your teeth as directed. Before you have any dental work done, tell your dentist you are   receiving this medicine. Do not become pregnant while taking this medicine or for 5 months after stopping it. Talk with your doctor or health care professional about your birth control options while taking this medicine. Women should inform their doctor if they wish to become pregnant or think they might be pregnant. There is a potential for serious side  effects to an unborn child. Talk to your health care professional or pharmacist for more information. What side effects may I notice from receiving this medicine? Side effects that you should report to your doctor or health care professional as soon as possible:  allergic reactions like skin rash, itching or hives, swelling of the face, lips, or tongue  bone pain  breathing problems  dizziness  jaw pain, especially after dental work  redness, blistering, peeling of the skin  signs and symptoms of infection like fever or chills; cough; sore throat; pain or trouble passing urine  signs of low calcium like fast heartbeat, muscle cramps or muscle pain; pain, tingling, numbness in the hands or feet; seizures  unusual bleeding or bruising  unusually weak or tired Side effects that usually do not require medical attention (report to your doctor or health care professional if they continue or are bothersome):  constipation  diarrhea  headache  joint pain  loss of appetite  muscle pain  runny nose  tiredness  upset stomach This list may not describe all possible side effects. Call your doctor for medical advice about side effects. You may report side effects to FDA at 1-800-FDA-1088. Where should I keep my medicine? This medicine is only given in a clinic, doctor's office, or other health care setting and will not be stored at home. NOTE: This sheet is a summary. It may not cover all possible information. If you have questions about this medicine, talk to your doctor, pharmacist, or health care provider.  2020 Elsevier/Gold Standard (2017-08-18 16:10:44)

## 2019-03-28 ENCOUNTER — Emergency Department (HOSPITAL_BASED_OUTPATIENT_CLINIC_OR_DEPARTMENT_OTHER): Payer: Medicare Other

## 2019-03-28 ENCOUNTER — Other Ambulatory Visit: Payer: Self-pay

## 2019-03-28 ENCOUNTER — Encounter (HOSPITAL_BASED_OUTPATIENT_CLINIC_OR_DEPARTMENT_OTHER): Payer: Self-pay | Admitting: *Deleted

## 2019-03-28 ENCOUNTER — Emergency Department (HOSPITAL_BASED_OUTPATIENT_CLINIC_OR_DEPARTMENT_OTHER)
Admission: EM | Admit: 2019-03-28 | Discharge: 2019-03-28 | Disposition: A | Discharge status: Home / Self Care | Payer: Medicare Other | Attending: Emergency Medicine | Admitting: Emergency Medicine

## 2019-03-28 DIAGNOSIS — Z888 Allergy status to other drugs, medicaments and biological substances status: Secondary | ICD-10-CM | POA: Diagnosis not present

## 2019-03-28 DIAGNOSIS — R0789 Other chest pain: Secondary | ICD-10-CM | POA: Insufficient documentation

## 2019-03-28 DIAGNOSIS — Z79899 Other long term (current) drug therapy: Secondary | ICD-10-CM | POA: Diagnosis not present

## 2019-03-28 DIAGNOSIS — Z7982 Long term (current) use of aspirin: Secondary | ICD-10-CM | POA: Insufficient documentation

## 2019-03-28 DIAGNOSIS — Z8541 Personal history of malignant neoplasm of cervix uteri: Secondary | ICD-10-CM | POA: Insufficient documentation

## 2019-03-28 DIAGNOSIS — Z87891 Personal history of nicotine dependence: Secondary | ICD-10-CM | POA: Insufficient documentation

## 2019-03-28 DIAGNOSIS — R079 Chest pain, unspecified: Secondary | ICD-10-CM | POA: Diagnosis not present

## 2019-03-28 DIAGNOSIS — N183 Chronic kidney disease, stage 3 unspecified: Secondary | ICD-10-CM | POA: Diagnosis not present

## 2019-03-28 DIAGNOSIS — E782 Mixed hyperlipidemia: Secondary | ICD-10-CM | POA: Insufficient documentation

## 2019-03-28 DIAGNOSIS — I129 Hypertensive chronic kidney disease with stage 1 through stage 4 chronic kidney disease, or unspecified chronic kidney disease: Secondary | ICD-10-CM | POA: Insufficient documentation

## 2019-03-28 DIAGNOSIS — R072 Precordial pain: Secondary | ICD-10-CM | POA: Diagnosis not present

## 2019-03-28 LAB — BASIC METABOLIC PANEL
Anion gap: 8 (ref 5–15)
BUN: 14 mg/dL (ref 8–23)
CO2: 24 mmol/L (ref 22–32)
Calcium: 9.6 mg/dL (ref 8.9–10.3)
Chloride: 108 mmol/L (ref 98–111)
Creatinine, Ser: 1.01 mg/dL — ABNORMAL HIGH (ref 0.44–1.00)
GFR calc Af Amer: >60 mL/min (ref >60)
GFR calc non Af Amer: 53 mL/min — ABNORMAL LOW (ref >60)
Glucose, Bld: 171 mg/dL — ABNORMAL HIGH (ref 70–99)
Potassium: 4 mmol/L (ref 3.5–5.1)
Sodium: 140 mmol/L (ref 135–145)

## 2019-03-28 LAB — CBC
HCT: 43.7 % (ref 36.0–46.0)
Hemoglobin: 14 g/dL (ref 12.0–15.0)
MCH: 30.6 pg (ref 26.0–34.0)
MCHC: 32 g/dL (ref 30.0–36.0)
MCV: 95.4 fL (ref 80.0–100.0)
Platelets: 350 10*3/uL (ref 150–400)
RBC: 4.58 MIL/uL (ref 3.87–5.11)
RDW: 13.7 % (ref 11.5–15.5)
WBC: 6 10*3/uL (ref 4.0–10.5)
nRBC: 0 % (ref 0.0–0.2)

## 2019-03-28 LAB — TROPONIN I (HIGH SENSITIVITY)
Troponin I (High Sensitivity): 6 ng/L (ref <18)
Troponin I (High Sensitivity): 5 ng/L (ref <18)

## 2019-03-28 NOTE — ED Triage Notes (Addendum)
C/o bilateral chest pain w pain in left shoulder radiating down arm,  Some nausea at times,  Denies sob, pain no change w movement,   States has been tired fore some time   Had 325 asa pta

## 2019-03-28 NOTE — ED Provider Notes (Signed)
[facility] EMERGENCY DEPARTMENT Provider Note   CSN: [id number] Arrival date & time: 03/28/19  [id number]     History   Chief Complaint Chief Complaint  Patient presents with  . Chest Pain    HPI [name] is a 78 y.o. female.     The history is provided by the patient.  Chest Pain Pain location:  Substernal area Pain quality: aching and burning   Pain radiates to:  L arm Pain severity:  Mild Onset quality:  Gradual Timing:  Rare Progression:  Resolved Chronicity:  New Context: movement (possibly related to movement or postioning), at rest and stress (hx of anxiety, states ativan helped with pain)   Relieved by: ativan. Worsened by:  Certain positions Associated symptoms: no abdominal pain, no back pain, no cough, no fever, no nausea, no palpitations, no shortness of breath and no vomiting   Risk factors: high cholesterol and hypertension   Risk factors: no coronary artery disease and no diabetes mellitus     Past Medical History:  Diagnosis Date  . Allergy   . Anxiety    takes ativan as needed  . Benign positional vertigo   . Bursitis of shoulder, left   . Cancer [facility])    Endometrial ca/Recurrence  . CKD (chronic kidney disease), stage III   . History of colonic polyps   . Hypercholesteremia   . Hypercholesterolemia   . Hypertension   . Lumbar pain   . Osteoarthritis   . Osteoporosis   . S/P radiation therapy July 29, 2010-Sep 06, 2010   External beam of pelvis  . S/P radiation therapy 09/15/10, 09/24/10, 09/28/2010   Intracavitary brachytherapy  . SBO (small bowel obstruction) ([place])   . Status post chemotherapy    carboplatin/paclitaxel x 6 rounds    Patient Active Problem List   Diagnosis Date Noted  . Essential hypertension, benign 05/18/2013  . SBO (small bowel obstruction) ([place]) 05/17/2013  . Small bowel obstruction ([place]) 05/16/2013  . Malignant neoplasm of corpus uteri, except isthmus ([place]) 05/16/2011    Past Surgical History:   Procedure Laterality Date  . ABDOMINAL HYSTERECTOMY    . BREAST LUMPECTOMY  1993   Left breast, benign  . ROBOTIC ASSISTED LAPAROSCOPIC VAGINAL HYSTERECTOMY WITH FIBROID REMOVAL  July 2010   bilat. salpingo-oophorectomy     OB History   No obstetric history on file.      Home Medications    Prior to Admission medications   Medication Sig Start Date End Date Taking? Authorizing Provider  aspirin 325 MG tablet Take 325 mg by mouth daily.    [provider]  atorvastatin (LIPITOR) 40 MG tablet TK 1 T PO QHS 11/05/15   [provider]  benazepril (LOTENSIN) 40 MG tablet Take 40 mg by mouth daily. 01/06/18   [provider]  calcium carbonate (OS-CAL) 600 MG TABS tablet Take 600 mg by mouth daily with breakfast.    [provider]  cetirizine (ZYRTEC) 10 MG tablet Take 10 mg by mouth daily as needed.     [provider]  cholecalciferol (VITAMIN D) 1000 UNITS tablet Take 5,000 Units by mouth daily.     [provider]  denosumab (PROLIA) 60 MG/ML SOLN injection Inject 60 mg into the skin every 6 (six) months. Administer in upper arm, thigh, or abdomen    [provider]  diclofenac sodium (VOLTAREN) 1 % GEL Apply 2 g topically daily as needed (for pain).    [provider]  ferrous sulfate 325 (65 FE) MG tablet Take 325 mg by mouth daily.     [provider]  fexofenadine (ALLEGRA) 180 MG tablet Take 180 mg by mouth daily as needed.     [provider]  Folic Acid-Vit [id number] [id number] (FOLBEE) 2.5-25-1 MG TABS Take 1 tablet by mouth daily.    [provider]  ibuprofen (ADVIL,MOTRIN) 200 MG tablet Take 200 mg by mouth every 6 (six) hours as needed.    [provider]  loratadine (CLARITIN) 10 MG tablet Take 10 mg by mouth daily. Reported on 11/12/2015    [provider]  LORazepam (ATIVAN) 0.5 MG tablet Take 0.25 mg by mouth as needed for anxiety.    [provider]   meclizine (ANTIVERT) 25 MG tablet Take 25 mg by mouth 4 (four) times daily as needed for dizziness or nausea.    [provider]  Multiple Vitamin (MULTIVITAMIN) tablet Take 1 tablet by mouth daily. Centrum silver    [provider]  Omega-3 Fatty Acids (FISH OIL) 1200 MG CAPS Take 1,200 mg by mouth daily.    [provider]  Polyethyl Glycol-Propyl Glycol (SYSTANE) 0.4-0.3 % SOLN Place 1 drop into both eyes daily as needed (for dry eyes).    [provider]  polyethylene glycol (MIRALAX / GLYCOLAX) packet Take 17 g by mouth daily.    [provider]  promethazine (PHENERGAN) 25 MG tablet Take 25 mg by mouth every 4 (four) hours as needed for nausea or vomiting.    [provider]    Family History Family History  Problem Relation Age of Onset  . Breast cancer Mother   . Heart disease Father   . Breast cancer Sister   . Diabetes Sister   . Colon cancer Neg Hx   . Esophageal cancer Neg Hx   . Rectal cancer Neg Hx   . Stomach cancer Neg Hx     Social History Social History   Tobacco Use  . Smoking status: Former Smoker    Packs/day: 1.00    Types: Cigarettes    Quit date: 05/12/1976    Years since quitting: 42.9  . Smokeless tobacco: Never Used  Substance Use Topics  . Alcohol use: No  . Drug use: No     Allergies   Phenobarbital   Review of Systems Review of Systems  Constitutional: Negative for chills and fever.  HENT: Negative for ear pain and sore throat.   Eyes: Negative for pain and visual disturbance.  Respiratory: Negative for cough and shortness of breath.   Cardiovascular: Positive for chest pain. Negative for palpitations.  Gastrointestinal: Negative for abdominal pain, nausea and vomiting.  Genitourinary: Negative for dysuria and hematuria.  Musculoskeletal: Negative for arthralgias and back pain.  Skin: Negative for color change and rash.  Neurological: Negative for seizures and syncope.  All other  systems reviewed and are negative.    Physical Exam Updated Vital Signs  ED Triage Vitals  Enc Vitals Group     BP 03/28/19 0836 123/80     Pulse Rate 03/28/19 0836 (!) 101     Resp 03/28/19 0836 17     Temp 03/28/19 0836 98.3 F (36.8 C)     Temp Source 03/28/19 0836 Oral     SpO2 03/28/19 0836 100 %     Weight --      Height --      Head Circumference --      Peak Flow --  Pain Score 03/28/19 0833 8     Pain Loc --      Pain Edu? --      Excl. in [place]? --     Physical Exam Vitals signs and nursing note reviewed.  Constitutional:      General: She is not in acute distress.    Appearance: She is well-developed.  HENT:     Head: Normocephalic and atraumatic.  Eyes:     Extraocular Movements: Extraocular movements intact.     Conjunctiva/sclera: Conjunctivae normal.     Pupils: Pupils are equal, round, and reactive to light.  Neck:     Musculoskeletal: Normal range of motion and neck supple.  Cardiovascular:     Rate and Rhythm: Normal rate and regular rhythm.     Pulses:          Radial pulses are 2+ on the right side and 2+ on the left side.     Heart sounds: Normal heart sounds. No murmur.  Pulmonary:     Effort: Pulmonary effort is normal. No respiratory distress.     Breath sounds: Normal breath sounds. No decreased breath sounds.  Abdominal:     Palpations: Abdomen is soft.     Tenderness: There is no abdominal tenderness.  Musculoskeletal: Normal range of motion.  Skin:    General: Skin is warm and dry.     Capillary Refill: Capillary refill takes less than 2 seconds.  Neurological:     General: No focal deficit present.     Mental Status: She is alert.  Psychiatric:        Mood and Affect: Mood normal.      ED Treatments / Results  Labs (all labs ordered are listed, but only abnormal results are displayed) Labs Reviewed  BASIC METABOLIC PANEL - Abnormal; Notable for the following components:      Result Value   Glucose, Bld 171 (*)     Creatinine, Ser 1.01 (*)    GFR calc non Af Amer 53 (*)    All other components within normal limits  CBC  TROPONIN I (HIGH SENSITIVITY)  TROPONIN I (HIGH SENSITIVITY)    EKG EKG Interpretation  Date/Time:  Thursday March 28 2019 08:34:07 EST Ventricular Rate:  99 PR Interval:    QRS Duration: 72 QT Interval:  336 QTC Calculation: 432 R Axis:   19 Text Interpretation: Sinus tachycardia Multiple ventricular premature complexes Low voltage, precordial leads Baseline wander in lead(s) I III aVL V1 Confirmed by Lennice Sites [phone]) on 03/28/2019 8:53:06 AM   Radiology Dg Chest 2 View  Result Date: 03/28/2019 CLINICAL DATA:  C/o bilateral chest pain w pain in left shoulder radiating down arm, Some nausea at times EXAM: CHEST - 2 VIEW COMPARISON:  11/16/2009 FINDINGS: Cardiac silhouette is normal in size. No mediastinal or hilar masses. No evidence of adenopathy. Clear lungs.  No pleural effusion or pneumothorax. Stable pectus excavatum.  Skeletal structures are intact. IMPRESSION: No active cardiopulmonary disease. Electronically Signed   By: Lajean Manes M.D.   On: 03/28/2019 09:08    Procedures Procedures (including critical care time)  Medications Ordered in ED Medications - No data to display   Initial Impression / Assessment and Plan / ED Course  I have reviewed the triage vital signs and the nursing notes.  Pertinent labs & imaging results that were available during my care of the patient were reviewed by me and considered in my medical decision making (see chart for details).  [name] is a 78 year old female with history of high cholesterol, hypertension, chronic abdominal issues, anxiety who presents to the ED with chest pain.  Pain occurred yesterday was overall atypical.  Got better with Ativan.  Felt that she has some muscular pain in the chest as it was gotten worse with movement.  Did not appear exertional.  Did not have any diaphoresis.  No active  chest pain now.  Vitals are unremarkable.  No fever.  Patient overall appears well.  Clear breath sounds.  No signs of infection on chest x-ray.  EKG shows sinus rhythm.  No ischemic changes.  Troponin negative x2.  Heart score is 3 and doubt ACS.  No significant anemia, electrolyte abnormality, kidney injury.  No concern for PE.  No concern for infectious process.  Possibly reflux related versus MSK versus anxiety.  Given return precautions.  Recommend follow-up with primary care doctor and discharged from ED in good condition.  This chart was dictated using voice recognition software.  Despite best efforts to proofread,  errors can occur which can change the documentation meaning.    Final Clinical Impressions(s) / ED Diagnoses   Final diagnoses:  Atypical chest pain    ED Discharge Orders    None       Lennice Sites, DO 03/28/19 1208

## 2019-03-28 NOTE — Discharge Instructions (Addendum)
Follow-up with your primary care doctor as discussed.  Please return to the ED if symptoms worsen.

## 2019-04-01 DIAGNOSIS — M810 Age-related osteoporosis without current pathological fracture: Secondary | ICD-10-CM | POA: Diagnosis not present

## 2019-04-01 DIAGNOSIS — E7800 Pure hypercholesterolemia, unspecified: Secondary | ICD-10-CM | POA: Diagnosis not present

## 2019-04-01 DIAGNOSIS — E119 Type 2 diabetes mellitus without complications: Secondary | ICD-10-CM | POA: Diagnosis not present

## 2019-04-02 DIAGNOSIS — R82998 Other abnormal findings in urine: Secondary | ICD-10-CM | POA: Diagnosis not present

## 2019-09-05 ENCOUNTER — Other Ambulatory Visit (HOSPITAL_COMMUNITY): Payer: Self-pay | Admitting: *Deleted

## 2019-09-06 ENCOUNTER — Ambulatory Visit (HOSPITAL_COMMUNITY)
Admission: RE | Admit: 2019-09-06 | Discharge: 2019-09-06 | Disposition: A | Discharge status: Home / Self Care | Payer: Medicare Other | Source: Ambulatory Visit | Attending: Internal Medicine | Admitting: Internal Medicine

## 2019-09-06 ENCOUNTER — Other Ambulatory Visit: Payer: Self-pay

## 2019-09-06 DIAGNOSIS — M810 Age-related osteoporosis without current pathological fracture: Secondary | ICD-10-CM | POA: Insufficient documentation

## 2019-09-06 MED ORDER — DENOSUMAB 60 MG/ML ~~LOC~~ SOSY
PREFILLED_SYRINGE | SUBCUTANEOUS | Status: AC
  Filled 2019-09-06: qty 1

## 2019-09-06 MED ORDER — DENOSUMAB 60 MG/ML ~~LOC~~ SOSY
60.0000 mg | PREFILLED_SYRINGE | Freq: Once | SUBCUTANEOUS | Status: AC
  Administered 2019-09-06: 60 mg via SUBCUTANEOUS

## 2019-09-10 ENCOUNTER — Emergency Department (HOSPITAL_BASED_OUTPATIENT_CLINIC_OR_DEPARTMENT_OTHER)
Admission: EM | Admit: 2019-09-10 | Discharge: 2019-09-10 | Disposition: A | Discharge status: Home / Self Care | Payer: Medicare Other | Attending: Emergency Medicine | Admitting: Emergency Medicine

## 2019-09-10 ENCOUNTER — Other Ambulatory Visit: Payer: Self-pay

## 2019-09-10 ENCOUNTER — Encounter (HOSPITAL_BASED_OUTPATIENT_CLINIC_OR_DEPARTMENT_OTHER): Payer: Self-pay | Admitting: *Deleted

## 2019-09-10 ENCOUNTER — Emergency Department (HOSPITAL_BASED_OUTPATIENT_CLINIC_OR_DEPARTMENT_OTHER): Payer: Medicare Other

## 2019-09-10 DIAGNOSIS — M25511 Pain in right shoulder: Secondary | ICD-10-CM | POA: Diagnosis not present

## 2019-09-10 DIAGNOSIS — Z79899 Other long term (current) drug therapy: Secondary | ICD-10-CM | POA: Diagnosis not present

## 2019-09-10 DIAGNOSIS — S46911A Strain of unspecified muscle, fascia and tendon at shoulder and upper arm level, right arm, initial encounter: Secondary | ICD-10-CM | POA: Diagnosis not present

## 2019-09-10 DIAGNOSIS — Y999 Unspecified external cause status: Secondary | ICD-10-CM | POA: Insufficient documentation

## 2019-09-10 DIAGNOSIS — Z888 Allergy status to other drugs, medicaments and biological substances status: Secondary | ICD-10-CM | POA: Insufficient documentation

## 2019-09-10 DIAGNOSIS — I129 Hypertensive chronic kidney disease with stage 1 through stage 4 chronic kidney disease, or unspecified chronic kidney disease: Secondary | ICD-10-CM | POA: Insufficient documentation

## 2019-09-10 DIAGNOSIS — X509XXA Other and unspecified overexertion or strenuous movements or postures, initial encounter: Secondary | ICD-10-CM | POA: Insufficient documentation

## 2019-09-10 DIAGNOSIS — Z87891 Personal history of nicotine dependence: Secondary | ICD-10-CM | POA: Diagnosis not present

## 2019-09-10 DIAGNOSIS — Y9389 Activity, other specified: Secondary | ICD-10-CM | POA: Insufficient documentation

## 2019-09-10 DIAGNOSIS — Z7982 Long term (current) use of aspirin: Secondary | ICD-10-CM | POA: Insufficient documentation

## 2019-09-10 DIAGNOSIS — N183 Chronic kidney disease, stage 3 unspecified: Secondary | ICD-10-CM | POA: Diagnosis not present

## 2019-09-10 DIAGNOSIS — Y92810 Car as the place of occurrence of the external cause: Secondary | ICD-10-CM | POA: Insufficient documentation

## 2019-09-10 DIAGNOSIS — E782 Mixed hyperlipidemia: Secondary | ICD-10-CM | POA: Diagnosis not present

## 2019-09-10 DIAGNOSIS — S4991XA Unspecified injury of right shoulder and upper arm, initial encounter: Secondary | ICD-10-CM | POA: Diagnosis present

## 2019-09-10 NOTE — Discharge Instructions (Addendum)
Recommend follow-up either with your primary doctor or with the sports medicine specialist Dr. Raeford Razor in our building.  Recommend continue the previously prescribed Flexeril as needed as well as Tylenol.  Also would consider adding either Motrin or naproxen as needed.

## 2019-09-10 NOTE — ED Triage Notes (Signed)
Right shoulder pain x 2 weeks. This week she helped an elderly lady put her walker in her trunk. Pain got worse after lifting. No limitation in movement.

## 2019-09-11 NOTE — ED Provider Notes (Signed)
[place] EMERGENCY DEPARTMENT Provider Note   CSN: [id number] Arrival date & time: 09/10/19  1732     History Chief Complaint  Patient presents with  . Shoulder Pain    [name] is a 79 y.o. female.  Presents to ER with concern for strain of her right shoulder.  States she has been having intermittent right shoulder pain for the past couple weeks.  She recently was helping an elderly lady put her walker in her trunk when she thinks that she may have strained her right shoulder.  Has been having dull achy pain since.  Worse with movement, but reports full range of motion.  Currently at rest pain is relatively mild, nonradiating.  Has not had any recent x-rays.  No numbness, weakness.  HPI     Past Medical History:  Diagnosis Date  . Allergy   . Anxiety    takes ativan as needed  . Benign positional vertigo   . Bursitis of shoulder, left   . Cancer [facility])    Endometrial ca/Recurrence  . CKD (chronic kidney disease), stage III   . History of colonic polyps   . Hypercholesteremia   . Hypercholesterolemia   . Hypertension   . Lumbar pain   . Osteoarthritis   . Osteoporosis   . S/P radiation therapy July 29, 2010-Sep 06, 2010   External beam of pelvis  . S/P radiation therapy 09/15/10, 09/24/10, 09/28/2010   Intracavitary brachytherapy  . SBO (small bowel obstruction) ([place])   . Status post chemotherapy    carboplatin/paclitaxel x 6 rounds    Patient Active Problem List   Diagnosis Date Noted  . Essential hypertension, benign 05/18/2013  . SBO (small bowel obstruction) ([place]) 05/17/2013  . Small bowel obstruction ([place]) 05/16/2013  . Malignant neoplasm of corpus uteri, except isthmus ([place]) 05/16/2011    Past Surgical History:  Procedure Laterality Date  . ABDOMINAL HYSTERECTOMY    . BREAST LUMPECTOMY  1993   Left breast, benign  . ROBOTIC ASSISTED LAPAROSCOPIC VAGINAL HYSTERECTOMY WITH FIBROID REMOVAL  July 2010   bilat. salpingo-oophorectomy     OB  History   No obstetric history on file.     Family History  Problem Relation Age of Onset  . Breast cancer Mother   . Heart disease Father   . Breast cancer Sister   . Diabetes Sister   . Colon cancer Neg Hx   . Esophageal cancer Neg Hx   . Rectal cancer Neg Hx   . Stomach cancer Neg Hx     Social History   Tobacco Use  . Smoking status: Former Smoker    Packs/day: 1.00    Types: Cigarettes    Quit date: 05/12/1976    Years since quitting: 43.3  . Smokeless tobacco: Never Used  Substance Use Topics  . Alcohol use: No  . Drug use: No    Home Medications Prior to Admission medications   Medication Sig Start Date End Date Taking? Authorizing Provider  aspirin 325 MG tablet Take 325 mg by mouth daily.   Yes [provider]  atorvastatin (LIPITOR) 40 MG tablet TK 1 T PO QHS 11/05/15  Yes [provider]  benazepril (LOTENSIN) 40 MG tablet Take 40 mg by mouth daily. 01/06/18  Yes [provider]  calcium carbonate (OS-CAL) 600 MG TABS tablet Take 600 mg by mouth daily with breakfast.   Yes [provider]  cetirizine (ZYRTEC) 10 MG tablet Take 10 mg by mouth daily as  needed.    Yes [provider]  cholecalciferol (VITAMIN D) 1000 UNITS tablet Take 5,000 Units by mouth daily.    Yes [provider]  denosumab (PROLIA) 60 MG/ML SOLN injection Inject 60 mg into the skin every 6 (six) months. Administer in upper arm, thigh, or abdomen   Yes [provider]  diclofenac sodium (VOLTAREN) 1 % GEL Apply 2 g topically daily as needed (for pain).   Yes [provider]  ferrous sulfate 325 (65 FE) MG tablet Take 325 mg by mouth daily.    Yes [provider]  fexofenadine (ALLEGRA) 180 MG tablet Take 180 mg by mouth daily as needed.    Yes [provider]  Folic Acid-Vit Q000111Q 123456 (FOLBEE) 2.5-25-1 MG TABS Take 1 tablet by mouth daily.   Yes [provider]  ibuprofen (ADVIL,MOTRIN) 200 MG  tablet Take 200 mg by mouth every 6 (six) hours as needed.   Yes [provider]  loratadine (CLARITIN) 10 MG tablet Take 10 mg by mouth daily. Reported on 11/12/2015   Yes [provider]  LORazepam (ATIVAN) 0.5 MG tablet Take 0.25 mg by mouth as needed for anxiety.   Yes [provider]  meclizine (ANTIVERT) 25 MG tablet Take 25 mg by mouth 4 (four) times daily as needed for dizziness or nausea.   Yes [provider]  Multiple Vitamin (MULTIVITAMIN) tablet Take 1 tablet by mouth daily. Centrum silver   Yes [provider]  Omega-3 Fatty Acids (FISH OIL) 1200 MG CAPS Take 1,200 mg by mouth daily.   Yes [provider]  Polyethyl Glycol-Propyl Glycol (SYSTANE) 0.4-0.3 % SOLN Place 1 drop into both eyes daily as needed (for dry eyes).   Yes [provider]  polyethylene glycol (MIRALAX / GLYCOLAX) packet Take 17 g by mouth daily.   Yes [provider]  promethazine (PHENERGAN) 25 MG tablet Take 25 mg by mouth every 4 (four) hours as needed for nausea or vomiting.   Yes [provider]    Allergies    Phenobarbital  Review of Systems   Review of Systems  Constitutional: Negative for chills and fever.  HENT: Negative for ear pain and sore throat.   Eyes: Negative for pain and visual disturbance.  Respiratory: Negative for cough and shortness of breath.   Cardiovascular: Negative for chest pain and palpitations.  Gastrointestinal: Negative for abdominal pain and vomiting.  Genitourinary: Negative for dysuria and hematuria.  Musculoskeletal: Positive for arthralgias. Negative for back pain.  Skin: Negative for color change and rash.  Neurological: Negative for seizures and syncope.  All other systems reviewed and are negative.   Physical Exam Updated Vital Signs BP (!) 179/87 (BP Location: Left Arm)   Pulse 88   Temp 98.2 F (36.8 C) (Oral)   Resp 16   Ht 5\' 4"  (1.626 m)   Wt 70.8 kg   SpO2 99%   BMI  26.78 kg/m   Physical Exam Constitutional:      Appearance: Normal appearance.  HENT:     Head: Normocephalic and atraumatic.     Nose: Nose normal.     Mouth/Throat:     Mouth: Mucous membranes are moist.     Pharynx: Oropharynx is clear.  Eyes:     Extraocular Movements: Extraocular movements intact.  Cardiovascular:     Rate and Rhythm: Normal rate and regular rhythm.     Pulses: Normal pulses.  Pulmonary:     Effort: Pulmonary effort  is normal. No respiratory distress.  Musculoskeletal:     Cervical back: Normal range of motion.     Comments: RUE: no deformity, normal ROM, there is some TTP over lateral and posterior shoulder, distal sensation, motor, radial pulse intact  Neurological:     Mental Status: She is alert.     ED Results / Procedures / Treatments   Labs (all labs ordered are listed, but only abnormal results are displayed) Labs Reviewed - No data to display  EKG None  Radiology DG Shoulder Right  Result Date: 09/10/2019 CLINICAL DATA:  Right shoulder pain for 2 weeks. Pain worse after recent lifting. EXAM: RIGHT SHOULDER - 2+ VIEW COMPARISON:  None. FINDINGS: There is no evidence of fracture or dislocation. There is no evidence of arthropathy or other focal bone abnormality. Soft tissues are unremarkable. IMPRESSION: Unremarkable radiographs of the right shoulder. Electronically Signed   By: Keith Rake M.D.   On: 09/10/2019 18:07    Procedures Procedures (including critical care time)  Medications Ordered in ED Medications - No data to display  ED Course  I have reviewed the triage vital signs and the nursing notes.  Pertinent labs & imaging results that were available during my care of the patient were reviewed by me and considered in my medical decision making (see chart for details).    MDM Rules/Calculators/A&P                      79 year old lady presents here with concern for possible right shoulder injury.  There is no obvious  deformity on exam but did note some tenderness.  X-rays negative for fracture, dislocation.  Suspect MSK strain.  Patient already has Rx for Flexeril.  Recommended Tylenol and as needed NSAIDs.  Provided information for sports medicine follow-up.    After the discussed management above, the patient was determined to be safe for discharge.  The patient was in agreement with this plan and all questions regarding their care were answered.  ED return precautions were discussed and the patient will return to the ED with any significant worsening of condition.   Final Clinical Impression(s) / ED Diagnoses Final diagnoses:  Shoulder strain, right, initial encounter    Rx / DC Orders ED Discharge Orders    None       Lucrezia Starch, MD 09/11/19 1214

## 2019-09-12 ENCOUNTER — Ambulatory Visit (INDEPENDENT_AMBULATORY_CARE_PROVIDER_SITE_OTHER): Payer: Medicare Other | Admitting: Family Medicine

## 2019-09-12 ENCOUNTER — Other Ambulatory Visit: Payer: Self-pay

## 2019-09-12 ENCOUNTER — Encounter: Payer: Self-pay | Admitting: Family Medicine

## 2019-09-12 ENCOUNTER — Telehealth: Payer: Self-pay | Admitting: Family Medicine

## 2019-09-12 VITALS — BP 179/85 | Ht 64.0 in | Wt 156.0 lb

## 2019-09-12 DIAGNOSIS — M5412 Radiculopathy, cervical region: Secondary | ICD-10-CM | POA: Diagnosis not present

## 2019-09-12 NOTE — Patient Instructions (Signed)
Nice to meet you  Please take the Rayos as close to 10 pm as you can. Take it until it runs out. Please stop the other medicines while taking Rayos. You can restart them when you finish the bottle.  Please try the exercises  Physical therapy will give you a call  Please send me a message in MyChart with any questions or updates.  Please see me back in 4 weeks.   --Dr. Raeford Razor

## 2019-09-12 NOTE — Telephone Encounter (Signed)
Patient wants to know if she can take any medication for pain before beginning Rayos tonight at 10pm

## 2019-09-12 NOTE — Assessment & Plan Note (Signed)
She is having periscapular pain as well as radicular symptoms down the arm.  She is flexed at the neck as well as posteriorly concave. -Counseled on home exercise therapy and supportive care. - Rayos samples. -Can restart Aleve and Flexeril once Rayos completed  -Referral to physical therapy. -Could consider imaging or trigger point injections

## 2019-09-12 NOTE — Telephone Encounter (Signed)
Spoke with patient about med question.   Rosemarie Ax, MD [facility] 09/12/2019, 1:58 PM

## 2019-09-12 NOTE — Progress Notes (Signed)
[name] - 79 y.o. female MRN [id number]  Date of birth: July 30, 1940  SUBJECTIVE:  Including CC & ROS.  Chief Complaint  Patient presents with  . Shoulder Pain    right    [name] is a 79 y.o. female that is presenting with right arm pain.  The pain is been ongoing for about a week.  She describes the pain in the periscapular region on the right and extends down the ulnar aspect of the arm and into the hand.  She has been more active with helping her family members.  She has not been driving to [place].  She still works from home.  She works on a Teaching laboratory technician most days.  She has been trying Flexeril and Aleve.  This seemed to help.  No history of surgery or injury..   Independent review of the right shoulder x-ray from 5/18 shows no acute changes of the right shoulder.   Review of Systems See HPI   HISTORY: Past Medical, Surgical, Social, and Family History Reviewed & Updated per EMR.   Pertinent Historical Findings include:  Past Medical History:  Diagnosis Date  . Allergy   . Anxiety    takes ativan as needed  . Benign positional vertigo   . Bursitis of shoulder, left   . Cancer [facility])    Endometrial ca/Recurrence  . CKD (chronic kidney disease), stage III   . History of colonic polyps   . Hypercholesteremia   . Hypercholesterolemia   . Hypertension   . Lumbar pain   . Osteoarthritis   . Osteoporosis   . S/P radiation therapy July 29, 2010-Sep 06, 2010   External beam of pelvis  . S/P radiation therapy 09/15/10, 09/24/10, 09/28/2010   Intracavitary brachytherapy  . SBO (small bowel obstruction) ([place])   . Status post chemotherapy    carboplatin/paclitaxel x 6 rounds    Past Surgical History:  Procedure Laterality Date  . ABDOMINAL HYSTERECTOMY    . BREAST LUMPECTOMY  1993   Left breast, benign  . ROBOTIC ASSISTED LAPAROSCOPIC VAGINAL HYSTERECTOMY WITH FIBROID REMOVAL  July 2010   bilat. salpingo-oophorectomy    Family History  Problem Relation Age of  Onset  . Breast cancer Mother   . Heart disease Father   . Breast cancer Sister   . Diabetes Sister   . Colon cancer Neg Hx   . Esophageal cancer Neg Hx   . Rectal cancer Neg Hx   . Stomach cancer Neg Hx     Social History   Socioeconomic History  . Marital status: Widowed    Spouse name: Not on file  . Number of children: Not on file  . Years of education: Not on file  . Highest education level: Not on file  Occupational History  . Not on file  Tobacco Use  . Smoking status: Former Smoker    Packs/day: 1.00    Types: Cigarettes    Quit date: 05/12/1976    Years since quitting: 43.3  . Smokeless tobacco: Never Used  Substance and Sexual Activity  . Alcohol use: No  . Drug use: No  . Sexual activity: Never  Other Topics Concern  . Not on file  Social History Narrative  . Not on file   Social Determinants of Health   Financial Resource Strain:   . Difficulty of Paying Living Expenses:   Food Insecurity:   . Worried About Charity fundraiser in the Last Year:   . Arboriculturist in  the Last Year:   Transportation Needs:   . Film/video editor (Medical):   Marland Kitchen Lack of Transportation (Non-Medical):   Physical Activity:   . Days of Exercise per Week:   . Minutes of Exercise per Session:   Stress:   . Feeling of Stress :   Social Connections:   . Frequency of Communication with Friends and Family:   . Frequency of Social Gatherings with Friends and Family:   . Attends Religious Services:   . Active Member of Clubs or Organizations:   . Attends Archivist Meetings:   Marland Kitchen Marital Status:   Intimate Partner Violence:   . Fear of Current or Ex-Partner:   . Emotionally Abused:   Marland Kitchen Physically Abused:   . Sexually Abused:      PHYSICAL EXAM:  VS: BP (!) 179/85   Ht 5\' 4"  (1.626 m)   Wt 156 lb (70.8 kg)   BMI 26.78 kg/m  Physical Exam Gen: NAD, alert, cooperative with exam, well-appearing MSK:  Right shoulder: Normal active flexion  abduction. Normal internal and external rotation. Normal empty can testing. Normal speeds test. Normal O'Brien's test. Neck: Holding neck in flexed position. Limited extension. Limited lateral rotation to the right. Normal right lateral rotation to the left. Positive Spurling's test. Neurovascularly intact     ASSESSMENT & PLAN:   Cervical radiculopathy She is having periscapular pain as well as radicular symptoms down the arm.  She is flexed at the neck as well as posteriorly concave. -Counseled on home exercise therapy and supportive care. - Rayos samples. -Can restart Aleve and Flexeril once Rayos completed  -Referral to physical therapy. -Could consider imaging or trigger point injections

## 2019-09-17 ENCOUNTER — Telehealth: Payer: Self-pay | Admitting: Family Medicine

## 2019-09-17 NOTE — Telephone Encounter (Signed)
Patient called stating she is still experiencing pain in her shoulder. She has finished the Rayos and had little relief from the medication. She has been icing her shoulder as well as taking aleve and using Salonpas Patches. Patient is asking what else she can do to help with the pain.

## 2019-09-18 ENCOUNTER — Encounter: Payer: Self-pay | Admitting: Family Medicine

## 2019-09-18 ENCOUNTER — Ambulatory Visit (INDEPENDENT_AMBULATORY_CARE_PROVIDER_SITE_OTHER): Payer: Medicare Other | Admitting: Family Medicine

## 2019-09-18 ENCOUNTER — Other Ambulatory Visit: Payer: Self-pay

## 2019-09-18 DIAGNOSIS — M5412 Radiculopathy, cervical region: Secondary | ICD-10-CM

## 2019-09-18 DIAGNOSIS — M7918 Myalgia, other site: Secondary | ICD-10-CM | POA: Diagnosis not present

## 2019-09-18 MED ORDER — METHYLPREDNISOLONE ACETATE 40 MG/ML IJ SUSP
40.0000 mg | Freq: Once | INTRAMUSCULAR | Status: AC
  Administered 2019-09-18: 40 mg via INTRA_ARTICULAR

## 2019-09-18 NOTE — Assessment & Plan Note (Signed)
Pain is periscapular in nature of trigger points appreciated on exam. -Trigger point injections of the trapezius and rhomboid. -Counseled on home exercise therapy and supportive care.

## 2019-09-18 NOTE — Telephone Encounter (Signed)
Spoke with patient about ongoing pain. Will try either trigger point injection or shoulder injection.   Rosemarie Ax, MD [facility] 09/18/2019, 9:02 AM

## 2019-09-18 NOTE — Patient Instructions (Addendum)
Good to see you Please try heat  Please try a thera cane   Please let me know if you still have pain and we could consider gabapentin to help with the nerve type pain.  Please send me a message in MyChart with any questions or updates.  Please see me back as scheduled.   --Dr. Raeford Razor

## 2019-09-18 NOTE — Telephone Encounter (Signed)
Patient has been scheduled for an injection

## 2019-09-18 NOTE — Progress Notes (Signed)
[name] - 79 y.o. female MRN [id number]  Date of birth: 03-Jan-1941  SUBJECTIVE:  Including CC & ROS.  Chief Complaint  Patient presents with  . Shoulder Pain    right    [name] is a 79 y.o. female that is following up for her right sided periscapular pain.  She has had limited improvement with treatment thus far.  Seems to be occurring over the trapezius as well as the medial border of the right scapula.   Review of Systems See HPI   HISTORY: Past Medical, Surgical, Social, and Family History Reviewed & Updated per EMR.   Pertinent Historical Findings include:  Past Medical History:  Diagnosis Date  . Allergy   . Anxiety    takes ativan as needed  . Benign positional vertigo   . Bursitis of shoulder, left   . Cancer [facility])    Endometrial ca/Recurrence  . CKD (chronic kidney disease), stage III   . History of colonic polyps   . Hypercholesteremia   . Hypercholesterolemia   . Hypertension   . Lumbar pain   . Osteoarthritis   . Osteoporosis   . S/P radiation therapy July 29, 2010-Sep 06, 2010   External beam of pelvis  . S/P radiation therapy 09/15/10, 09/24/10, 09/28/2010   Intracavitary brachytherapy  . SBO (small bowel obstruction) ([place])   . Status post chemotherapy    carboplatin/paclitaxel x 6 rounds    Past Surgical History:  Procedure Laterality Date  . ABDOMINAL HYSTERECTOMY    . BREAST LUMPECTOMY  1993   Left breast, benign  . ROBOTIC ASSISTED LAPAROSCOPIC VAGINAL HYSTERECTOMY WITH FIBROID REMOVAL  July 2010   bilat. salpingo-oophorectomy    Family History  Problem Relation Age of Onset  . Breast cancer Mother   . Heart disease Father   . Breast cancer Sister   . Diabetes Sister   . Colon cancer Neg Hx   . Esophageal cancer Neg Hx   . Rectal cancer Neg Hx   . Stomach cancer Neg Hx     Social History   Socioeconomic History  . Marital status: Widowed    Spouse name: Not on file  . Number of children: Not on file  . Years of  education: Not on file  . Highest education level: Not on file  Occupational History  . Not on file  Tobacco Use  . Smoking status: Former Smoker    Packs/day: 1.00    Types: Cigarettes    Quit date: 05/12/1976    Years since quitting: 43.3  . Smokeless tobacco: Never Used  Substance and Sexual Activity  . Alcohol use: No  . Drug use: No  . Sexual activity: Never  Other Topics Concern  . Not on file  Social History Narrative  . Not on file   Social Determinants of Health   Financial Resource Strain:   . Difficulty of Paying Living Expenses:   Food Insecurity:   . Worried About Charity fundraiser in the Last Year:   . Arboriculturist in the Last Year:   Transportation Needs:   . Film/video editor (Medical):   Marland Kitchen Lack of Transportation (Non-Medical):   Physical Activity:   . Days of Exercise per Week:   . Minutes of Exercise per Session:   Stress:   . Feeling of Stress :   Social Connections:   . Frequency of Communication with Friends and Family:   . Frequency of Social Gatherings with Friends and Family:   .  Attends Religious Services:   . Active Member of Clubs or Organizations:   . Attends Archivist Meetings:   Marland Kitchen Marital Status:   Intimate Partner Violence:   . Fear of Current or Ex-Partner:   . Emotionally Abused:   Marland Kitchen Physically Abused:   . Sexually Abused:      PHYSICAL EXAM:  VS: Ht 5\' 5"  (1.651 m)   Wt 156 lb (70.8 kg)   BMI 25.96 kg/m  Physical Exam Gen: NAD, alert, cooperative with exam, well-appearing   Aspiration/Injection Procedure Note Leita Fitch 1941-03-11  Procedure: Injection Indications: Right trigger points, trapezius pain and rhomboid pain  Procedure Details Consent: Risks of procedure as well as the alternatives and risks of each were explained to the (patient/caregiver).  Consent for procedure obtained. Time Out: Verified patient identification, verified procedure, site/side was marked, verified correct patient  position, special equipment/implants available, medications/allergies/relevent history reviewed, required imaging and test results available.  Performed.  The area was cleaned with iodine and alcohol swabs.    The right trigger points of the trapezius and rhomboid was injected using 1 cc's of 40 mg Depo-Medrol and 4 cc's of 0.25% bupivacaine with a 25 1" needle.  Ultrasound was used. .     A sterile dressing was applied.  Patient did tolerate procedure well.      ASSESSMENT & PLAN:   Myofascial pain Pain is periscapular in nature of trigger points appreciated on exam. -Trigger point injections of the trapezius and rhomboid. -Counseled on home exercise therapy and supportive care.   Cervical radiculopathy Still having ulnar-sided radicular symptoms. -If symptoms are ongoing could consider gabapentin. -Could consider cervical collar.

## 2019-09-18 NOTE — Assessment & Plan Note (Signed)
Still having ulnar-sided radicular symptoms. -If symptoms are ongoing could consider gabapentin. -Could consider cervical collar.

## 2019-09-19 ENCOUNTER — Ambulatory Visit: Payer: Medicare Other | Admitting: Family Medicine

## 2019-09-19 DIAGNOSIS — L814 Other melanin hyperpigmentation: Secondary | ICD-10-CM | POA: Diagnosis not present

## 2019-09-19 DIAGNOSIS — L821 Other seborrheic keratosis: Secondary | ICD-10-CM | POA: Diagnosis not present

## 2019-09-19 DIAGNOSIS — L578 Other skin changes due to chronic exposure to nonionizing radiation: Secondary | ICD-10-CM | POA: Diagnosis not present

## 2019-09-19 DIAGNOSIS — L720 Epidermal cyst: Secondary | ICD-10-CM | POA: Diagnosis not present

## 2019-09-20 ENCOUNTER — Other Ambulatory Visit: Payer: Self-pay

## 2019-09-20 ENCOUNTER — Encounter: Payer: Self-pay | Admitting: Physical Therapy

## 2019-09-20 ENCOUNTER — Ambulatory Visit: Payer: Medicare Other | Attending: Family Medicine | Admitting: Physical Therapy

## 2019-09-20 DIAGNOSIS — R293 Abnormal posture: Secondary | ICD-10-CM

## 2019-09-20 DIAGNOSIS — M62838 Other muscle spasm: Secondary | ICD-10-CM | POA: Insufficient documentation

## 2019-09-20 DIAGNOSIS — M5413 Radiculopathy, cervicothoracic region: Secondary | ICD-10-CM | POA: Insufficient documentation

## 2019-09-20 DIAGNOSIS — M25511 Pain in right shoulder: Secondary | ICD-10-CM | POA: Insufficient documentation

## 2019-09-20 DIAGNOSIS — M6281 Muscle weakness (generalized): Secondary | ICD-10-CM | POA: Insufficient documentation

## 2019-09-20 NOTE — Therapy (Signed)
[facility] [place] [facility] [address]  [place], [place], [address] Phone: [phone]   Fax:  [phone]  Physical Therapy Evaluation  Patient Details  Name: [name] MRN: [id number] Date of Birth: 1940-05-09 Referring Provider (PT): Clearance Coots, MD   Encounter Date: 09/20/2019  PT End of Session - 09/20/19 0809    Visit Number  1    Number of Visits  12    Date for PT Re-Evaluation  11/01/19    Authorization Type  Medicare & [employer]    PT Start Time  0809   Pt arrived late   PT Stop Time  0900    PT Time Calculation (min)  51 min    Activity Tolerance  Patient tolerated treatment well    Behavior During Therapy  [facility] for tasks assessed/performed       Past Medical History:  Diagnosis Date  . Allergy   . Anxiety    takes ativan as needed  . Benign positional vertigo   . Bursitis of shoulder, left   . Cancer [facility])    Endometrial ca/Recurrence  . CKD (chronic kidney disease), stage III   . History of colonic polyps   . Hypercholesteremia   . Hypercholesterolemia   . Hypertension   . Lumbar pain   . Osteoarthritis   . Osteoporosis   . S/P radiation therapy July 29, 2010-Sep 06, 2010   External beam of pelvis  . S/P radiation therapy 09/15/10, 09/24/10, 09/28/2010   Intracavitary brachytherapy  . SBO (small bowel obstruction) ([place])   . Status post chemotherapy    carboplatin/paclitaxel x 6 rounds    Past Surgical History:  Procedure Laterality Date  . ABDOMINAL HYSTERECTOMY    . BREAST LUMPECTOMY  1993   Left breast, benign  . ROBOTIC ASSISTED LAPAROSCOPIC VAGINAL HYSTERECTOMY WITH FIBROID REMOVAL  July 2010   bilat. salpingo-oophorectomy    There were no vitals filed for this visit.   Subjective Assessment - 09/20/19 0812    Subjective  Pt reports she has been having really bad R shoulder pain. Went to ER and imaging was negative. Saw Dr. Raeford Razor and received TP injections to UT & rhomboids  on Wed 5/26 with some relief but still experiences some radicular pain in R UE in ulnar nerve distribution. She states she has ordered a Theracane as recommended by MD and that MD indicated that he may start gabapentin if nerve pain persists.    Pertinent History  HTN, endometrial cancer, anxiety, BPPV, L shoulder bursitis, CKD, OA, osteoporosis    Diagnostic tests  R shoulder x-ray 09/10/19 - Unremarkable    Patient Stated Goals  "No pain"    Currently in Pain?  Yes    Pain Score  3    3-4/10; up to 10/10 prior to injection   Pain Location  Shoulder    Pain Orientation  Right    Pain Descriptors / Indicators  Dull;Aching    Pain Type  Chronic pain    Pain Radiating Towards  achy pain down R arm with intermitent numbness and tingling into R hand    Pain Onset  --   "months and months"   Pain Frequency  Intermittent    Aggravating Factors   certain positions    Pain Relieving Factors  repositioning, heat, extra strength Tylenol    Effect of Pain on Daily Activities  no specific problems noted         [facility] PT  Assessment - 09/20/19 0809      Assessment   Medical Diagnosis  Cervical radiculopathy, R shoulder pain    Referring Provider (PT)  Clearance Coots, MD    Onset Date/Surgical Date  --   "at least a couple months"   Hand Dominance  Right    Next MD Visit  10/17/19    Prior Therapy  none for current condition; h/o PT for L? frozen shoulder & LBP      Precautions   Precautions  None      Balance Screen   Has the patient fallen in the past 6 months  No    Has the patient had a decrease in activity level because of a fear of falling?   No    Is the patient reluctant to leave their home because of a fear of falling?   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    Leisure  gardening      Cognition   Overall Cognitive Status  Within [place] for tasks assessed      Posture/Postural Control   Posture/Postural Control  Postural limitations    Postural Limitations  Forward head;Rounded Shoulders;Increased thoracic kyphosis    Posture Comments  concave chest; R shoduler elevated & protracted      ROM / Strength   AROM / PROM / Strength  AROM;Strength      AROM   Overall AROM   Within functional limits for tasks performed   B shoulder ROM   AROM Assessment Site  Cervical;Shoulder    Cervical Flexion  64 - pulling in R shoulder    Cervical Extension  37 - pain in lower neck    Cervical - Right Side Bend  20    Cervical - Left Side Bend  19    Cervical - Right Rotation  46 - pain    Cervical - Left Rotation  54 - pain      Strength   Strength Assessment Site  Shoulder    Right/Left Shoulder  Right;Left    Right Shoulder Flexion  4-/5    Right Shoulder ABduction  4-/5    Right Shoulder Internal Rotation  4-/5    Right Shoulder External Rotation  4-/5    Left Shoulder Flexion  4/5    Left Shoulder ABduction  4/5    Left Shoulder Internal Rotation  4/5    Left Shoulder External Rotation  4/5      Palpation   Palpation comment  increased muscle tension and ttp in B pecs, UT, LS, rhomboids and cervical paraspinals (R>L)                  Objective measurements completed on examination: See above findings.      [facility] Adult PT Treatment/Exercise - 09/20/19 0809      Self-Care   Self-Care  Posture;Other Self-Care Comments    Posture  Basic posture education for neutral  spine and shoulder alignment, esp when working on laptop.     Other Self-Care Comments   Instructions in use of Theracane for self-STM to upper  shoulder musculature.      Exercises   Exercises  Neck      Neck Exercises: Seated   Neck Retraction  10 reps;5 secs    Other Seated Exercise  Scap retraction & depression 10 x 5"      Neck Exercises: Stretches   Upper Trapezius Stretch  Right;30 seconds;2 reps    Corner Stretch  30 seconds;3 reps    Corner Stretch Limitations  3-way doorway stretch             PT Education - 09/20/19 0900    Education Details  PT eval findings, anticipated POC, initial HEP and instruction in use of Theracane    Person(s) Educated  Patient    Methods  Explanation;Demonstration;Handout;Verbal cues;Tactile cues    Comprehension  Verbalized understanding;Returned demonstration;Verbal cues required;Tactile cues required;Need further instruction       PT Short Term Goals - 09/20/19 0900      PT SHORT TERM GOAL #1   Title  Patient will be independent with initial HEP    Status  New    Target Date  10/04/19      PT SHORT TERM GOAL #2   Title  Patient will verbalize/demonstrate understanding of neutral spine and shoulder posture and proper body mechanics to reduce strain on cervical spine    Status  New    Target Date  10/11/19        PT Long Term Goals - 09/20/19 0900      PT LONG TERM GOAL #1   Title  Patient will be independent with ongoing/advanced HEP    Status  New    Target Date  11/01/19      PT LONG TERM GOAL #2   Title  Patient to demonstrate ability to achieve and maintain good spinal and shoulder alignment/posturing    Status  New    Target Date  11/01/19      PT LONG TERM GOAL #3   Title  Patient to report pain reduction in frequency and intensity by >/= 50%    Status  New    Target Date  11/01/19      PT LONG TERM GOAL #4   Title  Patient to improve cervical AROM to [facility] without pain provocation    Status  New    Target Date  11/01/19      PT LONG TERM GOAL #5   Title  Patient will demonstrate improved B shoulder strength to >/= 4+/5 for functional UE use     Status  New    Target Date  11/01/19      PT LONG TERM GOAL #6   Title  Patient to report ability to perform work and daily activities without pain provocation    Status  New    Target Date  11/01/19             Plan - 09/20/19 0900    Clinical Impression Statement  Neilah is a 79 y/o female who presents to OP PT with acute/chronic R shoulder and periscapular myofascial pain and cervical radiculopathy in ulnar nerve distribution. Pt unable to identify known MOI or triggering event and is uncertain of duration of pain but states pain has been worsening over at least the past few months. R shoulder x-ray unremarkable. Deficits include R shoulder, periscapular and  R UE pain, severe forward head and rounded shoulder posture with increased thoracic kyphosis and concave chest along with R shoulder elevation and scapular protraction, decreased cervical ROM and mild decreased R UE strength. Maddisen will benefit from skilled PT to address above deficits and promote neutral spinal and shoulder posture to restore functional cervical ROM and decrease R shoulder/periscapular pain and UE radiculopathy to decrease interference with daily household, work and recreational activities.    Personal Factors and Comorbidities  Age;Comorbidity 3+;Fitness;Past/Current Experience;Profession;Time since onset of injury/illness/exacerbation    Comorbidities  HTN, endometrial cancer, anxiety, BPPV, L shoulder bursitis, CKD, OA, osteoporosis    Examination-Activity Limitations  Sit;Reach Overhead;Lift;Carry    Examination-Participation Restrictions  Cleaning;Community Activity;Driving;Laundry;Meal Prep    Stability/Clinical Decision Making  Evolving/Moderate complexity    Clinical Decision Making  Moderate    Rehab Potential  Good    PT Frequency  2x / week    PT Duration  6 weeks    PT Treatment/Interventions  ADLs/Self Care Home Management;Cryotherapy;Electrical Stimulation;Iontophoresis 4mg /ml Dexamethasone;Moist  Heat;Ultrasound;Functional mobility training;Therapeutic activities;Therapeutic exercise;Neuromuscular re-education;Patient/family education;Manual techniques;Passive range of motion;Dry needling;Taping;Spinal Manipulations;Joint Manipulations    PT Next Visit Plan  Review initial HEP; posture and body mechanics education; postural flexibilty and strengthening; cervical ROM; manual therapy to address pain, increased muscle tension and cervical ROM; modalities PRN    PT Home Exercise Plan  5/28 - cervical retraction, UT and 3-way doorway pec stretches, scap retraction & depression    Consulted and Agree with Plan of Care  Patient       Patient will benefit from skilled therapeutic intervention in order to improve the following deficits and impairments:  Decreased activity tolerance, Decreased knowledge of precautions, Decreased range of motion, Decreased strength, Hypomobility, Increased fascial restricitons, Increased muscle spasms, Impaired perceived functional ability, Impaired flexibility, Impaired UE functional use, Improper body mechanics, Postural dysfunction, Pain  Visit Diagnosis: Radiculopathy, cervicothoracic region  Abnormal posture  Other muscle spasm  Acute pain of right shoulder  Muscle weakness (generalized)     Problem List Patient Active Problem List   Diagnosis Date Noted  . Myofascial pain 09/18/2019  . Cervical radiculopathy 09/12/2019  . Essential hypertension, benign 05/18/2013  . SBO (small bowel obstruction) ([place]) 05/17/2013  . Small bowel obstruction ([place]) 05/16/2013  . Malignant neoplasm of corpus uteri, except isthmus ([place]) 05/16/2011    Percival Spanish, PT, MPT 09/20/2019, 1:22 PM  [facility] [address]  [address] [place], [place], [address] Phone: [phone]   Fax:  [phone]  Name: Makaylyn Griepentrog MRN: [id number] Date of Birth: [birth year]

## 2019-09-20 NOTE — Patient Instructions (Signed)
    Home exercise program created by Mata Rowen, PT.  For questions, please contact Tiawanna Luchsinger via phone at [phone] or email at [email or web address]  [facility] [address]  [address] [place], [place], [address] Phone: [phone]   Fax:  [phone]    

## 2019-09-24 NOTE — Assessment & Plan Note (Addendum)
Stage IIIA grade 1endometrial cancer treated on GOG protocol 258.  Negative symptom review/exam.  NED. Likely vulva dermatitis in the setting of urinary incontinence vs dystrophy, post RT changes  Optimize hygiene practices Recommend yearly follow-up

## 2019-09-24 NOTE — Progress Notes (Signed)
Follow Up Note: Gyn-Onc  [name] 79 y.o. female  CC: She presents for a f/u visit.  HPI:  Oncology History  Malignant neoplasm of corpus uteri, except isthmus ([place])  10/2008 Surgery    robotic-assisted laparoscopic hysterectomy, bilateral salpingo- oophorectomy, lymph node dissection   2010 -  Chemotherapy    She was enrolled in GOG protocol 258 and was randomized to receive 6 cycles of Taxol and carboplatin    2011 Relapse/Recurrence    Recurrent disease was identified at a laparoscopic port site, pelvic lymph nodes and in the vagina   05/16/2011 Initial Diagnosis   Malignant neoplasm of corpus uteri, except isthmus [name]    - 2011 Radiation Therapy   adjuvant external beam and vaginal brachytherapy     - 09/2010 Chemotherapy    Taxol carboplatin therapy       Interval History: In 11/19 a vaginal lesion was biopsied.  The histology showed a hemangioma.  She denies vaginal bleeding, abdominal/pelvic pain, cough, lethargy or abdominal distension.  Review of Systems  Review of Systems  Constitutional: Positive for weight loss. Negative for malaise/fatigue.  Respiratory: Negative for cough.   Gastrointestinal: Negative for abdominal pain.  Genitourinary:       No bleeding   Current Meds:  Outpatient Encounter Medications as of 09/25/2019  Medication Sig  . aspirin 325 MG tablet Take 325 mg by mouth daily.  Marland Kitchen atorvastatin (LIPITOR) 40 MG tablet TK 1 T PO QHS  . benazepril (LOTENSIN) 40 MG tablet Take 40 mg by mouth daily.  . calcium carbonate (OS-CAL) 600 MG TABS tablet Take 600 mg by mouth daily with breakfast.  . cetirizine (ZYRTEC) 10 MG tablet Take 10 mg by mouth daily as needed.   . cholecalciferol (VITAMIN D) 1000 UNITS tablet Take 5,000 Units by mouth daily.   . cyclobenzaprine (FLEXERIL) 5 MG tablet Take 5 mg by mouth.  . denosumab (PROLIA) 60 MG/ML SOLN injection Inject 60 mg  into the skin every 6 (six) months. Administer in upper arm, thigh, or abdomen  . diclofenac sodium (VOLTAREN) 1 % GEL Apply 2 g topically daily as needed (for pain).  . ferrous sulfate 325 (65 FE) MG tablet Take 325 mg by mouth daily.   . fexofenadine (ALLEGRA) 180 MG tablet Take 180 mg by mouth daily as needed.   . Folic Acid-Vit Q000111Q 123456 (FOLBEE) 2.5-25-1 MG TABS Take 1 tablet by mouth daily.  Marland Kitchen ibuprofen (ADVIL,MOTRIN) 200 MG tablet Take 200 mg by mouth every 6 (six) hours as needed.  . loratadine (CLARITIN) 10 MG tablet Take 10 mg by mouth daily. Reported on 11/12/2015  . LORazepam (ATIVAN) 0.5 MG tablet Take 0.25 mg by mouth as needed for anxiety.  . meclizine (ANTIVERT) 25 MG tablet Take 25 mg by mouth 4 (four) times daily as needed for dizziness or nausea.  . Multiple Vitamin (MULTIVITAMIN) tablet Take 1 tablet by mouth daily. Centrum silver  . Omega-3 Fatty Acids (FISH OIL) 1200 MG CAPS Take 1,200 mg by mouth daily.  Vladimir Faster Glycol-Propyl Glycol (SYSTANE) 0.4-0.3 % SOLN Place 1 drop into both eyes daily as needed (for dry eyes).  . polyethylene glycol (MIRALAX / GLYCOLAX) packet Take 17 g by mouth daily.  . promethazine (PHENERGAN) 25 MG tablet Take 25 mg by mouth every 4 (four) hours as needed for nausea or vomiting.   No facility-administered encounter medications on file as of 09/25/2019.    Allergy:  Allergies  Allergen Reactions  . Phenobarbital  Feeling of uneasiness    Social Hx:   Social History   Socioeconomic History  . Marital status: Widowed    Spouse name: Not on file  . Number of children: Not on file  . Years of education: Not on file  . Highest education level: Not on file  Occupational History  . Not on file  Tobacco Use  . Smoking status: Former Smoker    Packs/day: 1.00    Types: Cigarettes    Quit date: 05/12/1976    Years since quitting: 43.4  . Smokeless tobacco: Never Used  Substance and Sexual Activity  . Alcohol use: No  . Drug use:  No  . Sexual activity: Never  Other Topics Concern  . Not on file  Social History Narrative  . Not on file   Social Determinants of Health   Financial Resource Strain:   . Difficulty of Paying Living Expenses:   Food Insecurity:   . Worried About Charity fundraiser in the Last Year:   . Arboriculturist in the Last Year:   Transportation Needs:   . Film/video editor (Medical):   Marland Kitchen Lack of Transportation (Non-Medical):   Physical Activity:   . Days of Exercise per Week:   . Minutes of Exercise per Session:   Stress:   . Feeling of Stress :   Social Connections:   . Frequency of Communication with Friends and Family:   . Frequency of Social Gatherings with Friends and Family:   . Attends Religious Services:   . Active Member of Clubs or Organizations:   . Attends Archivist Meetings:   Marland Kitchen Marital Status:   Intimate Partner Violence:   . Fear of Current or Ex-Partner:   . Emotionally Abused:   Marland Kitchen Physically Abused:   . Sexually Abused:     Past Surgical Hx:  Past Surgical History:  Procedure Laterality Date  . ABDOMINAL HYSTERECTOMY    . BREAST LUMPECTOMY  1993   Left breast, benign  . ROBOTIC ASSISTED LAPAROSCOPIC VAGINAL HYSTERECTOMY WITH FIBROID REMOVAL  July 2010   bilat. salpingo-oophorectomy    Past Medical Hx:  Past Medical History:  Diagnosis Date  . Allergy   . Anxiety    takes ativan as needed  . Benign positional vertigo   . Bursitis of shoulder, left   . Cancer [facility])    Endometrial ca/Recurrence  . CKD (chronic kidney disease), stage III   . History of colonic polyps   . Hypercholesteremia   . Hypercholesterolemia   . Hypertension   . Lumbar pain   . Osteoarthritis   . Osteoporosis   . S/P radiation therapy July 29, 2010-Sep 06, 2010   External beam of pelvis  . S/P radiation therapy 09/15/10, 09/24/10, 09/28/2010   Intracavitary brachytherapy  . SBO (small bowel obstruction) ([place])   . Status post chemotherapy     carboplatin/paclitaxel x 6 rounds    Family Hx:  Family History  Problem Relation Age of Onset  . Breast cancer Mother   . Heart disease Father   . Breast cancer Sister   . Diabetes Sister   . Colon cancer Neg Hx   . Esophageal cancer Neg Hx   . Rectal cancer Neg Hx   . Stomach cancer Neg Hx     Vitals:  BP (!) 153/84 (BP Location: Left Arm, Patient Position: Sitting)   Pulse 100   Temp 98.1 F (36.7 C) (Oral)   Resp 16  Ht 5\' 5"  (1.651 m)   Wt 151 lb 8 oz (68.7 kg)   SpO2 100%   BMI 25.21 kg/m  Physical Exam:  Physical Exam  Constitutional: She is oriented to person, place, and time and well-developed, well-nourished, and in no distress.  Abdominal: Soft. She exhibits no distension. There is no abdominal tenderness.  Genitourinary:    Rectum normal.  Right adnexum displays no mass. Left adnexum displays no mass.    Rash: white, waxy skin in keyhole configuration, loss of architecture.     Genitourinary Comments: Vagina shortened w/synechiae at the apex   Lymphadenopathy:       Right: No inguinal and no supraclavicular adenopathy present.       Left: No inguinal and no supraclavicular adenopathy present.  Neurological: She is alert and oriented to person, place, and time.  Skin: Skin is warm and dry.    Assessment/Plan: Malignant neoplasm of corpus uteri, except isthmus Stage IIIA grade 1endometrial cancer treated on GOG protocol 258.  Negative symptom review/exam.  NED. Likely vulva dermatitis in the setting of urinary incontinence vs dystrophy, post RT changes  Optimize hygiene practices Recommend yearly follow-up  I personally spent 25 minutes face-to-face and non-face-to-face in the care of this patient, which includes all pre, intra, and post visit time on the date of service.   Lahoma Crocker, MD 09/25/2019, 9:47 AM

## 2019-09-25 ENCOUNTER — Ambulatory Visit: Payer: Medicare Other | Admitting: Obstetrics & Gynecology

## 2019-09-25 ENCOUNTER — Other Ambulatory Visit: Payer: Self-pay

## 2019-09-25 ENCOUNTER — Encounter: Payer: Self-pay | Admitting: Obstetrics & Gynecology

## 2019-09-25 ENCOUNTER — Inpatient Hospital Stay: Payer: Medicare Other | Attending: Obstetrics & Gynecology | Admitting: Obstetrics & Gynecology

## 2019-09-25 DIAGNOSIS — Z90722 Acquired absence of ovaries, bilateral: Secondary | ICD-10-CM | POA: Insufficient documentation

## 2019-09-25 DIAGNOSIS — Z90710 Acquired absence of both cervix and uterus: Secondary | ICD-10-CM | POA: Diagnosis not present

## 2019-09-25 DIAGNOSIS — Z9221 Personal history of antineoplastic chemotherapy: Secondary | ICD-10-CM | POA: Insufficient documentation

## 2019-09-25 DIAGNOSIS — C549 Malignant neoplasm of corpus uteri, unspecified: Secondary | ICD-10-CM | POA: Insufficient documentation

## 2019-09-25 DIAGNOSIS — Z923 Personal history of irradiation: Secondary | ICD-10-CM | POA: Insufficient documentation

## 2019-09-25 NOTE — Patient Instructions (Addendum)
Follow-up in 1 year.  Vulvar/Vaginal Moisturizers  Moisturizer Options:  Vitamin E oil: pump or capsule form  Vitamin E cream (Genes vitamin E cream)  Coconut oil: bottle or bead form  Shea butter  Blossom Organic Lubricant (organic and all natural; www.blossomorganics.com)  PE suppository(coconut oil/vitamin E/palm oil)  Desert Harvest Aloe Glide      Consider the ingredients of the product - the fewer the ingredients the better!  Directions for Use: 1. Clean and dry your hands 2. Gently dab the vulvar/vaginal area dry as needed 3. Apply a pea-sized amount of the moisturizer onto your fingertip 4. Using you other hand, open the labia   5. Apply the moisturizer to the vulvar/vaginal tissues 6. Wear loose fitting underwear/clothing if possible following application  Use moisturize 2-3 times daily as desired.  Can apply hydrocortisone 1%. Topical corticosteroids can be used one or more times daily, for two to four weeks  Healthy vulval hygiene practices Avoid Substitute  Clothing  Pantyhose Stockings with a garter belt Thigh-high or knee-high stockings   Synthetic underwear Cotton underwear or no underwear  Jeans and other tight pants Loose pants, skirts, dresses  Swimsuits, leotards, thongs, lycra garments Loose-fitting cotton garments  Cleansing products  Scented soaps or shampoos Fragrance-free pH neutral soap  Bubble bath Tub baths in the morning and at night without additives and at a comfortable temperature  Scented detergents Unscented detergents  Baby wipes or flushable wipes Rinse with water using sports water bottle or perineal irrigation bottle  Feminine sprays, douches, powders These are not necessary products and can be omitted from personal practices  Other  Washcloths Use fingertips for washing; pat dry, do not rub dry  Panty liners Tampons or cotton pads  Dyed toilet articles Toilet articles without dyes  Hair dryers to dry vulva skin without contact  Dry vulva by gentle patting

## 2019-09-29 ENCOUNTER — Encounter: Payer: Self-pay | Admitting: Family Medicine

## 2019-09-30 ENCOUNTER — Ambulatory Visit (INDEPENDENT_AMBULATORY_CARE_PROVIDER_SITE_OTHER): Payer: Medicare Other | Admitting: Family Medicine

## 2019-09-30 ENCOUNTER — Other Ambulatory Visit: Payer: Self-pay

## 2019-09-30 ENCOUNTER — Ambulatory Visit: Payer: Self-pay

## 2019-09-30 ENCOUNTER — Ambulatory Visit: Payer: Medicare Other | Admitting: Physical Therapy

## 2019-09-30 ENCOUNTER — Encounter: Payer: Self-pay | Admitting: Family Medicine

## 2019-09-30 ENCOUNTER — Ambulatory Visit (HOSPITAL_BASED_OUTPATIENT_CLINIC_OR_DEPARTMENT_OTHER)
Admission: RE | Admit: 2019-09-30 | Discharge: 2019-09-30 | Disposition: A | Discharge status: Home / Self Care | Payer: Medicare Other | Source: Ambulatory Visit | Attending: Family Medicine | Admitting: Family Medicine

## 2019-09-30 VITALS — Ht 65.0 in | Wt 150.0 lb

## 2019-09-30 DIAGNOSIS — M5412 Radiculopathy, cervical region: Secondary | ICD-10-CM | POA: Diagnosis not present

## 2019-09-30 DIAGNOSIS — M47812 Spondylosis without myelopathy or radiculopathy, cervical region: Secondary | ICD-10-CM | POA: Diagnosis not present

## 2019-09-30 DIAGNOSIS — M25511 Pain in right shoulder: Secondary | ICD-10-CM

## 2019-09-30 DIAGNOSIS — M5030 Other cervical disc degeneration, unspecified cervical region: Secondary | ICD-10-CM | POA: Diagnosis not present

## 2019-09-30 MED ORDER — METHYLPREDNISOLONE ACETATE 40 MG/ML IJ SUSP
40.0000 mg | Freq: Once | INTRAMUSCULAR | Status: AC
  Administered 2019-09-30: 40 mg via INTRA_ARTICULAR

## 2019-09-30 MED ORDER — GABAPENTIN 100 MG PO CAPS: 100.0000 mg | ORAL_CAPSULE | Freq: Three times a day (TID) | ORAL | 1 refills | Status: DC | PRN

## 2019-09-30 NOTE — Assessment & Plan Note (Signed)
Having some shoulder pain associated with her right arm pain.  Possible for impingement with dynamic testing revealing an elevated humeral head -Injection. -Could consider physical therapy.

## 2019-09-30 NOTE — Assessment & Plan Note (Signed)
Concerned that most of her pain is radicular in nature.   -Counseled on home exercise therapy and supportive care. -X-ray. -Gabapentin. -Counseled on soft collar. -May need to consider MRI if pain is ongoing for consideration of epidural.

## 2019-09-30 NOTE — Progress Notes (Signed)
[name] - 79 y.o. female MRN [id number]  Date of birth: [birth year]  SUBJECTIVE:  Including CC & ROS.  Chief Complaint  Patient presents with  . Follow-up    right shoulder    [name] is a 79 y.o. female that is presenting with worsening right shoulder and arm pain.  We have tried trigger point injections with limited improvement.  She denies significant improvement the arm pain seems to be from the shoulder extending down to the hand.  It is severe and becoming more constant.  She does take Tylenol which helps some.   Review of Systems See HPI   HISTORY: Past Medical, Surgical, Social, and Family History Reviewed & Updated per EMR.   Pertinent Historical Findings include:  Past Medical History:  Diagnosis Date  . Allergy   . Anxiety    takes ativan as needed  . Benign positional vertigo   . Bursitis of shoulder, left   . Cancer [facility])    Endometrial ca/Recurrence  . CKD (chronic kidney disease), stage III   . History of colonic polyps   . Hypercholesteremia   . Hypercholesterolemia   . Hypertension   . Lumbar pain   . Osteoarthritis   . Osteoporosis   . S/P radiation therapy July 29, 2010-Sep 06, 2010   External beam of pelvis  . S/P radiation therapy 09/15/10, 09/24/10, 09/28/2010   Intracavitary brachytherapy  . SBO (small bowel obstruction) ([place])   . Status post chemotherapy    carboplatin/paclitaxel x 6 rounds    Past Surgical History:  Procedure Laterality Date  . ABDOMINAL HYSTERECTOMY    . BREAST LUMPECTOMY  1993   Left breast, benign  . ROBOTIC ASSISTED LAPAROSCOPIC VAGINAL HYSTERECTOMY WITH FIBROID REMOVAL  July 2010   bilat. salpingo-oophorectomy    Family History  Problem Relation Age of Onset  . Breast cancer Mother   . Heart disease Father   . Breast cancer Sister   . Diabetes Sister   . Colon cancer Neg Hx   . Esophageal cancer Neg Hx   . Rectal cancer Neg Hx   . Stomach cancer Neg Hx     Social History   Socioeconomic History    . Marital status: Widowed    Spouse name: Not on file  . Number of children: Not on file  . Years of education: Not on file  . Highest education level: Not on file  Occupational History  . Not on file  Tobacco Use  . Smoking status: Former Smoker    Packs/day: 1.00    Types: Cigarettes    Quit date: 05/12/1976    Years since quitting: 43.4  . Smokeless tobacco: Never Used  Substance and Sexual Activity  . Alcohol use: No  . Drug use: No  . Sexual activity: Never  Other Topics Concern  . Not on file  Social History Narrative  . Not on file   Social Determinants of Health   Financial Resource Strain:   . Difficulty of Paying Living Expenses:   Food Insecurity:   . Worried About Charity fundraiser in the Last Year:   . Arboriculturist in the Last Year:   Transportation Needs:   . Film/video editor (Medical):   Marland Kitchen Lack of Transportation (Non-Medical):   Physical Activity:   . Days of Exercise per Week:   . Minutes of Exercise per Session:   Stress:   . Feeling of Stress :   Social Connections:   .  Frequency of Communication with Friends and Family:   . Frequency of Social Gatherings with Friends and Family:   . Attends Religious Services:   . Active Member of Clubs or Organizations:   . Attends Archivist Meetings:   Marland Kitchen Marital Status:   Intimate Partner Violence:   . Fear of Current or Ex-Partner:   . Emotionally Abused:   Marland Kitchen Physically Abused:   . Sexually Abused:      PHYSICAL EXAM:  VS: Ht 5\' 5"  (1.651 m)   Wt 150 lb (68 kg)   BMI 24.96 kg/m  Physical Exam Gen: NAD, alert, cooperative with exam, well-appearing MSK:  Right shoulder: Normal range of motion. Normal strength resistance. Positive empty can test. Neurovascularly intact   Aspiration/Injection Procedure Note [name] Dec 15, 1940  Procedure: Injection Indications: Right shoulder pain  Procedure Details Consent: Risks of procedure as well as the alternatives and  risks of each were explained to the (patient/caregiver).  Consent for procedure obtained. Time Out: Verified patient identification, verified procedure, site/side was marked, verified correct patient position, special equipment/implants available, medications/allergies/relevent history reviewed, required imaging and test results available.  Performed.  The area was cleaned with iodine and alcohol swabs.    The right subacromial space was injected using 1 cc's of 40 mg Depo-Medrol and 4 cc's of 0.25% bupivacaine with a 22 1 1/2" needle.  Ultrasound was used. Images were obtained in long views showing the injection.     A sterile dressing was applied.  Patient did tolerate procedure well.      ASSESSMENT & PLAN:   Cervical radiculopathy Concerned that most of her pain is radicular in nature.   -Counseled on home exercise therapy and supportive care. -X-ray. -Gabapentin. -Counseled on soft collar. -May need to consider MRI if pain is ongoing for consideration of epidural.   Acute pain of right shoulder Having some shoulder pain associated with her right arm pain.  Possible for impingement with dynamic testing revealing an elevated humeral head -Injection. -Could consider physical therapy.

## 2019-09-30 NOTE — Patient Instructions (Addendum)
Good to see you Please continue heat  Please try the gabapentin. This can make you sleepy so start it at night. Please start with one pill at night. You can increase to 2 or 3 times daily as you tolerate.  I will call with the results from today  Please send me a message in MyChart with any questions or updates.  Please see me back in 4 weeks.   --Dr. Raeford Razor

## 2019-10-01 ENCOUNTER — Telehealth: Payer: Self-pay | Admitting: Family Medicine

## 2019-10-01 NOTE — Telephone Encounter (Signed)
Informed of results.   Rosemarie Ax, MD [facility] 10/01/2019, 8:59 AM

## 2019-10-02 ENCOUNTER — Other Ambulatory Visit: Payer: Self-pay

## 2019-10-02 ENCOUNTER — Ambulatory Visit: Payer: Medicare Other | Attending: Family Medicine

## 2019-10-02 DIAGNOSIS — M62838 Other muscle spasm: Secondary | ICD-10-CM | POA: Diagnosis present

## 2019-10-02 DIAGNOSIS — M5413 Radiculopathy, cervicothoracic region: Secondary | ICD-10-CM

## 2019-10-02 DIAGNOSIS — M25511 Pain in right shoulder: Secondary | ICD-10-CM | POA: Insufficient documentation

## 2019-10-02 DIAGNOSIS — R293 Abnormal posture: Secondary | ICD-10-CM | POA: Diagnosis present

## 2019-10-02 DIAGNOSIS — M6281 Muscle weakness (generalized): Secondary | ICD-10-CM | POA: Diagnosis present

## 2019-10-02 NOTE — Therapy (Signed)
[facility] [place] [address]  [place], [place], [address] Phone: [phone]   Fax:  [phone]  Physical Therapy Treatment  Patient Details  Name: [name] MRN: [id number] Date of Birth: [birth year] Referring Provider (PT): Clearance Coots, MD   Encounter Date: 10/02/2019  PT End of Session - 10/02/19 0947    Visit Number  2    Number of Visits  12    Date for PT Re-Evaluation  11/01/19    Authorization Type  Medicare & [employer]    PT Start Time  [phone]    PT Stop Time  1018    PT Time Calculation (min)  42 min    Activity Tolerance  Patient tolerated treatment well    Behavior During Therapy  [facility] for tasks assessed/performed       Past Medical History:  Diagnosis Date  . Allergy   . Anxiety    takes ativan as needed  . Benign positional vertigo   . Bursitis of shoulder, left   . Cancer [facility])    Endometrial ca/Recurrence  . CKD (chronic kidney disease), stage III   . History of colonic polyps   . Hypercholesteremia   . Hypercholesterolemia   . Hypertension   . Lumbar pain   . Osteoarthritis   . Osteoporosis   . S/P radiation therapy July 29, 2010-Sep 06, 2010   External beam of pelvis  . S/P radiation therapy 09/15/10, 09/24/10, 09/28/2010   Intracavitary brachytherapy  . SBO (small bowel obstruction) ([place])   . Status post chemotherapy    carboplatin/paclitaxel x 6 rounds    Past Surgical History:  Procedure Laterality Date  . ABDOMINAL HYSTERECTOMY    . BREAST LUMPECTOMY  1993   Left breast, benign  . ROBOTIC ASSISTED LAPAROSCOPIC VAGINAL HYSTERECTOMY WITH FIBROID REMOVAL  July 2010   bilat. salpingo-oophorectomy    There were no vitals filed for this visit.  Subjective Assessment - 10/02/19 0940    Subjective  Saw MD on Monday and got an injection.    Pertinent History  HTN, endometrial cancer, anxiety, BPPV, L shoulder bursitis, CKD, OA, osteoporosis    Diagnostic tests  R shoulder  x-ray 09/10/19 - Unremarkable    Patient Stated Goals  "No pain"    Currently in Pain?  Yes    Pain Score  3     Pain Location  Shoulder    Pain Orientation  Right;Posterior    Pain Descriptors / Indicators  Dull;Aching    Pain Type  Chronic pain    Pain Frequency  Intermittent                        [facility] Adult PT Treatment/Exercise - 10/02/19 0001      Self-Care   Self-Care  Posture    Posture  Further discussed desk posture and desk setup at home to reduce shoulder and cervical strain; pt. currently sitting at kitchen table with laptop not elevated and in kitchen chair; discussed purchase of better lumbar/back supportive chair with addition of elevating laptop and using external keyboard for improved positioning       Neck Exercises: Machines for Strengthening   UBE (Upper Arm Bike)  attempted however terminated due to R shoulder pain       Neck Exercises: Seated   Neck Retraction  10 reps;5 secs    Neck Retraction Limitations  heavy cueing for understanding and increased ROM  Other Seated Exercise  Scap retraction & depression 10 x 5"      Shoulder Exercises: Pulleys   Flexion  3 minutes    Scaption  3 minutes      Neck Exercises: Stretches   Upper Trapezius Stretch  Right;30 seconds;2 reps    Corner Stretch  30 seconds;3 reps    Corner Stretch Limitations  3-way doorway stretch               PT Short Term Goals - 10/02/19 0947      PT SHORT TERM GOAL #1   Title  Patient will be independent with initial HEP    Status  Achieved    Target Date  10/04/19      PT SHORT TERM GOAL #2   Title  Patient will verbalize/demonstrate understanding of neutral spine and shoulder posture and proper body mechanics to reduce strain on cervical spine    Status  On-going    Target Date  10/11/19        PT Long Term Goals - 10/02/19 1009      PT LONG TERM GOAL #1   Title  Patient will be independent with ongoing/advanced HEP    Status  On-going       PT LONG TERM GOAL #2   Title  Patient to demonstrate ability to achieve and maintain good spinal and shoulder alignment/posturing    Status  On-going      PT LONG TERM GOAL #3   Title  Patient to report pain reduction in frequency and intensity by >/= 50%    Status  On-going      PT LONG TERM GOAL #4   Title  Patient to improve cervical AROM to [facility] without pain provocation    Status  On-going      PT LONG TERM GOAL #5   Title  Patient will demonstrate improved B shoulder strength to >/= 4+/5 for functional UE use    Status  On-going      PT LONG TERM GOAL #6   Title  Patient to report ability to perform work and daily activities without pain provocation    Status  On-going            Plan - 10/02/19 0948    Clinical Impression Statement  Jelisha doing ok.  Has partially adhered to HEP.  Required heavy cueing with cervical stretching for proper positioning today.  Pt. reporting some concern over recent X-ray that MD discussed some degenerative changes in her cervical spine.  Encouraged pt. in daily performance of HEP with rationale provided as to why postural exercises in HEP reduce burden/strain on cervical spine with pt. verbalizing understanding.  Educated pt. 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  Pt. will likely require further muscular re-education for improved ROM with postural strengthening activities in further visits.    Comorbidities  HTN, endometrial cancer, anxiety, BPPV, L shoulder bursitis, CKD, OA, osteoporosis    Rehab Potential  Good    PT Frequency  2x / week    PT Treatment/Interventions  ADLs/Self Care Home Management;Cryotherapy;Electrical Stimulation;Iontophoresis 4mg /ml Dexamethasone;Moist Heat;Ultrasound;Functional mobility training;Therapeutic activities;Therapeutic exercise;Neuromuscular re-education;Patient/family education;Manual techniques;Passive range of  motion;Dry needling;Taping;Spinal Manipulations;Joint Manipulations    PT Next Visit Plan  Posture and body mechanics education; postural flexibilty and strengthening; cervical ROM; manual therapy to address pain, increased muscle tension and cervical ROM; modalities PRN    PT  Home Exercise Plan  5/28 - cervical retraction, UT and 3-way doorway pec stretches, scap retraction & depression    Consulted and Agree with Plan of Care  Patient       Patient will benefit from skilled therapeutic intervention in order to improve the following deficits and impairments:  Decreased activity tolerance, Decreased knowledge of precautions, Decreased range of motion, Decreased strength, Hypomobility, Increased fascial restricitons, Increased muscle spasms, Impaired perceived functional ability, Impaired flexibility, Impaired UE functional use, Improper body mechanics, Postural dysfunction, Pain  Visit Diagnosis: Radiculopathy, cervicothoracic region  Abnormal posture  Other muscle spasm  Acute pain of right shoulder  Muscle weakness (generalized)     Problem List Patient Active Problem List   Diagnosis Date Noted  . Acute pain of right shoulder 09/30/2019  . Myofascial pain 09/18/2019  . Cervical radiculopathy 09/12/2019  . Essential hypertension, benign 05/18/2013  . SBO (small bowel obstruction) ([place]) 05/17/2013  . Small bowel obstruction ([place]) 05/16/2013  . Malignant neoplasm of corpus uteri, except isthmus ([place]) 05/16/2011    Bess Harvest, PTA 10/02/19 12:24 PM   [facility] [address]  [place], [place], [address] Phone: [phone]   Fax:  [phone]  Name: Haydin Dunn MRN: [id number] Date of Birth: 05-14-1940

## 2019-10-04 DIAGNOSIS — M810 Age-related osteoporosis without current pathological fracture: Secondary | ICD-10-CM | POA: Diagnosis not present

## 2019-10-04 DIAGNOSIS — E1129 Type 2 diabetes mellitus with other diabetic kidney complication: Secondary | ICD-10-CM | POA: Diagnosis not present

## 2019-10-04 DIAGNOSIS — N1831 Chronic kidney disease, stage 3a: Secondary | ICD-10-CM | POA: Diagnosis not present

## 2019-10-04 DIAGNOSIS — R809 Proteinuria, unspecified: Secondary | ICD-10-CM | POA: Diagnosis not present

## 2019-10-04 DIAGNOSIS — M1990 Unspecified osteoarthritis, unspecified site: Secondary | ICD-10-CM | POA: Diagnosis not present

## 2019-10-04 DIAGNOSIS — C541 Malignant neoplasm of endometrium: Secondary | ICD-10-CM | POA: Diagnosis not present

## 2019-10-04 DIAGNOSIS — S329XXS Fracture of unspecified parts of lumbosacral spine and pelvis, sequela: Secondary | ICD-10-CM | POA: Diagnosis not present

## 2019-10-04 DIAGNOSIS — I129 Hypertensive chronic kidney disease with stage 1 through stage 4 chronic kidney disease, or unspecified chronic kidney disease: Secondary | ICD-10-CM | POA: Diagnosis not present

## 2019-10-04 DIAGNOSIS — Z1331 Encounter for screening for depression: Secondary | ICD-10-CM | POA: Diagnosis not present

## 2019-10-04 DIAGNOSIS — E7800 Pure hypercholesterolemia, unspecified: Secondary | ICD-10-CM | POA: Diagnosis not present

## 2019-10-09 DIAGNOSIS — I10 Essential (primary) hypertension: Secondary | ICD-10-CM | POA: Diagnosis not present

## 2019-10-09 DIAGNOSIS — R5383 Other fatigue: Secondary | ICD-10-CM | POA: Diagnosis not present

## 2019-10-14 ENCOUNTER — Other Ambulatory Visit: Payer: Self-pay

## 2019-10-14 ENCOUNTER — Encounter: Payer: Self-pay | Admitting: Physical Therapy

## 2019-10-14 ENCOUNTER — Ambulatory Visit: Payer: Medicare Other | Admitting: Physical Therapy

## 2019-10-14 DIAGNOSIS — M62838 Other muscle spasm: Secondary | ICD-10-CM

## 2019-10-14 DIAGNOSIS — M5413 Radiculopathy, cervicothoracic region: Secondary | ICD-10-CM | POA: Diagnosis not present

## 2019-10-14 DIAGNOSIS — R293 Abnormal posture: Secondary | ICD-10-CM

## 2019-10-14 DIAGNOSIS — M6281 Muscle weakness (generalized): Secondary | ICD-10-CM

## 2019-10-14 DIAGNOSIS — M25511 Pain in right shoulder: Secondary | ICD-10-CM

## 2019-10-14 NOTE — Therapy (Signed)
[facility] [address]  [place], [place], [address] Phone: [phone]   Fax:  [phone]  Physical Therapy Treatment / Progress Note  Patient Details  Name: [name] MRN: [id number] Date of Birth: 1940-11-27 Referring Provider (PT): Clearance Coots, MD  Progress Note  Reporting Period 09/20/2019 to 10/14/2019  See note below for Objective Data and Assessment of Progress/Goals.      Encounter Date: 10/14/2019   PT End of Session - 10/14/19 0945    Visit Number 3    Number of Visits 12    Date for PT Re-Evaluation 11/01/19    Authorization Type Medicare & [employer]    PT Start Time 0945    PT Stop Time 1026    PT Time Calculation (min) 41 min    Activity Tolerance Patient tolerated treatment well    Behavior During Therapy WFL for tasks assessed/performed           Past Medical History:  Diagnosis Date  . Allergy   . Anxiety    takes ativan as needed  . Benign positional vertigo   . Bursitis of shoulder, left   . Cancer [facility])    Endometrial ca/Recurrence  . CKD (chronic kidney disease), stage III   . History of colonic polyps   . Hypercholesteremia   . Hypercholesterolemia   . Hypertension   . Lumbar pain   . Osteoarthritis   . Osteoporosis   . S/P radiation therapy July 29, 2010-Sep 06, 2010   External beam of pelvis  . S/P radiation therapy 09/15/10, 09/24/10, 09/28/2010   Intracavitary brachytherapy  . SBO (small bowel obstruction) ([place])   . Status post chemotherapy    carboplatin/paclitaxel x 6 rounds    Past Surgical History:  Procedure Laterality Date  . ABDOMINAL HYSTERECTOMY    . BREAST LUMPECTOMY  1993   Left breast, benign  . ROBOTIC ASSISTED LAPAROSCOPIC VAGINAL HYSTERECTOMY WITH FIBROID REMOVAL  July 2010   bilat. salpingo-oophorectomy    There were no vitals filed for this visit.   Subjective Assessment - 10/14/19 0949    Subjective Pt reports exercises and  postural awareness have significantly improved her neck pain. Has been feeling more run down to worsening of anemia - MD started her back on iron supplement. Also has been having increased blood sugar - started on Metformin but not sure of dosage strength.    Pertinent History HTN, endometrial cancer, anxiety, BPPV, L shoulder bursitis, CKD, OA, osteoporosis    Diagnostic tests R shoulder x-ray 09/10/19 - Unremarkable    Patient Stated Goals "No pain"    Currently in Pain? Yes    Pain Score 1     Pain Location Neck   & upper shoulder   Pain Orientation Right;Posterior    Pain Descriptors / Indicators Tightness    Pain Type Chronic pain    Pain Radiating Towards occasional numbness and tingling into R hand - less frequent    Pain Frequency Intermittent              [facility] PT Assessment - 10/14/19 0945      Assessment   Medical Diagnosis Cervical radiculopathy, R shoulder pain    Referring Provider (PT) Clearance Coots, MD    Onset Date/Surgical Date --   "at least a couple months"   Hand Dominance Right    Next MD Visit 10/17/19      AROM   Cervical Flexion 68  Cervical Extension 48    Cervical - Right Side Bend 22    Cervical - Left Side Bend 26    Cervical - Right Rotation 58 - pulling    Cervical - Left Rotation 62 - pulling      Strength   Right Shoulder Flexion 4+/5    Right Shoulder ABduction 4/5    Right Shoulder Internal Rotation 4+/5    Right Shoulder External Rotation 4/5    Left Shoulder Flexion 4+/5    Left Shoulder ABduction 4+/5    Left Shoulder Internal Rotation 4+/5    Left Shoulder External Rotation 4+/5                         [facility] Adult PT Treatment/Exercise - 10/14/19 0945      Exercises   Exercises Neck      Neck Exercises: Theraband   Shoulder External Rotation 10 reps   Yellow TB   Shoulder External Rotation Limitations hooklying - cues scapular retraction with keeping elbows tucked at sides    Horizontal ABduction 10 reps    Yellow TB   Horizontal ABduction Limitations hooklying - cues scapular retraction with keeping elbows extended   initially attempt on pool noodle - D/C'd d/t discomfort     Manual Therapy   Manual Therapy Soft tissue mobilization;Myofascial release;Passive ROM    Manual therapy comments hooklying    Soft tissue mobilization STM/DTM to B UT and LS (R>L)    Myofascial Release manual TPR to R UT and LS - pt noting good relief with this; pin and stretch to R UT and LS    Passive ROM gentle cervical PROM within pain tolerance; gentle overpressure into scap retraction & depression      Neck Exercises: Stretches   Upper Trapezius Stretch Right;30 seconds;2 reps    Levator Stretch Right;30 seconds;2 reps                  PT Education - 10/14/19 1026    Education Details HEP update -  LS stretch, hooklying yellow TB scap retraction + B shoulder horiz ABD and shoulder ER    Person(s) Educated Patient    Methods Explanation;Demonstration;Verbal cues;Tactile cues;Handout    Comprehension Verbalized understanding;Returned demonstration;Verbal cues required;Tactile cues required;Need further instruction            PT Short Term Goals - 10/14/19 0956      PT SHORT TERM GOAL #1   Title Patient will be independent with initial HEP    Status Achieved   10/02/19     PT SHORT TERM GOAL #2   Title Patient will verbalize/demonstrate understanding of neutral spine and shoulder posture and proper body mechanics to reduce strain on cervical spine    Status Partially Met    Target Date 10/11/19             PT Long Term Goals - 10/14/19 1003      PT LONG TERM GOAL #1   Title Patient will be independent with ongoing/advanced HEP    Status On-going    Target Date 11/01/19      PT LONG TERM GOAL #2   Title Patient to demonstrate ability to achieve and maintain good spinal and shoulder alignment/posturing    Status On-going    Target Date 11/01/19      PT LONG TERM GOAL #3   Title  Patient to report pain reduction in frequency and intensity by >/= 50%  Status Achieved   10/14/19 - 75% improved     PT LONG TERM GOAL #4   Title Patient to improve cervical AROM to [facility] without pain provocation    Status On-going    Target Date 11/01/19      PT LONG TERM GOAL #5   Title Patient will demonstrate improved B shoulder strength to >/= 4+/5 for functional UE use    Status On-going    Target Date 11/01/19      PT LONG TERM GOAL #6   Title Patient to report ability to perform work and daily activities without pain provocation    Status On-going    Target Date 11/01/19                 Plan - 10/14/19 1026    Clinical Impression Statement In her limited therapy sessions (3 to date), [name] is reporting a 75% reduction in her neck pain with decrease in intensity and frequency of R UE numbness and tingling al noted. She notes benefit from initial HEP and is noting improving postural awareness, although admits she still has need for further improvement. Cervical ROM has improved in all planes and B shoulder strength also improving, although continued scapular and postural strengthening still needed. Increased muscle tension and TPs still present in UT and LS (R>L) but good relief noted with manual STM/DTM and MFR. Currently STG #1 & LTG #3 met with remaining goals partially met or ongoing. [name] will continue to benefit from skilled PT to further address increased muscle tension in neck and upper shoulder as well as improve postural awareness and strength/stability to further reduce pain and UE radiculopathy.    Personal Factors and Comorbidities Age;Comorbidity 3+;Fitness;Past/Current Experience;Profession;Time since onset of injury/illness/exacerbation    Comorbidities HTN, endometrial cancer, anxiety, BPPV, L shoulder bursitis, CKD, OA, osteoporosis    Examination-Activity Limitations Sit;Reach Overhead;Lift;Carry    Examination-Participation Restrictions Cleaning;Community  Activity;Driving;Laundry;Meal Prep    Rehab Potential Good    PT Frequency 2x / week    PT Treatment/Interventions ADLs/Self Care Home Management;Cryotherapy;Electrical Stimulation;Iontophoresis 80m/ml Dexamethasone;Moist Heat;Ultrasound;Functional mobility training;Therapeutic activities;Therapeutic exercise;Neuromuscular re-education;Patient/family education;Manual techniques;Passive range of motion;Dry needling;Taping;Spinal Manipulations;Joint Manipulations    PT Next Visit Plan Posture and body mechanics education; postural flexibilty and strengthening; cervical ROM; manual therapy to address pain, increased muscle tension and cervical ROM; modalities PRN    PT Home Exercise Plan 5/28 - cervical retraction, UT and 3-way doorway pec stretches, scap retraction & depression; 6/21 - LS stretch, hooklying yellow TB scap retraction + B shoulder horiz ABD and shoulder ER    Consulted and Agree with Plan of Care Patient           Patient will benefit from skilled therapeutic intervention in order to improve the following deficits and impairments:  Decreased activity tolerance, Decreased knowledge of precautions, Decreased range of motion, Decreased strength, Hypomobility, Increased fascial restricitons, Increased muscle spasms, Impaired perceived functional ability, Impaired flexibility, Impaired UE functional use, Improper body mechanics, Postural dysfunction, Pain  Visit Diagnosis: Radiculopathy, cervicothoracic region  Abnormal posture  Other muscle spasm  Acute pain of right shoulder  Muscle weakness (generalized)     Problem List Patient Active Problem List   Diagnosis Date Noted  . Acute pain of right shoulder 09/30/2019  . Myofascial pain 09/18/2019  . Cervical radiculopathy 09/12/2019  . Essential hypertension, benign 05/18/2013  . [place] 05/17/2013  . [place] 05/16/2013  . Malignant neoplasm of corpus uteri, except isthmus  (  [place]) 05/16/2011    Percival Spanish, PT, MPT 10/14/2019, 1:26 PM  [facility] [address]  [place], [place], [address] Phone: [phone]   Fax:  [phone]  Name: [name] MRN: [id number] Date of Birth: 1940/10/31

## 2019-10-14 NOTE — Patient Instructions (Signed)
    Home exercise program created by Tewana Bohlen, PT.  For questions, please contact Saw Mendenhall via phone at [phone] or email at [email or web address]  [facility] [place] [facility] [address]  [address] [place], [place], [address] Phone: [phone]   Fax:  [phone]    

## 2019-10-17 ENCOUNTER — Other Ambulatory Visit: Payer: Self-pay

## 2019-10-17 ENCOUNTER — Ambulatory Visit: Payer: Medicare Other | Admitting: Physical Therapy

## 2019-10-17 ENCOUNTER — Ambulatory Visit (INDEPENDENT_AMBULATORY_CARE_PROVIDER_SITE_OTHER): Payer: Medicare Other | Admitting: Family Medicine

## 2019-10-17 ENCOUNTER — Encounter: Payer: Self-pay | Admitting: Physical Therapy

## 2019-10-17 DIAGNOSIS — M25511 Pain in right shoulder: Secondary | ICD-10-CM | POA: Diagnosis present

## 2019-10-17 DIAGNOSIS — M62838 Other muscle spasm: Secondary | ICD-10-CM

## 2019-10-17 DIAGNOSIS — R293 Abnormal posture: Secondary | ICD-10-CM

## 2019-10-17 DIAGNOSIS — M5413 Radiculopathy, cervicothoracic region: Secondary | ICD-10-CM | POA: Diagnosis not present

## 2019-10-17 DIAGNOSIS — M5412 Radiculopathy, cervical region: Secondary | ICD-10-CM | POA: Diagnosis not present

## 2019-10-17 DIAGNOSIS — M6281 Muscle weakness (generalized): Secondary | ICD-10-CM

## 2019-10-17 NOTE — Progress Notes (Signed)
[name] - 79 y.o. female MRN [id number]  Date of birth: 23-Feb-1941  SUBJECTIVE:  Including CC & ROS.  No chief complaint on file.   [name] is a 79 y.o. female that is presenting with neck pain and right arm pain.  Her shoulder has done better since the injection.  She is also been in physical therapy and that seems to improve her pain.  Symptoms are slowly starting to improve.  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   Review of Systems See HPI   HISTORY: Past Medical, Surgical, Social, and Family History Reviewed & Updated per EMR.   Pertinent Historical Findings include:  Past Medical History:  Diagnosis Date  . Allergy   . Anxiety    takes ativan as needed  . Benign positional vertigo   . Bursitis of shoulder, left   . Cancer [facility])    Endometrial ca/Recurrence  . CKD (chronic kidney disease), stage III   . History of colonic polyps   . Hypercholesteremia   . Hypercholesterolemia   . Hypertension   . Lumbar pain   . Osteoarthritis   . Osteoporosis   . S/P radiation therapy July 29, 2010-Sep 06, 2010   External beam of pelvis  . S/P radiation therapy 09/15/10, 09/24/10, 09/28/2010   Intracavitary brachytherapy  . SBO (small bowel obstruction) ([place])   . Status post chemotherapy    carboplatin/paclitaxel x 6 rounds    Past Surgical History:  Procedure Laterality Date  . ABDOMINAL HYSTERECTOMY    . BREAST LUMPECTOMY  1993   Left breast, benign  . ROBOTIC ASSISTED LAPAROSCOPIC VAGINAL HYSTERECTOMY WITH FIBROID REMOVAL  July 2010   bilat. salpingo-oophorectomy    Family History  Problem Relation Age of Onset  . Breast cancer Mother   . Heart disease Father   . Breast cancer Sister   . Diabetes Sister   . Colon cancer Neg Hx   . Esophageal cancer Neg Hx   . Rectal cancer Neg Hx   . Stomach cancer Neg Hx     Social History   Socioeconomic  History  . Marital status: Widowed    Spouse name: Not on file  . Number of children: Not on file  . Years of education: Not on file  . Highest education level: Not on file  Occupational History  . Not on file  Tobacco Use  . Smoking status: Former Smoker    Packs/day: 1.00    Types: Cigarettes    Quit date: 05/12/1976    Years since quitting: 43.4  . Smokeless tobacco: Never Used  Vaping Use  . Vaping Use: Never used  Substance and Sexual Activity  . Alcohol use: No  . Drug use: No  . Sexual activity: Never  Other Topics Concern  . Not on file  Social History Narrative  . Not on file   Social Determinants of Health   Financial Resource Strain:   . Difficulty of Paying Living Expenses:   Food Insecurity:   . Worried About Charity fundraiser in the Last Year:   . Arboriculturist in the Last Year:   Transportation Needs:   . Film/video editor (Medical):   Marland Kitchen Lack of Transportation (Non-Medical):   Physical Activity:   . Days of Exercise per Week:   . Minutes of Exercise per Session:   Stress:   . Feeling of Stress :  Social Connections:   . Frequency of Communication with Friends and Family:   . Frequency of Social Gatherings with Friends and Family:   . Attends Religious Services:   . Active Member of Clubs or Organizations:   . Attends Archivist Meetings:   Marland Kitchen Marital Status:   Intimate Partner Violence:   . Fear of Current or Ex-Partner:   . Emotionally Abused:   Marland Kitchen Physically Abused:   . Sexually Abused:      PHYSICAL EXAM:  VS: BP 137/81   Ht 5\' 5"  (1.651 m)   Wt 150 lb (68 kg)   BMI 24.96 kg/m  Physical Exam Gen: NAD, alert, cooperative with exam, well-appearing MSK:  Neck: Head held in a forward flexed position. Limited lateral rotation. Neurovascularly intact     ASSESSMENT & PLAN:   Acute pain of right shoulder May have a component of impingement as she did get some improvement with the injection. -Counseled on home  exercise therapy and supportive care. -Could repeat injection if needed.  Cervical radiculopathy Slowly started to get some improvement.  She still feels tightness in around her neck. -Counseled on home exercise therapy and supportive care. -Has continue physical therapy. -Counseled on maintaining bone health with vitamin D and medication. -May need consider MRI for epidural.

## 2019-10-17 NOTE — Therapy (Addendum)
[facility] [address]  [place], [place], [address] Phone: [phone]   Fax:  [phone]  Physical Therapy Treatment  Patient Details  Name: [name] MRN: [id number] Date of Birth: [birth year] Referring Provider (PT): Clearance Coots, MD   Encounter Date: 10/17/2019   PT End of Session - 10/17/19 0851    Visit Number 4    Number of Visits 12    Date for PT Re-Evaluation 11/01/19    Authorization Type Medicare & [employer]    Progress Note Due on Visit 12   MD PN completed on visit #3 - 10/14/19   PT Start Time 0851   Pt arrived late   PT Stop Time 0927   Pt had to leave for MD appt   PT Time Calculation (min) 36 min    Activity Tolerance Patient tolerated treatment well    Behavior During Therapy [facility] for tasks assessed/performed           Past Medical History:  Diagnosis Date  . Allergy   . Anxiety    takes ativan as needed  . Benign positional vertigo   . Bursitis of shoulder, left   . Cancer [facility])    Endometrial ca/Recurrence  . CKD (chronic kidney disease), stage III   . History of colonic polyps   . Hypercholesteremia   . Hypercholesterolemia   . Hypertension   . Lumbar pain   . Osteoarthritis   . Osteoporosis   . S/P radiation therapy July 29, 2010-Sep 06, 2010   External beam of pelvis  . S/P radiation therapy 09/15/10, 09/24/10, 09/28/2010   Intracavitary brachytherapy  . SBO (small bowel obstruction) ([place])   . Status post chemotherapy    carboplatin/paclitaxel x 6 rounds    Past Surgical History:  Procedure Laterality Date  . ABDOMINAL HYSTERECTOMY    . BREAST LUMPECTOMY  1993   Left breast, benign  . ROBOTIC ASSISTED LAPAROSCOPIC VAGINAL HYSTERECTOMY WITH FIBROID REMOVAL  July 2010   bilat. salpingo-oophorectomy    There were no vitals filed for this visit.   Subjective Assessment - 10/17/19 0854    Subjective Pt noting only intermittent "twinges" of pain this morning up  to 3/10 but otherwise denies pain.    Pertinent History HTN, endometrial cancer, anxiety, BPPV, L shoulder bursitis, CKD, OA, osteoporosis    Diagnostic tests R shoulder x-ray 09/10/19 - Unremarkable    Patient Stated Goals "No pain"    Currently in Pain? No/denies    Pain Score 0-No pain   up to 3/10 "twinge" at times   Pain Location Shoulder    Pain Orientation Right    Pain Descriptors / Indicators --   "twinges"   Pain Type Chronic pain    Pain Radiating Towards none today    Pain Frequency Intermittent                             [facility] Adult PT Treatment/Exercise - 10/17/19 0851      Self-Care   Self-Care Posture    Posture Provided instruction in proper posture and body mechancis for typical daily tasks - see handout      Exercises   Exercises Neck      Neck Exercises: Machines for Strengthening   Nustep L2 x 3 min (UE/LE) - stopped d/t worsening shoulder discomfort   seat #8, arms #9     Neck Exercises: Sidelying  Other Sidelying Exercise R open book stretch 10 x 5" - PT guiding movement      Manual Therapy   Manual Therapy Soft tissue mobilization;Myofascial release;Scapular mobilization    Manual therapy comments L sidelying    Soft tissue mobilization STM/DTM to R UT, LS and periscapular muscles    Myofascial Release manual TPR to R UT and LS - pt noting good relief with this; pin and stretch to R UT and LS    Scapular Mobilization R scapula retraction & depression      Neck Exercises: Stretches   Upper Trapezius Stretch Right;30 seconds;2 reps    Levator Stretch Right;30 seconds;2 reps                    PT Short Term Goals - 10/14/19 0956      PT SHORT TERM GOAL #1   Title Patient will be independent with initial HEP    Status Achieved   10/02/19     PT SHORT TERM GOAL #2   Title Patient will verbalize/demonstrate understanding of neutral spine and shoulder posture and proper body mechanics to reduce strain on cervical spine     Status Partially Met    Target Date 10/11/19             PT Long Term Goals - 10/14/19 1003      PT LONG TERM GOAL #1   Title Patient will be independent with ongoing/advanced HEP    Status On-going    Target Date 11/01/19      PT LONG TERM GOAL #2   Title Patient to demonstrate ability to achieve and maintain good spinal and shoulder alignment/posturing    Status On-going    Target Date 11/01/19      PT LONG TERM GOAL #3   Title Patient to report pain reduction in frequency and intensity by >/= 50%    Status Achieved   10/14/19 - 75% improved     PT LONG TERM GOAL #4   Title Patient to improve cervical AROM to [facility] without pain provocation    Status On-going    Target Date 11/01/19      PT LONG TERM GOAL #5   Title Patient will demonstrate improved B shoulder strength to >/= 4+/5 for functional UE use    Status On-going    Target Date 11/01/19      PT LONG TERM GOAL #6   Title Patient to report ability to perform work and daily activities without pain provocation    Status On-going    Target Date 11/01/19                 Plan - 10/17/19 0858    Clinical Impression Statement Sarabelle notes good relief from manual therapy last session with less neck pain today but still having some "twinges" of R shoulder pain - addressed with further manual therapy and stretching with good relief and less pain noted. She admits to limited opportunity to attempt TB exercises from latest HEP update but states stretches have been helping. Provided education for proper posture and body mechanics for typical daily tasks, reinforcing computer setup and posture while working on computer, with pt acknowledging good understanding. She will benefit from continued postural strengthening in upcoming visits.    Personal Factors and Comorbidities Age;Comorbidity 3+;Fitness;Past/Current Experience;Profession;Time since onset of injury/illness/exacerbation    Comorbidities HTN, endometrial cancer,  anxiety, BPPV, L shoulder bursitis, CKD, OA, osteoporosis    Examination-Activity Limitations Sit;Reach Overhead;Lift;Carry  Examination-Participation Restrictions Cleaning;Community Activity;Driving;Laundry;Meal Prep    Rehab Potential Good    PT Frequency 2x / week    PT Treatment/Interventions ADLs/Self Care Home Management;Cryotherapy;Electrical Stimulation;Iontophoresis 4m/ml Dexamethasone;Moist Heat;Ultrasound;Functional mobility training;Therapeutic activities;Therapeutic exercise;Neuromuscular re-education;Patient/family education;Manual techniques;Passive range of motion;Dry needling;Taping;Spinal Manipulations;Joint Manipulations    PT Next Visit Plan postural flexibilty and strengthening; cervical ROM; manual therapy to address pain, increased muscle tension and cervical ROM; modalities PRN; review of posture and body mechanics education PRN    PT Home Exercise Plan 5/28 - cervical retraction, UT and 3-way doorway pec stretches, scap retraction & depression; 6/21 - LS stretch, hooklying yellow TB scap retraction + B shoulder horiz ABD and shoulder ER    Consulted and Agree with Plan of Care Patient           Patient will benefit from skilled therapeutic intervention in order to improve the following deficits and impairments:  Decreased activity tolerance, Decreased knowledge of precautions, Decreased range of motion, Decreased strength, Hypomobility, Increased fascial restricitons, Increased muscle spasms, Impaired perceived functional ability, Impaired flexibility, Impaired UE functional use, Improper body mechanics, Postural dysfunction, Pain  Visit Diagnosis: Radiculopathy, cervicothoracic region  Abnormal posture  Other muscle spasm  Acute pain of right shoulder  Muscle weakness (generalized)     Problem List Patient Active Problem List   Diagnosis Date Noted  . Acute pain of right shoulder 09/30/2019  . Myofascial pain 09/18/2019  . Cervical radiculopathy  09/12/2019  . Essential hypertension, benign 05/18/2013  . SBO ([place] bowel obstruction) ([place] 05/17/2013  . Small bowel obstruction ([place] 05/16/2013  . Malignant neoplasm of corpus uteri, except isthmus ([place] 05/16/2011    JPercival Spanish PT, MPT 10/17/2019, 12:33 PM  [facility] [place] [address]Phone: [phone]  Fax:  [phone] Name: DKalle BernathMRN: [id number] of Birth: 815-Jan-1942

## 2019-10-17 NOTE — Patient Instructions (Signed)

## 2019-10-18 ENCOUNTER — Telehealth: Payer: Self-pay | Admitting: Family Medicine

## 2019-10-18 DIAGNOSIS — M8588 Other specified disorders of bone density and structure, other site: Secondary | ICD-10-CM

## 2019-10-18 NOTE — Assessment & Plan Note (Signed)
May have a component of impingement as she did get some improvement with the injection. -Counseled on home exercise therapy and supportive care. -Could repeat injection if needed.

## 2019-10-18 NOTE — Assessment & Plan Note (Signed)
Slowly started to get some improvement.  She still feels tightness in around her neck. -Counseled on home exercise therapy and supportive care. -Has continue physical therapy. -Counseled on maintaining bone health with vitamin D and medication. -May need consider MRI for epidural.

## 2019-10-18 NOTE — Telephone Encounter (Signed)
Patient is requesting an order for a bone density study. She checked with her PCP and they cannot schedule her until December

## 2019-10-21 ENCOUNTER — Ambulatory Visit: Payer: Medicare Other | Admitting: Physical Therapy

## 2019-10-21 ENCOUNTER — Other Ambulatory Visit: Payer: Self-pay

## 2019-10-21 ENCOUNTER — Encounter: Payer: Self-pay | Admitting: Physical Therapy

## 2019-10-21 DIAGNOSIS — M6281 Muscle weakness (generalized): Secondary | ICD-10-CM

## 2019-10-21 DIAGNOSIS — M5413 Radiculopathy, cervicothoracic region: Secondary | ICD-10-CM

## 2019-10-21 DIAGNOSIS — R293 Abnormal posture: Secondary | ICD-10-CM

## 2019-10-21 DIAGNOSIS — M62838 Other muscle spasm: Secondary | ICD-10-CM

## 2019-10-21 DIAGNOSIS — M25511 Pain in right shoulder: Secondary | ICD-10-CM

## 2019-10-21 NOTE — Therapy (Signed)
[facility] [address]  [place], [place], [address] Phone: [phone]   Fax:  [phone]  Physical Therapy Treatment  Patient Details  Name: [name] MRN: [id number] Date of Birth: [birth year] Referring Provider (PT): Clearance Coots, MD   Encounter Date: 10/21/2019   PT End of Session - 10/21/19 0939    Visit Number 5    Number of Visits 12    Date for PT Re-Evaluation 11/01/19    Authorization Type Medicare & [employer]    Progress Note Due on Visit 12   MD PN completed on visit #3 - 10/14/19   PT Start Time 0939   Pt arrived late   PT Stop Time 1017    PT Time Calculation (min) 38 min    Activity Tolerance Patient tolerated treatment well    Behavior During Therapy [facility] for tasks assessed/performed           Past Medical History:  Diagnosis Date  . Allergy   . Anxiety    takes ativan as needed  . Benign positional vertigo   . Bursitis of shoulder, left   . Cancer [facility])    Endometrial ca/Recurrence  . CKD (chronic kidney disease), stage III   . History of colonic polyps   . Hypercholesteremia   . Hypercholesterolemia   . Hypertension   . Lumbar pain   . Osteoarthritis   . Osteoporosis   . S/P radiation therapy July 29, 2010-Sep 06, 2010   External beam of pelvis  . S/P radiation therapy 09/15/10, 09/24/10, 09/28/2010   Intracavitary brachytherapy  . SBO (small bowel obstruction) ([place])   . Status post chemotherapy    carboplatin/paclitaxel x 6 rounds    Past Surgical History:  Procedure Laterality Date  . ABDOMINAL HYSTERECTOMY    . BREAST LUMPECTOMY  1993   Left breast, benign  . ROBOTIC ASSISTED LAPAROSCOPIC VAGINAL HYSTERECTOMY WITH FIBROID REMOVAL  July 2010   bilat. salpingo-oophorectomy    There were no vitals filed for this visit.   Subjective Assessment - 10/21/19 0944    Subjective Pt reporting she is not feeling as well today - thinks it may be related to her blood  sugar (160 this morning). Reports increased R shoulder pain yesterday but thinks it may have been due to how she slept on it.    Pertinent History HTN, endometrial cancer, anxiety, BPPV, L shoulder bursitis, CKD, OA, osteoporosis    Diagnostic tests R shoulder x-ray 09/10/19 - Unremarkable    Patient Stated Goals "No pain"    Currently in Pain? Yes    Pain Score 3     Pain Location Shoulder    Pain Orientation Right    Pain Descriptors / Indicators Aching    Pain Type Chronic pain    Pain Radiating Towards intermittent down back of R arm to elbow    Pain Frequency Intermittent              [facility] PT Assessment - 10/21/19 0939      Assessment   Medical Diagnosis Cervical radiculopathy, R shoulder pain    Referring Provider (PT) Clearance Coots, MD    Next MD Visit 12/19/19                         [facility] Adult PT Treatment/Exercise - 10/21/19 0001      Exercises   Exercises Neck      Neck Exercises:  Machines for Strengthening   Nustep L2 x 6 min (UE/LE) - well tolerated   seat #8, arms #9     Neck Exercises: Theraband   Shoulder External Rotation 10 reps   Yellow TB   Shoulder External Rotation Limitations hooklying - cues scapular retraction with keeping elbows tucked at sides    Horizontal ABduction 10 reps   Yellow TB   Horizontal ABduction Limitations hooklying on towel roll - cues scapular retraction with keeping elbows extended and pulling band down to chest      Neck Exercises: Supine   Neck Retraction 10 reps;5 secs    Neck Retraction Limitations into pillow      Manual Therapy   Manual Therapy Soft tissue mobilization;Myofascial release;Passive ROM    Manual therapy comments hooklying on towel roll    Soft tissue mobilization STM/DTM to B UT and LS (R>L)    Myofascial Release manual TPR to R UT and LS - pt noting good relief with this; pin and stretch to R UT and LS    Passive ROM manual R UT & LS stretches; gentle cervical PROM within pain tolerance;  gentle overpressure into scap retraction & depression over towel roll                    PT Short Term Goals - 10/14/19 0956      PT SHORT TERM GOAL #1   Title Patient will be independent with initial HEP    Status Achieved   10/02/19     PT SHORT TERM GOAL #2   Title Patient will verbalize/demonstrate understanding of neutral spine and shoulder posture and proper body mechanics to reduce strain on cervical spine    Status Partially Met    Target Date 10/11/19             PT Long Term Goals - 10/14/19 1003      PT LONG TERM GOAL #1   Title Patient will be independent with ongoing/advanced HEP    Status On-going    Target Date 11/01/19      PT LONG TERM GOAL #2   Title Patient to demonstrate ability to achieve and maintain good spinal and shoulder alignment/posturing    Status On-going    Target Date 11/01/19      PT LONG TERM GOAL #3   Title Patient to report pain reduction in frequency and intensity by >/= 50%    Status Achieved   10/14/19 - 75% improved     PT LONG TERM GOAL #4   Title Patient to improve cervical AROM to [facility] without pain provocation    Status On-going    Target Date 11/01/19      PT LONG TERM GOAL #5   Title Patient will demonstrate improved B shoulder strength to >/= 4+/5 for functional UE use    Status On-going    Target Date 11/01/19      PT LONG TERM GOAL #6   Title Patient to report ability to perform work and daily activities without pain provocation    Status On-going    Target Date 11/01/19                 Plan - 10/21/19 0950    Clinical Impression Statement [name] reporting increase in R shoulder pain yesterday which she attributes to her sleeping position but not as bad today, although not feeling well related to her blood sugars today. She continues to demonstrate forward head and rounded shoulder  posture but notes she is trying to be more conscious of her posture. Recent HEP update reviewed with pt requiring moderate  cueing for proper scap retraction + horiz ABD but able to perform exercise without increased pain while laying on rolled towel to promote improved scapular retraction. Continued increased muscle tension/TPs identified in R UT > LS which responded well to manual therapy with pt noting decreased pain by end of session.    Personal Factors and Comorbidities Age;Comorbidity 3+;Fitness;Past/Current Experience;Profession;Time since onset of injury/illness/exacerbation    Comorbidities HTN, endometrial cancer, anxiety, BPPV, L shoulder bursitis, CKD, OA, osteoporosis    Examination-Activity Limitations Sit;Reach Overhead;Lift;Carry    Examination-Participation Restrictions Cleaning;Community Activity;Driving;Laundry;Meal Prep    Rehab Potential Good    PT Frequency 2x / week    PT Treatment/Interventions ADLs/Self Care Home Management;Cryotherapy;Electrical Stimulation;Iontophoresis 32m/ml Dexamethasone;Moist Heat;Ultrasound;Functional mobility training;Therapeutic activities;Therapeutic exercise;Neuromuscular re-education;Patient/family education;Manual techniques;Passive range of motion;Dry needling;Taping;Spinal Manipulations;Joint Manipulations    PT Next Visit Plan postural flexibilty and strengthening; cervical ROM; manual therapy to address pain, increased muscle tension and cervical ROM; modalities PRN; review of posture and body mechanics education PRN    PT Home Exercise Plan 5/28 - cervical retraction, UT and 3-way doorway pec stretches, scap retraction & depression; 6/21 - LS stretch, hooklying yellow TB scap retraction + B shoulder horiz ABD and shoulder ER    Consulted and Agree with Plan of Care Patient           Patient will benefit from skilled therapeutic intervention in order to improve the following deficits and impairments:  Decreased activity tolerance, Decreased knowledge of precautions, Decreased range of motion, Decreased strength, Hypomobility, Increased fascial restricitons,  Increased muscle spasms, Impaired perceived functional ability, Impaired flexibility, Impaired UE functional use, Improper body mechanics, Postural dysfunction, Pain  Visit Diagnosis: Radiculopathy, cervicothoracic region  Abnormal posture  Other muscle spasm  Acute pain of right shoulder  Muscle weakness (generalized)     Problem List Patient Active Problem List   Diagnosis Date Noted  . Acute pain of right shoulder 09/30/2019  . Myofascial pain 09/18/2019  . Cervical radiculopathy 09/12/2019  . Essential hypertension, benign 05/18/2013  . S[place] ([place] obstruction) ([place] 05/17/2013  . Small bowel obstruction ([place] 05/16/2013  . Malignant neoplasm of corpus uteri, except isthmus ([place] 05/16/2011    JPercival Spanish PT, MPT 10/21/2019, 12:29 PM  [facility] [place] [address]Phone: [phone]  Fax:  [phone] Name: DBrook GeraciMRN: [id number] of Birth: [birth year]

## 2019-10-21 NOTE — Telephone Encounter (Signed)
Spoke to patient and informed her that someone from imaging will contact her to get her scheduled

## 2019-10-24 ENCOUNTER — Ambulatory Visit: Payer: Medicare Other | Attending: Family Medicine | Admitting: Physical Therapy

## 2019-10-24 ENCOUNTER — Other Ambulatory Visit: Payer: Self-pay

## 2019-10-24 ENCOUNTER — Encounter: Payer: Self-pay | Admitting: Physical Therapy

## 2019-10-24 ENCOUNTER — Ambulatory Visit (HOSPITAL_BASED_OUTPATIENT_CLINIC_OR_DEPARTMENT_OTHER)
Admission: RE | Admit: 2019-10-24 | Discharge: 2019-10-24 | Disposition: A | Discharge status: Home / Self Care | Payer: Medicare Other | Source: Ambulatory Visit | Attending: Family Medicine | Admitting: Family Medicine

## 2019-10-24 DIAGNOSIS — R293 Abnormal posture: Secondary | ICD-10-CM | POA: Diagnosis not present

## 2019-10-24 DIAGNOSIS — M6281 Muscle weakness (generalized): Secondary | ICD-10-CM | POA: Diagnosis not present

## 2019-10-24 DIAGNOSIS — M5413 Radiculopathy, cervicothoracic region: Secondary | ICD-10-CM | POA: Insufficient documentation

## 2019-10-24 DIAGNOSIS — M62838 Other muscle spasm: Secondary | ICD-10-CM

## 2019-10-24 DIAGNOSIS — M8588 Other specified disorders of bone density and structure, other site: Secondary | ICD-10-CM | POA: Insufficient documentation

## 2019-10-24 DIAGNOSIS — M25511 Pain in right shoulder: Secondary | ICD-10-CM

## 2019-10-24 NOTE — Therapy (Signed)
[facility] [address]  [place], [place], [address] Phone: [phone]   Fax:  [phone]  Physical Therapy Treatment  Patient Details  Name: [name] MRN: [id number] Date of Birth: 1941-03-30 Referring Provider (PT): Clearance Coots, MD   Encounter Date: 10/24/2019   PT End of Session - 10/24/19 0932    Visit Number 6    Number of Visits 12    Date for PT Re-Evaluation 11/01/19    Authorization Type Medicare & [employer]    Progress Note Due on Visit 12   MD PN completed on visit #3 - 10/14/19   PT Start Time 0932    PT Stop Time 1014    PT Time Calculation (min) 42 min    Activity Tolerance Patient tolerated treatment well    Behavior During Therapy [facility] for tasks assessed/performed           Past Medical History:  Diagnosis Date  . Allergy   . Anxiety    takes ativan as needed  . Benign positional vertigo   . Bursitis of shoulder, left   . Cancer [facility])    Endometrial ca/Recurrence  . CKD (chronic kidney disease), stage III   . History of colonic polyps   . Hypercholesteremia   . Hypercholesterolemia   . Hypertension   . Lumbar pain   . Osteoarthritis   . Osteoporosis   . S/P radiation therapy July 29, 2010-Sep 06, 2010   External beam of pelvis  . S/P radiation therapy 09/15/10, 09/24/10, 09/28/2010   Intracavitary brachytherapy  . SBO (small bowel obstruction) ([place])   . Status post chemotherapy    carboplatin/paclitaxel x 6 rounds    Past Surgical History:  Procedure Laterality Date  . ABDOMINAL HYSTERECTOMY    . BREAST LUMPECTOMY  1993   Left breast, benign  . ROBOTIC ASSISTED LAPAROSCOPIC VAGINAL HYSTERECTOMY WITH FIBROID REMOVAL  July 2010   bilat. salpingo-oophorectomy    There were no vitals filed for this visit.   Subjective Assessment - 10/24/19 0936    Subjective Pt reports she has not been able to work on the theraband exercises due to having to run over to the drug  store multiple times trying to get her BS strips. Has a lot of stress in her life with multiple issues with close family members.    Pertinent History HTN, endometrial cancer, anxiety, BPPV, L shoulder bursitis, CKD, OA, osteoporosis    Diagnostic tests R shoulder x-ray 09/10/19 - Unremarkable    Patient Stated Goals "No pain"    Currently in Pain? Yes    Pain Score 2     Pain Location Shoulder    Pain Orientation Right    Pain Descriptors / Indicators Aching    Pain Type Chronic pain    Pain Radiating Towards intermittent down back of R arm to elbow & wrist    Pain Frequency Intermittent                             [facility] Adult PT Treatment/Exercise - 10/24/19 0932      Exercises   Exercises Neck      Neck Exercises: Theraband   Shoulder External Rotation 10 reps   Yellow TB   Shoulder External Rotation Limitations standing with back to doorframe as cues scapular retraction, towel rolls under elbows to keep elbows tucked at sides    Horizontal ABduction 10  reps   Yellow TB   Horizontal ABduction Limitations standing with back against doorframeas cue for scapular retraction with keeping elbows extended and pulling band down to chest      Neck Exercises: Seated   Neck Retraction 10 reps;5 secs    Neck Retraction Limitations yellow TB    Other Seated Exercise Thoracic extension over back of chair with hands laced behind head 10 x 3"      Neck Exercises: Supine   Other Supine Exercise Thoracic extension over pool noodle under scapulae + shoulder horiz ABD & flexion x 10 each      Manual Therapy   Manual Therapy Soft tissue mobilization;Myofascial release;Joint mobilization;Passive ROM    Manual therapy comments hooklying    Joint Mobilization R 1st & 2nd rib P/A and inferior mobs    Soft tissue mobilization STM/DTM to B UT, LS and pecs (R>L)    Myofascial Release manual TPR to R UT and LS    Passive ROM gentle overpressure into scap retraction & depression                      PT Short Term Goals - 10/14/19 0956      PT SHORT TERM GOAL #1   Title Patient will be independent with initial HEP    Status Achieved   10/02/19     PT SHORT TERM GOAL #2   Title Patient will verbalize/demonstrate understanding of neutral spine and shoulder posture and proper body mechanics to reduce strain on cervical spine    Status Partially Met    Target Date 10/11/19             PT Long Term Goals - 10/14/19 1003      PT LONG TERM GOAL #1   Title Patient will be independent with ongoing/advanced HEP    Status On-going    Target Date 11/01/19      PT LONG TERM GOAL #2   Title Patient to demonstrate ability to achieve and maintain good spinal and shoulder alignment/posturing    Status On-going    Target Date 11/01/19      PT LONG TERM GOAL #3   Title Patient to report pain reduction in frequency and intensity by >/= 50%    Status Achieved   10/14/19 - 75% improved     PT LONG TERM GOAL #4   Title Patient to improve cervical AROM to WFL without pain provocation    Status On-going    Target Date 11/01/19      PT LONG TERM GOAL #5   Title Patient will demonstrate improved B shoulder strength to >/= 4+/5 for functional UE use    Status On-going    Target Date 11/01/19      PT LONG TERM GOAL #6   Title Patient to report ability to perform work and daily activities without pain provocation    Status On-going    Target Date 11/01/19                 Plan - 10/24/19 0942    Clinical Impression Statement Latorria reports she still has not attempted the latest HEP update with theraband exercises due to very busy and stressful life at present but has been trying to be more aware of her posture. Reviewed yellow TB scapular retraction exercises providing instruction in alternative positions (seated with pool noodle along spine or standing with back along doorframe) which would still provide tactile feedback to promote scapular   retraction. Promoted  thoracic extension and cervical retraction to further improve postural alignment to reduce muscle strain at neck and shoulders but limited tolerance for extension over pool noodle. Continued benefit noted from manual therapy with decreased R shoulder elevation noted following MT.    Personal Factors and Comorbidities Age;Comorbidity 3+;Fitness;Past/Current Experience;Profession;Time since onset of injury/illness/exacerbation    Comorbidities HTN, endometrial cancer, anxiety, BPPV, L shoulder bursitis, CKD, OA, osteoporosis    Examination-Activity Limitations Sit;Reach Overhead;Lift;Carry    Examination-Participation Restrictions Cleaning;Community Activity;Driving;Laundry;Meal Prep    Rehab Potential Good    PT Frequency 2x / week    PT Treatment/Interventions ADLs/Self Care Home Management;Cryotherapy;Electrical Stimulation;Iontophoresis 73m/ml Dexamethasone;Moist Heat;Ultrasound;Functional mobility training;Therapeutic activities;Therapeutic exercise;Neuromuscular re-education;Patient/family education;Manual techniques;Passive range of motion;Dry needling;Taping;Spinal Manipulations;Joint Manipulations    PT Next Visit Plan Recert; postural flexibilty and strengthening; cervical ROM; manual therapy to address pain, increased muscle tension and cervical ROM; modalities PRN; review of posture and body mechanics education PRN    PT Home Exercise Plan 5/28 - cervical retraction, UT and 3-way doorway pec stretches, scap retraction & depression; 6/21 - LS stretch, hooklying yellow TB scap retraction + B shoulder horiz ABD and shoulder ER    Consulted and Agree with Plan of Care Patient           Patient will benefit from skilled therapeutic intervention in order to improve the following deficits and impairments:  Decreased activity tolerance, Decreased knowledge of precautions, Decreased range of motion, Decreased strength, Hypomobility, Increased fascial restricitons, Increased muscle spasms, Impaired  perceived functional ability, Impaired flexibility, Impaired UE functional use, Improper body mechanics, Postural dysfunction, Pain  Visit Diagnosis: Radiculopathy, cervicothoracic region  Abnormal posture  Other muscle spasm  Acute pain of right shoulder  Muscle weakness (generalized)     Problem List Patient Active Problem List   Diagnosis Date Noted  . Acute pain of right shoulder 09/30/2019  . Myofascial pain 09/18/2019  . Cervical radiculopathy 09/12/2019  . Essential hypertension, benign 05/18/2013  . [place] ([place]) ([place] 05/17/2013  . [place] 05/16[place]2013  . Malignant neoplasm of corpus uteri, except isthmus ([place] 05/16/2011    JPercival Spanish PT, MPT 10/24/2019, 10:36 AM  [facility] [place] [address]Phone: [phone]  Fax:  [phone] Name: DSyvilla MartinMRN: [id number] of Birth: [birth year]

## 2019-10-30 ENCOUNTER — Telehealth: Payer: Self-pay | Admitting: Family Medicine

## 2019-10-30 DIAGNOSIS — M542 Cervicalgia: Secondary | ICD-10-CM

## 2019-10-30 NOTE — Telephone Encounter (Signed)
Spoke with patient about dexa scan. Will she her PCP tomorrow and discuss. Will proceed with MRI of cervical spine as her pain seems to have intensified in radicular fashion.  Rosemarie Ax, MD [facility] 10/30/2019, 8:17 AM

## 2019-10-31 ENCOUNTER — Encounter: Payer: Self-pay | Admitting: Physical Therapy

## 2019-10-31 ENCOUNTER — Other Ambulatory Visit: Payer: Self-pay

## 2019-10-31 ENCOUNTER — Ambulatory Visit: Payer: Medicare Other | Admitting: Physical Therapy

## 2019-10-31 DIAGNOSIS — R293 Abnormal posture: Secondary | ICD-10-CM | POA: Diagnosis not present

## 2019-10-31 DIAGNOSIS — M6281 Muscle weakness (generalized): Secondary | ICD-10-CM

## 2019-10-31 DIAGNOSIS — M62838 Other muscle spasm: Secondary | ICD-10-CM

## 2019-10-31 DIAGNOSIS — M25511 Pain in right shoulder: Secondary | ICD-10-CM | POA: Diagnosis not present

## 2019-10-31 DIAGNOSIS — M5413 Radiculopathy, cervicothoracic region: Secondary | ICD-10-CM | POA: Diagnosis not present

## 2019-10-31 NOTE — Patient Instructions (Signed)

## 2019-10-31 NOTE — Therapy (Signed)
[facility] [address]  [place], [place], [address] Phone: [phone]   Fax:  [phone]  Physical Therapy Treatment / Recert  Patient Details  Name: [name] MRN: [id number] Date of Birth: [birth year] Referring Provider (PT): Clearance Coots, MD  Progress Note  Reporting Period 10/14/2019 to 10/31/2019  See note below for Objective Data and Assessment of Progress/Goals.     Encounter Date: 10/31/2019   PT End of Session - 10/31/19 1530    Visit Number 7    Number of Visits 19    Date for PT Re-Evaluation 12/12/19    Authorization Type Medicare & [employer]    Progress Note Due on Visit 17   Recert on visit #7 - 08/01/23   PT Start Time 1530    PT Stop Time 1616    PT Time Calculation (min) 46 min    Activity Tolerance Patient tolerated treatment well    Behavior During Therapy WFL for tasks assessed/performed           Past Medical History:  Diagnosis Date  . Allergy   . Anxiety    takes ativan as needed  . Benign positional vertigo   . Bursitis of shoulder, left   . Cancer [facility])    Endometrial ca/Recurrence  . CKD (chronic kidney disease), stage III   . History of colonic polyps   . Hypercholesteremia   . Hypercholesterolemia   . Hypertension   . Lumbar pain   . Osteoarthritis   . Osteoporosis   . S/P radiation therapy July 29, 2010-Sep 06, 2010   External beam of pelvis  . S/P radiation therapy 09/15/10, 09/24/10, 09/28/2010   Intracavitary brachytherapy  . SBO (small bowel obstruction) ([place])   . Status post chemotherapy    carboplatin/paclitaxel x 6 rounds    Past Surgical History:  Procedure Laterality Date  . ABDOMINAL HYSTERECTOMY    . BREAST LUMPECTOMY  1993   Left breast, benign  . ROBOTIC ASSISTED LAPAROSCOPIC VAGINAL HYSTERECTOMY WITH FIBROID REMOVAL  July 2010   bilat. salpingo-oophorectomy    There were no vitals filed for this visit.   Subjective Assessment -  10/31/19 1541    Subjective Pt noitng increased pain in the morning for the past 2 days but eases off with Tylenol and exercises.    Pertinent History HTN, endometrial cancer, anxiety, BPPV, L shoulder bursitis, CKD, OA, osteoporosis    Diagnostic tests R shoulder x-ray 09/10/19 - Unremarkable; MRI pending for 11/10/19    Patient Stated Goals "No pain"    Currently in Pain? 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    Leisure gardening      AROM   Cervical Flexion 64    Cervical  Extension 50    Cervical - Right Side Bend 26    Cervical - Left Side Bend 21    Cervical - Right Rotation 56 - pulling    Cervical - Left Rotation 58 - pulling      Strength   Right Shoulder Flexion 4/5    Right Shoulder ABduction 4/5    Right Shoulder Internal Rotation 4+/5    Right Shoulder External Rotation 4+/5    Left Shoulder Flexion 4+/5    Left Shoulder ABduction 4+/5    Left Shoulder Internal Rotation 4+/5    Left Shoulder External Rotation 4+/5                         [facility] Adult PT Treatment/Exercise - 10/31/19 1530      Neck Exercises: Machines for Strengthening   Nustep L2 x 6 min (UE/LE)    seat #8, arms #9     Manual Therapy   Manual Therapy Soft tissue mobilization;Myofascial release    Soft tissue mobilization STM/DTM to B UT & LS (R>L)    Myofascial Release manual TPR to R UT and LS                     PT Short Term Goals - 10/31/19 1549      PT SHORT TERM GOAL #1   Title Patient will be independent with initial HEP    Status Achieved   10/02/19     PT SHORT TERM GOAL #2   Title Patient will verbalize/demonstrate understanding of neutral spine and shoulder posture and proper body mechanics to reduce strain on cervical spine    Status Achieved   10/31/19            PT Long Term Goals - 10/31/19 1549      PT LONG TERM GOAL #1   Title Patient will be independent with ongoing/advanced HEP    Status Partially Met   10/31/19 - met for current HEP   Target Date 12/12/19      PT LONG TERM GOAL #2   Title Patient to demonstrate ability to achieve and maintain good spinal and shoulder alignment/posturing    Status Partially Met   10/31/19 - notes good awareness of desired posture but not always consistent   Target Date 12/12/19      PT LONG TERM GOAL #3   Title Patient to report pain reduction in frequency and intensity by >/= 50%    Status Achieved   10/14/19 - 75% improved     PT LONG TERM GOAL #4   Title Patient to improve cervical AROM to [facility] without pain provocation    Status On-going    Target Date 12/12/19      PT LONG TERM GOAL #5   Title Patient will demonstrate improved B shoulder strength to >/= 4+/5 for functional UE use    Status Partially Met    Target Date 12/12/19      PT LONG TERM GOAL #6   Title Patient to report ability to perform work and daily activities without pain provocation    Status Partially Met    Target Date 12/12/19                 Plan - 10/31/19 1545    Clinical Impression Statement [name] notes benefit from PT with decreasing frequency and intensity of pain and UE radiculopathy, improving postural awareness (although admits to still needing to work  at this) and improving UE strength (although R UE still feels weaker than L). Cervical ROM has improved in all planes except flexion which was WNL at baseline, although continued pulling/tightness  noted with most motions. Overall UE strength has improved by at least  MMT grade, with greatest weakness persisting in R shoulder flexion and abduction. She continues to work on postural awareness and has purchased an adjustable desk but has not been able to assemble it yet. All STGs now met with LTGs at least partially met. [name] will benefit from continued PT to further restore pain free cervical ROM to [facility] along with further postural training and strengthening, therefore will recommend recert for additional 2x/wk for 4-6 weeks.    Personal Factors and Comorbidities Age;Comorbidity 3+;Fitness;Past/Current Experience;Profession;Time since onset of injury/illness/exacerbation    Comorbidities HTN, endometrial cancer, anxiety, BPPV, L shoulder bursitis, CKD, OA, osteoporosis    Examination-Activity Limitations Sit;Reach Overhead;Lift;Carry    Examination-Participation Restrictions Cleaning;Community Activity;Driving;Laundry;Meal Prep    Rehab Potential Good    PT Frequency 2x / week    PT Duration 6 weeks   4-6 weeks   PT Treatment/Interventions ADLs/Self Care Home Management;Cryotherapy;Electrical Stimulation;Iontophoresis 46m/ml Dexamethasone;Moist Heat;Ultrasound;Functional mobility training;Therapeutic activities;Therapeutic exercise;Neuromuscular re-education;Patient/family education;Manual techniques;Passive range of motion;Dry needling;Taping;Spinal Manipulations;Joint Manipulations    PT Next Visit Plan Recert; postural flexibilty and strengthening; cervical ROM; manual therapy including possible DN to address pain, increased muscle tension and cervical ROM; modalities PRN; review of posture and body mechanics education PRN    PT Home Exercise Plan 5/28 - cervical retraction, UT and 3-way doorway pec stretches, scap retraction & depression; 6/21 - LS stretch, hooklying yellow TB scap retraction + B shoulder horiz ABD and shoulder ER    Consulted and Agree with Plan of Care Patient            Patient will benefit from skilled therapeutic intervention in order to improve the following deficits and impairments:  Decreased activity tolerance, Decreased knowledge of precautions, Decreased range of motion, Decreased strength, Hypomobility, Increased fascial restricitons, Increased muscle spasms, Impaired perceived functional ability, Impaired flexibility, Impaired UE functional use, Improper body mechanics, Postural dysfunction, Pain  Visit Diagnosis: Radiculopathy, cervicothoracic region  Abnormal posture  Other muscle spasm  Acute pain of right shoulder  Muscle weakness (generalized)     Problem List Patient Active Problem List   Diagnosis Date Noted  . Acute pain of right shoulder 09/30/2019  . Myofascial pain 09/18/2019  . Cervical radiculopathy 09/12/2019  . Essential hypertension, benign 05/18/2013  . [place]) [place] 05[place]17[place]2013  . Small bowel obstruction ([place] 05/16/2013  . Malignant neoplasm of corpus uteri, except isthmus ([place] 05/16/2011    JPercival Spanish PT, MPT 10/31/2019, 7:12 PM  [facility] [address]Phone: [phone]  Fax:  [phone] Name: Jessica LindonMRN: [id number] of Birth: [birth year]

## 2019-11-06 ENCOUNTER — Ambulatory Visit: Payer: Medicare Other | Admitting: Physical Therapy

## 2019-11-06 ENCOUNTER — Encounter: Payer: Self-pay | Admitting: Physical Therapy

## 2019-11-06 ENCOUNTER — Other Ambulatory Visit: Payer: Self-pay

## 2019-11-06 DIAGNOSIS — M25511 Pain in right shoulder: Secondary | ICD-10-CM | POA: Diagnosis not present

## 2019-11-06 DIAGNOSIS — M5413 Radiculopathy, cervicothoracic region: Secondary | ICD-10-CM

## 2019-11-06 DIAGNOSIS — M62838 Other muscle spasm: Secondary | ICD-10-CM | POA: Diagnosis not present

## 2019-11-06 DIAGNOSIS — M6281 Muscle weakness (generalized): Secondary | ICD-10-CM

## 2019-11-06 DIAGNOSIS — R293 Abnormal posture: Secondary | ICD-10-CM

## 2019-11-06 NOTE — Therapy (Signed)
[facility] [address]  [place], [place], [address] Phone: [phone]   Fax:  [phone]  Physical Therapy Treatment  Patient Details  Name: [name] MRN: [id number] Date of Birth: November 01, 1940 Referring Provider (PT): Clearance Coots, MD   Encounter Date: 11/06/2019   PT End of Session - 11/06/19 0935    Visit Number 8    Number of Visits 19    Date for PT Re-Evaluation 12/12/19    Authorization Type Medicare & [employer]    Progress Note Due on Visit 17   Recert on visit #7 - 0/9/32   PT Start Time 0935    PT Stop Time 1029    PT Time Calculation (min) 54 min    Activity Tolerance Patient tolerated treatment well    Behavior During Therapy [facility] for tasks assessed/performed           Past Medical History:  Diagnosis Date  . Allergy   . Anxiety    takes ativan as needed  . Benign positional vertigo   . Bursitis of shoulder, left   . Cancer [facility])    Endometrial ca/Recurrence  . CKD (chronic kidney disease), stage III   . History of colonic polyps   . Hypercholesteremia   . Hypercholesterolemia   . Hypertension   . Lumbar pain   . Osteoarthritis   . Osteoporosis   . S/P radiation therapy July 29, 2010-Sep 06, 2010   External beam of pelvis  . S/P radiation therapy 09/15/10, 09/24/10, 09/28/2010   Intracavitary brachytherapy  . SBO (small bowel obstruction) ([place])   . Status post chemotherapy    carboplatin/paclitaxel x 6 rounds    Past Surgical History:  Procedure Laterality Date  . ABDOMINAL HYSTERECTOMY    . BREAST LUMPECTOMY  1993   Left breast, benign  . ROBOTIC ASSISTED LAPAROSCOPIC VAGINAL HYSTERECTOMY WITH FIBROID REMOVAL  July 2010   bilat. salpingo-oophorectomy    There were no vitals filed for this visit.   Subjective Assessment - 11/06/19 0939    Subjective Pt reporting pain up to 8/10 earlier this morning while driving. Pt expressing interest in trying DN today.     Pertinent History HTN, endometrial cancer, anxiety, BPPV, L shoulder bursitis, CKD, OA, osteoporosis    Diagnostic tests R shoulder x-ray 09/10/19 - Unremarkable; MRI pending for 11/10/19    Patient Stated Goals "No pain"    Currently in Pain? Yes    Pain Score 5     Pain Location Neck   & upper shoulder   Pain Orientation Right;Lower    Pain Descriptors / Indicators Dull;Aching    Pain Type Chronic pain                             [facility] Adult PT Treatment/Exercise - 11/06/19 0935      Exercises   Exercises Neck      Neck Exercises: Machines for Strengthening   Nustep L2 x 6 min (UE/LE)    seat #8, arms #9     Modalities   Modalities Electrical Stimulation;Moist Heat      Moist Heat Therapy   Number Minutes Moist Heat 12 Minutes    Moist Heat Location Cervical;Shoulder      Electrical Stimulation   Electrical Stimulation Location R upper shoulder    Electrical Stimulation Action IFC    Electrical Stimulation Parameters 80-150 Hz, intensity to pt tol x  12'    Electrical Stimulation Goals Pain;Tone      Manual Therapy   Manual Therapy Soft tissue mobilization;Myofascial release;Passive ROM    Manual therapy comments skilled palpation and monitoring during DN    Soft tissue mobilization STM/DTM to B UT and LS (R>L)    Myofascial Release manual TPR to R UT and LS; pin and stretch to R UT and LS    Passive ROM manual R UT & LS stretches; gentle cervical PROM within pain tolerance; gentle overpressure into scap retraction & depression over towel roll            Trigger Point Dry Needling - 11/06/19 0935    Consent Given? Yes    Education Handout Provided Previously provided    Muscles Treated Head and Neck Upper trapezius;Levator scapulae   Rt   Upper Trapezius Response Twitch reponse elicited;Palpable increased muscle length    Levator Scapulae Response Twitch response elicited;Palpable increased muscle length                PT Education -  11/06/19 0945    Education Details Role of DN, expected response to treatment and recommended post-treatment activity; Role of estim    Person(s) Educated Patient    Methods Explanation    Comprehension Verbalized understanding            PT Short Term Goals - 10/31/19 1549      PT SHORT TERM GOAL #1   Title Patient will be independent with initial HEP    Status Achieved   10/02/19     PT SHORT TERM GOAL #2   Title Patient will verbalize/demonstrate understanding of neutral spine and shoulder posture and proper body mechanics to reduce strain on cervical spine    Status Achieved   10/31/19            PT Long Term Goals - 10/31/19 1549      PT LONG TERM GOAL #1   Title Patient will be independent with ongoing/advanced HEP    Status Partially Met   10/31/19 - met for current HEP   Target Date 12/12/19      PT LONG TERM GOAL #2   Title Patient to demonstrate ability to achieve and maintain good spinal and shoulder alignment/posturing    Status Partially Met   10/31/19 - notes good awareness of desired posture but not always consistent   Target Date 12/12/19      PT LONG TERM GOAL #3   Title Patient to report pain reduction in frequency and intensity by >/= 50%    Status Achieved   10/14/19 - 75% improved     PT LONG TERM GOAL #4   Title Patient to improve cervical AROM to [facility] without pain provocation    Status On-going    Target Date 12/12/19      PT LONG TERM GOAL #5   Title Patient will demonstrate improved B shoulder strength to >/= 4+/5 for functional UE use    Status Partially Met    Target Date 12/12/19      PT LONG TERM GOAL #6   Title Patient to report ability to perform work and daily activities without pain provocation    Status Partially Met    Target Date 12/12/19                 Plan - 11/06/19 0942    Clinical Impression Statement [name] continues to report variable pain in R neck and upper shoulder  increasing to 8/10 while driving this morning.  Increased muscle tension and tightness persist in R UT and LS with ongoing fwd head and rounded shoulder posture resulting in continued muscle strain. Pt expressing interest in trying DN in conjunction with manual therapy to see if we are able to get more long-lasting relief from manual therapy. DN performed to R UT and LS in prone but pt having difficulty finding comfortable positioning for R UE, therefore pt transitioned to supine for remaining manual therapy and will try DN to UT in supine if DN performed on future visits. Session concluded with estim and moist heat to promote further muscle relaxation and pain relief.    Personal Factors and Comorbidities Age;Comorbidity 3+;Fitness;Past/Current Experience;Profession;Time since onset of injury/illness/exacerbation    Comorbidities HTN, endometrial cancer, anxiety, BPPV, L shoulder bursitis, CKD, OA, osteoporosis    Examination-Activity Limitations Sit;Reach Overhead;Lift;Carry    Examination-Participation Restrictions Cleaning;Community Activity;Driving;Laundry;Meal Prep    Rehab Potential Good    PT Frequency 2x / week    PT Duration 6 weeks   4-6 weeks   PT Treatment/Interventions ADLs/Self Care Home Management;Cryotherapy;Electrical Stimulation;Iontophoresis 7m/ml Dexamethasone;Moist Heat;Ultrasound;Functional mobility training;Therapeutic activities;Therapeutic exercise;Neuromuscular re-education;Patient/family education;Manual techniques;Passive range of motion;Dry needling;Taping;Spinal Manipulations;Joint Manipulations    PT Next Visit Plan assess response to DN; postural flexibilty and strengthening; cervical ROM; manual therapy including possible DN to address pain, increased muscle tension and cervical ROM; modalities PRN; review of posture and body mechanics education PRN    PT Home Exercise Plan 5/28 - cervical retraction, UT and 3-way doorway pec stretches, scap retraction & depression; 6/21 - LS stretch, hooklying yellow TB scap  retraction + B shoulder horiz ABD and shoulder ER    Consulted and Agree with Plan of Care Patient           Patient will benefit from skilled therapeutic intervention in order to improve the following deficits and impairments:  Decreased activity tolerance, Decreased knowledge of precautions, Decreased range of motion, Decreased strength, Hypomobility, Increased fascial restricitons, Increased muscle spasms, Impaired perceived functional ability, Impaired flexibility, Impaired UE functional use, Improper body mechanics, Postural dysfunction, Pain  Visit Diagnosis: Radiculopathy, cervicothoracic region  Abnormal posture  Other muscle spasm  Acute pain of right shoulder  Muscle weakness (generalized)     Problem List Patient Active Problem List   Diagnosis Date Noted  . Acute pain of right shoulder 09/30/2019  . Myofascial pain 09/18/2019  . Cervical radiculopathy 09/12/2019  . Essential hypertension, benign 05/18/2013  . [place]) ([place] 05/17[place]2013  . Small bowel obstruction ([place] 05/16/2013  . Malignant neoplasm of corpus uteri, except isthmus ([place] 05/16/2011    JPercival Spanish PT, MPT  11/06/2019, 11:44 AM  [facility] [place] [address]Phone: [phone]  Fax:  [phone] Name: Jessica GorenMRN: [id number] of Birth: 8April 14, 1942

## 2019-11-10 ENCOUNTER — Ambulatory Visit
Admission: RE | Admit: 2019-11-10 | Discharge: 2019-11-10 | Disposition: A | Discharge status: Home / Self Care | Payer: Medicare Other | Source: Ambulatory Visit | Attending: Family Medicine | Admitting: Family Medicine

## 2019-11-10 DIAGNOSIS — M4802 Spinal stenosis, cervical region: Secondary | ICD-10-CM | POA: Diagnosis not present

## 2019-11-10 DIAGNOSIS — M542 Cervicalgia: Secondary | ICD-10-CM

## 2019-11-11 ENCOUNTER — Ambulatory Visit: Payer: Medicare Other

## 2019-11-13 ENCOUNTER — Encounter: Payer: Medicare Other | Admitting: Physical Therapy

## 2019-11-18 ENCOUNTER — Other Ambulatory Visit: Payer: Self-pay

## 2019-11-18 ENCOUNTER — Ambulatory Visit: Payer: Medicare Other

## 2019-11-18 DIAGNOSIS — M62838 Other muscle spasm: Secondary | ICD-10-CM

## 2019-11-18 DIAGNOSIS — R293 Abnormal posture: Secondary | ICD-10-CM

## 2019-11-18 DIAGNOSIS — M5413 Radiculopathy, cervicothoracic region: Secondary | ICD-10-CM

## 2019-11-18 DIAGNOSIS — M6281 Muscle weakness (generalized): Secondary | ICD-10-CM

## 2019-11-18 DIAGNOSIS — M25511 Pain in right shoulder: Secondary | ICD-10-CM

## 2019-11-18 NOTE — Therapy (Addendum)
[facility] [address]  [place], [place], [address] Phone: [phone]   Fax:  [phone]  Physical Therapy Treatment  Patient Details  Name: [name] MRN: [id number] Date of Birth: 1941/01/02 Referring Provider (PT): Clearance Coots, MD   Encounter Date: 11/18/2019   PT End of Session - 11/18/19 1124    Visit Number 9    Number of Visits 19    Date for PT Re-Evaluation 12/12/19    Authorization Type Medicare & [employer]    Progress Note Due on Visit 17   Recert on visit #7 - 06/30/16   PT Start Time 1107    PT Stop Time 1210    PT Time Calculation (min) 63 min    Activity Tolerance Patient tolerated treatment well    Behavior During Therapy [facility] for tasks assessed/performed           Past Medical History:  Diagnosis Date  . Allergy   . Anxiety    takes ativan as needed  . Benign positional vertigo   . Bursitis of shoulder, left   . Cancer [facility])    Endometrial ca/Recurrence  . CKD (chronic kidney disease), stage III   . History of colonic polyps   . Hypercholesteremia   . Hypercholesterolemia   . Hypertension   . Lumbar pain   . Osteoarthritis   . Osteoporosis   . S/P radiation therapy July 29, 2010-Sep 06, 2010   External beam of pelvis  . S/P radiation therapy 09/15/10, 09/24/10, 09/28/2010   Intracavitary brachytherapy  . SBO (small bowel obstruction) ([place])   . Status post chemotherapy    carboplatin/paclitaxel x 6 rounds    Past Surgical History:  Procedure Laterality Date  . ABDOMINAL HYSTERECTOMY    . BREAST LUMPECTOMY  1993   Left breast, benign  . ROBOTIC ASSISTED LAPAROSCOPIC VAGINAL HYSTERECTOMY WITH FIBROID REMOVAL  July 2010   bilat. salpingo-oophorectomy    There were no vitals filed for this visit.   Subjective Assessment - 11/18/19 1115    Subjective felt that DN helped.    Pertinent History HTN, endometrial cancer, anxiety, BPPV, L shoulder bursitis, CKD, OA,  osteoporosis    Diagnostic tests R shoulder x-ray 09/10/19 - Unremarkable; MRI pending for 11/10/19    Patient Stated Goals "No pain"    Currently in Pain? 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 tolerance, 10'    Electrical Stimulation Goals Pain;Tone      Manual Therapy   Manual Therapy Soft tissue mobilization;Myofascial release    Manual therapy comments sitting     Soft tissue mobilization DTM to B UT, LS, cervical paraspinals     Myofascial Release Manual TPR to R UT,                    PT Short Term Goals - 10/31/19 1549      PT SHORT TERM GOAL #1   Title Patient will be independent with initial HEP    Status Achieved   10/02/19     PT SHORT TERM GOAL #2   Title Patient will verbalize/demonstrate understanding of neutral spine and shoulder posture and proper body mechanics to reduce strain on cervical spine     Status Achieved   10/31/19            PT Long Term Goals - 10/31/19 1549      PT LONG TERM GOAL #1   Title Patient will be independent with ongoing/advanced HEP    Status Partially Met   10/31/19 - met for current HEP   Target Date 12/12/19      PT LONG TERM GOAL #2   Title Patient to demonstrate ability to achieve and maintain good spinal and shoulder alignment/posturing    Status Partially Met   10/31/19 - notes good awareness of desired posture but not always consistent   Target Date 12/12/19      PT LONG TERM GOAL #3   Title Patient to report pain reduction in frequency and intensity by >/= 50%    Status Achieved   10/14/19 - 75% improved     PT LONG TERM GOAL #4   Title Patient to improve cervical AROM to [facility] without pain provocation    Status On-going    Target Date 12/12/19      PT LONG TERM GOAL #5   Title Patient will demonstrate improved B shoulder strength to >/= 4+/5 for functional UE use    Status Partially Met    Target Date 12/12/19      PT LONG TERM GOAL #6   Title Patient to report ability to perform work and daily activities without pain provocation    Status Partially Met    Target Date 12/12/19                 Plan - 11/18/19 1206    Clinical Impression Statement [name] reporting benefit from DN from last session and would like to have this service again in future visits.  Just rode in car back from beach yesterday and has been feeling increased R-sided neck/R UE pain since then.  Postural strengthening therex focused on scapular strength and mobility with pt. demonstrating improved scapular mobility with therapist tactile facilitation.  MT focused on improving tissue quality in R-sided UT, LS, cervical paraspinals which had increased tension and some improvement following MT.  Ended session with E-stim/moist heat to cervical/upper shoulders for relief of tone and pain.  Pt. noting after 5 min weight of moist heat packs irritating R upper shoulder in  sitting thus removed heat packs  with relief following.  Will consider further DN in coming sessions.    Comorbidities HTN, endometrial cancer, anxiety, BPPV, L shoulder bursitis, CKD, OA, osteoporosis    Rehab Potential Good    PT Treatment/Interventions ADLs/Self Care Home Management;Cryotherapy;Electrical Stimulation;Iontophoresis 34m/ml Dexamethasone;Moist Heat;Ultrasound;Functional mobility training;Therapeutic activities;Therapeutic exercise;Neuromuscular re-education;Patient/family education;Manual techniques;Passive range of motion;Dry needling;Taping;Spinal Manipulations;Joint Manipulations    PT Next Visit Plan Postural flexibilty and strengthening; cervical ROM; manual therapy including possible DN to address pain, increased muscle tension and cervical ROM; modalities PRN; review of posture and body mechanics education PRN    PT Home Exercise Plan 5/28 - cervical retraction, UT and 3-way doorway pec stretches, scap retraction & depression; 6/21 - LS stretch, hooklying yellow TB scap retraction + B shoulder horiz ABD and shoulder ER    Consulted and Agree with Plan of Care Patient           Patient will benefit from skilled therapeutic intervention in order to improve the following deficits and impairments:  Decreased activity tolerance, Decreased knowledge of precautions, Decreased range of motion, Decreased strength, Hypomobility, Increased fascial restricitons, Increased muscle spasms, Impaired perceived functional ability, Impaired flexibility, Impaired UE functional use, Improper body mechanics, Postural dysfunction, Pain  Visit Diagnosis: Radiculopathy, cervicothoracic region  Abnormal posture  Other muscle spasm  Acute pain of right shoulder  Muscle weakness (generalized)     Problem List Patient Active Problem List   Diagnosis Date Noted  . Acute pain of right shoulder 09/30/2019  . Myofascial pain 09/18/2019  . Cervical radiculopathy 09/12/2019  . Essential  hypertension, benign 05/18/2013  . S[place] ([place]) ([place] 05/17/2013  . Small bowel obstruction ([place] 05/16/2013  . Malignant neoplasm of corpus uteri, except isthmus ([place] 05/16/2011    MBess Harvest PTA 11/18/19 12:18 PM   [facility] [address] [place] [address]Phone: [phone]  Fax:  [phone] Name: DKeilany BurnetteMRN: [id number] of Birth: [birth year]

## 2019-11-20 ENCOUNTER — Encounter: Payer: Medicare Other | Admitting: Physical Therapy

## 2019-11-22 ENCOUNTER — Other Ambulatory Visit: Payer: Self-pay

## 2019-11-22 ENCOUNTER — Ambulatory Visit: Payer: Medicare Other | Admitting: Physical Therapy

## 2019-11-22 ENCOUNTER — Encounter: Payer: Self-pay | Admitting: Physical Therapy

## 2019-11-22 DIAGNOSIS — M62838 Other muscle spasm: Secondary | ICD-10-CM

## 2019-11-22 DIAGNOSIS — M25511 Pain in right shoulder: Secondary | ICD-10-CM | POA: Diagnosis not present

## 2019-11-22 DIAGNOSIS — M6281 Muscle weakness (generalized): Secondary | ICD-10-CM | POA: Diagnosis not present

## 2019-11-22 DIAGNOSIS — M5413 Radiculopathy, cervicothoracic region: Secondary | ICD-10-CM

## 2019-11-22 DIAGNOSIS — R293 Abnormal posture: Secondary | ICD-10-CM

## 2019-11-22 NOTE — Therapy (Signed)
[facility] [address]  [place], [place], [address] Phone: [phone]   Fax:  [phone]  Physical Therapy Treatment  Patient Details  Name: [name] MRN: [id number] Date of Birth: [birth year] Referring Provider (PT): Clearance Coots, MD   Encounter Date: 11/22/2019   PT End of Session - 11/22/19 0932    Visit Number 10    Number of Visits 19    Date for PT Re-Evaluation 12/12/19    Authorization Type Medicare & [employer]    Progress Note Due on Visit 17   Recert on visit #7 - 12/27/05   PT Start Time 0932    PT Stop Time 1030    PT Time Calculation (min) 58 min    Activity Tolerance Patient tolerated treatment well    Behavior During Therapy [facility] for tasks assessed/performed           Past Medical History:  Diagnosis Date  . Allergy   . Anxiety    takes ativan as needed  . Benign positional vertigo   . Bursitis of shoulder, left   . Cancer [facility])    Endometrial ca/Recurrence  . CKD (chronic kidney disease), stage III   . History of colonic polyps   . Hypercholesteremia   . Hypercholesterolemia   . Hypertension   . Lumbar pain   . Osteoarthritis   . Osteoporosis   . S/P radiation therapy July 29, 2010-Sep 06, 2010   External beam of pelvis  . S/P radiation therapy 09/15/10, 09/24/10, 09/28/2010   Intracavitary brachytherapy  . SBO (small bowel obstruction) ([place])   . Status post chemotherapy    carboplatin/paclitaxel x 6 rounds    Past Surgical History:  Procedure Laterality Date  . ABDOMINAL HYSTERECTOMY    . BREAST LUMPECTOMY  1993   Left breast, benign  . ROBOTIC ASSISTED LAPAROSCOPIC VAGINAL HYSTERECTOMY WITH FIBROID REMOVAL  July 2010   bilat. salpingo-oophorectomy    There were no vitals filed for this visit.   Subjective Assessment - 11/22/19 0935    Subjective Pt reports her pain is unpredictable - she can do the same routine with some days having no pain and days like today  where she has more pain. She also notes intermittent R elbow pain and numbness in tip of pinky finger. Pt reports she has a virtual visit with MD on Mon 8/2 to discuss MRI findings.    Pertinent History HTN, endometrial cancer, anxiety, BPPV, L shoulder bursitis, CKD, OA, osteoporosis    Diagnostic tests Cervical MRI 11/10/19: Multilevel spondylosis. Multilevel foraminal encroachment due to spurring throughout the cervical spine. Right foraminal disc protrusion at C7-T1 with right C8 nerve root impingement.   R shoulder x-ray 09/10/19 - Unremarkable    Patient Stated Goals "No pain"    Currently in Pain? Yes    Pain Score 6    5-6/10   Pain Location Neck   & upper shoulder   Pain Orientation Lower;Right    Pain Descriptors / Indicators Other (Comment)   "aggravating"   Pain Type Chronic pain    Pain Frequency Intermittent    Aggravating Factors  driving at times    Pain Relieving Factors hot shower, Aleve, less pain when moving more                             [facility] Adult PT Treatment/Exercise - 11/22/19 6808  Exercises   Exercises Neck      Neck Exercises: Machines for Strengthening   UBE (Upper Arm Bike) L1.0 x 6' (3' fwd/3' back)      Modalities   Modalities Electrical Stimulation;Moist Heat      Moist Heat Therapy   Number Minutes Moist Heat 15 Minutes    Moist Heat Location Cervical;Shoulder      Electrical Stimulation   Electrical Stimulation Location B upper shoulders    Electrical Stimulation Action IFC    Electrical Stimulation Parameters 80-150 Hz, intensity to pt tol x 15'    Electrical Stimulation Goals Pain;Tone      Manual Therapy   Manual Therapy Soft tissue mobilization;Myofascial release;Passive ROM    Manual therapy comments skilled palpation and monitoring during DN    Soft tissue mobilization STM/DTM to B UT, LS and pecs (R>L)    Myofascial Release manual TPR to R UT and LS; pin and stretch to R UT and LS    Passive ROM manual R UT &  LS stretches; gentle cervical PROM within pain tolerance; gentle overpressure into scap retraction & depression for pec stretch            Trigger Point Dry Needling - 11/22/19 0932    Consent Given? Yes    Muscles Treated Head and Neck Upper trapezius;Levator scapulae   Rt   Muscles Treated Upper Quadrant Pectoralis major   Rt   Upper Trapezius Response Twitch reponse elicited;Palpable increased muscle length    Levator Scapulae Response Twitch response elicited;Palpable increased muscle length    Pectoralis Major Response Twitch response elicited;Palpable increased muscle length                  PT Short Term Goals - 10/31/19 1549      PT SHORT TERM GOAL #1   Title Patient will be independent with initial HEP    Status Achieved   10/02/19     PT SHORT TERM GOAL #2   Title Patient will verbalize/demonstrate understanding of neutral spine and shoulder posture and proper body mechanics to reduce strain on cervical spine    Status Achieved   10/31/19            PT Long Term Goals - 10/31/19 1549      PT LONG TERM GOAL #1   Title Patient will be independent with ongoing/advanced HEP    Status Partially Met   10/31/19 - met for current HEP   Target Date 12/12/19      PT LONG TERM GOAL #2   Title Patient to demonstrate ability to achieve and maintain good spinal and shoulder alignment/posturing    Status Partially Met   10/31/19 - notes good awareness of desired posture but not always consistent   Target Date 12/12/19      PT LONG TERM GOAL #3   Title Patient to report pain reduction in frequency and intensity by >/= 50%    Status Achieved   10/14/19 - 75% improved     PT LONG TERM GOAL #4   Title Patient to improve cervical AROM to [facility] without pain provocation    Status On-going    Target Date 12/12/19      PT LONG TERM GOAL #5   Title Patient will demonstrate improved B shoulder strength to >/= 4+/5 for functional UE use    Status Partially Met    Target Date  12/12/19      PT LONG TERM GOAL #6  Title Patient to report ability to perform work and daily activities without pain provocation    Status Partially Met    Target Date 12/12/19                 Plan - 11/22/19 1030    Clinical Impression Statement [name] reports pain still unpredictable - some days with no pain and some days with increased pain despite same daily routine. Pain increased today without known trigger, mostly localized to R side of neck and upper R shoulder, although also noting intermittent R elbow pain and numbness in tip of R 5th digit. Pt noting benefit from prior DN and requesting this again today. Manual therapy addressing B neck and upper shoulder with increased muscle tension and ttp R>L with DN targeting TPs and taut bands in R UT, LS, and pecs - strong twitch responses in R UT and LS with some soreness noted following DN but palpable reduction in muscle tension noted following MT, DN and manual stretching. Session concluded with estim and moist heat to promote further reduction in pain and muscle tension.    Comorbidities HTN, endometrial cancer, anxiety, BPPV, L shoulder bursitis, CKD, OA, osteoporosis    Rehab Potential Good    PT Frequency 2x / week    PT Duration 6 weeks    PT Treatment/Interventions ADLs/Self Care Home Management;Cryotherapy;Electrical Stimulation;Iontophoresis 80m/ml Dexamethasone;Moist Heat;Ultrasound;Functional mobility training;Therapeutic activities;Therapeutic exercise;Neuromuscular re-education;Patient/family education;Manual techniques;Passive range of motion;Dry needling;Taping;Spinal Manipulations;Joint Manipulations    PT Next Visit Plan Postural flexibilty and strengthening; cervical ROM; manual therapy including possible DN to address pain, increased muscle tension and cervical ROM; modalities PRN; review of posture and body mechanics education PRN    PT Home Exercise Plan 5/28 - cervical retraction, UT and 3-way doorway pec stretches,  scap retraction & depression; 6/21 - LS stretch, hooklying yellow TB scap retraction + B shoulder horiz ABD and shoulder ER    Consulted and Agree with Plan of Care Patient           Patient will benefit from skilled therapeutic intervention in order to improve the following deficits and impairments:  Decreased activity tolerance, Decreased knowledge of precautions, Decreased range of motion, Decreased strength, Hypomobility, Increased fascial restricitons, Increased muscle spasms, Impaired perceived functional ability, Impaired flexibility, Impaired UE functional use, Improper body mechanics, Postural dysfunction, Pain  Visit Diagnosis: Radiculopathy, cervicothoracic region  Abnormal posture  Other muscle spasm  Acute pain of right shoulder  Muscle weakness (generalized)     Problem List Patient Active Problem List   Diagnosis Date Noted  . Acute pain of right shoulder 09/30/2019  . Myofascial pain 09/18/2019  . Cervical radiculopathy 09/12/2019  . Essential hypertension, benign 05/18/2013  . [place]) ([place]  . [place] bowel [place] ([place] 05/16/2013  . Malignant neoplasm of corpus uteri, except isthmus ([place] 05/16/2011    JPercival Spanish PT, MPT 11/22/2019, 11:36 AM  [facility] [place] [address]Phone: [phone]  Fax:  [phone] Name: DRazan SilerMRN: [id number] of Birth: [birth year]

## 2019-11-25 ENCOUNTER — Telehealth (INDEPENDENT_AMBULATORY_CARE_PROVIDER_SITE_OTHER): Payer: Medicare Other | Admitting: Family Medicine

## 2019-11-25 ENCOUNTER — Other Ambulatory Visit: Payer: Self-pay

## 2019-11-25 ENCOUNTER — Ambulatory Visit: Payer: Medicare Other | Attending: Family Medicine

## 2019-11-25 DIAGNOSIS — M62838 Other muscle spasm: Secondary | ICD-10-CM | POA: Diagnosis not present

## 2019-11-25 DIAGNOSIS — M5412 Radiculopathy, cervical region: Secondary | ICD-10-CM | POA: Diagnosis not present

## 2019-11-25 DIAGNOSIS — M25511 Pain in right shoulder: Secondary | ICD-10-CM

## 2019-11-25 DIAGNOSIS — M5413 Radiculopathy, cervicothoracic region: Secondary | ICD-10-CM | POA: Diagnosis not present

## 2019-11-25 DIAGNOSIS — M6281 Muscle weakness (generalized): Secondary | ICD-10-CM | POA: Diagnosis not present

## 2019-11-25 DIAGNOSIS — R293 Abnormal posture: Secondary | ICD-10-CM | POA: Insufficient documentation

## 2019-11-25 NOTE — Therapy (Signed)
[facility] [address]  [place], [place], [address] Phone: [phone]   Fax:  [phone]  Physical Therapy Treatment  Patient Details  Name: [name] MRN: [id number] Date of Birth: 1940/11/23 Referring Provider (PT): Clearance Coots, MD   Encounter Date: 11/25/2019   PT End of Session - 11/25/19 1032    Visit Number 11    Number of Visits 19    Date for PT Re-Evaluation 12/12/19    Authorization Type Medicare & [employer]    Progress Note Due on Visit 17   Recert on visit #7 - 11/01/87   PT Start Time 1021    PT Stop Time 1110    PT Time Calculation (min) 49 min    Activity Tolerance Patient tolerated treatment well    Behavior During Therapy [facility] for tasks assessed/performed           Past Medical History:  Diagnosis Date  . Allergy   . Anxiety    takes ativan as needed  . Benign positional vertigo   . Bursitis of shoulder, left   . Cancer [facility])    Endometrial ca/Recurrence  . CKD (chronic kidney disease), stage III   . History of colonic polyps   . Hypercholesteremia   . Hypercholesterolemia   . Hypertension   . Lumbar pain   . Osteoarthritis   . Osteoporosis   . S/P radiation therapy July 29, 2010-Sep 06, 2010   External beam of pelvis  . S/P radiation therapy 09/15/10, 09/24/10, 09/28/2010   Intracavitary brachytherapy  . SBO (small bowel obstruction) ([place])   . Status post chemotherapy    carboplatin/paclitaxel x 6 rounds    Past Surgical History:  Procedure Laterality Date  . ABDOMINAL HYSTERECTOMY    . BREAST LUMPECTOMY  1993   Left breast, benign  . ROBOTIC ASSISTED LAPAROSCOPIC VAGINAL HYSTERECTOMY WITH FIBROID REMOVAL  July 2010   bilat. salpingo-oophorectomy    There were no vitals filed for this visit.   Subjective Assessment - 11/25/19 1031    Subjective Pt. doing well.  Notes some improvement after initial DN soreness after a few days.    Pertinent History HTN,  endometrial cancer, anxiety, BPPV, L shoulder bursitis, CKD, OA, osteoporosis    Diagnostic tests Cervical MRI 11/10/19: Multilevel spondylosis. Multilevel foraminal encroachment due to spurring throughout the cervical spine. Right foraminal disc protrusion at C7-T1 with right C8 nerve root impingement.   R shoulder x-ray 09/10/19 - Unremarkable    Patient Stated Goals "No pain"    Currently in Pain? Yes    Pain Score 5     Pain Location Neck    Pain Orientation Lower;Right    Pain Type Chronic pain    Pain Radiating Towards intermittent down back and lateral R arm to elbows and wrist    Pain Onset More than a month ago    Pain Frequency Intermittent              [facility] PT Assessment - 11/25/19 0001      Assessment   Medical Diagnosis Cervical radiculopathy, R shoulder pain    Referring Provider (PT) Clearance Coots, MD    Next MD Visit 12/19/19                         [facility] Adult PT Treatment/Exercise - 11/25/19 0001      Self-Care   Self-Care Other Self-Care Comments;Posture  Posture further discussion on proper desk positioning to cervical strain with work tasks     Other Self-Care Comments  Instruction in Designer, fashion/clothing with laundry and daily activities to reduce cervical strain; encouraged pt. to walk outdoors or inside home frequently throughout workday for scapular/cervical motion + reduction in muscular tension; walked in hallway briefly with patient to demo "big arm swinging walk" for scapular motion       Neck Exercises: Machines for Strengthening   UBE (Upper Arm Bike) L1.0 x 6' (3' fwd/3' back)      Neck Exercises: Theraband   Rows 15 reps    Rows Limitations 3" hold; yellow TB       Neck Exercises: Seated   Neck Retraction 5 secs;15 reps    Neck Retraction Limitations Therapist overpressure into scapular retraction seated in chair + patient chin tuck       Manual Therapy   Manual Therapy Soft tissue mobilization;Myofascial release    Manual  therapy comments seated     Soft tissue mobilization DTM to B UT, LS (R>L), scalenes     Myofascial Release Manual TPR to B UT (R>L)      Neck Exercises: Stretches   Upper Trapezius Stretch Right;Left;1 rep;30 seconds    Upper Trapezius Stretch Limitations hands anchored behind back     Levator Stretch Right;Left;1 rep;30 seconds    Levator Stretch Limitations hands anchored behind back                     PT Short Term Goals - 10/31/19 1549      PT SHORT TERM GOAL #1   Title Patient will be independent with initial HEP    Status Achieved   10/02/19     PT SHORT TERM GOAL #2   Title Patient will verbalize/demonstrate understanding of neutral spine and shoulder posture and proper body mechanics to reduce strain on cervical spine    Status Achieved   10/31/19            PT Long Term Goals - 10/31/19 1549      PT LONG TERM GOAL #1   Title Patient will be independent with ongoing/advanced HEP    Status Partially Met   10/31/19 - met for current HEP   Target Date 12/12/19      PT LONG TERM GOAL #2   Title Patient to demonstrate ability to achieve and maintain good spinal and shoulder alignment/posturing    Status Partially Met   10/31/19 - notes good awareness of desired posture but not always consistent   Target Date 12/12/19      PT LONG TERM GOAL #3   Title Patient to report pain reduction in frequency and intensity by >/= 50%    Status Achieved   10/14/19 - 75% improved     PT LONG TERM GOAL #4   Title Patient to improve cervical AROM to [facility] without pain provocation    Status On-going    Target Date 12/12/19      PT LONG TERM GOAL #5   Title Patient will demonstrate improved B shoulder strength to >/= 4+/5 for functional UE use    Status Partially Met    Target Date 12/12/19      PT LONG TERM GOAL #6   Title Patient to report ability to perform work and daily activities without pain provocation    Status Partially Met    Target Date 12/12/19  Plan - 11/25/19 1034    Clinical Impression Statement [name] noting increased soreness however improved muscular tension in following days after DN.  Session focused on instruction in proper desk positioning with work and body mechanics with daily activities to reduce cervical strain.  Patient has purchased a new table and made a few alterations to her positioning including taking frequent walking/stretch breaks throughout workday which has helped to reduce her pain.  Does still note intermittent R UE radicular pain in session and at home.  Spoke with MD over the phone regarding results of MRI showing Right foraminal disc protrusion at C7-T1 with right C8 nerve root impingement.  Patient notes she wishes to continue with physical therapy and has had measurable relief since starting PT.    Comorbidities HTN, endometrial cancer, anxiety, BPPV, L shoulder bursitis, CKD, OA, osteoporosis    Rehab Potential Good    PT Frequency 2x / week    PT Duration 6 weeks    PT Treatment/Interventions ADLs/Self Care Home Management;Cryotherapy;Electrical Stimulation;Iontophoresis 68m/ml Dexamethasone;Moist Heat;Ultrasound;Functional mobility training;Therapeutic activities;Therapeutic exercise;Neuromuscular re-education;Patient/family education;Manual techniques;Passive range of motion;Dry needling;Taping;Spinal Manipulations;Joint Manipulations    PT Next Visit Plan Postural flexibilty and strengthening; cervical ROM; manual therapy including possible DN to address pain, increased muscle tension and cervical ROM; modalities PRN; review of posture and body mechanics education PRN    PT Home Exercise Plan 5/28 - cervical retraction, UT and 3-way doorway pec stretches, scap retraction & depression; 6/21 - LS stretch, hooklying yellow TB scap retraction + B shoulder horiz ABD and shoulder ER    Consulted and Agree with Plan of Care Patient           Patient will benefit from skilled therapeutic  intervention in order to improve the following deficits and impairments:  Decreased activity tolerance, Decreased knowledge of precautions, Decreased range of motion, Decreased strength, Hypomobility, Increased fascial restricitons, Increased muscle spasms, Impaired perceived functional ability, Impaired flexibility, Impaired UE functional use, Improper body mechanics, Postural dysfunction, Pain  Visit Diagnosis: Radiculopathy, cervicothoracic region  Abnormal posture  Other muscle spasm  Acute pain of right shoulder  Muscle weakness (generalized)     Problem List Patient Active Problem List   Diagnosis Date Noted  . Acute pain of right shoulder 09/30/2019  . Myofascial pain 09/18/2019  . Cervical radiculopathy 09/12/2019  . Essential hypertension, benign 05/18/2013  . [place]) ([place]/17[place]  . [place] bowel [place] ([place] 05/16/2013  . Malignant neoplasm of corpus uteri, except isthmus ([place] 05/16/2011    MBess Harvest PTA 11/25/19 11:47 AM   [facility] [address] [place] [address]Phone: [phone]  Fax:  [phone] Name: DRubina BasinskiMRN: [id number] of Birth: [birth year]

## 2019-11-25 NOTE — Assessment & Plan Note (Signed)
Pain is still ongoing and seems to track at the C6 dermatome.  MRI was revealing for impingement of the C8 nerve root. -Counseled on supportive care. -Can continue physical therapy. -Could consider switching from Prolia to [place]. -Could consider epidural.

## 2019-11-25 NOTE — Progress Notes (Signed)
Virtual Visit via Telephone Note  I connected with [name] on 11/25/19 at  8:10 AM EDT by telephone and verified that I am speaking with the correct person using two identifiers.   I discussed the limitations, risks, security and privacy concerns of performing an evaluation and management service by telephone and the availability of in person appointments. I also discussed with the patient that there may be a patient responsible charge related to this service. The patient expressed understanding and agreed to proceed.  Patient: home  Physician: office.   History of Present Illness:  [name] is a 79 yo F that is presenting for follow up with MRi of her cervical spine.  She still has pain down the posterior aspect of her arm into her hand.  Has been continuing physical therapy.   Observations/Objective:   Assessment and Plan:  Cervical radiculopathy: Pain is still ongoing and seems to track at the C6 dermatome.  MRI was revealing for impingement of the C8 nerve root. -Counseled on supportive care. -Can continue physical therapy. -Could consider switching from Prolia to [place]. -Could consider epidural.  Follow Up Instructions:    I discussed the assessment and treatment plan with the patient. The patient was provided an opportunity to ask questions and all were answered. The patient agreed with the plan and demonstrated an understanding of the instructions.   The patient was advised to call back or seek an in-person evaluation if the symptoms worsen or if the condition fails to improve as anticipated.  I provided 10 minutes of non-face-to-face time during this encounter.   Clearance Coots, MD

## 2019-11-27 ENCOUNTER — Other Ambulatory Visit: Payer: Self-pay

## 2019-11-27 ENCOUNTER — Ambulatory Visit: Payer: Medicare Other

## 2019-11-27 DIAGNOSIS — R293 Abnormal posture: Secondary | ICD-10-CM | POA: Diagnosis not present

## 2019-11-27 DIAGNOSIS — M6281 Muscle weakness (generalized): Secondary | ICD-10-CM

## 2019-11-27 DIAGNOSIS — M5413 Radiculopathy, cervicothoracic region: Secondary | ICD-10-CM | POA: Diagnosis not present

## 2019-11-27 DIAGNOSIS — M25511 Pain in right shoulder: Secondary | ICD-10-CM | POA: Diagnosis not present

## 2019-11-27 DIAGNOSIS — M62838 Other muscle spasm: Secondary | ICD-10-CM

## 2019-11-27 NOTE — Therapy (Signed)
[facility] [address]  [place], [place], [address] Phone: [phone]   Fax:  [phone]  Physical Therapy Treatment  Patient Details  Name: [name] MRN: [id number] Date of Birth: 1940/05/14 Referring Provider (PT): Clearance Coots, MD   Encounter Date: 11/27/2019   PT End of Session - 11/27/19 5726    Visit Number 12    Number of Visits 19    Date for PT Re-Evaluation 12/12/19    Authorization Type Medicare & [employer]    Progress Note Due on Visit 17   Recert on visit #7 - 2/0/35   PT Start Time 5974   Pt. arrived late   PT Stop Time 1615    PT Time Calculation (min) 40 min    Activity Tolerance Patient tolerated treatment well    Behavior During Therapy [facility] for tasks assessed/performed           Past Medical History:  Diagnosis Date  . Allergy   . Anxiety    takes ativan as needed  . Benign positional vertigo   . Bursitis of shoulder, left   . Cancer [facility])    Endometrial ca/Recurrence  . CKD (chronic kidney disease), stage III   . History of colonic polyps   . Hypercholesteremia   . Hypercholesterolemia   . Hypertension   . Lumbar pain   . Osteoarthritis   . Osteoporosis   . S/P radiation therapy July 29, 2010-Sep 06, 2010   External beam of pelvis  . S/P radiation therapy 09/15/10, 09/24/10, 09/28/2010   Intracavitary brachytherapy  . SBO (small bowel obstruction) ([place])   . Status post chemotherapy    carboplatin/paclitaxel x 6 rounds    Past Surgical History:  Procedure Laterality Date  . ABDOMINAL HYSTERECTOMY    . BREAST LUMPECTOMY  1993   Left breast, benign  . ROBOTIC ASSISTED LAPAROSCOPIC VAGINAL HYSTERECTOMY WITH FIBROID REMOVAL  July 2010   bilat. salpingo-oophorectomy    There were no vitals filed for this visit.   Subjective Assessment - 11/27/19 1540    Subjective Pt. doing ok.    Pertinent History HTN, endometrial cancer, anxiety, BPPV, L shoulder bursitis,  CKD, OA, osteoporosis    Diagnostic tests Cervical MRI 11/10/19: Multilevel spondylosis. Multilevel foraminal encroachment due to spurring throughout the cervical spine. Right foraminal disc protrusion at C7-T1 with right C8 nerve root impingement.   R shoulder x-ray 09/10/19 - Unremarkable    Patient Stated Goals "No pain"    Currently in Pain? No/denies    Pain Score 0-No pain    Multiple Pain Sites No                             [facility] Adult PT Treatment/Exercise - 11/27/19 0001      Neck Exercises: Machines for Strengthening   UBE (Upper Arm Bike) L1.0 x 6' (3' fwd/3' back)    Cybex Row 10# x 15 reps - low row      Neck Exercises: Theraband   Shoulder Extension 10 reps    Shoulder Extension Limitations yellow TB closed in door     Rows 10 reps;Red    Rows Limitations Cues for scapular retraction     Horizontal ABduction 10 reps    Horizontal ABduction Limitations yellow TB       Neck Exercises: Seated   Money 15 reps;3 secs    Money Limitations " no Money "  leaning on doorframe      Manual Therapy   Manual Therapy Soft tissue mobilization;Myofascial release    Manual therapy comments sitting     Soft tissue mobilization DTM to B UT, LS, scalanes     Myofascial Release Manual TPR to B UT - limited response       Neck Exercises: Stretches   Upper Trapezius Stretch Right;Left;1 rep;30 seconds    Upper Trapezius Stretch Limitations 5# dumbbell in oppo hand    Levator Stretch Right;Left;1 rep;30 seconds    Levator Stretch Limitations 5# dumbell in oppo hand    Corner Stretch 1 rep;30 seconds    Corner Stretch Limitations B single arm low on doorway    improved tolerance for single arm chest stretch                   PT Short Term Goals - 10/31/19 1549      PT SHORT TERM GOAL #1   Title Patient will be independent with initial HEP    Status Achieved   10/02/19     PT SHORT TERM GOAL #2   Title Patient will verbalize/demonstrate understanding of  neutral spine and shoulder posture and proper body mechanics to reduce strain on cervical spine    Status Achieved   10/31/19            PT Long Term Goals - 10/31/19 1549      PT LONG TERM GOAL #1   Title Patient will be independent with ongoing/advanced HEP    Status Partially Met   10/31/19 - met for current HEP   Target Date 12/12/19      PT LONG TERM GOAL #2   Title Patient to demonstrate ability to achieve and maintain good spinal and shoulder alignment/posturing    Status Partially Met   10/31/19 - notes good awareness of desired posture but not always consistent   Target Date 12/12/19      PT LONG TERM GOAL #3   Title Patient to report pain reduction in frequency and intensity by >/= 50%    Status Achieved   10/14/19 - 75% improved     PT LONG TERM GOAL #4   Title Patient to improve cervical AROM to [facility] without pain provocation    Status On-going    Target Date 12/12/19      PT LONG TERM GOAL #5   Title Patient will demonstrate improved B shoulder strength to >/= 4+/5 for functional UE use    Status Partially Met    Target Date 12/12/19      PT LONG TERM GOAL #6   Title Patient to report ability to perform work and daily activities without pain provocation    Status Partially Met    Target Date 12/12/19                 Plan - 11/27/19 1542    Clinical Impression Statement Session focused on anterior chest stretching and advancement of postural strengthening activities.  [name] able to progress wand resistance with upright and machine retraction strengthening.  MT addressed ongoing B UT TPs with some limited response and would likely benefit from further DN in this muscle group.  Pt. does feel benefit from DN in past sessions and anticipates trying more DN with supervising PT in coming sessions.  Ended visit pain free however did note intermittent R UE numbness with doorway chest stretch which subsided with rest.    Comorbidities HTN, endometrial cancer, anxiety,  BPPV, L shoulder bursitis, CKD, OA, osteoporosis    Rehab Potential Good    PT Treatment/Interventions ADLs/Self Care Home Management;Cryotherapy;Electrical Stimulation;Iontophoresis 43m/ml Dexamethasone;Moist Heat;Ultrasound;Functional mobility training;Therapeutic activities;Therapeutic exercise;Neuromuscular re-education;Patient/family education;Manual techniques;Passive range of motion;Dry needling;Taping;Spinal Manipulations;Joint Manipulations    PT Next Visit Plan Postural flexibilty and strengthening; cervical ROM; manual therapy including possible DN to address pain, increased muscle tension and cervical ROM; modalities PRN; review of posture and body mechanics education PRN    PT Home Exercise Plan 5/28 - cervical retraction, UT and 3-way doorway pec stretches, scap retraction & depression; 6/21 - LS stretch, hooklying yellow TB scap retraction + B shoulder horiz ABD and shoulder ER    Consulted and Agree with Plan of Care Patient           Patient will benefit from skilled therapeutic intervention in order to improve the following deficits and impairments:  Decreased activity tolerance, Decreased knowledge of precautions, Decreased range of motion, Decreased strength, Hypomobility, Increased fascial restricitons, Increased muscle spasms, Impaired perceived functional ability, Impaired flexibility, Impaired UE functional use, Improper body mechanics, Postural dysfunction, Pain  Visit Diagnosis: Radiculopathy, cervicothoracic region  Abnormal posture  Other muscle spasm  Acute pain of right shoulder  Muscle weakness (generalized)     Problem List Patient Active Problem List   Diagnosis Date Noted  . Acute pain of right shoulder 09/30/2019  . Myofascial pain 09/18/2019  . Cervical radiculopathy 09/12/2019  . Essential hypertension, benign 05/18/2013  . [place] 05[place]17[place]  . [place] 05/16[place]2013  . Malignant neoplasm of corpus  uteri, except isthmus ([place] 05/16/2011    MBess Bartlett PTA 11/27/19 4:44 PM   [facility] [address] [place] [address]Phone: [phone]  Fax:  [phone] Name: DSya NestlerMRN: [id number] of Birth: [birth year]

## 2019-12-02 ENCOUNTER — Other Ambulatory Visit: Payer: Self-pay

## 2019-12-02 ENCOUNTER — Ambulatory Visit: Payer: Medicare Other

## 2019-12-02 DIAGNOSIS — M62838 Other muscle spasm: Secondary | ICD-10-CM

## 2019-12-02 DIAGNOSIS — R293 Abnormal posture: Secondary | ICD-10-CM

## 2019-12-02 DIAGNOSIS — M6281 Muscle weakness (generalized): Secondary | ICD-10-CM

## 2019-12-02 DIAGNOSIS — M25511 Pain in right shoulder: Secondary | ICD-10-CM | POA: Diagnosis not present

## 2019-12-02 DIAGNOSIS — M5413 Radiculopathy, cervicothoracic region: Secondary | ICD-10-CM

## 2019-12-02 NOTE — Therapy (Signed)
[facility] [address]  [place], [place], [address] Phone: [phone]   Fax:  [phone]  Physical Therapy Treatment  Patient Details  Name: [name] MRN: [id number] Date of Birth: [birth year] Referring Provider (PT): Clearance Coots, MD   Encounter Date: 12/02/2019   PT End of Session - 12/02/19 1030    Visit Number 13    Number of Visits 19    Date for PT Re-Evaluation 12/12/19    Authorization Type Medicare & [employer]    Progress Note Due on Visit 17   Recert on visit #7 - 11/24/40   PT Start Time 1025   pt. arrived late to session thus tx time limited   PT Stop Time 1103    PT Time Calculation (min) 38 min    Activity Tolerance Patient tolerated treatment well    Behavior During Therapy [facility] for tasks assessed/performed           Past Medical History:  Diagnosis Date  . Allergy   . Anxiety    takes ativan as needed  . Benign positional vertigo   . Bursitis of shoulder, left   . Cancer [facility])    Endometrial ca/Recurrence  . CKD (chronic kidney disease), stage III   . History of colonic polyps   . Hypercholesteremia   . Hypercholesterolemia   . Hypertension   . Lumbar pain   . Osteoarthritis   . Osteoporosis   . S/P radiation therapy July 29, 2010-Sep 06, 2010   External beam of pelvis  . S/P radiation therapy 09/15/10, 09/24/10, 09/28/2010   Intracavitary brachytherapy  . SBO (small bowel obstruction) ([place])   . Status post chemotherapy    carboplatin/paclitaxel x 6 rounds    Past Surgical History:  Procedure Laterality Date  . ABDOMINAL HYSTERECTOMY    . BREAST LUMPECTOMY  1993   Left breast, benign  . ROBOTIC ASSISTED LAPAROSCOPIC VAGINAL HYSTERECTOMY WITH FIBROID REMOVAL  July 2010   bilat. salpingo-oophorectomy    There were no vitals filed for this visit.   Subjective Assessment - 12/02/19 1027    Subjective Pt. has been doing ok with no new complaints.    Pertinent History  HTN, endometrial cancer, anxiety, BPPV, L shoulder bursitis, CKD, OA, osteoporosis    Diagnostic tests Cervical MRI 11/10/19: Multilevel spondylosis. Multilevel foraminal encroachment due to spurring throughout the cervical spine. Right foraminal disc protrusion at C7-T1 with right C8 nerve root impingement.   R shoulder x-ray 09/10/19 - Unremarkable    Patient Stated Goals "No pain"    Currently in Pain? Yes    Pain Score 4     Pain Location Neck    Pain Orientation Lower;Right    Pain Descriptors / Indicators --   " aggrevating pain "             [facility] PT Assessment - 12/02/19 0001      AROM   Cervical Flexion 60    Cervical Extension 55    Cervical - Right Side Bend 34    Cervical - Left Side Bend 35    Cervical - Right Rotation 55    Cervical - Left Rotation 70                         [facility] Adult PT Treatment/Exercise - 12/02/19 0001      Self-Care   Self-Care Other Self-Care Comments;Posture    Posture Instruction in  need for frequent change in positioning and proper desk positioning with work tasks to reduce cervical strain    Other Self-Care Comments  Instruction in general body mechanics with daily activities to reduce cervical strain       Neck Exercises: Machines for Strengthening   UBE (Upper Arm Bike) L1.0 x 6' (3' fwd/3' back)    Cybex Row 10# x 15 reps - low row    Other Machines for Strengthening machine mid row 10# x 10 reps       Neck Exercises: Theraband   Horizontal ABduction 10 reps    Horizontal ABduction Limitations red TB seated in chair       Neck Exercises: Stretches   Upper Trapezius Stretch Right;Left;1 rep;30 seconds    Levator Stretch Right;Left;1 rep;30 seconds    Corner Stretch 1 rep;30 seconds    Corner Stretch Limitations B single arm low on doorway                     PT Short Term Goals - 10/31/19 1549      PT SHORT TERM GOAL #1   Title Patient will be independent with initial HEP    Status Achieved    10/02/19     PT SHORT TERM GOAL #2   Title Patient will verbalize/demonstrate understanding of neutral spine and shoulder posture and proper body mechanics to reduce strain on cervical spine    Status Achieved   10/31/19            PT Long Term Goals - 12/02/19 1232      PT LONG TERM GOAL #1   Title Patient will be independent with ongoing/advanced HEP    Status Partially Met   10/31/19 - met for current HEP     PT LONG TERM GOAL #2   Title Patient to demonstrate ability to achieve and maintain good spinal and shoulder alignment/posturing    Status Partially Met   10/31/19 - notes good awareness of desired posture but not always consistent     PT LONG TERM GOAL #3   Title Patient to report pain reduction in frequency and intensity by >/= 50%    Status Achieved   10/14/19 - 75% improved     PT LONG TERM GOAL #4   Title Patient to improve cervical AROM to [facility] without pain provocation    Status Partially Met      PT LONG TERM GOAL #5   Title Patient will demonstrate improved B shoulder strength to >/= 4+/5 for functional UE use    Status Partially Met      PT LONG TERM GOAL #6   Title Patient to report ability to perform work and daily activities without pain provocation    Status Partially Met                 Plan - 12/02/19 1030    Clinical Impression Statement Pt. arrived late to session thus treatment time limited.  Pt. noting ~ 65% improvement in PT since starting therapy.  Able to demonstrate improved cervical AROM most notable in B side bending AROM.  Progressed postural strengthening activities today without pain and further reviewed proper desk positioning to reduce cervical strain at work as pt. spending significant time working from home on laptop.  Ended visit pain free thus modalities deferred.    Comorbidities HTN, endometrial cancer, anxiety, BPPV, L shoulder bursitis, CKD, OA, osteoporosis    Rehab Potential Good    PT  Frequency 2x / week    PT  Treatment/Interventions ADLs/Self Care Home Management;Cryotherapy;Electrical Stimulation;Iontophoresis 46m/ml Dexamethasone;Moist Heat;Ultrasound;Functional mobility training;Therapeutic activities;Therapeutic exercise;Neuromuscular re-education;Patient/family education;Manual techniques;Passive range of motion;Dry needling;Taping;Spinal Manipulations;Joint Manipulations    PT Next Visit Plan Postural flexibilty and strengthening; cervical ROM; manual therapy including possible DN to address pain, increased muscle tension and cervical ROM; modalities PRN; review of posture and body mechanics education PRN    PT Home Exercise Plan 5/28 - cervical retraction, UT and 3-way doorway pec stretches, scap retraction & depression; 6/21 - LS stretch, hooklying yellow TB scap retraction + B shoulder horiz ABD and shoulder ER    Consulted and Agree with Plan of Care Patient           Patient will benefit from skilled therapeutic intervention in order to improve the following deficits and impairments:  Decreased activity tolerance, Decreased knowledge of precautions, Decreased range of motion, Decreased strength, Hypomobility, Increased fascial restricitons, Increased muscle spasms, Impaired perceived functional ability, Impaired flexibility, Impaired UE functional use, Improper body mechanics, Postural dysfunction, Pain  Visit Diagnosis: Radiculopathy, cervicothoracic region  Abnormal posture  Other muscle spasm  Acute pain of right shoulder  Muscle weakness (generalized)     Problem List Patient Active Problem List   Diagnosis Date Noted  . Acute pain of right shoulder 09/30/2019  . Myofascial pain 09/18/2019  . Cervical radiculopathy 09/12/2019  . Essential hypertension, benign 05/18/2013  . S[place] (small [place] obstruction) [place] 05[place]17[place]2013  . Small bowel obstruction ([place] 05/16/2013  . Malignant neoplasm of corpus uteri, except isthmus ([place] 05/16/2011    MBess Harvest PTA 12/02/19 12:32  PM   [facility] [address] [place] [address]Phone: [phone]  Fax:  [phone] Name: DKeiasia ChristiansonMRN: [id number] of Birth: [birth year]

## 2019-12-04 ENCOUNTER — Other Ambulatory Visit: Payer: Self-pay

## 2019-12-04 ENCOUNTER — Ambulatory Visit: Payer: Medicare Other | Admitting: Physical Therapy

## 2019-12-04 ENCOUNTER — Encounter: Payer: Self-pay | Admitting: Physical Therapy

## 2019-12-04 DIAGNOSIS — M6281 Muscle weakness (generalized): Secondary | ICD-10-CM | POA: Diagnosis not present

## 2019-12-04 DIAGNOSIS — M5413 Radiculopathy, cervicothoracic region: Secondary | ICD-10-CM

## 2019-12-04 DIAGNOSIS — R293 Abnormal posture: Secondary | ICD-10-CM

## 2019-12-04 DIAGNOSIS — M62838 Other muscle spasm: Secondary | ICD-10-CM | POA: Diagnosis not present

## 2019-12-04 DIAGNOSIS — M25511 Pain in right shoulder: Secondary | ICD-10-CM | POA: Diagnosis not present

## 2019-12-04 NOTE — Therapy (Signed)
[facility] [place] [address]  [place], [place], [address] Phone: [phone]   Fax:  [phone]  Physical Therapy Treatment  Patient Details  Name: [name] MRN: [id number] Date of Birth: [birth year] Referring Provider (PT): Clearance Coots, MD   Encounter Date: 12/04/2019   PT End of Session - 12/04/19 1147    Visit Number 14    Number of Visits 19    Date for PT Re-Evaluation 12/12/19    Authorization Type Medicare & [employer]    Progress Note Due on Visit 17   Recert on visit #7 - 09/27/01   PT Start Time 1147    PT Stop Time 1301    PT Time Calculation (min) 74 min    Activity Tolerance Patient tolerated treatment well    Behavior During Therapy [facility] for tasks assessed/performed           Past Medical History:  Diagnosis Date   Allergy    Anxiety    takes ativan as needed   Benign positional vertigo    Bursitis of shoulder, left    Cancer ([place])    Endometrial ca/Recurrence   CKD (chronic kidney disease), stage III    History of colonic polyps    Hypercholesteremia    Hypercholesterolemia    Hypertension    Lumbar pain    Osteoarthritis    Osteoporosis    S/P radiation therapy July 29, 2010-Sep 06, 2010   External beam of pelvis   S/P radiation therapy 09/15/10, 09/24/10, 09/28/2010   Intracavitary brachytherapy   SBO (small bowel obstruction) ([place])    Status post chemotherapy    carboplatin/paclitaxel x 6 rounds    Past Surgical History:  Procedure Laterality Date   ABDOMINAL HYSTERECTOMY     BREAST LUMPECTOMY  1993   Left breast, benign   ROBOTIC ASSISTED LAPAROSCOPIC VAGINAL HYSTERECTOMY WITH FIBROID REMOVAL  July 2010   bilat. salpingo-oophorectomy    There were no vitals filed for this visit.   Subjective Assessment - 12/04/19 1154    Subjective Pt still experiencing occasional numbness into R arm (positionally related) with 5th digit remaining numb all the  time.    Pertinent History HTN, endometrial cancer, anxiety, BPPV, L shoulder bursitis, CKD, OA, osteoporosis    Diagnostic tests Cervical MRI 11/10/19: Multilevel spondylosis. Multilevel foraminal encroachment due to spurring throughout the cervical spine. Right foraminal disc protrusion at C7-T1 with right C8 nerve root impingement.   R shoulder x-ray 09/10/19 - Unremarkable    Patient Stated Goals "No pain"    Currently in Pain? Yes    Pain Score 3    3-4/10   Pain Location Neck    Pain Orientation Right;Lower    Pain Radiating Towards intermittent pain and numbness into R arm to hand    Pain Frequency Intermittent                             [facility] Adult PT Treatment/Exercise - 12/04/19 1147      Neck Exercises: Machines for Strengthening   UBE (Upper Arm Bike) L1.5 x 6' (3' fwd/3' back)      Neck Exercises: Seated   Cervical Rotation Right;10 reps    Cervical Rotation Limitations rotational SNAGs with pillowcase    Other Seated Exercise Thoracic extension mobs over back of chair 10 x 5"    Other Seated Exercise R 1st rib mobilization + cervical rotation &  side bending x 10 each      Manual Therapy   Manual Therapy Joint mobilization;Soft tissue mobilization;Myofascial release    Manual therapy comments skilled palpation and monitoring during DN    Joint Mobilization R 1st & 2nd rib P/A and inferior mobs    Soft tissue mobilization STM/DTM to B UT, LS and scalenes - increased tension and TPs on R    Myofascial Release manual TPR to R UT, LS & scalenes; pin and stretch to R UT and LS            Trigger Point Dry Needling - 12/04/19 1147    Consent Given? Yes    Muscles Treated Head and Neck Upper trapezius;Levator scapulae;Scalenes   Rt   Upper Trapezius Response Twitch reponse elicited;Palpable increased muscle length    Levator Scapulae Response Twitch response elicited;Palpable increased muscle length    Scalenes Response Twitch reponse elicited;Palpable  increased muscle length                PT Education - 12/04/19 1301    Education Details HEP update - thoracic extension mobs, cervical rotation SNAGs, 1st rib mobs + cervical ROM    Person(s) Educated Patient    Methods Explanation;Demonstration;Verbal cues;Tactile cues;Handout    Comprehension Verbalized understanding;Returned demonstration;Verbal cues required;Tactile cues required;Need further instruction            PT Short Term Goals - 10/31/19 1549      PT SHORT TERM GOAL #1   Title Patient will be independent with initial HEP    Status Achieved   10/02/19     PT SHORT TERM GOAL #2   Title Patient will verbalize/demonstrate understanding of neutral spine and shoulder posture and proper body mechanics to reduce strain on cervical spine    Status Achieved   10/31/19            PT Long Term Goals - 12/02/19 1232      PT LONG TERM GOAL #1   Title Patient will be independent with ongoing/advanced HEP    Status Partially Met   10/31/19 - met for current HEP     PT LONG TERM GOAL #2   Title Patient to demonstrate ability to achieve and maintain good spinal and shoulder alignment/posturing    Status Partially Met   10/31/19 - notes good awareness of desired posture but not always consistent     PT LONG TERM GOAL #3   Title Patient to report pain reduction in frequency and intensity by >/= 50%    Status Achieved   10/14/19 - 75% improved     PT LONG TERM GOAL #4   Title Patient to improve cervical AROM to [facility] without pain provocation    Status Partially Met      PT LONG TERM GOAL #5   Title Patient will demonstrate improved B shoulder strength to >/= 4+/5 for functional UE use    Status Partially Met      PT LONG TERM GOAL #6   Title Patient to report ability to perform work and daily activities without pain provocation    Status Partially Met                 Plan - 12/04/19 1301    Clinical Impression Statement [name] reports ongoing intermittent R sided  neck pain and R UE radiculopathy but still unable to identify particular triggering position or motions. She does note that it has been less often since she has started  standing more while working as reaching for her keyboard in sitting had previously been a trigger. She still demonstrates somewhat of a forward head and rounded shoulder posture but this is improving - worked on thoracic extension mobilization over back of chair to facilitate postural correction with good tolerance. Cervical ROM improving as of assessment last visit but continued stiffness noted, therefore introduced cervical rotation SNAGs with pt noting immediate improvement in stiffness. Continued increased muscle tension persisting in R lateral neck and upper shoulder which was addressed with self-1st rib mobs and MT with DN. Session concluded with estim and moist heat to promote further muscle relaxation.    Comorbidities HTN, endometrial cancer, anxiety, BPPV, L shoulder bursitis, CKD, OA, osteoporosis    Rehab Potential Good    PT Frequency 2x / week    PT Duration 6 weeks    PT Treatment/Interventions ADLs/Self Care Home Management;Cryotherapy;Electrical Stimulation;Iontophoresis 13m/ml Dexamethasone;Moist Heat;Ultrasound;Functional mobility training;Therapeutic activities;Therapeutic exercise;Neuromuscular re-education;Patient/family education;Manual techniques;Passive range of motion;Dry needling;Taping;Spinal Manipulations;Joint Manipulations    PT Next Visit Plan Review latest HEP update as needed + HEP review if pt planning on transitioning to HEP at end of current POC; postural flexibilty and strengthening; cervical ROM; manual therapy including possible DN to address pain, increased muscle tension and cervical ROM; modalities PRN; review of posture and body mechanics education PRN    PT Home Exercise Plan 5/28 - cervical retraction, UT and 3-way doorway pec stretches, scap retraction & depression; 6/21 - LS stretch, hooklying  yellow TB scap retraction + B shoulder horiz ABD and shoulder ER    Consulted and Agree with Plan of Care Patient           Patient will benefit from skilled therapeutic intervention in order to improve the following deficits and impairments:  Decreased activity tolerance, Decreased knowledge of precautions, Decreased range of motion, Decreased strength, Hypomobility, Increased fascial restricitons, Increased muscle spasms, Impaired perceived functional ability, Impaired flexibility, Impaired UE functional use, Improper body mechanics, Postural dysfunction, Pain  Visit Diagnosis: Radiculopathy, cervicothoracic region  Abnormal posture  Other muscle spasm  Acute pain of right shoulder  Muscle weakness (generalized)     Problem List Patient Active Problem List   Diagnosis Date Noted   Acute pain of right shoulder 09/30/2019   Myofascial pain 09/18/2019   Cervical radiculopathy 09/12/2019   Essential hypertension, benign 05/18/2013   [place]   [place] 05/16[place]   Malignant neoplasm of corpus uteri, except isthmus ([place] 05/16/2011    JPercival Spanish PT, MPT 12/04/2019, 1:18 PM  [facility] [place] [address]Phone: [phone]  Fax:  [phone] Name: DTaiyana KisslerMRN: [id number] of Birth: [birth year]

## 2019-12-04 NOTE — Patient Instructions (Signed)
    Home exercise program created by Joniya Boberg, PT.  For questions, please contact Jan Olano via phone at [phone] or email at [email or web address]  [facility] [place] [address]  [address] [place], [place], [address] Phone: [phone]   Fax:  [phone]    

## 2019-12-11 ENCOUNTER — Ambulatory Visit: Payer: Medicare Other | Admitting: Physical Therapy

## 2019-12-16 ENCOUNTER — Encounter: Payer: Self-pay | Admitting: Physical Therapy

## 2019-12-16 ENCOUNTER — Other Ambulatory Visit: Payer: Self-pay

## 2019-12-16 ENCOUNTER — Ambulatory Visit: Payer: Medicare Other | Admitting: Physical Therapy

## 2019-12-16 DIAGNOSIS — M62838 Other muscle spasm: Secondary | ICD-10-CM | POA: Diagnosis not present

## 2019-12-16 DIAGNOSIS — R293 Abnormal posture: Secondary | ICD-10-CM | POA: Diagnosis not present

## 2019-12-16 DIAGNOSIS — M5413 Radiculopathy, cervicothoracic region: Secondary | ICD-10-CM | POA: Diagnosis not present

## 2019-12-16 DIAGNOSIS — M6281 Muscle weakness (generalized): Secondary | ICD-10-CM | POA: Diagnosis not present

## 2019-12-16 DIAGNOSIS — M25511 Pain in right shoulder: Secondary | ICD-10-CM

## 2019-12-16 NOTE — Therapy (Signed)
[facility] [place] [address]  [place], [place], [address] Phone: [phone]   Fax:  [phone]  Physical Therapy Treatment / Progress Note / Recert  Patient Details  Name: [name] MRN: [id number] Date of Birth: 01-20-1941 Referring Provider (PT): Clearance Coots, MD  Progress Note  Reporting Period 10/31/2019 to 12/16/2019  See note below for Objective Data and Assessment of Progress/Goals.     Encounter Date: 12/16/2019   PT End of Session - 12/16/19 1020    Visit Number 15    Number of Visits 19    Date for PT Re-Evaluation 01/13/20    Authorization Type Medicare & [employer]    PT Start Time 1020    PT Stop Time 1105    PT Time Calculation (min) 45 min    Activity Tolerance Patient tolerated treatment well    Behavior During Therapy WFL for tasks assessed/performed           Past Medical History:  Diagnosis Date  . Allergy   . Anxiety    takes ativan as needed  . Benign positional vertigo   . Bursitis of shoulder, left   . Cancer [facility])    Endometrial ca/Recurrence  . CKD (chronic kidney disease), stage III   . History of colonic polyps   . Hypercholesteremia   . Hypercholesterolemia   . Hypertension   . Lumbar pain   . Osteoarthritis   . Osteoporosis   . S/P radiation therapy July 29, 2010-Sep 06, 2010   External beam of pelvis  . S/P radiation therapy 09/15/10, 09/24/10, 09/28/2010   Intracavitary brachytherapy  . SBO (small bowel obstruction) ([place])   . Status post chemotherapy    carboplatin/paclitaxel x 6 rounds    Past Surgical History:  Procedure Laterality Date  . ABDOMINAL HYSTERECTOMY    . BREAST LUMPECTOMY  1993   Left breast, benign  . ROBOTIC ASSISTED LAPAROSCOPIC VAGINAL HYSTERECTOMY WITH FIBROID REMOVAL  July 2010   bilat. salpingo-oophorectomy    There were no vitals filed for this visit.   Subjective Assessment - 12/16/19 1023    Subjective Pt reports she has  not been able to do her exercises as much over the past week due to flare-up of post chemo intestinal issues but feeling better now.    Pertinent History HTN, endometrial cancer, anxiety, BPPV, L shoulder bursitis, CKD, OA, osteoporosis    Diagnostic tests Cervical MRI 11/10/19: Multilevel spondylosis. Multilevel foraminal encroachment due to spurring throughout the cervical spine. Right foraminal disc protrusion at C7-T1 with right C8 nerve root impingement.   R shoulder x-ray 09/10/19 - Unremarkable    Patient Stated Goals "No pain"    Currently in Pain? Yes    Pain Score 3    3-4/10   Pain Location Neck   & upper shoulder   Pain Orientation Right;Lower    Pain Descriptors / Indicators Dull;Aching    Pain Type Chronic pain    Pain Radiating Towards intermittent to constant numbness and tingling in 4th & 5th digits of R hand (near constant in 5th digit)    Pain Onset More than a month ago    Aggravating Factors  "overdoing things" (i.e. - working in the garden)              [facility] PT Assessment - 12/16/19 1020      Assessment   Medical Diagnosis Cervical radiculopathy, R shoulder pain    Referring Provider (PT) Ysidro Evert  Raeford Razor, MD    Hand Dominance Right    Next MD Visit 12/19/19      AROM   Cervical Flexion 65    Cervical Extension 51    Cervical - Right Side Bend 34    Cervical - Left Side Bend 29    Cervical - Right Rotation 69    Cervical - Left Rotation 70      Strength   Right Shoulder Flexion 4+/5    Right Shoulder ABduction 4+/5    Right Shoulder Internal Rotation 4+/5    Right Shoulder External Rotation 4+/5    Left Shoulder Flexion 4+/5    Left Shoulder ABduction 4+/5    Left Shoulder Internal Rotation 4+/5    Left Shoulder External Rotation 4+/5                         [facility] Adult PT Treatment/Exercise - 12/16/19 1020      Neck Exercises: Machines for Strengthening   UBE (Upper Arm Bike) L1.5 x 6' (3' fwd/3' back)      Hand Exercises for  Cervical Radiculopathy   Other Hand Exercise for Cervical Radiculopathy Rt ulnar nerve glide x 10      Manual Therapy   Manual Therapy Soft tissue mobilization;Myofascial release    Manual therapy comments skilled palpation and monitoring during DN    Soft tissue mobilization STM/DTM to R>L UT & LS and R infraspinatus - increased tension and TPs on R    Myofascial Release manual TPR to R UT, LS & infraspinatus            Trigger Point Dry Needling - 12/16/19 1020    Consent Given? Yes    Muscles Treated Head and Neck Upper trapezius;Levator scapulae   Rt   Muscles Treated Upper Quadrant Infraspinatus   Rt   Upper Trapezius Response Twitch reponse elicited;Palpable increased muscle length    Levator Scapulae Response Twitch response elicited;Palpable increased muscle length    Infraspinatus Response Twitch response elicited;Palpable increased muscle length                PT Education - 12/16/19 1048    Education Details HEP update - ulnar nerve glide    Person(s) Educated Patient    Methods Explanation;Demonstration;Verbal cues;Tactile cues;Handout    Comprehension Verbalized understanding;Verbal cues required;Tactile cues required;Returned demonstration;Need further instruction            PT Short Term Goals - 10/31/19 1549      PT SHORT TERM GOAL #1   Title Patient will be independent with initial HEP    Status Achieved   10/02/19     PT SHORT TERM GOAL #2   Title Patient will verbalize/demonstrate understanding of neutral spine and shoulder posture and proper body mechanics to reduce strain on cervical spine    Status Achieved   10/31/19            PT Long Term Goals - 12/16/19 1030      PT LONG TERM GOAL #1   Title Patient will be independent with ongoing/advanced HEP    Status Partially Met    Target Date 01/13/20      PT LONG TERM GOAL #2   Title Patient to demonstrate ability to achieve and maintain good spinal and shoulder alignment/posturing     Status Achieved   12/16/19     PT LONG TERM GOAL #3   Title Patient to report pain reduction in frequency  and intensity by >/= 50%    Status Achieved   10/14/19 - 75% improved; 12/16/19 - 85% improved     PT LONG TERM GOAL #4   Title Patient to improve cervical AROM to [facility] without pain provocation    Status Achieved   12/16/19     PT LONG TERM GOAL #5   Title Patient will demonstrate improved B shoulder strength to >/= 4+/5 for functional UE use    Status Achieved   12/16/19     PT LONG TERM GOAL #6   Title Patient to report ability to perform work and daily activities without pain provocation    Status Partially Met    Target Date 01/13/20                 Plan - 12/16/19 1217    Clinical Impression Statement [name] reports she feels that she is at least 85% improved since start of PT. She notes improved postural awareness and feels that better posture has helped alleviate many of her symptoms, although R shoulder still tends to be elevated relative to L and she still notes some numbness and tingling in R 4th & 5th digits (more constant in 5th digit). DN performed to help reduce tension in upper shoulder musculature and infraspinatus (pt noting lateral intermittent lateral shoulder pain which appeared to be referred pain from infraspinatus) and instructed her ulnar nerve glides with pt noting some relief from numbness and tingling. Cervical ROM now essentially WFL/WNL w/o pain provocation and B shoulder strength now 4+/5 w/o pain upon resistance. Pt having difficulty identifying particular tasks where she still feels limited but does report pain will increase when she "over does" things. Pt would like to continue with PT for "fine tuning" of her HEP and further DN as indicated, therefore will recommend recert with frequency reduced to 1x/wk x 4 weeks in preparation for full transition to HEP.    Comorbidities HTN, endometrial cancer, anxiety, BPPV, L shoulder bursitis, CKD, OA, osteoporosis      Rehab Potential Good    PT Frequency 1x / week    PT Duration 4 weeks    PT Treatment/Interventions ADLs/Self Care Home Management;Cryotherapy;Electrical Stimulation;Iontophoresis 76m/ml Dexamethasone;Moist Heat;Ultrasound;Functional mobility training;Therapeutic activities;Therapeutic exercise;Neuromuscular re-education;Patient/family education;Manual techniques;Passive range of motion;Dry needling;Taping;Spinal Manipulations;Joint Manipulations    PT Next Visit Plan Review and update as needed in prep for transitioning to HEP; postural flexibilty and strengthening; cervical ROM; manual therapy including possible DN to address pain, increased muscle tension and cervical ROM; modalities PRN; review of posture and body mechanics education PRN    PT Home Exercise Plan 5/28 - cervical retraction, UT and 3-way doorway pec stretches, scap retraction & depression; 6/21 - LS stretch, hooklying yellow TB scap retraction + B shoulder horiz ABD and shoulder ER; 8/11 - thoracic extension over back of chair, cervical rotation SNAGs, 1st rib mob + cervical rotation & side bending; 8/23 - ulnar nerve glide    Consulted and Agree with Plan of Care Patient           Patient will benefit from skilled therapeutic intervention in order to improve the following deficits and impairments:  Decreased activity tolerance, Decreased knowledge of precautions, Decreased range of motion, Decreased strength, Hypomobility, Increased fascial restricitons, Increased muscle spasms, Impaired perceived functional ability, Impaired flexibility, Impaired UE functional use, Improper body mechanics, Postural dysfunction, Pain  Visit Diagnosis: Radiculopathy, cervicothoracic region  Abnormal posture  Other muscle spasm  Acute pain of right shoulder  Muscle weakness (generalized)  Problem List Patient Active Problem List   Diagnosis Date Noted  . Acute pain of right shoulder 09/30/2019  . Myofascial pain 09/18/2019  .  Cervical radiculopathy 09/12/2019  . Essential hypertension, benign 05/18/2013  . SBO (small bowel obstruction) ([place]) 05/17/2013  . Small bowel obstruction ([place]) 05/16/2013  . Malignant neoplasm of corpus uteri, except isthmus ([place]) 05/16/2011    Percival Spanish, PT, MPT 12/16/2019, 12:30 PM  [facility] [address]  [place], [place], [address] Phone: [phone]   Fax:  [phone]  Name: [name] MRN: [id number] Date of Birth: 1940/11/27

## 2019-12-18 ENCOUNTER — Encounter: Payer: Medicare Other | Admitting: Physical Therapy

## 2019-12-19 ENCOUNTER — Encounter: Payer: Self-pay | Admitting: Family Medicine

## 2019-12-19 ENCOUNTER — Ambulatory Visit (INDEPENDENT_AMBULATORY_CARE_PROVIDER_SITE_OTHER): Payer: Medicare Other | Admitting: Family Medicine

## 2019-12-19 ENCOUNTER — Other Ambulatory Visit: Payer: Self-pay

## 2019-12-19 DIAGNOSIS — M5412 Radiculopathy, cervical region: Secondary | ICD-10-CM | POA: Diagnosis not present

## 2019-12-19 NOTE — Progress Notes (Signed)
[name] - 79 y.o. female MRN [id number]  Date of birth: Aug 27, 1940  SUBJECTIVE:  Including CC & ROS.  Chief Complaint  Patient presents with  . Follow-up    right shoulder    [name] is a 79 y.o. female that is following up for her neck pain and radicular type symptoms.  She feels her posture is much improved and her symptoms have improved overall.  She still gets ultra sensation in the fourth and fifth digit of the right hand from time to time.   Review of Systems See HPI   HISTORY: Past Medical, Surgical, Social, and Family History Reviewed & Updated per EMR.   Pertinent Historical Findings include:  Past Medical History:  Diagnosis Date  . Allergy   . Anxiety    takes ativan as needed  . Benign positional vertigo   . Bursitis of shoulder, left   . Cancer [facility])    Endometrial ca/Recurrence  . CKD (chronic kidney disease), stage III   . History of colonic polyps   . Hypercholesteremia   . Hypercholesterolemia   . Hypertension   . Lumbar pain   . Osteoarthritis   . Osteoporosis   . S/P radiation therapy July 29, 2010-Sep 06, 2010   External beam of pelvis  . S/P radiation therapy 09/15/10, 09/24/10, 09/28/2010   Intracavitary brachytherapy  . SBO (small bowel obstruction) ([place])   . Status post chemotherapy    carboplatin/paclitaxel x 6 rounds    Past Surgical History:  Procedure Laterality Date  . ABDOMINAL HYSTERECTOMY    . BREAST LUMPECTOMY  1993   Left breast, benign  . ROBOTIC ASSISTED LAPAROSCOPIC VAGINAL HYSTERECTOMY WITH FIBROID REMOVAL  July 2010   bilat. salpingo-oophorectomy    Family History  Problem Relation Age of Onset  . Breast cancer Mother   . Heart disease Father   . Breast cancer Sister   . Diabetes Sister   . Colon cancer Neg Hx   . Esophageal cancer Neg Hx   . Rectal cancer Neg Hx   . Stomach cancer Neg Hx     Social History   Socioeconomic History  . Marital status: Widowed    Spouse name: Not on file  . Number of  children: Not on file  . Years of education: Not on file  . Highest education level: Not on file  Occupational History  . Not on file  Tobacco Use  . Smoking status: Former Smoker    Packs/day: 1.00    Types: Cigarettes    Quit date: 05/12/1976    Years since quitting: 43.6  . Smokeless tobacco: Never Used  Vaping Use  . Vaping Use: Never used  Substance and Sexual Activity  . Alcohol use: No  . Drug use: No  . Sexual activity: Never  Other Topics Concern  . Not on file  Social History Narrative  . Not on file   Social Determinants of Health   Financial Resource Strain:   . Difficulty of Paying Living Expenses: Not on file  Food Insecurity:   . Worried About Charity fundraiser in the Last Year: Not on file  . Ran Out of Food in the Last Year: Not on file  Transportation Needs:   . Lack of Transportation (Medical): Not on file  . Lack of Transportation (Non-Medical): Not on file  Physical Activity:   . Days of Exercise per Week: Not on file  . Minutes of Exercise per Session: Not on file  Stress:   .  Feeling of Stress : Not on file  Social Connections:   . Frequency of Communication with Friends and Family: Not on file  . Frequency of Social Gatherings with Friends and Family: Not on file  . Attends Religious Services: Not on file  . Active Member of Clubs or Organizations: Not on file  . Attends Archivist Meetings: Not on file  . Marital Status: Not on file  Intimate Partner Violence:   . Fear of Current or Ex-Partner: Not on file  . Emotionally Abused: Not on file  . Physically Abused: Not on file  . Sexually Abused: Not on file     PHYSICAL EXAM:  VS: Ht 5\' 5"  (1.651 m)   Wt 150 lb (68 kg)   BMI 24.96 kg/m  Physical Exam Gen: NAD, alert, cooperative with exam, well-appearing  ASSESSMENT & PLAN:   Cervical radiculopathy She has noticed improvement of her pain as well as her posture.  Therapy seems to be helping significantly. -Can continue  physical therapy. -Counseled supportive care. -Could consider epidural if symptoms worsen.

## 2019-12-19 NOTE — Assessment & Plan Note (Signed)
She has noticed improvement of her pain as well as her posture.  Therapy seems to be helping significantly. -Can continue physical therapy. -Counseled supportive care. -Could consider epidural if symptoms worsen.

## 2019-12-23 ENCOUNTER — Ambulatory Visit: Payer: Medicare Other | Admitting: Physical Therapy

## 2020-01-13 ENCOUNTER — Ambulatory Visit: Payer: Medicare Other | Attending: Family Medicine | Admitting: Physical Therapy

## 2020-01-13 ENCOUNTER — Encounter: Payer: Self-pay | Admitting: Physical Therapy

## 2020-01-13 ENCOUNTER — Other Ambulatory Visit: Payer: Self-pay

## 2020-01-13 DIAGNOSIS — R293 Abnormal posture: Secondary | ICD-10-CM | POA: Diagnosis not present

## 2020-01-13 DIAGNOSIS — M5413 Radiculopathy, cervicothoracic region: Secondary | ICD-10-CM | POA: Diagnosis not present

## 2020-01-13 DIAGNOSIS — M6281 Muscle weakness (generalized): Secondary | ICD-10-CM | POA: Insufficient documentation

## 2020-01-13 DIAGNOSIS — M62838 Other muscle spasm: Secondary | ICD-10-CM | POA: Insufficient documentation

## 2020-01-13 DIAGNOSIS — M25511 Pain in right shoulder: Secondary | ICD-10-CM | POA: Diagnosis not present

## 2020-01-13 NOTE — Therapy (Addendum)
[facility] [address]  [place], [place], [address] Phone: [phone]   Fax:  [phone]  Physical Therapy Treatment / Discharge Summary  Patient Details  Name: [name] MRN: [id number] Date of Birth: [birth year] Referring Provider (PT): Clearance Coots, MD   Encounter Date: 01/13/2020   PT End of Session - 01/13/20 1316    Visit Number 16    Number of Visits 19    Date for PT Re-Evaluation 01/13/20    Authorization Type Medicare & [employer]    PT Start Time 1316    PT Stop Time 1355    PT Time Calculation (min) 39 min    Activity Tolerance Patient tolerated treatment well    Behavior During Therapy [facility] for tasks assessed/performed           Past Medical History:  Diagnosis Date  . Allergy   . Anxiety    takes ativan as needed  . Benign positional vertigo   . Bursitis of shoulder, left   . Cancer [facility])    Endometrial ca/Recurrence  . CKD (chronic kidney disease), stage III   . History of colonic polyps   . Hypercholesteremia   . Hypercholesterolemia   . Hypertension   . Lumbar pain   . Osteoarthritis   . Osteoporosis   . S/P radiation therapy July 29, 2010-Sep 06, 2010   External beam of pelvis  . S/P radiation therapy 09/15/10, 09/24/10, 09/28/2010   Intracavitary brachytherapy  . SBO (small bowel obstruction) ([place])   . Status post chemotherapy    carboplatin/paclitaxel x 6 rounds    Past Surgical History:  Procedure Laterality Date  . ABDOMINAL HYSTERECTOMY    . BREAST LUMPECTOMY  1993   Left breast, benign  . ROBOTIC ASSISTED LAPAROSCOPIC VAGINAL HYSTERECTOMY WITH FIBROID REMOVAL  July 2010   bilat. salpingo-oophorectomy    There were no vitals filed for this visit.   Subjective Assessment - 01/13/20 1318    Subjective Pt reports she has been doing well but does note some increased pain/stiffness in neck and R shoulder blade following a long car trip (5.5 hrs round trip in 1  day) over the weekend. Admits to limited compliance with HEP.    Pertinent History HTN, endometrial cancer, anxiety, BPPV, L shoulder bursitis, CKD, OA, osteoporosis    Diagnostic tests Cervical MRI 11/10/19: Multilevel spondylosis. Multilevel foraminal encroachment due to spurring throughout the cervical spine. Right foraminal disc protrusion at C7-T1 with right C8 nerve root impingement.   R shoulder x-ray 09/10/19 - Unremarkable    Patient Stated Goals "No pain"    Currently in Pain? Yes    Pain Score 2     Pain Location Scapula   + stiffness in neck   Pain Orientation Right    Pain Descriptors / Indicators Dull;Aching    Pain Type Chronic pain    Pain Radiating Towards intermittent numbness and tingling in 4th < 5th digits of R hand    Pain Onset More than a month ago    Pain Frequency Intermittent    Aggravating Factors  prolonged positioning while working or riding in car              [facility] PT Assessment - 01/13/20 1316      Assessment   Medical Diagnosis Cervical radiculopathy, R shoulder pain    Referring Provider (PT) Clearance Coots, MD    Next MD Visit PRN      AROM  Cervical Flexion 72    Cervical Extension 58    Cervical - Right Side Bend 32    Cervical - Left Side Bend 30    Cervical - Right Rotation 68    Cervical - Left Rotation 70      Strength   Right Shoulder Flexion 5/5    Right Shoulder ABduction 4+/5    Right Shoulder Internal Rotation 5/5    Right Shoulder External Rotation 4+/5    Left Shoulder Flexion 5/5    Left Shoulder ABduction 4+/5    Left Shoulder Internal Rotation 5/5    Left Shoulder External Rotation 4+/5                         [facility] Adult PT Treatment/Exercise - 01/13/20 1316      Neck Exercises: Seated   Cervical Rotation Right;10 reps    Cervical Rotation Limitations rotational SNAGs with pillowcase    Other Seated Exercise Thoracic extension mobs over back of chair 10 x 5"    Other Seated Exercise R 1st rib  mobilization + cervical rotation & side bending x 10 each      Hand Exercises for Cervical Radiculopathy   Other Hand Exercise for Cervical Radiculopathy Rt ulnar nerve glide x 10                    PT Short Term Goals - 10/31/19 1549      PT SHORT TERM GOAL #1   Title Patient will be independent with initial HEP    Status Achieved   10/02/19     PT SHORT TERM GOAL #2   Title Patient will verbalize/demonstrate understanding of neutral spine and shoulder posture and proper body mechanics to reduce strain on cervical spine    Status Achieved   10/31/19            PT Long Term Goals - 01/13/20 1325      PT LONG TERM GOAL #1   Title Patient will be independent with ongoing/advanced HEP    Status Achieved   01/13/20     PT LONG TERM GOAL #2   Title Patient to demonstrate ability to achieve and maintain good spinal and shoulder alignment/posturing    Status Achieved   12/16/19     PT LONG TERM GOAL #3   Title Patient to report pain reduction in frequency and intensity by >/= 50%    Status Achieved   10/14/19 - 75% improved; 12/16/19 - 85% improved     PT LONG TERM GOAL #4   Title Patient to improve cervical AROM to [facility] without pain provocation    Status Achieved   12/16/19     PT LONG TERM GOAL #5   Title Patient will demonstrate improved B shoulder strength to >/= 4+/5 for functional UE use    Status Achieved   12/16/19     PT LONG TERM GOAL #6   Title Patient to report ability to perform work and daily activities without pain provocation    Status Achieved   01/13/20                Plan - 01/13/20 1327    Clinical Impression Statement [name] returning for first time in a month, noting pain has been pretty well controlled but did note some increased pain and stiffness following 5.5 hrs in the car over the weekend. She reports continued awareness of posture and finds that more frequent  position changes help keep her from falling back into bad postural habits.  Cervical ROM remains WFL with further improvements noted in flexion and extension ROM and no pain reported. B shoulder strength also continues to improve. She does admit to limited compliance with her HEP, therefore session focusing on HEP review to ensure proper performance of HEP exercises with pt able to provide good return demonstration after review. All goals now met and pt ready to transition to HEP but would like to remain on hold for 30-days in the event that issues arise with HEP.    Comorbidities HTN, endometrial cancer, anxiety, BPPV, L shoulder bursitis, CKD, OA, osteoporosis    Rehab Potential Good    PT Treatment/Interventions ADLs/Self Care Home Management;Cryotherapy;Electrical Stimulation;Iontophoresis 17m/ml Dexamethasone;Moist Heat;Ultrasound;Functional mobility training;Therapeutic activities;Therapeutic exercise;Neuromuscular re-education;Patient/family education;Manual techniques;Passive range of motion;Dry needling;Taping;Spinal Manipulations;Joint Manipulations    PT Next Visit Plan 30-day hold    PT Home Exercise Plan 5/28 - cervical retraction, UT and 3-way doorway pec stretches, scap retraction & depression; 6/21 - LS stretch, hooklying yellow TB scap retraction + B shoulder horiz ABD and shoulder ER; 8/11 - thoracic extension over back of chair, cervical rotation SNAGs, 1st rib mob + cervical rotation & side bending; 8/23 - ulnar nerve glide    Consulted and Agree with Plan of Care Patient           Patient will benefit from skilled therapeutic intervention in order to improve the following deficits and impairments:  Decreased activity tolerance, Decreased knowledge of precautions, Decreased range of motion, Decreased strength, Hypomobility, Increased fascial restricitons, Increased muscle spasms, Impaired perceived functional ability, Impaired flexibility, Impaired UE functional use, Improper body mechanics, Postural dysfunction, Pain  Visit Diagnosis: Radiculopathy,  cervicothoracic region  Abnormal posture  Other muscle spasm  Acute pain of right shoulder  Muscle weakness (generalized)     Problem List Patient Active Problem List   Diagnosis Date Noted  . Acute pain of right shoulder 09/30/2019  . Myofascial pain 09/18/2019  . Cervical radiculopathy 09/12/2019  . Essential hypertension, benign 05/18/2013  . [place] ([place]) ([place] 05/17/2013  . [place] 05/16/2013  . Malignant neoplasm of corpus uteri, except isthmus [place] 05/16/2011    JPercival Spanish PT, MPT 01/13/2020, 4:58 PM  [facility] [place] [address]Phone: [phone]  Fax:  [phone] Name: Jessica BandMRN: [id number] of Birth: 811-08-1940 PHYSICAL THERAPY DISCHARGE SUMMARY  Visits from Start of Care: 16  Current functional level related to goals / functional outcomes:   Refer to above clinical impression for status as of last visit on 01/13/2020. Patient was placed on hold for 30 days and has not needed to return to PT, therefore will proceed with discharge from PT for this episode.   Remaining deficits:   As above.    Education / Equipment:   HEP, pBiomedical scientist Plan: Patient agrees to discharge.  Patient goals were met. Patient is being discharged due to meeting the stated rehab goals.  ?????     JPercival Spanish PT, MPT 02/28/20, 9:14 AM  [facility] [place] [address]Phone: [phone]  Fax:  [phone]

## 2020-01-18 DIAGNOSIS — Z23 Encounter for immunization: Secondary | ICD-10-CM | POA: Diagnosis not present

## 2020-01-25 DIAGNOSIS — Z23 Encounter for immunization: Secondary | ICD-10-CM | POA: Diagnosis not present

## 2020-02-07 DIAGNOSIS — Z803 Family history of malignant neoplasm of breast: Secondary | ICD-10-CM | POA: Diagnosis not present

## 2020-02-07 DIAGNOSIS — Z1231 Encounter for screening mammogram for malignant neoplasm of breast: Secondary | ICD-10-CM | POA: Diagnosis not present

## 2020-02-28 ENCOUNTER — Ambulatory Visit: Payer: Medicare Other | Admitting: Family Medicine

## 2020-02-28 ENCOUNTER — Telehealth: Payer: Self-pay | Admitting: Family Medicine

## 2020-02-28 DIAGNOSIS — M25511 Pain in right shoulder: Secondary | ICD-10-CM

## 2020-02-28 NOTE — Telephone Encounter (Signed)
Placed referral to physical therapy.   Rosemarie Ax, MD [facility] 02/28/2020, 1:35 PM

## 2020-02-28 NOTE — Telephone Encounter (Signed)
Pt came 72min late for appt 11/5,rescheduling if OV required.  -- Patient has reoccurring Shoulder pain & wants to return to Physical therapy but was advised Dr. Raeford Razor would have to issue New Order per OP rehab rep.  --Forwarding request to provider for review & to determine if OV needed for patient evaluation prior to returning to PT.  --glh

## 2020-03-04 ENCOUNTER — Other Ambulatory Visit (HOSPITAL_COMMUNITY): Payer: Self-pay | Admitting: *Deleted

## 2020-03-05 ENCOUNTER — Other Ambulatory Visit: Payer: Self-pay

## 2020-03-05 ENCOUNTER — Ambulatory Visit (HOSPITAL_COMMUNITY)
Admission: RE | Admit: 2020-03-05 | Discharge: 2020-03-05 | Disposition: A | Discharge status: Home / Self Care | Payer: Medicare Other | Source: Ambulatory Visit | Attending: Internal Medicine | Admitting: Internal Medicine

## 2020-03-05 DIAGNOSIS — M810 Age-related osteoporosis without current pathological fracture: Secondary | ICD-10-CM | POA: Insufficient documentation

## 2020-03-05 MED ORDER — DENOSUMAB 60 MG/ML ~~LOC~~ SOSY
60.0000 mg | PREFILLED_SYRINGE | Freq: Once | SUBCUTANEOUS | Status: AC
  Administered 2020-03-05: 60 mg via SUBCUTANEOUS

## 2020-03-05 MED ORDER — DENOSUMAB 60 MG/ML ~~LOC~~ SOSY
PREFILLED_SYRINGE | SUBCUTANEOUS | Status: AC
  Filled 2020-03-05: qty 1

## 2020-03-23 ENCOUNTER — Encounter: Payer: Self-pay | Admitting: Physical Therapy

## 2020-03-23 ENCOUNTER — Other Ambulatory Visit: Payer: Self-pay

## 2020-03-23 ENCOUNTER — Ambulatory Visit: Payer: Medicare Other | Attending: Family Medicine | Admitting: Physical Therapy

## 2020-03-23 DIAGNOSIS — M62838 Other muscle spasm: Secondary | ICD-10-CM | POA: Diagnosis not present

## 2020-03-23 DIAGNOSIS — M25511 Pain in right shoulder: Secondary | ICD-10-CM | POA: Diagnosis not present

## 2020-03-23 DIAGNOSIS — R293 Abnormal posture: Secondary | ICD-10-CM | POA: Insufficient documentation

## 2020-03-23 DIAGNOSIS — M6281 Muscle weakness (generalized): Secondary | ICD-10-CM | POA: Insufficient documentation

## 2020-03-23 DIAGNOSIS — M5413 Radiculopathy, cervicothoracic region: Secondary | ICD-10-CM | POA: Diagnosis not present

## 2020-03-23 NOTE — Therapy (Signed)
[facility] [address]  [place], [place], [address] Phone: [phone]   Fax:  [phone]  Physical Therapy Evaluation  Patient Details  Name: [name] MRN: [id number] Date of Birth: 06/02/1940 Referring Provider (PT): Clearance Coots, MD   Encounter Date: 03/23/2020   PT End of Session - 03/23/20 0933    Visit Number 1    Number of Visits 8    Date for PT Re-Evaluation 05/18/20    Authorization Type Medicare & [employer]    PT Start Time [phone]    PT Stop Time 1016    PT Time Calculation (min) 43 min    Activity Tolerance Patient tolerated treatment well    Behavior During Therapy [facility] for tasks assessed/performed           Past Medical History:  Diagnosis Date  . Allergy   . Anxiety    takes ativan as needed  . Benign positional vertigo   . Bursitis of shoulder, left   . Cancer [facility])    Endometrial ca/Recurrence  . CKD (chronic kidney disease), stage III ([place])   . History of colonic polyps   . Hypercholesteremia   . Hypercholesterolemia   . Hypertension   . Lumbar pain   . Osteoarthritis   . Osteoporosis   . S/P radiation therapy July 29, 2010-Sep 06, 2010   External beam of pelvis  . S/P radiation therapy 09/15/10, 09/24/10, 09/28/2010   Intracavitary brachytherapy  . SBO (small bowel obstruction) ([place])   . Status post chemotherapy    carboplatin/paclitaxel x 6 rounds    Past Surgical History:  Procedure Laterality Date  . ABDOMINAL HYSTERECTOMY    . BREAST LUMPECTOMY  1993   Left breast, benign  . ROBOTIC ASSISTED LAPAROSCOPIC VAGINAL HYSTERECTOMY WITH FIBROID REMOVAL  July 2010   bilat. salpingo-oophorectomy    There were no vitals filed for this visit.    Subjective Assessment - 03/23/20 0941    Subjective Pt reports oning intermittent pain in R shoulder as well as numbness in 4th & 5th digits. Aggravated today after working in yard yesterday. Also notes ongoing sitffness in  her neck. Typically manages pain with OTC meds. Notes increased stress in her life due to medical issues with her sisters.    Pertinent History HTN, endometrial cancer, anxiety, BPPV, L shoulder bursitis, CKD, OA, osteoporosis    Diagnostic tests Cervical MRI 11/10/19: Multilevel spondylosis. Multilevel foraminal encroachment due to spurring throughout the cervical spine. Right foraminal disc protrusion at C7-T1 with right C8 nerve root impingement.   R shoulder x-ray 09/10/19 - Unremarkable    Patient Stated Goals "to do normal activities w/o a bunch of pain"    Currently in Pain? Yes    Pain Score 3    2-3/10   Pain Location Shoulder    Pain Orientation Right;Upper;Posterior    Pain Descriptors / Indicators Aching    Pain Type Acute pain;Chronic pain    Pain Radiating Towards pain into upper arm & intermittent numbness into R 4th & 5th digits    Pain Onset 1 to 4 weeks ago   chronic with acute exacerbation for past 3-4 weeks   Pain Frequency Intermittent    Aggravating Factors  yardwork, overuse of R arm, heavy lifting or dragging heavy objects    Pain Relieving Factors OTC pain meds    Effect of Pain on Daily Activities no major limitations  [facility] PT Assessment - 03/23/20 0933      Assessment   Medical Diagnosis R shoulder pain    Referring Provider (PT) Clearance Coots, MD    Onset Date/Surgical Date 02/28/20   acute on chronic - exacerbation ~3-4 weeks ago   Next MD Visit PRN    Prior Therapy PT earlier this year for cervical radiculopathy & R shoulder pain      Precautions   Precautions None      Restrictions   Weight Bearing Restrictions No      Balance Screen   Has the patient fallen in the past 6 months No    Has the patient had a decrease in activity level because of a fear of falling?  No    Is the patient reluctant to leave their home because of a fear of falling?  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    Leisure gardening      Cognition   Overall Cognitive Status Within Functional Limits for tasks assessed      Observation/Other Assessments   Focus on Therapeutic Outcomes (FOTO)  Shoulder - 60% (40% limitation); Predicted 64% (36% limitation)      Posture/Postural Control   Posture/Postural Control Postural limitations    Postural Limitations Forward head;Rounded Shoulders;Increased thoracic kyphosis    Posture Comments concave chest; R shoduler elevated & protracted      AROM   Right/Left Shoulder Right;Left   WFL   Cervical Flexion 64    Cervical Extension 48 - tight    Cervical - Right Side Bend 22    Cervical - Left Side Bend 20    Cervical - Right Rotation 62 - stiff    Cervical - Left Rotation 60 - stiff      Strength   Right Shoulder Flexion 4+/5    Right Shoulder ABduction 4+/5    Right Shoulder Internal Rotation 4+/5    Right Shoulder External Rotation 4+/5    Left Shoulder Flexion 5/5    Left Shoulder ABduction 5/5    Left Shoulder Internal Rotation 5/5    Left Shoulder External Rotation 4+/5      Palpation   Palpation comment increased muscle tension and ttp in R>L pecs, UT, LS and rhomboids                       Objective measurements completed on examination: See above findings.               PT Education - 03/23/20 1015    Education Details PT eval findings, anticipated POC, brief review of prior HEP clarifiying need for longer hold times for stretches    Person(s) Educated Patient    Methods Explanation    Comprehension Verbalized understanding            PT  Short Term Goals - 03/23/20 1016      PT SHORT TERM GOAL #1   Title Patient will be independent with initial HEP  Status New    Target Date 04/20/20             PT Long Term Goals - 03/23/20 1016      PT LONG TERM GOAL #1   Title Patient will be independent with ongoing/advanced HEP for self-management at home    Status New    Target Date 05/18/20      PT LONG TERM GOAL #2   Title Patient to demonstrate ability to routinely achieve and maintain good spinal and shoulder alignment/posturing    Status New    Target Date 05/18/20      PT LONG TERM GOAL #3   Title Patient to report R shoulder pain reduction in frequency and intensity by >/= 50%    Status New    Target Date 05/18/20      PT LONG TERM GOAL #4   Title Patient will demonstrate improved R shoulder strength to >/= 5-/5 for functional UE use    Status New    Target Date 05/18/20      PT LONG TERM GOAL #5   Title Patient to report ability to perform work and daily activities without pain provocation    Status New    Target Date 05/18/20                  Plan - 03/23/20 1016    Clinical Impression Statement [name] is an 79 y/o female who presents to OP PT with recurrence of acute/chronic R shoulder and intermittent cervical radiculopathy in ulnar nerve distribution. Pt has previously completed a PT episode earlier this year for the same/similar problem with good resolution of pain, but notes return of intermittent pain in R shoulder along with intermittent numbness and tingling into R 4th & 5th digits ~3-4 weeks ago with unknown trigger/MOI. Pain has subsided somewhat since recurrence but was more recently aggravated by working in her yard moving heavy planters yesterday. She reports she continues to perform her prior HEP intermittently with more emphasis on scapular retraction and admits to probably not performing stretches with intended hold times. Deficits include intermittent R shoulder upper arm pain,  intermittent ulnar nerve distribution radiculopathy, forward head and rounded shoulder posture with increased thoracic kyphosis and concave chest along with R shoulder elevation and scapular protraction, neck stiffness/decreased cervical ROM and mild decreased R UE strength. [place] will benefit from skilled PT to address above deficits and promote neutral spinal and shoulder posture to decrease R shoulder pain and UE radiculopathy to decrease pain interference with daily household, work and recreational activities.    Personal Factors and Comorbidities Age;Comorbidity 3+;Fitness;Past/Current Experience;Profession;Time since onset of injury/illness/exacerbation    Comorbidities HTN, endometrial cancer, anxiety, BPPV, L shoulder bursitis, CKD, OA, osteoporosis    Examination-Activity Limitations Lift;Carry;Reach Overhead    Examination-Participation Restrictions Cleaning;Community Activity;Driving;Laundry;Meal Prep;Occupation;Yard Work    Stability/Clinical Decision Making Stable/Uncomplicated    Clinical Decision Making Low    Rehab Potential Good    PT Frequency 1x / week    PT Duration 8 weeks    PT Treatment/Interventions ADLs/Self Care Home Management;Cryotherapy;Electrical Stimulation;Iontophoresis 4mg /ml Dexamethasone;Moist Heat;Ultrasound;Functional mobility training;Therapeutic activities;Therapeutic exercise;Neuromuscular re-education;Patient/family education;Manual techniques;Passive range of motion;Dry needling;Taping;Spinal Manipulations;Joint Manipulations    PT Next Visit Plan Review & update prior HEP as indicated, postural stretching & strengthening; manual therapy with DN as indicated to address increased muscle tension/pain; modalities PRN    Consulted and Agree with Plan of Care Patient           Patient will benefit  from skilled therapeutic intervention in order to improve the following deficits and impairments:  Decreased activity tolerance, Decreased knowledge of precautions,  Decreased range of motion, Decreased strength, Hypomobility, Increased fascial restricitons, Increased muscle spasms, Impaired perceived functional ability, Impaired flexibility, Impaired UE functional use, Improper body mechanics, Postural dysfunction, Pain  Visit Diagnosis: Acute pain of right shoulder  Radiculopathy, cervicothoracic region  Abnormal posture  Other muscle spasm  Muscle weakness (generalized)     Problem List Patient Active Problem List   Diagnosis Date Noted  . Acute pain of right shoulder 09/30/2019  . Myofascial pain 09/18/2019  . Cervical radiculopathy 09/12/2019  . Essential hypertension, benign 05/18/2013  . SBO (small bowel obstruction) ([place]) 05/17/2013  . Small bowel obstruction ([place]) 05/16/2013  . Malignant neoplasm of corpus uteri, except isthmus ([place]) 05/16/2011    Percival Spanish, PT, MPT 03/23/2020, 11:50 AM  [facility] [address]  [place], [place], [address] Phone: [phone]   Fax:  [phone]  Name: [name] MRN: [id number] Date of Birth: 07-16-1940

## 2020-03-31 ENCOUNTER — Encounter: Payer: Self-pay | Admitting: Physical Therapy

## 2020-03-31 ENCOUNTER — Other Ambulatory Visit: Payer: Self-pay

## 2020-03-31 ENCOUNTER — Ambulatory Visit: Payer: Medicare Other | Attending: Family Medicine | Admitting: Physical Therapy

## 2020-03-31 DIAGNOSIS — R293 Abnormal posture: Secondary | ICD-10-CM | POA: Diagnosis not present

## 2020-03-31 DIAGNOSIS — M5413 Radiculopathy, cervicothoracic region: Secondary | ICD-10-CM | POA: Insufficient documentation

## 2020-03-31 DIAGNOSIS — M25511 Pain in right shoulder: Secondary | ICD-10-CM | POA: Diagnosis not present

## 2020-03-31 DIAGNOSIS — M6281 Muscle weakness (generalized): Secondary | ICD-10-CM | POA: Diagnosis not present

## 2020-03-31 DIAGNOSIS — M62838 Other muscle spasm: Secondary | ICD-10-CM | POA: Insufficient documentation

## 2020-03-31 NOTE — Therapy (Addendum)
[facility] [place] [address]  [place], [place], [address] Phone: [phone]   Fax:  [phone]  Physical Therapy Treatment / Discharge Summary  Patient Details  Name: [name] MRN: [id number] Date of Birth: 08-26-1940 Referring Provider (PT): Clearance Coots, MD   Encounter Date: 03/31/2020   PT End of Session - 03/31/20 0853    Visit Number 2    Number of Visits 8    Date for PT Re-Evaluation 05/18/20    Authorization Type Medicare & [employer]    PT Start Time [phone]    PT Stop Time 0932    PT Time Calculation (min) 39 min    Activity Tolerance Patient tolerated treatment well    Behavior During Therapy [facility] for tasks assessed/performed           Past Medical History:  Diagnosis Date  . Allergy   . Anxiety    takes ativan as needed  . Benign positional vertigo   . Bursitis of shoulder, left   . Cancer [facility])    Endometrial ca/Recurrence  . CKD (chronic kidney disease), stage III ([place])   . History of colonic polyps   . Hypercholesteremia   . Hypercholesterolemia   . Hypertension   . Lumbar pain   . Osteoarthritis   . Osteoporosis   . S/P radiation therapy July 29, 2010-Sep 06, 2010   External beam of pelvis  . S/P radiation therapy 09/15/10, 09/24/10, 09/28/2010   Intracavitary brachytherapy  . SBO (small bowel obstruction) ([place])   . Status post chemotherapy    carboplatin/paclitaxel x 6 rounds    Past Surgical History:  Procedure Laterality Date  . ABDOMINAL HYSTERECTOMY    . BREAST LUMPECTOMY  1993   Left breast, benign  . ROBOTIC ASSISTED LAPAROSCOPIC VAGINAL HYSTERECTOMY WITH FIBROID REMOVAL  July 2010   bilat. salpingo-oophorectomy    There were no vitals filed for this visit.   Subjective Assessment - 03/31/20 0857    Subjective Pt denies pain today but notes she feels stressed due to her sisters' health issuse.    Pertinent History HTN, endometrial cancer, anxiety, BPPV, L  shoulder bursitis, CKD, OA, osteoporosis    Diagnostic tests Cervical MRI 11/10/19: Multilevel spondylosis. Multilevel foraminal encroachment due to spurring throughout the cervical spine. Right foraminal disc protrusion at C7-T1 with right C8 nerve root impingement.   R shoulder x-ray 09/10/19 - Unremarkable    Patient Stated Goals "to do normal activities w/o a bunch of pain"    Currently in Pain? No/denies                             [facility] Adult PT Treatment/Exercise - 03/31/20 0853      Exercises   Exercises Shoulder      Neck Exercises: Seated   Neck Retraction 10 reps;5 secs    Neck Retraction Limitations fingertips on chin to guide motion      Shoulder Exercises: Seated   Retraction Both;10 reps;AROM;Strengthening    Retraction Limitations scap retraction + depression      Shoulder Exercises: Standing   Row Both;10 reps;Strengthening;Theraband    Theraband Level (Shoulder Row) Level 2 (Red)    Retraction Both;10 reps;Strengthening;Theraband    Theraband Level (Shoulder Retraction) Level 2 (Red)    Retraction Limitations + mini-shoulder extension, cues for scap retraction & depression keeping elbows straight and pulling down to sides of legs  Shoulder Exercises: ROM/Strengthening   UBE (Upper Arm Bike) L1.5 x 6 min (3' fwd/3' back)      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;3 reps    Corner Stretch Limitations low & high doorway pec stretch - deferred mid position as it caused her to arch her back excessively      Neck Exercises: Stretches   Upper Trapezius Stretch Right;Left;30 seconds;1 rep    Upper Trapezius Stretch Limitations hand under hip to anchor shoulder down    Levator Stretch Right;Left;30 seconds;1 rep    Levator Stretch Limitations hand behind hip                   PT Education - 03/31/20 0936    Education Details HEP review - new handout provided targeting main exercises to initially focus on    Person(s)  Educated Patient    Methods Explanation;Demonstration;Verbal cues;Tactile cues;Handout    Comprehension Verbalized understanding;Verbal cues required;Tactile cues required;Returned demonstration;Need further instruction            PT Short Term Goals - 03/31/20 0858      PT SHORT TERM GOAL #1   Title Patient will be independent with initial HEP    Status On-going    Target Date 04/20/20             PT Long Term Goals - 03/31/20 0858      PT LONG TERM GOAL #1   Title Patient will be independent with ongoing/advanced HEP for self-management at home    Status On-going    Target Date 05/18/20      PT LONG TERM GOAL #2   Title Patient to demonstrate ability to routinely achieve and maintain good spinal and shoulder alignment/posturing    Status On-going    Target Date 05/18/20      PT LONG TERM GOAL #3   Title Patient to report R shoulder pain reduction in frequency and intensity by >/= 50%    Status On-going    Target Date 05/18/20      PT LONG TERM GOAL #4   Title Patient will demonstrate improved R shoulder strength to >/= 5-/5 for functional UE use    Status On-going    Target Date 05/18/20      PT LONG TERM GOAL #5   Title Patient to report ability to perform work and daily activities without pain provocation    Status On-going    Target Date 05/18/20                 Plan - 03/31/20 0900    Clinical Impression Statement Jazzelle denies pain today but still notes tightness/tension which she attributes to stress from concern about her sisters' health issues. HEPs from prior PT episode and MD reviewed and condensed into single handout after ensuring pt able to perform exercises and stretches correctly w/o pain. Limited tolerance for mid doorway pec stretch as it caused her to excessively extend in her lumbar spine, therefore only low & high positions included in HEP.    Comorbidities HTN, endometrial cancer, anxiety, BPPV, L shoulder bursitis, CKD, OA, osteoporosis     Rehab Potential Good    PT Frequency 1x / week    PT Duration 8 weeks    PT Treatment/Interventions ADLs/Self Care Home Management;Cryotherapy;Electrical Stimulation;Iontophoresis 19m/ml Dexamethasone;Moist Heat;Ultrasound;Functional mobility training;Therapeutic activities;Therapeutic exercise;Neuromuscular re-education;Patient/family education;Manual techniques;Passive range of motion;Dry needling;Taping;Spinal Manipulations;Joint Manipulations    PT Next Visit Plan Review HEP as indicated, postural stretching &  strengthening; manual therapy with DN as indicated to address increased muscle tension/pain; modalities PRN    Consulted and Agree with Plan of Care Patient           Patient will benefit from skilled therapeutic intervention in order to improve the following deficits and impairments:  Decreased activity tolerance, Decreased knowledge of precautions, Decreased range of motion, Decreased strength, Hypomobility, Increased fascial restricitons, Increased muscle spasms, Impaired perceived functional ability, Impaired flexibility, Impaired UE functional use, Improper body mechanics, Postural dysfunction, Pain  Visit Diagnosis: Acute pain of right shoulder  Radiculopathy, cervicothoracic region  Abnormal posture  Other muscle spasm  Muscle weakness (generalized)     Problem List Patient Active Problem List   Diagnosis Date Noted  . Acute pain of right shoulder 09/30/2019  . Myofascial pain 09/18/2019  . Cervical radiculopathy 09/12/2019  . Essential hypertension, benign 05/18/2013  . SBO (small bowel obstruction) ([place]) 05/17/2013  . Small bowel obstruction ([place]) 05/16/2013  . Malignant neoplasm of corpus uteri, except isthmus ([place]) 05/16/2011    Percival Spanish, PT, MPT 03/31/2020, 9:43 AM  [facility] [address]  [place], [place], [address] Phone: [phone]   Fax:  [phone]  Name: Arianny Pun MRN: [id number] Date of Birth: Jan 11, 1941   PHYSICAL THERAPY DISCHARGE SUMMARY  Visits from Start of Care: 2  Current functional level related to goals / functional outcomes:   Refer to above clinical impression for status as of last visit on 03/31/2020. Patient returned for only 1 treatment visit following the eval and has not returned to PT in >30 days, therefore will proceed with discharge from PT for this episode.   Remaining deficits:   As above. Unable to formally assess status as of discharge due to failure to return to PT.   Education / Equipment:   HEP  Plan: Patient agrees to discharge.  Patient goals were not met. Patient is being discharged due to not returning since the last visit.  ?????     Percival Spanish, PT, MPT 05/12/20, 2:55 PM  [facility] [address]  [place], [place], [address] Phone: [phone]   Fax:  [phone]

## 2020-03-31 NOTE — Patient Instructions (Signed)
    Home exercise program created by Anyi Fels, PT.  For questions, please contact Jyaire Koudelka via phone at [phone] or email at [email or web address]   [facility] [place] [address]  [address] [place], [place], [address] Phone: [phone]   Fax:  [phone]    

## 2020-04-02 DIAGNOSIS — E1129 Type 2 diabetes mellitus with other diabetic kidney complication: Secondary | ICD-10-CM | POA: Diagnosis not present

## 2020-04-02 DIAGNOSIS — E7800 Pure hypercholesterolemia, unspecified: Secondary | ICD-10-CM | POA: Diagnosis not present

## 2020-04-02 DIAGNOSIS — M810 Age-related osteoporosis without current pathological fracture: Secondary | ICD-10-CM | POA: Diagnosis not present

## 2020-04-07 ENCOUNTER — Ambulatory Visit: Payer: Medicare Other | Admitting: Physical Therapy

## 2020-04-08 DIAGNOSIS — E7800 Pure hypercholesterolemia, unspecified: Secondary | ICD-10-CM | POA: Diagnosis not present

## 2020-04-08 DIAGNOSIS — M810 Age-related osteoporosis without current pathological fracture: Secondary | ICD-10-CM | POA: Diagnosis not present

## 2020-04-08 DIAGNOSIS — F418 Other specified anxiety disorders: Secondary | ICD-10-CM | POA: Diagnosis not present

## 2020-04-08 DIAGNOSIS — Z0000 Encounter for general adult medical examination without abnormal findings: Secondary | ICD-10-CM | POA: Diagnosis not present

## 2020-04-08 DIAGNOSIS — R82998 Other abnormal findings in urine: Secondary | ICD-10-CM | POA: Diagnosis not present

## 2020-04-08 DIAGNOSIS — Z1212 Encounter for screening for malignant neoplasm of rectum: Secondary | ICD-10-CM | POA: Diagnosis not present

## 2020-04-08 DIAGNOSIS — Z8542 Personal history of malignant neoplasm of other parts of uterus: Secondary | ICD-10-CM | POA: Diagnosis not present

## 2020-04-08 DIAGNOSIS — R809 Proteinuria, unspecified: Secondary | ICD-10-CM | POA: Diagnosis not present

## 2020-04-08 DIAGNOSIS — I129 Hypertensive chronic kidney disease with stage 1 through stage 4 chronic kidney disease, or unspecified chronic kidney disease: Secondary | ICD-10-CM | POA: Diagnosis not present

## 2020-04-08 DIAGNOSIS — D509 Iron deficiency anemia, unspecified: Secondary | ICD-10-CM | POA: Diagnosis not present

## 2020-04-08 DIAGNOSIS — E1129 Type 2 diabetes mellitus with other diabetic kidney complication: Secondary | ICD-10-CM | POA: Diagnosis not present

## 2020-04-08 DIAGNOSIS — N1831 Chronic kidney disease, stage 3a: Secondary | ICD-10-CM | POA: Diagnosis not present

## 2020-04-08 DIAGNOSIS — S329XXS Fracture of unspecified parts of lumbosacral spine and pelvis, sequela: Secondary | ICD-10-CM | POA: Diagnosis not present

## 2020-04-21 ENCOUNTER — Ambulatory Visit: Payer: Medicare Other | Admitting: Physical Therapy

## 2020-05-12 ENCOUNTER — Telehealth: Payer: Self-pay | Admitting: Family Medicine

## 2020-05-12 NOTE — Telephone Encounter (Signed)
Called pt to give update on injection status-- left message [employer] approved & we can schedule her appt whenever she wishes.  --glh

## 2020-06-08 ENCOUNTER — Telehealth: Payer: Self-pay | Admitting: *Deleted

## 2020-06-08 NOTE — Telephone Encounter (Signed)
Called pt to see if she was wanting to come in for an Evenity injection. Her insurance does not require prior authorization and Dr Raeford Razor was wanting her to switch from [place] to [place]. Per the patient, her PCP was her to continue on Prolia, which she is getting those injections at his office every 6 months.

## 2020-08-15 DIAGNOSIS — Z23 Encounter for immunization: Secondary | ICD-10-CM | POA: Diagnosis not present

## 2020-09-01 ENCOUNTER — Other Ambulatory Visit (HOSPITAL_COMMUNITY): Payer: Self-pay | Admitting: *Deleted

## 2020-09-02 ENCOUNTER — Ambulatory Visit (HOSPITAL_COMMUNITY)
Admission: RE | Admit: 2020-09-02 | Discharge: 2020-09-02 | Disposition: A | Discharge status: Home / Self Care | Payer: Medicare Other | Source: Ambulatory Visit | Attending: Internal Medicine | Admitting: Internal Medicine

## 2020-09-02 ENCOUNTER — Other Ambulatory Visit: Payer: Self-pay

## 2020-09-02 DIAGNOSIS — M810 Age-related osteoporosis without current pathological fracture: Secondary | ICD-10-CM | POA: Diagnosis not present

## 2020-09-02 MED ORDER — DENOSUMAB 60 MG/ML ~~LOC~~ SOSY
60.0000 mg | PREFILLED_SYRINGE | Freq: Once | SUBCUTANEOUS | Status: AC
  Administered 2020-09-02: 60 mg via SUBCUTANEOUS

## 2020-09-02 MED ORDER — DENOSUMAB 60 MG/ML ~~LOC~~ SOSY
PREFILLED_SYRINGE | SUBCUTANEOUS | Status: AC
  Filled 2020-09-02: qty 1

## 2020-09-17 DIAGNOSIS — L821 Other seborrheic keratosis: Secondary | ICD-10-CM | POA: Diagnosis not present

## 2020-09-17 DIAGNOSIS — L570 Actinic keratosis: Secondary | ICD-10-CM | POA: Diagnosis not present

## 2020-09-17 DIAGNOSIS — L578 Other skin changes due to chronic exposure to nonionizing radiation: Secondary | ICD-10-CM | POA: Diagnosis not present

## 2020-09-17 DIAGNOSIS — I872 Venous insufficiency (chronic) (peripheral): Secondary | ICD-10-CM | POA: Diagnosis not present

## 2020-09-17 DIAGNOSIS — X32XXXS Exposure to sunlight, sequela: Secondary | ICD-10-CM | POA: Diagnosis not present

## 2020-09-17 DIAGNOSIS — L814 Other melanin hyperpigmentation: Secondary | ICD-10-CM | POA: Diagnosis not present

## 2020-09-17 DIAGNOSIS — L820 Inflamed seborrheic keratosis: Secondary | ICD-10-CM | POA: Diagnosis not present

## 2020-09-17 DIAGNOSIS — L720 Epidermal cyst: Secondary | ICD-10-CM | POA: Diagnosis not present

## 2020-10-09 DIAGNOSIS — E1129 Type 2 diabetes mellitus with other diabetic kidney complication: Secondary | ICD-10-CM | POA: Diagnosis not present

## 2020-10-09 DIAGNOSIS — E7800 Pure hypercholesterolemia, unspecified: Secondary | ICD-10-CM | POA: Diagnosis not present

## 2020-10-09 DIAGNOSIS — E46 Unspecified protein-calorie malnutrition: Secondary | ICD-10-CM | POA: Diagnosis not present

## 2020-10-09 DIAGNOSIS — F418 Other specified anxiety disorders: Secondary | ICD-10-CM | POA: Diagnosis not present

## 2020-10-09 DIAGNOSIS — M810 Age-related osteoporosis without current pathological fracture: Secondary | ICD-10-CM | POA: Diagnosis not present

## 2020-10-09 DIAGNOSIS — R809 Proteinuria, unspecified: Secondary | ICD-10-CM | POA: Diagnosis not present

## 2020-10-09 DIAGNOSIS — D509 Iron deficiency anemia, unspecified: Secondary | ICD-10-CM | POA: Diagnosis not present

## 2020-10-09 DIAGNOSIS — I129 Hypertensive chronic kidney disease with stage 1 through stage 4 chronic kidney disease, or unspecified chronic kidney disease: Secondary | ICD-10-CM | POA: Diagnosis not present

## 2020-10-09 DIAGNOSIS — M1990 Unspecified osteoarthritis, unspecified site: Secondary | ICD-10-CM | POA: Diagnosis not present

## 2020-10-09 DIAGNOSIS — C541 Malignant neoplasm of endometrium: Secondary | ICD-10-CM | POA: Diagnosis not present

## 2020-10-09 DIAGNOSIS — N1831 Chronic kidney disease, stage 3a: Secondary | ICD-10-CM | POA: Diagnosis not present

## 2020-10-23 ENCOUNTER — Other Ambulatory Visit: Payer: Self-pay

## 2020-10-23 ENCOUNTER — Emergency Department (HOSPITAL_BASED_OUTPATIENT_CLINIC_OR_DEPARTMENT_OTHER)
Admission: EM | Admit: 2020-10-23 | Discharge: 2020-10-23 | Disposition: A | Discharge status: Home / Self Care | Payer: Medicare Other | Attending: Emergency Medicine | Admitting: Emergency Medicine

## 2020-10-23 ENCOUNTER — Emergency Department (HOSPITAL_BASED_OUTPATIENT_CLINIC_OR_DEPARTMENT_OTHER): Payer: Medicare Other

## 2020-10-23 ENCOUNTER — Encounter (HOSPITAL_BASED_OUTPATIENT_CLINIC_OR_DEPARTMENT_OTHER): Payer: Self-pay | Admitting: Emergency Medicine

## 2020-10-23 DIAGNOSIS — J181 Lobar pneumonia, unspecified organism: Secondary | ICD-10-CM | POA: Diagnosis not present

## 2020-10-23 DIAGNOSIS — Z20822 Contact with and (suspected) exposure to covid-19: Secondary | ICD-10-CM | POA: Diagnosis not present

## 2020-10-23 DIAGNOSIS — N183 Chronic kidney disease, stage 3 unspecified: Secondary | ICD-10-CM | POA: Diagnosis not present

## 2020-10-23 DIAGNOSIS — Z79899 Other long term (current) drug therapy: Secondary | ICD-10-CM | POA: Diagnosis not present

## 2020-10-23 DIAGNOSIS — I129 Hypertensive chronic kidney disease with stage 1 through stage 4 chronic kidney disease, or unspecified chronic kidney disease: Secondary | ICD-10-CM | POA: Insufficient documentation

## 2020-10-23 DIAGNOSIS — Z8542 Personal history of malignant neoplasm of other parts of uterus: Secondary | ICD-10-CM | POA: Diagnosis not present

## 2020-10-23 DIAGNOSIS — Z87891 Personal history of nicotine dependence: Secondary | ICD-10-CM | POA: Diagnosis not present

## 2020-10-23 DIAGNOSIS — R059 Cough, unspecified: Secondary | ICD-10-CM | POA: Diagnosis not present

## 2020-10-23 DIAGNOSIS — Z7982 Long term (current) use of aspirin: Secondary | ICD-10-CM | POA: Diagnosis not present

## 2020-10-23 DIAGNOSIS — J189 Pneumonia, unspecified organism: Secondary | ICD-10-CM | POA: Diagnosis not present

## 2020-10-23 LAB — RESP PANEL BY RT-PCR (FLU A&B, COVID) ARPGX2
Influenza A by PCR: NEGATIVE
Influenza B by PCR: NEGATIVE
SARS Coronavirus 2 by RT PCR: NEGATIVE

## 2020-10-23 MED ORDER — DOXYCYCLINE HYCLATE 100 MG PO CAPS: 100.0000 mg | ORAL_CAPSULE | Freq: Two times a day (BID) | ORAL | 0 refills | Status: DC

## 2020-10-23 MED ORDER — ALBUTEROL SULFATE HFA 108 (90 BASE) MCG/ACT IN AERS
2.0000 | INHALATION_SPRAY | RESPIRATORY_TRACT | Status: DC | PRN
  Administered 2020-10-23: 2 via RESPIRATORY_TRACT
  Filled 2020-10-23: qty 6.7

## 2020-10-23 MED ORDER — AMOXICILLIN-POT CLAVULANATE 875-125 MG PO TABS: 1.0000 | ORAL_TABLET | Freq: Two times a day (BID) | ORAL | 0 refills | Status: DC

## 2020-10-23 NOTE — Discharge Instructions (Addendum)
1.  At this time, your vital signs are normal.  You are not having any difficulty breathing or chest pain.  Your chest x-ray does show a pneumonia.  Often, people can develop a bacterial pneumonia after they have been sick with a virus for several days or a week.  At this time, you are getting antibiotics for suspicion of a bacterial pneumonia based on your chest x-ray and symptoms. 2.  Use the inhaler given to you in the emergency department.  Put 2 puffs of the inhaler into the chamber and slowly inhale all of it.  Do this every 4 hours for the next 1 to 2 days.  When you do not feel like you are wheezing and are having less cough, you may decrease this to as needed use. 3.  Schedule a close follow-up appointment with your doctor for recheck within the next 3 to 5 days.  Return to the emergency department if you develop fevers, chest pain, shortness of breath, general weakness, confusion or other concerning symptoms.

## 2020-10-23 NOTE — ED Provider Notes (Signed)
[facility] EMERGENCY DEPARTMENT Provider Note   CSN: [id number] Arrival date & time: 10/23/20  5093     History Chief Complaint  Patient presents with   Cough    [name] is a 80 y.o. female.  HPI Reports her symptoms started with a scratchy throat and a mild cough.  This started over 2 weeks ago.  Patient reports her daughter first came down with symptoms about a week or 2 before she got them.  They are both being seen today.  The patient reports that from those symptoms she then developed cough.  She reports she started coughing with a dry cough but is gotten progressively more harsh and now she is bringing up mucus with cough.  No blood-tinged.  Mucus is sometimes yellow.  Patient denies that she is having any chest pain or feels short of breath.  She denies vomiting diarrhea.  She denies general body aches or fever.  She does not feel generally weak.  Both she and her daughter took home COVID test that were negative.    Past Medical History:  Diagnosis Date   Allergy    Anxiety    takes ativan as needed   Benign positional vertigo    Bursitis of shoulder, left    Cancer ([facility])    Endometrial ca/Recurrence   CKD (chronic kidney disease), stage III ([facility])    History of colonic polyps    Hypercholesteremia    Hypercholesterolemia    Hypertension    Lumbar pain    Osteoarthritis    Osteoporosis    S/P radiation therapy July 29, 2010-Sep 06, 2010   External beam of pelvis   S/P radiation therapy 09/15/10, 09/24/10, 09/28/2010   Intracavitary brachytherapy   SBO (small bowel obstruction) ([place])    Status post chemotherapy    carboplatin/paclitaxel x 6 rounds    Patient Active Problem List   Diagnosis Date Noted   Acute pain of right shoulder 09/30/2019   Myofascial pain 09/18/2019   Cervical radiculopathy 09/12/2019   Essential hypertension, benign 05/18/2013   SBO (small bowel obstruction) ([place]) 05/17/2013   Small bowel obstruction ([place]) 05/16/2013    Malignant neoplasm of corpus uteri, except isthmus ([place]) 05/16/2011    Past Surgical History:  Procedure Laterality Date   ABDOMINAL HYSTERECTOMY     BREAST LUMPECTOMY  1993   Left breast, benign   ROBOTIC ASSISTED LAPAROSCOPIC VAGINAL HYSTERECTOMY WITH FIBROID REMOVAL  July 2010   bilat. salpingo-oophorectomy     OB History   No obstetric history on file.     Family History  Problem Relation Age of Onset   Breast cancer Mother    Heart disease Father    Breast cancer Sister    Diabetes Sister    Colon cancer Neg Hx    Esophageal cancer Neg Hx    Rectal cancer Neg Hx    Stomach cancer Neg Hx     Social History   Tobacco Use   Smoking status: Former    Packs/day: 1.00    Pack years: 0.00    Types: Cigarettes    Quit date: 05/12/1976    Years since quitting: 44.4   Smokeless tobacco: Never  Vaping Use   Vaping Use: Never used  Substance Use Topics   Alcohol use: No   Drug use: No    Home Medications Prior to Admission medications   Medication Sig Start Date End Date Taking? Authorizing Provider  amoxicillin-clavulanate (AUGMENTIN) 875-125 MG tablet Take 1 tablet  by mouth 2 (two) times daily. One po bid x 7 days 10/23/20  Yes Zlatan Hornback, Jeannie Done, MD  aspirin 325 MG tablet Take 325 mg by mouth daily.    [provider]  atorvastatin (LIPITOR) 40 MG tablet TK 1 T PO QHS 11/05/15   [provider]  benazepril (LOTENSIN) 40 MG tablet Take 40 mg by mouth daily. 01/06/18   [provider]  calcium carbonate (OS-CAL) 600 MG TABS tablet Take 600 mg by mouth daily with breakfast.    [provider]  cetirizine (ZYRTEC) 10 MG tablet Take 10 mg by mouth daily as needed.  Patient not taking: Reported on 03/23/2020    [provider]  cholecalciferol (VITAMIN D) 1000 UNITS tablet Take 5,000 Units by mouth daily.     [provider]  cyclobenzaprine (FLEXERIL) 5 MG tablet Take 5 mg by mouth.    [provider]  denosumab  (PROLIA) 60 MG/ML SOLN injection Inject 60 mg into the skin every 6 (six) months. Administer in upper arm, thigh, or abdomen    [provider]  diclofenac sodium (VOLTAREN) 1 % GEL Apply 2 g topically daily as needed (for pain).    [provider]  doxycycline (VIBRAMYCIN) 100 MG capsule Take 1 capsule (100 mg total) by mouth 2 (two) times daily. One po bid x 7 days 10/23/20   Charlesetta Shanks, MD  ferrous sulfate 325 (65 FE) MG tablet Take 325 mg by mouth daily.     [provider]  fexofenadine (ALLEGRA) 180 MG tablet Take 180 mg by mouth daily as needed.     [provider]  Folic Acid-Vit V7-QIO N62 (FOLBEE) 2.5-25-1 MG TABS Take 1 tablet by mouth daily.    [provider]  gabapentin (NEURONTIN) 100 MG capsule Take 1 capsule (100 mg total) by mouth 3 (three) times daily as needed. 09/30/19   Rosemarie Ax, MD  ibuprofen (ADVIL,MOTRIN) 200 MG tablet Take 200 mg by mouth every 6 (six) hours as needed.    [provider]  loratadine (CLARITIN) 10 MG tablet Take 10 mg by mouth daily. Reported on 11/12/2015    [provider]  LORazepam (ATIVAN) 0.5 MG tablet Take 0.25 mg by mouth as needed for anxiety.    [provider]  meclizine (ANTIVERT) 25 MG tablet Take 25 mg by mouth 4 (four) times daily as needed for dizziness or nausea.    [provider]  Multiple Vitamin (MULTIVITAMIN) tablet Take 1 tablet by mouth daily. Centrum silver    [provider]  Omega-3 Fatty Acids (FISH OIL) 1200 MG CAPS Take 1,200 mg by mouth daily.    [provider]  Polyethyl Glycol-Propyl Glycol (SYSTANE) 0.4-0.3 % SOLN Place 1 drop into both eyes daily as needed (for dry eyes).    [provider]  polyethylene glycol (MIRALAX / GLYCOLAX) packet Take 17 g by mouth daily.    [provider]  promethazine (PHENERGAN) 25 MG tablet Take 25 mg by mouth every 4 (four) hours as needed for nausea or vomiting.     [provider]    Allergies    Phenobarbital  Review of Systems   Review of Systems 10 Systems reviewed and negative except as per HPI Physical Exam Updated Vital Signs BP (!) 151/67   Pulse 85   Temp 98.7 F (37.1 C) (Oral)   Resp 16   Ht 5\' 5"  (1.651 m)   Wt 69.4 kg   SpO2 95%  BMI 25.46 kg/m   Physical Exam Constitutional:      Comments: Patient is alert nontoxic.  No respiratory distress.  Mental status very good.  Well-nourished well-developed for age.  HENT:     Head: Normocephalic and atraumatic.     Mouth/Throat:     Mouth: Mucous membranes are moist.     Pharynx: Oropharynx is clear.  Eyes:     Extraocular Movements: Extraocular movements intact.  Cardiovascular:     Rate and Rhythm: Normal rate and regular rhythm.  Pulmonary:     Comments: No respiratory distress.  Patient does have some expiratory wheeze scattered in both lung fields.  Adequate airflow to the bases but some focal crackles on the right. Abdominal:     General: There is no distension.     Palpations: Abdomen is soft.     Tenderness: There is no abdominal tenderness. There is no guarding.  Musculoskeletal:     Comments: Lower extremities are in very good condition.  No peripheral edema.  Calves are soft and nontender.  Skin:    General: Skin is warm and dry.  Neurological:     General: No focal deficit present.     Mental Status: She is oriented to person, place, and time.     Coordination: Coordination normal.  Psychiatric:        Mood and Affect: Mood normal.    ED Results / Procedures / Treatments   Labs (all labs ordered are listed, but only abnormal results are displayed) Labs Reviewed  RESP PANEL BY RT-PCR (FLU A&B, COVID) [id number]    EKG None  Radiology DG Chest Port 1 View  Result Date: 10/23/2020 CLINICAL DATA:  80 year old female with cough for 2 weeks. EXAM: PORTABLE CHEST 1 VIEW COMPARISON:  Chest radiographs 04/24/2019 and earlier. FINDINGS: Portable AP  semi upright view at 1045 hours. There is consolidation in the right upper lobe abutting the minor fissure. Similar lung volumes to 2020. Mediastinal contours are stable and within normal limits. Elsewhere lung markings remain normal. Visualized tracheal air column is within normal limits. No pneumothorax. No pleural effusion. No acute osseous abnormality identified. IMPRESSION: Right upper lobe consolidation abutting the minor fissure compatible with Pneumonia. Followup PA and lateral chest X-ray is recommended in 3-4 weeks following trial of antibiotic therapy to ensure resolution and exclude underlying malignancy. Electronically Signed   By: Genevie Ann M.D.   On: 10/23/2020 11:22    Procedures Procedures   Medications Ordered in ED Medications  albuterol (VENTOLIN HFA) 108 (90 Base) MCG/ACT inhaler 2 puff (2 puffs Inhalation Given 10/23/20 0930)    ED Course  I have reviewed the triage vital signs and the nursing notes.  Pertinent labs & imaging results that were available during my care of the patient were reviewed by me and considered in my medical decision making (see chart for details).    MDM Rules/Calculators/A&P                          Patient has symptoms started several weeks ago consistent with a URI.  Her daughter also has similar symptoms is being seen today.  The patient however has progressed to having a productive and persistent cough.  Sometimes cough paroxysmal's.  She does not have hypoxia or any vital sign instability.  No subjective shortness of breath or pleuritic chest pain.  This x-ray does show a focal area of pneumonia.  At this time I suspect secondary bacterial  pneumonia following viral illness.  Both patient and her daughter tested negative for COVID.  At this time patient is stable to initiate outpatient therapy for community-acquired pneumonia.  We will start Augmentin and doxycycline.  Patient is given an inhaler from the emergency department to use for wheezing.   Careful return cautions reviewed with the patient. Final Clinical Impression(s) / ED Diagnoses Final diagnoses:  Community acquired pneumonia of right middle lobe of lung    Rx / DC Orders ED Discharge Orders          Ordered    amoxicillin-clavulanate (AUGMENTIN) 875-125 MG tablet  2 times daily        10/23/20 1136    doxycycline (VIBRAMYCIN) 100 MG capsule  2 times daily,   Status:  Discontinued        10/23/20 1136    doxycycline (VIBRAMYCIN) 100 MG capsule  2 times daily        10/23/20 1147             Charlesetta Shanks, MD 10/23/20 1149

## 2020-10-23 NOTE — ED Triage Notes (Signed)
Reports productive cough x 2 weeks , negative covid home test yesterday . Denies chest pain nor shortness of breath.

## 2020-12-23 DIAGNOSIS — E1129 Type 2 diabetes mellitus with other diabetic kidney complication: Secondary | ICD-10-CM | POA: Diagnosis not present

## 2020-12-23 DIAGNOSIS — N182 Chronic kidney disease, stage 2 (mild): Secondary | ICD-10-CM | POA: Diagnosis not present

## 2020-12-23 DIAGNOSIS — E1122 Type 2 diabetes mellitus with diabetic chronic kidney disease: Secondary | ICD-10-CM | POA: Diagnosis not present

## 2020-12-23 DIAGNOSIS — I129 Hypertensive chronic kidney disease with stage 1 through stage 4 chronic kidney disease, or unspecified chronic kidney disease: Secondary | ICD-10-CM | POA: Diagnosis not present

## 2020-12-23 DIAGNOSIS — E785 Hyperlipidemia, unspecified: Secondary | ICD-10-CM | POA: Diagnosis not present

## 2020-12-23 DIAGNOSIS — M1990 Unspecified osteoarthritis, unspecified site: Secondary | ICD-10-CM | POA: Diagnosis not present

## 2020-12-23 DIAGNOSIS — N1832 Chronic kidney disease, stage 3b: Secondary | ICD-10-CM | POA: Diagnosis not present

## 2020-12-29 NOTE — Assessment & Plan Note (Signed)
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   Negative symptom review/exam NED   >Continue yearly follow-up    

## 2020-12-29 NOTE — Progress Notes (Signed)
Follow Up Note: Gyn-Onc  [name] 80 y.o. female  CC: She presents for a f/u visit.  HPI:  Oncology History  Malignant neoplasm of corpus uteri, except isthmus ([place])  10/2008 Surgery    robotic-assisted laparoscopic hysterectomy, bilateral salpingo- oophorectomy, lymph node dissection   2010 -  Chemotherapy    She was enrolled in GOG protocol 258 and was randomized to receive 6 cycles of Taxol and carboplatin    2011 Relapse/Recurrence    Recurrent disease was identified at a laparoscopic port site, pelvic lymph nodes and in the vagina   05/16/2011 Initial Diagnosis   Malignant neoplasm of corpus uteri, except isthmus ([place])    - 2011 Radiation Therapy   adjuvant external beam and vaginal brachytherapy     - 09/2010 Chemotherapy    Taxol carboplatin therapy       Interval History:   She denies vaginal bleeding, abdominal/pelvic pain, cough, lethargy or abdominal distension.  She has recovered from a recent viral illness.  Review of Systems  Review of Systems  Constitutional:  Negative for malaise/fatigue and weight loss.  Respiratory:  Negative for cough.   Gastrointestinal:  Negative for abdominal pain.  Genitourinary:        No bleeding  Current Meds:  Outpatient Encounter Medications as of 12/30/2020  Medication Sig   amoxicillin-clavulanate (AUGMENTIN) 875-125 MG tablet Take 1 tablet by mouth 2 (two) times daily. One po bid x 7 days   aspirin 325 MG tablet Take 325 mg by mouth daily.   atorvastatin (LIPITOR) 40 MG tablet TK 1 T PO QHS   benazepril (LOTENSIN) 40 MG tablet Take 40 mg by mouth daily.   calcium carbonate (OS-CAL) 600 MG TABS tablet Take 600 mg by mouth daily with breakfast.   cetirizine (ZYRTEC) 10 MG tablet Take 10 mg by mouth daily as needed.  (Patient not taking: Reported on 03/23/2020)   cholecalciferol (VITAMIN D) 1000 UNITS tablet Take 5,000 Units by mouth daily.     cyclobenzaprine (FLEXERIL) 5 MG tablet Take 5 mg by mouth.   denosumab (PROLIA) 60 MG/ML SOLN injection Inject 60 mg into the skin every 6 (six) months. Administer in upper arm, thigh, or abdomen   diclofenac sodium (VOLTAREN) 1 % GEL Apply 2 g topically daily as needed (for pain).   doxycycline (VIBRAMYCIN) 100 MG capsule Take 1 capsule (100 mg total) by mouth 2 (two) times daily. One po bid x 7 days   ferrous sulfate 325 (65 FE) MG tablet Take 325 mg by mouth daily.    fexofenadine (ALLEGRA) 180 MG tablet Take 180 mg by mouth daily as needed.    Folic Acid-Vit Q000111Q [id number] (FOLBEE) 2.5-25-1 MG TABS Take 1 tablet by mouth daily.   gabapentin (NEURONTIN) 100 MG capsule Take 1 capsule (100 mg total) by mouth 3 (three) times daily as needed.   ibuprofen (ADVIL,MOTRIN) 200 MG tablet Take 200 mg by mouth every 6 (six) hours as needed.   loratadine (CLARITIN) 10 MG tablet Take 10 mg by mouth daily. Reported on 11/12/2015   LORazepam (ATIVAN) 0.5 MG tablet Take 0.25 mg by mouth as needed for anxiety.   meclizine (ANTIVERT) 25 MG tablet Take 25 mg by mouth 4 (four) times daily as needed for dizziness or nausea.   Multiple Vitamin (MULTIVITAMIN) tablet Take 1 tablet by mouth daily. Centrum silver   Omega-3 Fatty Acids (FISH OIL) 1200 MG CAPS Take 1,200 mg by mouth daily.   Polyethyl Glycol-Propyl Glycol (SYSTANE) 0.4-0.3 % SOLN Place  1 drop into both eyes daily as needed (for dry eyes).   polyethylene glycol (MIRALAX / GLYCOLAX) packet Take 17 g by mouth daily.   promethazine (PHENERGAN) 25 MG tablet Take 25 mg by mouth every 4 (four) hours as needed for nausea or vomiting.   No facility-administered encounter medications on file as of 12/30/2020.    Allergy:  Allergies  Allergen Reactions   Phenobarbital     Feeling of uneasiness    Social Hx:   Social History   Socioeconomic History   Marital status: Widowed    Spouse name: Not on file   Number of children: Not on file   Years of  education: Not on file   Highest education level: Not on file  Occupational History   Not on file  Tobacco Use   Smoking status: Former    Packs/day: 1.00    Types: Cigarettes    Quit date: 05/12/1976    Years since quitting: 44.6   Smokeless tobacco: Never  Vaping Use   Vaping Use: Never used  Substance and Sexual Activity   Alcohol use: No   Drug use: No   Sexual activity: Never  Other Topics Concern   Not on file  Social History Narrative   Not on file   Social Determinants of Health   Financial Resource Strain: Not on file  Food Insecurity: Not on file  Transportation Needs: Not on file  Physical Activity: Not on file  Stress: Not on file  Social Connections: Not on file  Intimate Partner Violence: Not on file    Past Surgical Hx:  Past Surgical History:  Procedure Laterality Date   ABDOMINAL HYSTERECTOMY     BREAST LUMPECTOMY  1993   Left breast, benign   ROBOTIC ASSISTED LAPAROSCOPIC VAGINAL HYSTERECTOMY WITH FIBROID REMOVAL  July 2010   bilat. salpingo-oophorectomy    Past Medical Hx:  Past Medical History:  Diagnosis Date   Allergy    Anxiety    takes ativan as needed   Benign positional vertigo    Bursitis of shoulder, left    Cancer (HCC)    Endometrial ca/Recurrence   CKD (chronic kidney disease), stage III (HCC)    History of colonic polyps    Hypercholesteremia    Hypercholesterolemia    Hypertension    Lumbar pain    Osteoarthritis    Osteoporosis    S/P radiation therapy July 29, 2010-Sep 06, 2010   External beam of pelvis   S/P radiation therapy 09/15/10, 09/24/10, 09/28/2010   Intracavitary brachytherapy   SBO (small bowel obstruction) ([place])    Status post chemotherapy    carboplatin/paclitaxel x 6 rounds    Family Hx:  Family History  Problem Relation Age of Onset   Breast cancer Mother    Heart disease Father    Breast cancer Sister    Diabetes Sister    Colon cancer Neg Hx    Esophageal cancer Neg Hx    Rectal cancer Neg Hx     Stomach cancer Neg Hx     Vitals:  BP (!) 170/81 (BP Location: Left Arm, Patient Position: Sitting)   Pulse 84   Temp 97.7 F (36.5 C) (Oral)   Resp 18   Ht '5\' 5"'$  (1.651 m)   Wt 150 lb 6.4 oz (68.2 kg)   SpO2 100%   BMI 25.03 kg/m   Physical Exam Abdominal:     General: There is no distension.     Palpations: Abdomen is soft.  Tenderness: There is no abdominal tenderness.  Genitourinary:    Adnexa:        Right: No mass.         Left: No mass.       Rectum: Normal.     Comments: Vulva: white, waxy skin; petechiae, purpuric areas  Vagina shortened w/synechiae at the apex Lymphadenopathy:     Upper Body:     Right upper body: No supraclavicular adenopathy.     Left upper body: No supraclavicular adenopathy.     Lower Body: No right inguinal adenopathy. No left inguinal adenopathy.  Skin:    General: Skin is warm and dry.  Neurological:     Mental Status: She is alert and oriented to person, place, and time.   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   Negative symptom review/exam NED  >Continue yearly follow-up   I personally spent 25 minutes face-to-face and non-face-to-face in the care of this patient, which includes all pre, intra, and post visit time on the date of service.   Lahoma Crocker, MD 12/29/2020, 9:38 AM

## 2020-12-30 ENCOUNTER — Inpatient Hospital Stay: Payer: Medicare Other | Attending: Obstetrics & Gynecology | Admitting: Obstetrics & Gynecology

## 2020-12-30 ENCOUNTER — Encounter: Payer: Self-pay | Admitting: Obstetrics & Gynecology

## 2020-12-30 ENCOUNTER — Other Ambulatory Visit: Payer: Self-pay

## 2020-12-30 DIAGNOSIS — Z923 Personal history of irradiation: Secondary | ICD-10-CM | POA: Insufficient documentation

## 2020-12-30 DIAGNOSIS — Z9221 Personal history of antineoplastic chemotherapy: Secondary | ICD-10-CM | POA: Insufficient documentation

## 2020-12-30 DIAGNOSIS — Z8542 Personal history of malignant neoplasm of other parts of uterus: Secondary | ICD-10-CM | POA: Insufficient documentation

## 2020-12-30 DIAGNOSIS — Z90710 Acquired absence of both cervix and uterus: Secondary | ICD-10-CM | POA: Insufficient documentation

## 2020-12-30 DIAGNOSIS — C549 Malignant neoplasm of corpus uteri, unspecified: Secondary | ICD-10-CM

## 2020-12-30 DIAGNOSIS — Z90722 Acquired absence of ovaries, bilateral: Secondary | ICD-10-CM | POA: Insufficient documentation

## 2020-12-30 NOTE — Patient Instructions (Signed)
Return in 1 year ?

## 2021-01-19 DIAGNOSIS — Z23 Encounter for immunization: Secondary | ICD-10-CM | POA: Diagnosis not present

## 2021-01-25 DIAGNOSIS — Z23 Encounter for immunization: Secondary | ICD-10-CM | POA: Diagnosis not present

## 2021-02-18 DIAGNOSIS — Z1231 Encounter for screening mammogram for malignant neoplasm of breast: Secondary | ICD-10-CM | POA: Diagnosis not present

## 2021-03-15 ENCOUNTER — Other Ambulatory Visit (HOSPITAL_COMMUNITY): Payer: Self-pay | Admitting: *Deleted

## 2021-03-16 ENCOUNTER — Ambulatory Visit (HOSPITAL_COMMUNITY)
Admission: RE | Admit: 2021-03-16 | Discharge: 2021-03-16 | Disposition: A | Discharge status: Home / Self Care | Payer: Medicare Other | Source: Ambulatory Visit | Attending: Internal Medicine | Admitting: Internal Medicine

## 2021-03-16 ENCOUNTER — Other Ambulatory Visit: Payer: Self-pay

## 2021-03-16 DIAGNOSIS — M810 Age-related osteoporosis without current pathological fracture: Secondary | ICD-10-CM | POA: Diagnosis not present

## 2021-03-16 MED ORDER — DENOSUMAB 60 MG/ML ~~LOC~~ SOSY
PREFILLED_SYRINGE | SUBCUTANEOUS | Status: AC
  Filled 2021-03-16: qty 1

## 2021-03-16 MED ORDER — DENOSUMAB 60 MG/ML ~~LOC~~ SOSY
60.0000 mg | PREFILLED_SYRINGE | Freq: Once | SUBCUTANEOUS | Status: AC
  Administered 2021-03-16: 60 mg via SUBCUTANEOUS

## 2021-03-24 DIAGNOSIS — E7800 Pure hypercholesterolemia, unspecified: Secondary | ICD-10-CM | POA: Diagnosis not present

## 2021-03-24 DIAGNOSIS — I129 Hypertensive chronic kidney disease with stage 1 through stage 4 chronic kidney disease, or unspecified chronic kidney disease: Secondary | ICD-10-CM | POA: Diagnosis not present

## 2021-03-24 DIAGNOSIS — N1831 Chronic kidney disease, stage 3a: Secondary | ICD-10-CM | POA: Diagnosis not present

## 2021-03-24 DIAGNOSIS — E1129 Type 2 diabetes mellitus with other diabetic kidney complication: Secondary | ICD-10-CM | POA: Diagnosis not present

## 2021-04-06 DIAGNOSIS — M810 Age-related osteoporosis without current pathological fracture: Secondary | ICD-10-CM | POA: Diagnosis not present

## 2021-04-06 DIAGNOSIS — E7800 Pure hypercholesterolemia, unspecified: Secondary | ICD-10-CM | POA: Diagnosis not present

## 2021-04-06 DIAGNOSIS — E1129 Type 2 diabetes mellitus with other diabetic kidney complication: Secondary | ICD-10-CM | POA: Diagnosis not present

## 2021-04-13 DIAGNOSIS — F418 Other specified anxiety disorders: Secondary | ICD-10-CM | POA: Diagnosis not present

## 2021-04-13 DIAGNOSIS — Z1212 Encounter for screening for malignant neoplasm of rectum: Secondary | ICD-10-CM | POA: Diagnosis not present

## 2021-04-13 DIAGNOSIS — R809 Proteinuria, unspecified: Secondary | ICD-10-CM | POA: Diagnosis not present

## 2021-04-13 DIAGNOSIS — M810 Age-related osteoporosis without current pathological fracture: Secondary | ICD-10-CM | POA: Diagnosis not present

## 2021-04-13 DIAGNOSIS — N1831 Chronic kidney disease, stage 3a: Secondary | ICD-10-CM | POA: Diagnosis not present

## 2021-04-13 DIAGNOSIS — R82998 Other abnormal findings in urine: Secondary | ICD-10-CM | POA: Diagnosis not present

## 2021-04-13 DIAGNOSIS — E7800 Pure hypercholesterolemia, unspecified: Secondary | ICD-10-CM | POA: Diagnosis not present

## 2021-04-13 DIAGNOSIS — D509 Iron deficiency anemia, unspecified: Secondary | ICD-10-CM | POA: Diagnosis not present

## 2021-04-13 DIAGNOSIS — E1129 Type 2 diabetes mellitus with other diabetic kidney complication: Secondary | ICD-10-CM | POA: Diagnosis not present

## 2021-04-13 DIAGNOSIS — Z0000 Encounter for general adult medical examination without abnormal findings: Secondary | ICD-10-CM | POA: Diagnosis not present

## 2021-04-13 DIAGNOSIS — Z1339 Encounter for screening examination for other mental health and behavioral disorders: Secondary | ICD-10-CM | POA: Diagnosis not present

## 2021-04-13 DIAGNOSIS — I129 Hypertensive chronic kidney disease with stage 1 through stage 4 chronic kidney disease, or unspecified chronic kidney disease: Secondary | ICD-10-CM | POA: Diagnosis not present

## 2021-04-13 DIAGNOSIS — E46 Unspecified protein-calorie malnutrition: Secondary | ICD-10-CM | POA: Diagnosis not present

## 2021-04-13 DIAGNOSIS — Z1331 Encounter for screening for depression: Secondary | ICD-10-CM | POA: Diagnosis not present

## 2021-08-19 DIAGNOSIS — Z20822 Contact with and (suspected) exposure to covid-19: Secondary | ICD-10-CM | POA: Diagnosis not present

## 2021-08-25 DIAGNOSIS — Z20822 Contact with and (suspected) exposure to covid-19: Secondary | ICD-10-CM | POA: Diagnosis not present

## 2021-08-30 ENCOUNTER — Other Ambulatory Visit: Payer: Self-pay

## 2021-08-30 DIAGNOSIS — M810 Age-related osteoporosis without current pathological fracture: Secondary | ICD-10-CM | POA: Insufficient documentation

## 2021-09-15 ENCOUNTER — Ambulatory Visit (HOSPITAL_COMMUNITY)
Admission: RE | Admit: 2021-09-15 | Discharge: 2021-09-15 | Disposition: A | Discharge status: Home / Self Care | Payer: Medicare Other | Source: Ambulatory Visit | Attending: Internal Medicine | Admitting: Internal Medicine

## 2021-09-15 DIAGNOSIS — M810 Age-related osteoporosis without current pathological fracture: Secondary | ICD-10-CM | POA: Diagnosis not present

## 2021-09-15 MED ORDER — DENOSUMAB 60 MG/ML ~~LOC~~ SOSY
60.0000 mg | PREFILLED_SYRINGE | Freq: Once | SUBCUTANEOUS | Status: AC
  Administered 2021-09-15: 60 mg via SUBCUTANEOUS

## 2021-09-15 MED ORDER — DENOSUMAB 60 MG/ML ~~LOC~~ SOSY
PREFILLED_SYRINGE | SUBCUTANEOUS | Status: AC
  Filled 2021-09-15: qty 1

## 2021-09-21 DIAGNOSIS — L821 Other seborrheic keratosis: Secondary | ICD-10-CM | POA: Diagnosis not present

## 2021-09-21 DIAGNOSIS — L820 Inflamed seborrheic keratosis: Secondary | ICD-10-CM | POA: Diagnosis not present

## 2021-09-21 DIAGNOSIS — D2371 Other benign neoplasm of skin of right lower limb, including hip: Secondary | ICD-10-CM | POA: Diagnosis not present

## 2021-09-21 DIAGNOSIS — L578 Other skin changes due to chronic exposure to nonionizing radiation: Secondary | ICD-10-CM | POA: Diagnosis not present

## 2021-09-21 DIAGNOSIS — D1801 Hemangioma of skin and subcutaneous tissue: Secondary | ICD-10-CM | POA: Diagnosis not present

## 2021-09-21 DIAGNOSIS — L2089 Other atopic dermatitis: Secondary | ICD-10-CM | POA: Diagnosis not present

## 2021-10-05 DIAGNOSIS — I129 Hypertensive chronic kidney disease with stage 1 through stage 4 chronic kidney disease, or unspecified chronic kidney disease: Secondary | ICD-10-CM | POA: Diagnosis not present

## 2021-10-05 DIAGNOSIS — E7800 Pure hypercholesterolemia, unspecified: Secondary | ICD-10-CM | POA: Diagnosis not present

## 2021-10-05 DIAGNOSIS — F418 Other specified anxiety disorders: Secondary | ICD-10-CM | POA: Diagnosis not present

## 2021-10-05 DIAGNOSIS — H8110 Benign paroxysmal vertigo, unspecified ear: Secondary | ICD-10-CM | POA: Diagnosis not present

## 2021-10-05 DIAGNOSIS — E1129 Type 2 diabetes mellitus with other diabetic kidney complication: Secondary | ICD-10-CM | POA: Diagnosis not present

## 2021-10-05 DIAGNOSIS — R809 Proteinuria, unspecified: Secondary | ICD-10-CM | POA: Diagnosis not present

## 2021-10-05 DIAGNOSIS — D509 Iron deficiency anemia, unspecified: Secondary | ICD-10-CM | POA: Diagnosis not present

## 2021-10-05 DIAGNOSIS — Z1331 Encounter for screening for depression: Secondary | ICD-10-CM | POA: Diagnosis not present

## 2021-10-05 DIAGNOSIS — N1831 Chronic kidney disease, stage 3a: Secondary | ICD-10-CM | POA: Diagnosis not present

## 2021-10-05 DIAGNOSIS — M810 Age-related osteoporosis without current pathological fracture: Secondary | ICD-10-CM | POA: Diagnosis not present

## 2021-10-05 DIAGNOSIS — C541 Malignant neoplasm of endometrium: Secondary | ICD-10-CM | POA: Diagnosis not present

## 2021-10-05 DIAGNOSIS — Z1339 Encounter for screening examination for other mental health and behavioral disorders: Secondary | ICD-10-CM | POA: Diagnosis not present

## 2021-10-07 ENCOUNTER — Telehealth: Payer: Self-pay | Admitting: Pharmacy Technician

## 2021-10-07 NOTE — Telephone Encounter (Signed)
Auth Submission: no auth needed Payer: MEDICARE A/B / [employer] [place] Medication & CPT/J Code(s) submitted: Prolia (Denosumab) [phone] Route of submission (phone, fax, portal): PHONE Auth type: Buy/Bill Units/visits requested: X1 DOSE Reference number: [id number] Approval from: 10/08/21 to 04/09/22

## 2021-10-15 ENCOUNTER — Encounter: Payer: Self-pay | Admitting: Internal Medicine

## 2021-11-17 ENCOUNTER — Encounter: Payer: Self-pay | Admitting: Internal Medicine

## 2021-11-17 ENCOUNTER — Ambulatory Visit (INDEPENDENT_AMBULATORY_CARE_PROVIDER_SITE_OTHER): Payer: Medicare Other | Admitting: Internal Medicine

## 2021-11-17 VITALS — BP 136/72 | HR 75 | Ht 65.0 in | Wt 153.0 lb

## 2021-11-17 DIAGNOSIS — R1310 Dysphagia, unspecified: Secondary | ICD-10-CM | POA: Diagnosis not present

## 2021-11-17 DIAGNOSIS — D509 Iron deficiency anemia, unspecified: Secondary | ICD-10-CM | POA: Diagnosis not present

## 2021-11-17 DIAGNOSIS — K219 Gastro-esophageal reflux disease without esophagitis: Secondary | ICD-10-CM | POA: Diagnosis not present

## 2021-11-17 DIAGNOSIS — R1319 Other dysphagia: Secondary | ICD-10-CM | POA: Diagnosis not present

## 2021-11-17 DIAGNOSIS — D500 Iron deficiency anemia secondary to blood loss (chronic): Secondary | ICD-10-CM | POA: Diagnosis not present

## 2021-11-17 MED ORDER — OMEPRAZOLE 40 MG PO CPDR: 40.0000 mg | DELAYED_RELEASE_CAPSULE | Freq: Every day | ORAL | 3 refills | Status: DC

## 2021-11-17 NOTE — Progress Notes (Signed)
HISTORY OF PRESENT ILLNESS:  [name] is a 81 y.o. 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  She is accompanied by her daughter [name].  I last saw the patient in the office May 01, 2018 regarding iron deficiency anemia and Hemoccult positive stool.  She does have a history of endometrial cancer and is status post surgery followed by chemoradiation therapy.  See that dictation for details.  She subsequently underwent colonoscopy and upper endoscopy May 09, 2018.  Colonoscopy, including intubation of the terminal ileum was normal except for diminutive colon polyps which were benign and removed.  Upper endoscopy was unremarkable.  She did have a small hiatal hernia.  Duodenal biopsies were negative.  Capsule endoscopy was not performed due to her prior history of surgery and radiation.  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  She was told to take iron indefinitely and follow-up with her PCP.  Recent blood work from October 05, 2021 showed hemoglobin of 9.5 with MCV 77 6.  Patient and her daughter tell me that she has been unable to tolerate iron previously, thus she did not take iron.  She was recently placed back on iron therapy as well as Prilosec OTC for GERD symptoms.  She does take Advil and aspirin.  She denies abdominal pain, melena, or hematochezia.  She has been feeling tired.  GI review of systems is otherwise negative  REVIEW OF SYSTEMS:  All non-GI ROS negative unless otherwise stated in the HPI except for arthritis, sinus and allergy, excessive urination, occasional ankle edema  Past Medical History:  Diagnosis Date   Allergy    Anxiety    takes ativan as needed   Arthritis    Benign positional vertigo    Bursitis of shoulder, left    Cancer (HCC)    Endometrial ca/Recurrence   CKD (chronic kidney  disease), stage III (HCC)    Depression    Diabetes ([place])    History of colonic polyps    Hypercholesteremia    Hypercholesterolemia    Hypertension    Lumbar pain    Osteoarthritis    Osteoporosis    S/P radiation therapy July 29, 2010-Sep 06, 2010   External beam of pelvis   S/P radiation therapy 09/15/10, 09/24/10, 09/28/2010   Intracavitary brachytherapy   SBO (small bowel obstruction) ([place])    Status post chemotherapy    carboplatin/paclitaxel x 6 rounds    Past Surgical History:  Procedure Laterality Date   ABDOMINAL HYSTERECTOMY     BREAST LUMPECTOMY  1993   Left breast, benign   ROBOTIC ASSISTED LAPAROSCOPIC VAGINAL HYSTERECTOMY WITH FIBROID REMOVAL  July 2010   bilat. salpingo-oophorectomy    Social History Chaslyn Eisen  reports that she quit smoking about 45 years ago. Her smoking use included cigarettes. She smoked an average of 1 pack per day. She has never used smokeless tobacco. She reports that she does not drink alcohol and does not use drugs.  family history includes Breast cancer in her mother and sister; Diabetes in her sister; Heart disease in her father.  Allergies  Allergen Reactions   Phenobarbital     Feeling of uneasiness       PHYSICAL EXAMINATION: Vital signs: BP 136/72   Pulse 75   Ht '5\' 5"'$  (1.651 m)   Wt 153 lb (69.4 kg)  SpO2 97%   BMI 25.46 kg/m   Constitutional: generally well-appearing, no acute distress Psychiatric: alert and oriented x3, cooperative Eyes: extraocular movements intact, anicteric, conjunctiva pink Mouth: oral pharynx moist, no lesions Neck: supple no lymphadenopathy Cardiovascular: heart regular rate and rhythm, no murmur Lungs: clear to auscultation bilaterally Abdomen: soft, nontender, nondistended, no obvious ascites, no peritoneal signs, normal bowel sounds, no organomegaly Rectal: Omitted Extremities: no clubbing or cyanosis.  Trace lower extremity edema bilaterally Skin: no lesions on visible  extremities Neuro: No focal deficits.  Cranial nerves intact  ASSESSMENT:  1.  Iron deficiency anemia.  Previously worked up as described.  Has not had adequate iron replacement. 2.  Intolerant to oral iron 3.  GERD with mild dysphagia.  Now on PPI 4.  Chronic aspirin/NSAIDs. 5.  Colonoscopy 2020 with diminutive polyp 6.  EGD 2020 was unremarkable 7.  General medical problems.  Stable   PLAN:  1.  Continue PPI for GERD symptoms. 2.  Prescribed omeprazole 40 mg daily.  Medication risks reviewed 3.  Refer to hematology for IV iron replacement therapy and chronic monitoring of blood counts and iron levels 4.  Schedule upper endoscopy with possible esophageal dilation to evaluate GERD, dysphagia, and iron deficiency anemia the patient on NSAIDs.  Rule out interval change from 3+ years ago.  Patient is higher than baseline risk due to her age.  We willThe nature of the procedure, as well as the risks, benefits, and alternatives were carefully and thoroughly reviewed with the patient. Ample time for discussion and questions allowed. The patient understood, was satisfied, and agreed to proceed.   A total time of 45 minutes was spent preparing to see the patient, reviewing outside records, reviewing outside laboratories, obtaining comprehensive history, performing medically appropriate physical examination, counseling and educating the patient and her daughter regarding the above listed issues, prescribing medication, referring to hematology, and ordering advanced endoscopic procedure.  Finally, documenting clinical information in the health record

## 2021-11-17 NOTE — Patient Instructions (Signed)
If you are age 82 or older, your body mass index should be between 23-30. Your Body mass index is 25.46 kg/m. If this is out of the aforementioned range listed, please consider follow up with your Primary Care Provider.  If you are age 68 or younger, your body mass index should be between 19-25. Your Body mass index is 25.46 kg/m. If this is out of the aformentioned range listed, please consider follow up with your Primary Care Provider.   ________________________________________________________  The [facility] providers would like to encourage you to use [facility] to communicate with providers for non-urgent requests or questions.  Due to long hold times on the telephone, sending your provider a message by [facility] may be a faster and more efficient way to get a response.  Please allow 48 business hours for a response.  Please remember that this is for non-urgent requests.  _______________________________________________________  We have sent the following medications to your pharmacy for you to pick up at your convenience:  Omeprazole  You have been scheduled for an endoscopy. Please follow written instructions given to you at your visit today. If you use inhalers (even only as needed), please bring them with you on the day of your procedure.  You have been referred to hematology - they will contact you to set up an appointment

## 2021-11-22 ENCOUNTER — Inpatient Hospital Stay (HOSPITAL_BASED_OUTPATIENT_CLINIC_OR_DEPARTMENT_OTHER): Payer: Medicare Other | Admitting: Family

## 2021-11-22 ENCOUNTER — Telehealth: Payer: Self-pay | Admitting: *Deleted

## 2021-11-22 ENCOUNTER — Other Ambulatory Visit: Payer: Self-pay | Admitting: Family

## 2021-11-22 ENCOUNTER — Inpatient Hospital Stay: Payer: Medicare Other | Attending: Hematology & Oncology

## 2021-11-22 ENCOUNTER — Encounter: Payer: Self-pay | Admitting: Family

## 2021-11-22 DIAGNOSIS — Z90721 Acquired absence of ovaries, unilateral: Secondary | ICD-10-CM | POA: Diagnosis not present

## 2021-11-22 DIAGNOSIS — Z833 Family history of diabetes mellitus: Secondary | ICD-10-CM | POA: Insufficient documentation

## 2021-11-22 DIAGNOSIS — Z7982 Long term (current) use of aspirin: Secondary | ICD-10-CM | POA: Insufficient documentation

## 2021-11-22 DIAGNOSIS — Z87891 Personal history of nicotine dependence: Secondary | ICD-10-CM | POA: Insufficient documentation

## 2021-11-22 DIAGNOSIS — R5383 Other fatigue: Secondary | ICD-10-CM | POA: Insufficient documentation

## 2021-11-22 DIAGNOSIS — Z803 Family history of malignant neoplasm of breast: Secondary | ICD-10-CM | POA: Insufficient documentation

## 2021-11-22 DIAGNOSIS — D649 Anemia, unspecified: Secondary | ICD-10-CM

## 2021-11-22 DIAGNOSIS — D509 Iron deficiency anemia, unspecified: Secondary | ICD-10-CM | POA: Diagnosis not present

## 2021-11-22 DIAGNOSIS — D500 Iron deficiency anemia secondary to blood loss (chronic): Secondary | ICD-10-CM | POA: Diagnosis not present

## 2021-11-22 DIAGNOSIS — Z8249 Family history of ischemic heart disease and other diseases of the circulatory system: Secondary | ICD-10-CM | POA: Diagnosis not present

## 2021-11-22 DIAGNOSIS — R531 Weakness: Secondary | ICD-10-CM | POA: Insufficient documentation

## 2021-11-22 DIAGNOSIS — R6883 Chills (without fever): Secondary | ICD-10-CM | POA: Diagnosis not present

## 2021-11-22 DIAGNOSIS — R202 Paresthesia of skin: Secondary | ICD-10-CM | POA: Insufficient documentation

## 2021-11-22 DIAGNOSIS — Z8719 Personal history of other diseases of the digestive system: Secondary | ICD-10-CM | POA: Insufficient documentation

## 2021-11-22 DIAGNOSIS — Z79899 Other long term (current) drug therapy: Secondary | ICD-10-CM | POA: Insufficient documentation

## 2021-11-22 DIAGNOSIS — N1832 Chronic kidney disease, stage 3b: Secondary | ICD-10-CM | POA: Insufficient documentation

## 2021-11-22 DIAGNOSIS — R200 Anesthesia of skin: Secondary | ICD-10-CM | POA: Diagnosis not present

## 2021-11-22 DIAGNOSIS — I129 Hypertensive chronic kidney disease with stage 1 through stage 4 chronic kidney disease, or unspecified chronic kidney disease: Secondary | ICD-10-CM | POA: Insufficient documentation

## 2021-11-22 LAB — CMP (CANCER CENTER ONLY)
ALT: 20 U/L (ref 0–44)
AST: 20 U/L (ref 15–41)
Albumin: 4.1 g/dL (ref 3.5–5.0)
Alkaline Phosphatase: 49 U/L (ref 38–126)
Anion gap: 8 (ref 5–15)
BUN: 14 mg/dL (ref 8–23)
CO2: 26 mmol/L (ref 22–32)
Calcium: 9.1 mg/dL (ref 8.9–10.3)
Chloride: 107 mmol/L (ref 98–111)
Creatinine: 1.08 mg/dL — ABNORMAL HIGH (ref 0.44–1.00)
GFR, Estimated: 52 mL/min — ABNORMAL LOW (ref >60)
Glucose, Bld: 188 mg/dL — ABNORMAL HIGH (ref 70–99)
Potassium: 3.9 mmol/L (ref 3.5–5.1)
Sodium: 141 mmol/L (ref 135–145)
Total Bilirubin: 0.3 mg/dL (ref 0.3–1.2)
Total Protein: 6.3 g/dL — ABNORMAL LOW (ref 6.5–8.1)

## 2021-11-22 LAB — SAVE SMEAR(SSMR), FOR PROVIDER SLIDE REVIEW

## 2021-11-22 LAB — CBC WITH DIFFERENTIAL (CANCER CENTER ONLY)
Abs Immature Granulocytes: 0.01 10*3/uL (ref 0.00–0.07)
Basophils Absolute: 0 10*3/uL (ref 0.0–0.1)
Basophils Relative: 1 %
Eosinophils Absolute: 0.2 10*3/uL (ref 0.0–0.5)
Eosinophils Relative: 4 %
HCT: 39.7 % (ref 36.0–46.0)
Hemoglobin: 11.9 g/dL — ABNORMAL LOW (ref 12.0–15.0)
Immature Granulocytes: 0 %
Lymphocytes Relative: 26 %
Lymphs Abs: 1.3 10*3/uL (ref 0.7–4.0)
MCH: 25.6 pg — ABNORMAL LOW (ref 26.0–34.0)
MCHC: 30 g/dL (ref 30.0–36.0)
MCV: 85.6 fL (ref 80.0–100.0)
Monocytes Absolute: 0.4 10*3/uL (ref 0.1–1.0)
Monocytes Relative: 8 %
Neutro Abs: 3.1 10*3/uL (ref 1.7–7.7)
Neutrophils Relative %: 61 %
Platelet Count: 287 10*3/uL (ref 150–400)
RBC: 4.64 MIL/uL (ref 3.87–5.11)
RDW: 23.3 % — ABNORMAL HIGH (ref 11.5–15.5)
WBC Count: 5.1 10*3/uL (ref 4.0–10.5)
nRBC: 0 % (ref 0.0–0.2)

## 2021-11-22 LAB — RETICULOCYTES
Immature Retic Fract: 10 % (ref 2.3–15.9)
RBC.: 4.62 MIL/uL (ref 3.87–5.11)
Retic Count, Absolute: 70.7 10*3/uL (ref 19.0–186.0)
Retic Ct Pct: 1.5 % (ref 0.4–3.1)

## 2021-11-22 LAB — IRON AND IRON BINDING CAPACITY (CC-WL,HP ONLY)
Iron: 106 ug/dL (ref 28–170)
Saturation Ratios: 30 % (ref 10.4–31.8)
TIBC: 354 ug/dL (ref 250–450)
UIBC: 248 ug/dL (ref 148–442)

## 2021-11-22 LAB — SAMPLE TO BLOOD BANK

## 2021-11-22 LAB — LACTATE DEHYDROGENASE: LDH: 197 U/L — ABNORMAL HIGH (ref 98–192)

## 2021-11-22 LAB — FERRITIN: Ferritin: 16 ng/mL (ref 11–307)

## 2021-11-22 NOTE — Telephone Encounter (Signed)
Per 11/22/21 los - called and gave upcoming appointments - confirmed 

## 2021-11-22 NOTE — Progress Notes (Signed)
Hematology/Oncology Consultation   Name: [name]      MRN: [id number]    Location: Room/bed info not found  Date: 11/22/2021 Time:12:02 PM   REFERRING PHYSICIAN:  Scarlette Shorts, MD  REASON FOR CONSULT: Iron deficiency anemia due to chronic blood loss    DIAGNOSIS: Iron deficiency anemia due to chronic blood loss   HISTORY OF PRESENT ILLNESS: [name] is a very pleasant 81 yo caucasian female with history of iron deficiency anemia.  She is scheduled for EGD with Dr. Henrene Pastor next week on Wednesday. She had 2 benign polyps removed with colonoscopy in 2020.  She has not noted any obvious blood loss. No abnormal bruising, no petechiae.  She is symptomatic with fatigue, weakness in her legs, chills and ice cravings. She has history of recurrent endometrial cancer diagnosed around 2010 and treated by Dr. Janie Morning. She had a total hysterectomy followed by 6 cycles of carbo/Paclitaxel and 28 fractions of radiation. She has had no other issues since.  Her sister had breast cancer  along with several other women on her father's side. She had a maternal uncle with history of pancreatic cancer and another maternal uncle with stomach cancer.  She takes 1 coated full dose aspirin PO daily.  No history of thyroid disease.  She is pre diabetic and states that she takes Metformin.  She has had no issue with frequent or recurrent infections. No fever, n/v, cough, dizziness, SOB, chest pain, palpitations, abdominal pain or changes in bowel or bladder habits.  She has eczema on her left leg and uses Cerave twice a day  She has occasional mild numbness and tingling in her fingertips.  No swelling or tenderness in her extremities.  No falls or syncope reported.  She quit smoking many years ago. Only the occasional glass of wine. No recreational drug use.  Appetite and hydration are good. Her weight is stable at 152 lbs.   ROS: All other 10 point review of systems is negative.   PAST MEDICAL  HISTORY:   Past Medical History:  Diagnosis Date   Allergy    Anxiety    takes ativan as needed   Arthritis    Benign positional vertigo    Bursitis of shoulder, left    Cancer (HCC)    Endometrial ca/Recurrence   CKD (chronic kidney disease), stage III ([place])    Depression    Diabetes ([place])    History of colonic polyps    Hypercholesteremia    Hypercholesterolemia    Hypertension    Lumbar pain    Osteoarthritis    Osteoporosis    S/P radiation therapy July 29, 2010-Sep 06, 2010   External beam of pelvis   S/P radiation therapy 09/15/10, 09/24/10, 09/28/2010   Intracavitary brachytherapy   SBO (small bowel obstruction) (HCC)    Status post chemotherapy    carboplatin/paclitaxel x 6 rounds    ALLERGIES: Allergies  Allergen Reactions   Phenobarbital     Feeling of uneasiness      MEDICATIONS:  Current Outpatient Medications on File Prior to Visit  Medication Sig Dispense Refill   aspirin 325 MG tablet Take 325 mg by mouth daily.     atorvastatin (LIPITOR) 40 MG tablet TK 1 T PO QHS  11   benazepril (LOTENSIN) 40 MG tablet Take 40 mg by mouth daily.  1   calcium carbonate (OS-CAL) 600 MG TABS tablet Take 600 mg by mouth daily with breakfast.     cetirizine (ZYRTEC) 10 MG  tablet Take 10 mg by mouth daily as needed.     cholecalciferol (VITAMIN D) 1000 UNITS tablet Take 5,000 Units by mouth daily.      cyclobenzaprine (FLEXERIL) 5 MG tablet Take 5 mg by mouth.     denosumab (PROLIA) 60 MG/ML SOLN injection Inject 60 mg into the skin every 6 (six) months. Administer in upper arm, thigh, or abdomen     ferrous sulfate 325 (65 FE) MG tablet Take 325 mg by mouth daily.      fexofenadine (ALLEGRA) 180 MG tablet Take 180 mg by mouth daily as needed.      Folic Acid-Vit N4-OEV O35 (FOLBEE) 2.5-25-1 MG TABS Take 1 tablet by mouth daily.     gabapentin (NEURONTIN) 100 MG capsule Take 1 capsule (100 mg total) by mouth 3 (three) times daily as needed. 30 capsule 1   ibuprofen  (ADVIL,MOTRIN) 200 MG tablet Take 200 mg by mouth every 6 (six) hours as needed.     loratadine (CLARITIN) 10 MG tablet Take 10 mg by mouth daily. Reported on 11/12/2015     LORazepam (ATIVAN) 0.5 MG tablet Take 0.25 mg by mouth as needed for anxiety.     meclizine (ANTIVERT) 25 MG tablet Take 25 mg by mouth 4 (four) times daily as needed for dizziness or nausea.     Multiple Vitamin (MULTIVITAMIN) tablet Take 1 tablet by mouth daily. Centrum silver     omeprazole (PRILOSEC) 40 MG capsule Take 1 capsule (40 mg total) by mouth daily. 90 capsule 3   Polyethyl Glycol-Propyl Glycol 0.4-0.3 % SOLN Place 1 drop into both eyes daily as needed (for dry eyes).     polyethylene glycol (MIRALAX / GLYCOLAX) packet Take 17 g by mouth daily.     No current facility-administered medications on file prior to visit.     PAST SURGICAL HISTORY Past Surgical History:  Procedure Laterality Date   ABDOMINAL HYSTERECTOMY     BREAST LUMPECTOMY  1993   Left breast, benign   ROBOTIC ASSISTED LAPAROSCOPIC VAGINAL HYSTERECTOMY WITH FIBROID REMOVAL  July 2010   bilat. salpingo-oophorectomy    FAMILY HISTORY: Family History  Problem Relation Age of Onset   Breast cancer Mother    Heart disease Father    Breast cancer Sister    Diabetes Sister    Colon cancer Neg Hx    Esophageal cancer Neg Hx    Rectal cancer Neg Hx    Stomach cancer Neg Hx     SOCIAL HISTORY:  reports that she quit smoking about 45 years ago. Her smoking use included cigarettes. She smoked an average of 1 pack per day. She has never used smokeless tobacco. She reports that she does not drink alcohol and does not use drugs.  PERFORMANCE STATUS: The patient's performance status is 1 - Symptomatic but completely ambulatory  PHYSICAL EXAM: Most Recent Vital Signs: There were no vitals taken for this visit. There were no vitals taken for this visit.  General Appearance:    Alert, cooperative, no distress, appears stated age  Head:     Normocephalic, without obvious abnormality, atraumatic  Eyes:    PERRL, conjunctiva/corneas clear, EOM's intact, fundi    benign, both eyes        Throat:   Lips, mucosa, and tongue normal; teeth and gums normal  Neck:   Supple, symmetrical, trachea midline, no adenopathy;    thyroid:  no enlargement/tenderness/nodules; no carotid   bruit or JVD  Back:     Symmetric,  no curvature, ROM normal, no CVA tenderness  Lungs:     Clear to auscultation bilaterally, respirations unlabored  Chest Wall:    No tenderness or deformity   Heart:    Regular rate and rhythm, S1 and S2 normal, no murmur, rub   or gallop     Abdomen:     Soft, non-tender, bowel sounds active all four quadrants,    no masses, no organomegaly        Extremities:   Extremities normal, atraumatic, no cyanosis or edema  Pulses:   2+ and symmetric all extremities  Skin:   Skin color, texture, turgor normal, no rashes or lesions  Lymph nodes:   Cervical, supraclavicular, and axillary nodes normal  Neurologic:   CNII-XII intact, normal strength, sensation and reflexes    throughout    LABORATORY DATA:  Results for orders placed or performed in visit on 11/22/21 (from the past 48 hour(s))  Save Smear for Provider Slide Review     Status: None   Collection Time: 11/22/21 10:42 AM  Result Value Ref Range   Smear Review SMEAR STAINED AND AVAILABLE FOR REVIEW     Comment: Performed at [facility] Lab at [facility], [address] [place], [place] [address]  CMP ([place] only)     Status: Abnormal   Collection Time: 11/22/21 10:42 AM  Result Value Ref Range   Sodium 141 135 - 145 mmol/L   Potassium 3.9 3.5 - 5.1 mmol/L   Chloride 107 98 - 111 mmol/L   CO2 26 22 - 32 mmol/L   Glucose, Bld 188 (H) 70 - 99 mg/dL    Comment: Glucose reference range applies only to samples taken after fasting for at least 8 hours.   BUN 14 8 - 23 mg/dL   Creatinine 1.08 (H) 0.44 - 1.00 mg/dL   Calcium 9.1 8.9 -  10.3 mg/dL   Total Protein 6.3 (L) 6.5 - 8.1 g/dL   Albumin 4.1 3.5 - 5.0 g/dL   AST 20 15 - 41 U/L   ALT 20 0 - 44 U/L   Alkaline Phosphatase 49 38 - 126 U/L   Total Bilirubin 0.3 0.3 - 1.2 mg/dL   GFR, Estimated 52 (L) >60 mL/min    Comment: (NOTE) Calculated using the CKD-EPI Creatinine Equation (2021)    Anion gap 8 5 - 15    Comment: Performed at [facility] at [facility], [address] [place], [place] [address]  CBC with Differential (Cancer Center Only)     Status: Abnormal   Collection Time: 11/22/21 10:42 AM  Result Value Ref Range   WBC Count 5.1 4.0 - 10.5 K/uL   RBC 4.64 3.87 - 5.11 MIL/uL   Hemoglobin 11.9 (L) 12.0 - 15.0 g/dL   HCT 39.7 36.0 - 46.0 %   MCV 85.6 80.0 - 100.0 fL   MCH 25.6 (L) 26.0 - 34.0 pg   MCHC 30.0 30.0 - 36.0 g/dL   RDW 23.3 (H) 11.5 - 15.5 %   Platelet Count 287 150 - 400 K/uL   nRBC 0.0 0.0 - 0.2 %   Neutrophils Relative % 61 %   Neutro Abs 3.1 1.7 - 7.7 K/uL   Lymphocytes Relative 26 %   Lymphs Abs 1.3 0.7 - 4.0 K/uL   Monocytes Relative 8 %   Monocytes Absolute 0.4 0.1 - 1.0 K/uL   Eosinophils Relative 4 %   Eosinophils Absolute 0.2  0.0 - 0.5 K/uL   Basophils Relative 1 %   Basophils Absolute 0.0 0.0 - 0.1 K/uL   Immature Granulocytes 0 %   Abs Immature Granulocytes 0.01 0.00 - 0.07 K/uL    Comment: Performed at [facility] Lab at [facility], [address], [place],  [address]  Reticulocytes     Status: None   Collection Time: 11/22/21 10:43 AM  Result Value Ref Range   Retic Ct Pct 1.5 0.4 - 3.1 %   RBC. 4.62 3.87 - 5.11 MIL/uL   Retic Count, Absolute 70.7 19.0 - 186.0 K/uL   Immature Retic Fract 10.0 2.3 - 15.9 %    Comment: Performed at [facility] Lab at [facility], [address], [place], [place] [address]  Lactate dehydrogenase (LDH)     Status: Abnormal   Collection Time: 11/22/21 10:43 AM  Result Value Ref Range   LDH 197 (H) 98 -  192 U/L    Comment: Performed at [facility] Lab at [facility], [address] [place],  [address]      RADIOGRAPHY: No results found.     PATHOLOGY: None  ASSESSMENT/PLAN: Ms. Lockner is a very pleasant 81 yo caucasian female with history of iron deficiency anemia.  Iron studies and epo are pending.  We will get her set up for IV iron if needed.  Follow-up in 8 weeks.   All questions were answered. The patient knows to call the clinic with any problems, questions or concerns. We can certainly see the patient much sooner if necessary.  The patient was discussed with Dr. Marin Olp and he is in agreement with the aforementioned.   Lottie Dawson, NP

## 2021-11-23 LAB — ERYTHROPOIETIN: Erythropoietin: 22.5 m[IU]/mL — ABNORMAL HIGH (ref 2.6–18.5)

## 2021-11-24 ENCOUNTER — Other Ambulatory Visit: Payer: Self-pay | Admitting: Family

## 2021-11-24 ENCOUNTER — Telehealth: Payer: Self-pay | Admitting: *Deleted

## 2021-11-24 NOTE — Telephone Encounter (Signed)
Per scheduling message Judson Roch - called and gave upcoming appointments - confirmed (1) dose of IV Iron

## 2021-11-25 ENCOUNTER — Other Ambulatory Visit: Payer: Self-pay

## 2021-11-25 ENCOUNTER — Emergency Department (HOSPITAL_BASED_OUTPATIENT_CLINIC_OR_DEPARTMENT_OTHER): Payer: Medicare Other

## 2021-11-25 ENCOUNTER — Emergency Department (HOSPITAL_BASED_OUTPATIENT_CLINIC_OR_DEPARTMENT_OTHER)
Admission: EM | Admit: 2021-11-25 | Discharge: 2021-11-25 | Disposition: A | Discharge status: Home / Self Care | Payer: Medicare Other | Attending: Emergency Medicine | Admitting: Emergency Medicine

## 2021-11-25 ENCOUNTER — Encounter (HOSPITAL_BASED_OUTPATIENT_CLINIC_OR_DEPARTMENT_OTHER): Payer: Self-pay | Admitting: Emergency Medicine

## 2021-11-25 ENCOUNTER — Inpatient Hospital Stay: Payer: Medicare Other | Attending: Hematology & Oncology

## 2021-11-25 VITALS — BP 132/60 | HR 66 | Temp 98.3°F | Resp 18

## 2021-11-25 DIAGNOSIS — N183 Chronic kidney disease, stage 3 unspecified: Secondary | ICD-10-CM | POA: Diagnosis not present

## 2021-11-25 DIAGNOSIS — Z79899 Other long term (current) drug therapy: Secondary | ICD-10-CM | POA: Insufficient documentation

## 2021-11-25 DIAGNOSIS — M545 Low back pain, unspecified: Secondary | ICD-10-CM | POA: Diagnosis not present

## 2021-11-25 DIAGNOSIS — M25552 Pain in left hip: Secondary | ICD-10-CM | POA: Diagnosis not present

## 2021-11-25 DIAGNOSIS — S0990XA Unspecified injury of head, initial encounter: Secondary | ICD-10-CM | POA: Diagnosis not present

## 2021-11-25 DIAGNOSIS — W108XXA Fall (on) (from) other stairs and steps, initial encounter: Secondary | ICD-10-CM | POA: Insufficient documentation

## 2021-11-25 DIAGNOSIS — W19XXXA Unspecified fall, initial encounter: Secondary | ICD-10-CM | POA: Diagnosis not present

## 2021-11-25 DIAGNOSIS — R109 Unspecified abdominal pain: Secondary | ICD-10-CM | POA: Diagnosis not present

## 2021-11-25 DIAGNOSIS — I129 Hypertensive chronic kidney disease with stage 1 through stage 4 chronic kidney disease, or unspecified chronic kidney disease: Secondary | ICD-10-CM | POA: Diagnosis not present

## 2021-11-25 DIAGNOSIS — R102 Pelvic and perineal pain: Secondary | ICD-10-CM | POA: Diagnosis not present

## 2021-11-25 DIAGNOSIS — Z8542 Personal history of malignant neoplasm of other parts of uterus: Secondary | ICD-10-CM | POA: Diagnosis not present

## 2021-11-25 DIAGNOSIS — R6889 Other general symptoms and signs: Secondary | ICD-10-CM | POA: Diagnosis not present

## 2021-11-25 DIAGNOSIS — D500 Iron deficiency anemia secondary to blood loss (chronic): Secondary | ICD-10-CM

## 2021-11-25 DIAGNOSIS — D509 Iron deficiency anemia, unspecified: Secondary | ICD-10-CM | POA: Diagnosis not present

## 2021-11-25 DIAGNOSIS — E1122 Type 2 diabetes mellitus with diabetic chronic kidney disease: Secondary | ICD-10-CM | POA: Insufficient documentation

## 2021-11-25 DIAGNOSIS — I700 Atherosclerosis of aorta: Secondary | ICD-10-CM | POA: Diagnosis not present

## 2021-11-25 DIAGNOSIS — M5030 Other cervical disc degeneration, unspecified cervical region: Secondary | ICD-10-CM | POA: Diagnosis not present

## 2021-11-25 DIAGNOSIS — Z043 Encounter for examination and observation following other accident: Secondary | ICD-10-CM | POA: Diagnosis not present

## 2021-11-25 MED ORDER — SODIUM CHLORIDE 0.9 % IV SOLN
1000.0000 mg | Freq: Once | INTRAVENOUS | Status: AC
  Administered 2021-11-25: 1000 mg via INTRAVENOUS
  Filled 2021-11-25: qty 10

## 2021-11-25 MED ORDER — METHOCARBAMOL 500 MG PO TABS
500.0000 mg | ORAL_TABLET | Freq: Once | ORAL | Status: AC
  Administered 2021-11-25: 500 mg via ORAL
  Filled 2021-11-25: qty 1

## 2021-11-25 MED ORDER — ACETAMINOPHEN 500 MG PO TABS
1000.0000 mg | ORAL_TABLET | Freq: Once | ORAL | Status: AC
Start: 2021-11-25 — End: 2021-11-25
  Administered 2021-11-25: 1000 mg via ORAL
  Filled 2021-11-25: qty 2

## 2021-11-25 MED ORDER — LIDOCAINE 5 % EX OINT: 1.0000 | TOPICAL_OINTMENT | Freq: Three times a day (TID) | CUTANEOUS | 0 refills | Status: DC | PRN

## 2021-11-25 MED ORDER — LIDOCAINE 5 % EX PTCH
1.0000 | MEDICATED_PATCH | CUTANEOUS | Status: DC
  Administered 2021-11-25: 1 via TRANSDERMAL
  Filled 2021-11-25: qty 1

## 2021-11-25 MED ORDER — SODIUM CHLORIDE 0.9 % IV SOLN: Freq: Once | INTRAVENOUS | Status: AC

## 2021-11-25 NOTE — Discharge Instructions (Signed)

## 2021-11-25 NOTE — ED Provider Notes (Signed)
Emergency Department Provider Note   I have reviewed the triage vital signs and the nursing notes.   HISTORY  Chief Complaint Fall   HPI [name] is a 81 y.o. female presents to the ED for evaluation after a fall down several steps this AM. She lives at home with her daughter. While walking down the steps, she reports sliding and falling on her buttocks down several steps to the bottom. Does not recall head trauma. No numbness/weakness. No severe back or neck pain. Notes that most of her pain is in the left hip and flank areas.    Past Medical History:  Diagnosis Date   Allergy    Anxiety    takes ativan as needed   Arthritis    Benign positional vertigo    Bursitis of shoulder, left    Cancer [facility]    Endometrial ca/Recurrence   CKD (chronic kidney disease), stage III (HCC)    Depression    Diabetes ([place])    History of colonic polyps    Hypercholesteremia    Hypercholesterolemia    Hypertension    Lumbar pain    Osteoarthritis    Osteoporosis    S/P radiation therapy July 29, 2010-Sep 06, 2010   External beam of pelvis   S/P radiation therapy 09/15/10, 09/24/10, 09/28/2010   Intracavitary brachytherapy   SBO (small bowel obstruction) (HCC)    Status post chemotherapy    carboplatin/paclitaxel x 6 rounds    Review of Systems  Constitutional: No fever/chills Cardiovascular: Denies chest pain. Respiratory: Denies shortness of breath. Gastrointestinal: No abdominal pain. Genitourinary: Negative for dysuria. Musculoskeletal: Negative for back pain. Positive left hip and flank pain.  Skin: Negative for rash. Neurological: Negative for headaches, focal weakness or numbness.   ____________________________________________   PHYSICAL EXAM:  VITAL SIGNS: ED Triage Vitals  Enc Vitals Group     BP 11/25/21 0948 (!) 151/85     Pulse Rate 11/25/21 0948 80     Resp --      Temp 11/25/21 0948 98.4 F (36.9 C)     Temp Source 11/25/21 0948 Oral     SpO2  11/25/21 0948 99 %     Weight 11/25/21 0948 153 lb (69.4 kg)     Height 11/25/21 0948 '5\' 3"'$  (1.6 m)   Constitutional: Alert and oriented. Well appearing and in no acute distress. Eyes: Conjunctivae are normal. PERRL. EOMI. Head: Atraumatic. Nose: No congestion/rhinnorhea. Mouth/Throat: Mucous membranes are moist.   Neck: No stridor. No cervical spine tenderness to palpation. Cardiovascular: Normal rate, regular rhythm. Good peripheral circulation. Grossly normal heart sounds.   Respiratory: Normal respiratory effort.  No retractions. Lungs CTAB. Gastrointestinal: Soft and nontender. No distention.  Musculoskeletal: No lower extremity tenderness nor edema. No gross deformities of extremities. Normal ROM of the bilateral upper extremities. No bony tenderness to the thoracic or lumbar spine.  Neurologic:  Normal speech and language. No gross focal neurologic deficits are appreciated.  Skin:  Skin is warm, dry and intact. No rash noted.   ____________________________________________   PROCEDURES  Procedure(s) performed:   Procedures  None ____________________________________________   INITIAL IMPRESSION / ASSESSMENT AND PLAN / ED COURSE  Pertinent labs & imaging results that were available during my care of the patient were reviewed by me and considered in my medical decision making (see chart for details).   This patient is Presenting for Evaluation of fall/head trauma, which does require a range of treatment options, and is a complaint that involves  a high risk of morbidity and mortality.  The Differential Diagnoses includes subdural hematoma, epidural hematoma, acute concussion, traumatic subarachnoid hemorrhage, cerebral contusions, etc.   I did obtain Additional Historical Information from daughter at bedside, patient is at her mental status baseline since the fall.  I decided to review pertinent External Data, and in summary no recent ED visits for similar.   Clinical  Laboratory Tests: Considered labs but fall was mechanical and no AMS noted. Defer labs for now.   Radiologic Tests Ordered, included CT head and c-spine along with plain films of chest and pelvis. I independently interpreted the images and agree with radiology interpretation.   Social Determinants of Health Risk no active smoking and lives at home with family.   Medical Decision Making: Summary:  Patient presents to the ED with a mechanical slip and fall at home this AM. Reassuring imaging w/u here. No abdominal tenderness. Patient with some stiffness on exam but no focal bony tenderness or reduced ROM.   Reevaluation with update and discussion with patient and daughter to review ED w/u thus far. Labs are reassuring. Patient will d/c and make it to her iron infusion appointment today with plan for close PCP follow up.  Disposition: discharge  ____________________________________________  FINAL CLINICAL IMPRESSION(S) / ED DIAGNOSES  Final diagnoses:  Fall, initial encounter  Acute left-sided low back pain without sciatica     NEW OUTPATIENT MEDICATIONS STARTED DURING THIS VISIT:  Discharge Medication List as of 11/25/2021 11:46 AM     START taking these medications   Details  lidocaine (XYLOCAINE) 5 % ointment Apply 1 Application topically 3 (three) times daily as needed for moderate pain., Starting Thu 11/25/2021, Normal        Note:  This document was prepared using Dragon voice recognition software and may include unintentional dictation errors.  Nanda Quinton, MD, [facility] Emergency Medicine    Janele Lague, [facility], MD 11/29/21 [phone]

## 2021-11-25 NOTE — ED Triage Notes (Signed)
Pt fell while going down stairs.  Pt fell down few steps.  No head injury, no thinners.  C/o pain to left flank and hip

## 2021-11-25 NOTE — Patient Instructions (Signed)

## 2021-12-01 ENCOUNTER — Ambulatory Visit (AMBULATORY_SURGERY_CENTER): Payer: Medicare Other | Admitting: Internal Medicine

## 2021-12-01 ENCOUNTER — Encounter: Payer: Self-pay | Admitting: Internal Medicine

## 2021-12-01 VITALS — BP 153/62 | HR 67 | Temp 98.2°F | Resp 18 | Ht 65.0 in | Wt 153.0 lb

## 2021-12-01 DIAGNOSIS — K449 Diaphragmatic hernia without obstruction or gangrene: Secondary | ICD-10-CM | POA: Diagnosis not present

## 2021-12-01 DIAGNOSIS — K219 Gastro-esophageal reflux disease without esophagitis: Secondary | ICD-10-CM

## 2021-12-01 DIAGNOSIS — R1310 Dysphagia, unspecified: Secondary | ICD-10-CM

## 2021-12-01 DIAGNOSIS — F32A Depression, unspecified: Secondary | ICD-10-CM | POA: Diagnosis not present

## 2021-12-01 DIAGNOSIS — E119 Type 2 diabetes mellitus without complications: Secondary | ICD-10-CM | POA: Diagnosis not present

## 2021-12-01 DIAGNOSIS — D500 Iron deficiency anemia secondary to blood loss (chronic): Secondary | ICD-10-CM

## 2021-12-01 DIAGNOSIS — I10 Essential (primary) hypertension: Secondary | ICD-10-CM | POA: Diagnosis not present

## 2021-12-01 DIAGNOSIS — D509 Iron deficiency anemia, unspecified: Secondary | ICD-10-CM | POA: Diagnosis not present

## 2021-12-01 DIAGNOSIS — F419 Anxiety disorder, unspecified: Secondary | ICD-10-CM | POA: Diagnosis not present

## 2021-12-01 MED ORDER — SODIUM CHLORIDE 0.9 % IV SOLN: 500.0000 mL | Freq: Once | INTRAVENOUS | Status: DC

## 2021-12-01 NOTE — Progress Notes (Signed)
HISTORY OF PRESENT ILLNESS:   [name] is a 81 y.o. 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  She is accompanied by her daughter [name].  I last saw the patient in the office May 01, 2018 regarding iron deficiency anemia and Hemoccult positive stool.  She does have a history of endometrial cancer and is status post surgery followed by chemoradiation therapy.  See that dictation for details.  She subsequently underwent colonoscopy and upper endoscopy May 09, 2018.  Colonoscopy, including intubation of the terminal ileum was normal except for diminutive colon polyps which were benign and removed.  Upper endoscopy was unremarkable.  She did have a small hiatal hernia.  Duodenal biopsies were negative.  Capsule endoscopy was not performed due to her prior history of surgery and radiation.  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  She was told to take iron indefinitely and follow-up with her PCP.  Recent blood work from October 05, 2021 showed hemoglobin of 9.5 with MCV 77 6.  Patient and her daughter tell me that she has been unable to tolerate iron previously, thus she did not take iron.  She was recently placed back on iron therapy as well as Prilosec OTC for GERD symptoms.  She does take Advil and aspirin.  She denies abdominal pain, melena, or hematochezia.  She has been feeling tired.  GI review of systems is otherwise negative   REVIEW OF SYSTEMS:   All non-GI ROS negative unless otherwise stated in the HPI except for arthritis, sinus and allergy, excessive urination, occasional ankle edema       Past Medical History:  Diagnosis Date   Allergy     Anxiety      takes ativan as needed   Arthritis     Benign positional vertigo     Bursitis of shoulder, left     Cancer (HCC)      Endometrial ca/Recurrence    CKD (chronic kidney disease), stage III (HCC)     Depression     Diabetes ([place])     History of colonic polyps     Hypercholesteremia     Hypercholesterolemia     Hypertension     Lumbar pain     Osteoarthritis     Osteoporosis     S/P radiation therapy July 29, 2010-Sep 06, 2010    External beam of pelvis   S/P radiation therapy 09/15/10, 09/24/10, 09/28/2010    Intracavitary brachytherapy   SBO (small bowel obstruction) ([place])     Status post chemotherapy      carboplatin/paclitaxel x 6 rounds           Past Surgical History:  Procedure Laterality Date   ABDOMINAL HYSTERECTOMY       BREAST LUMPECTOMY   1993    Left breast, benign   ROBOTIC ASSISTED LAPAROSCOPIC VAGINAL HYSTERECTOMY WITH FIBROID REMOVAL   July 2010    bilat. salpingo-oophorectomy      Social History [name]  reports that she quit smoking about 45 years ago. Her smoking use included cigarettes. She smoked an average of 1 pack per day. She has never used smokeless tobacco. She reports that she does not drink alcohol and does not use drugs.   family history includes Breast cancer in her mother and sister; Diabetes in her sister; Heart disease in her father.  Allergies  Allergen Reactions   Phenobarbital        Feeling of uneasiness          PHYSICAL EXAMINATION: Vital signs: BP 136/72   Pulse 75   Ht '5\' 5"'$  (1.651 m)   Wt 153 lb (69.4 kg)   SpO2 97%   BMI 25.46 kg/m   Constitutional: generally well-appearing, no acute distress Psychiatric: alert and oriented x3, cooperative Eyes: extraocular movements intact, anicteric, conjunctiva pink Mouth: oral pharynx moist, no lesions Neck: supple no lymphadenopathy Cardiovascular: heart regular rate and rhythm, no murmur Lungs: clear to auscultation bilaterally Abdomen: soft, nontender, nondistended, no obvious ascites, no peritoneal signs, normal bowel sounds, no organomegaly Rectal: Omitted Extremities: no clubbing or cyanosis.  Trace lower  extremity edema bilaterally Skin: no lesions on visible extremities Neuro: No focal deficits.  Cranial nerves intact   ASSESSMENT:   1.  Iron deficiency anemia.  Previously worked up as described.  Has not had adequate iron replacement. 2.  Intolerant to oral iron 3.  GERD with mild dysphagia.  Now on PPI 4.  Chronic aspirin/NSAIDs. 5.  Colonoscopy 2020 with diminutive polyp 6.  EGD 2020 was unremarkable 7.  General medical problems.  Stable     PLAN:   1.  Continue PPI for GERD symptoms. 2.  Prescribed omeprazole 40 mg daily.  Medication risks reviewed 3.  Refer to hematology for IV iron replacement therapy and chronic monitoring of blood counts and iron levels 4.  Schedule upper endoscopy with possible esophageal dilation to evaluate GERD, dysphagia, and iron deficiency anemia the patient on NSAIDs.  Rule out interval change from 3+ years ago.  Patient is higher than baseline risk due to her age.  We willThe nature of the procedure, as well as the risks, benefits, and alternatives were carefully and thoroughly reviewed with the patient. Ample time for discussion and questions allowed. The patient understood, was satisfied, and agreed to proceed.

## 2021-12-01 NOTE — Op Note (Signed)
[place] Patient Name: [name] Procedure Date: 12/01/2021 10:21 AM MRN: [id number] Endoscopist: Docia Chuck. Henrene Pastor , MD Age: 81 Referring MD:  Date of Birth: 12-01-1940 Gender: Female Account #: [id number] Procedure:                Upper GI endoscopy Indications:              Iron deficiency anemia, Dysphagia (mild),                            Esophageal reflux Medicines:                Monitored Anesthesia Care Procedure:                Pre-Anesthesia Assessment:                           - Prior to the procedure, a History and Physical                            was performed, and patient medications and                            allergies were reviewed. The patient's tolerance of                            previous anesthesia was also reviewed. The risks                            and benefits of the procedure and the sedation                            options and risks were discussed with the patient.                            All questions were answered, and informed consent                            was obtained. Prior Anticoagulants: The patient has                            taken no previous anticoagulant or antiplatelet                            agents. ASA Grade Assessment: II - A patient with                            mild systemic disease. After reviewing the risks                            and benefits, the patient was deemed in                            satisfactory condition to undergo the procedure.  After obtaining informed consent, the endoscope was                            passed under direct vision. Throughout the                            procedure, the patient's blood pressure, pulse, and                            oxygen saturations were monitored continuously. The                            GIF [id number] #[id number] was introduced through the                            mouth, and advanced to the second part of  duodenum.                            The upper GI endoscopy was accomplished without                            difficulty. The patient tolerated the procedure                            well. Scope In: Scope Out: Findings:                 The esophagus was normal.                           The stomach was normal. Small hiatal hernia.                           The examined duodenum was normal.                           The cardia and gastric fundus were normal on                            retroflexion. Complications:            No immediate complications. Estimated Blood Loss:     Estimated blood loss: none. Impression:               1. Normal EGD.                           2. GERD                           3. 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 Recommendation:           - Patient has a contact number available for  emergencies. The signs and symptoms of potential                            delayed complications were discussed with the                            patient. Return to normal activities tomorrow.                            Written discharge instructions were provided to the                            patient.                           - Resume previous diet.                           - Continue present medications, including                            omeprazole for GERD                           - Continue follow-up with hematology regarding                            monitoring of blood counts and iron infusions as                            needed                           - GI follow-up as needed. Docia Chuck. Henrene Pastor, MD 12/01/2021 10:44:27 AM This report has been signed electronically.

## 2021-12-01 NOTE — Patient Instructions (Signed)
YOU HAD AN ENDOSCOPIC PROCEDURE TODAY AT THE  ENDOSCOPY CENTER:   Refer to the procedure report that was given to you for any specific questions about what was found during the examination.  If the procedure report does not answer your questions, please call your gastroenterologist to clarify.  If you requested that your care partner not be given the details of your procedure findings, then the procedure report has been included in a sealed envelope for you to review at your convenience later.  YOU SHOULD EXPECT: Some feelings of bloating in the abdomen. Passage of more gas than usual.  Walking can help get rid of the air that was put into your GI tract during the procedure and reduce the bloating. If you had a lower endoscopy (such as a colonoscopy or flexible sigmoidoscopy) you may notice spotting of blood in your stool or on the toilet paper. If you underwent a bowel prep for your procedure, you may not have a normal bowel movement for a few days.  Please Note:  You might notice some irritation and congestion in your nose or some drainage.  This is from the oxygen used during your procedure.  There is no need for concern and it should clear up in a day or so.  SYMPTOMS TO REPORT IMMEDIATELY:  Following upper endoscopy (EGD)  Vomiting of blood or coffee ground material  New chest pain or pain under the shoulder blades  Painful or persistently difficult swallowing  New shortness of breath  Fever of 100F or higher  Black, tarry-looking stools  For urgent or emergent issues, a gastroenterologist can be reached at any hour by calling [phone]. Do not use MyChart messaging for urgent concerns.    DIET:  We do recommend a small meal at first, but then you may proceed to your regular diet.  Drink plenty of fluids but you should avoid alcoholic beverages for 24 hours.  ACTIVITY:  You should plan to take it easy for the rest of today and you should NOT DRIVE or use heavy machinery until  tomorrow (because of the sedation medicines used during the test).    FOLLOW UP: Our staff will call the number listed on your records the next business day following your procedure.  We will call around 7:15- 8:00 am to check on you and address any questions or concerns that you may have regarding the information given to you following your procedure. If we do not reach you, we will leave a message.  If you develop any symptoms (ie: fever, flu-like symptoms, shortness of breath, cough etc.) before then, please call [phone].  If you test positive for Covid 19 in the 2 weeks post procedure, please call and report this information to us.    If any biopsies were taken you will be contacted by phone or by letter within the next 1-3 weeks.  Please call us at [phone] if you have not heard about the biopsies in 3 weeks.    SIGNATURES/CONFIDENTIALITY: You and/or your care partner have signed paperwork which will be entered into your electronic medical record.  These signatures attest to the fact that that the information above on your After Visit Summary has been reviewed and is understood.  Full responsibility of the confidentiality of this discharge information lies with you and/or your care-partner.  

## 2021-12-01 NOTE — Progress Notes (Signed)
Sedate, gd SR, tolerated procedure well, VSS, report to RN 

## 2021-12-02 ENCOUNTER — Telehealth: Payer: Self-pay

## 2021-12-02 NOTE — Telephone Encounter (Signed)
  Follow up Call-     12/01/2021    9:46 AM  Call back number  Post procedure Call Back phone  # [phone]  Permission to leave phone message Yes     Patient questions:  Do you have a fever, pain , or abdominal swelling? No. Pain Score  0 *  Have you tolerated food without any problems? Yes.    Have you been able to return to your normal activities? Yes.    Do you have any questions about your discharge instructions: Diet   No. Medications  No. Follow up visit  No.  Do you have questions or concerns about your Care? No.  Actions: * If pain score is 4 or above: No action needed, pain <4.

## 2021-12-13 DIAGNOSIS — M810 Age-related osteoporosis without current pathological fracture: Secondary | ICD-10-CM | POA: Diagnosis not present

## 2021-12-13 DIAGNOSIS — W19XXXA Unspecified fall, initial encounter: Secondary | ICD-10-CM | POA: Diagnosis not present

## 2021-12-13 DIAGNOSIS — M545 Low back pain, unspecified: Secondary | ICD-10-CM | POA: Diagnosis not present

## 2021-12-13 DIAGNOSIS — M5030 Other cervical disc degeneration, unspecified cervical region: Secondary | ICD-10-CM | POA: Diagnosis not present

## 2021-12-13 DIAGNOSIS — M1990 Unspecified osteoarthritis, unspecified site: Secondary | ICD-10-CM | POA: Diagnosis not present

## 2021-12-13 DIAGNOSIS — I129 Hypertensive chronic kidney disease with stage 1 through stage 4 chronic kidney disease, or unspecified chronic kidney disease: Secondary | ICD-10-CM | POA: Diagnosis not present

## 2021-12-13 DIAGNOSIS — S329XXS Fracture of unspecified parts of lumbosacral spine and pelvis, sequela: Secondary | ICD-10-CM | POA: Diagnosis not present

## 2021-12-16 DIAGNOSIS — E119 Type 2 diabetes mellitus without complications: Secondary | ICD-10-CM | POA: Diagnosis not present

## 2022-01-07 DIAGNOSIS — M1990 Unspecified osteoarthritis, unspecified site: Secondary | ICD-10-CM | POA: Diagnosis not present

## 2022-01-07 DIAGNOSIS — C541 Malignant neoplasm of endometrium: Secondary | ICD-10-CM | POA: Diagnosis not present

## 2022-01-07 DIAGNOSIS — M810 Age-related osteoporosis without current pathological fracture: Secondary | ICD-10-CM | POA: Diagnosis not present

## 2022-01-07 DIAGNOSIS — F418 Other specified anxiety disorders: Secondary | ICD-10-CM | POA: Diagnosis not present

## 2022-01-07 DIAGNOSIS — E7800 Pure hypercholesterolemia, unspecified: Secondary | ICD-10-CM | POA: Diagnosis not present

## 2022-01-07 DIAGNOSIS — N1831 Chronic kidney disease, stage 3a: Secondary | ICD-10-CM | POA: Diagnosis not present

## 2022-01-07 DIAGNOSIS — I129 Hypertensive chronic kidney disease with stage 1 through stage 4 chronic kidney disease, or unspecified chronic kidney disease: Secondary | ICD-10-CM | POA: Diagnosis not present

## 2022-01-07 DIAGNOSIS — E1129 Type 2 diabetes mellitus with other diabetic kidney complication: Secondary | ICD-10-CM | POA: Diagnosis not present

## 2022-01-07 DIAGNOSIS — D509 Iron deficiency anemia, unspecified: Secondary | ICD-10-CM | POA: Diagnosis not present

## 2022-01-07 DIAGNOSIS — H8110 Benign paroxysmal vertigo, unspecified ear: Secondary | ICD-10-CM | POA: Diagnosis not present

## 2022-01-07 DIAGNOSIS — E46 Unspecified protein-calorie malnutrition: Secondary | ICD-10-CM | POA: Diagnosis not present

## 2022-01-07 DIAGNOSIS — R809 Proteinuria, unspecified: Secondary | ICD-10-CM | POA: Diagnosis not present

## 2022-01-17 ENCOUNTER — Other Ambulatory Visit: Payer: Self-pay

## 2022-01-17 ENCOUNTER — Encounter: Payer: Self-pay | Admitting: Family

## 2022-01-17 ENCOUNTER — Inpatient Hospital Stay (HOSPITAL_BASED_OUTPATIENT_CLINIC_OR_DEPARTMENT_OTHER): Payer: Medicare Other | Admitting: Family

## 2022-01-17 ENCOUNTER — Inpatient Hospital Stay: Payer: Medicare Other | Attending: Hematology & Oncology

## 2022-01-17 VITALS — BP 173/57 | HR 75 | Temp 97.8°F | Resp 18 | Ht 65.0 in | Wt 149.0 lb

## 2022-01-17 DIAGNOSIS — D509 Iron deficiency anemia, unspecified: Secondary | ICD-10-CM | POA: Diagnosis not present

## 2022-01-17 DIAGNOSIS — D500 Iron deficiency anemia secondary to blood loss (chronic): Secondary | ICD-10-CM

## 2022-01-17 LAB — CBC WITH DIFFERENTIAL (CANCER CENTER ONLY)
Abs Immature Granulocytes: 0.03 10*3/uL (ref 0.00–0.07)
Basophils Absolute: 0 10*3/uL (ref 0.0–0.1)
Basophils Relative: 1 %
Eosinophils Absolute: 0.2 10*3/uL (ref 0.0–0.5)
Eosinophils Relative: 4 %
HCT: 41.9 % (ref 36.0–46.0)
Hemoglobin: 13.3 g/dL (ref 12.0–15.0)
Immature Granulocytes: 1 %
Lymphocytes Relative: 30 %
Lymphs Abs: 1.5 10*3/uL (ref 0.7–4.0)
MCH: 28.9 pg (ref 26.0–34.0)
MCHC: 31.7 g/dL (ref 30.0–36.0)
MCV: 90.9 fL (ref 80.0–100.0)
Monocytes Absolute: 0.4 10*3/uL (ref 0.1–1.0)
Monocytes Relative: 8 %
Neutro Abs: 2.8 10*3/uL (ref 1.7–7.7)
Neutrophils Relative %: 56 %
Platelet Count: 257 10*3/uL (ref 150–400)
RBC: 4.61 MIL/uL (ref 3.87–5.11)
RDW: 17.3 % — ABNORMAL HIGH (ref 11.5–15.5)
WBC Count: 4.9 10*3/uL (ref 4.0–10.5)
nRBC: 0 % (ref 0.0–0.2)

## 2022-01-17 LAB — IRON AND IRON BINDING CAPACITY (CC-WL,HP ONLY)
Iron: 60 ug/dL (ref 28–170)
Saturation Ratios: 22 % (ref 10.4–31.8)
TIBC: 269 ug/dL (ref 250–450)
UIBC: 209 ug/dL (ref 148–442)

## 2022-01-17 LAB — RETICULOCYTES
Immature Retic Fract: 10.9 % (ref 2.3–15.9)
RBC.: 4.64 MIL/uL (ref 3.87–5.11)
Retic Count, Absolute: 78.4 10*3/uL (ref 19.0–186.0)
Retic Ct Pct: 1.7 % (ref 0.4–3.1)

## 2022-01-17 LAB — FERRITIN: Ferritin: 128 ng/mL (ref 11–307)

## 2022-01-17 NOTE — Progress Notes (Signed)
Hematology and Oncology Follow Up Visit  [name] [id number] 21-Jul-1940 81 y.o. 01/17/2022   Principle Diagnosis:  Iron deficiency anemia   Current Therapy:   IV iron as indicated    Interim History:  [name] is here today for follow-up. She notes a great improvement in energy since receiving IV iron.  No blood loss, abnormal bruising or petechiae noted.  No fever, chills, n/v, cough, rash, dizziness, SOB, chest pain, palpitations, abdominal pain or changes in bowel or bladder habits.  No swelling, tenderness, numbness or tingling in her extremities.  No falls or syncope reported.  Appetite and hydration are good. Her weight is stable at 149 lbs.   ECOG Performance Status: 1 - Symptomatic but completely ambulatory  Medications:  Allergies as of 01/17/2022       Reactions   Phenobarbital Other (See Comments)   Feeling of uneasiness        Medication List        Accurate as of January 17, 2022 11:14 AM. If you have any questions, ask your nurse or doctor.          STOP taking these medications    ferrous sulfate 325 (65 FE) MG tablet Stopped by: Jessica Dawson, NP   fexofenadine 180 MG tablet Commonly known as: ALLEGRA Stopped by: Jessica Dawson, NP   lidocaine 5 % ointment Commonly known as: XYLOCAINE Stopped by: Jessica Dawson, NP   loratadine 10 MG tablet Commonly known as: CLARITIN Stopped by: Jessica Dawson, NP   methocarbamol 500 MG tablet Commonly known as: ROBAXIN Stopped by: Jessica Dawson, NP   polyethylene glycol 17 g packet Commonly known as: MIRALAX / GLYCOLAX Stopped by: Jessica Dawson, NP       TAKE these medications    aspirin 325 MG tablet Take 325 mg by mouth daily.   atorvastatin 40 MG tablet Commonly known as: LIPITOR TK 1 T PO QHS   benazepril 40 MG tablet Commonly known as: LOTENSIN Take 40 mg by mouth daily.   calcium carbonate 600 MG Tabs tablet Commonly known as: OS-CAL Take 600 mg by mouth daily with  breakfast.   cetirizine 10 MG tablet Commonly known as: ZYRTEC Take 10 mg by mouth daily as needed.   cholecalciferol 1000 units tablet Commonly known as: VITAMIN D Take 5,000 Units by mouth daily.   cyclobenzaprine 5 MG tablet Commonly known as: FLEXERIL Take 5 mg by mouth.   denosumab 60 MG/ML Soln injection Commonly known as: PROLIA Inject 60 mg into the skin every 6 (six) months. Administer in upper arm, thigh, or abdomen   Folic Acid-Vit V4-QVZ D63 2.5-25-1 MG Tabs tablet Commonly known as: FOLBEE Take 1 tablet by mouth daily.   gabapentin 100 MG capsule Commonly known as: NEURONTIN Take 1 capsule (100 mg total) by mouth 3 (three) times daily as needed.   ibuprofen 200 MG tablet Commonly known as: ADVIL Take 200 mg by mouth every 6 (six) hours as needed.   LORazepam 0.5 MG tablet Commonly known as: ATIVAN Take 0.25 mg by mouth as needed for anxiety.   meclizine 25 MG tablet Commonly known as: ANTIVERT Take 25 mg by mouth 4 (four) times daily as needed for dizziness or nausea.   multivitamin tablet Take 1 tablet by mouth daily. Centrum silver   omeprazole 40 MG capsule Commonly known as: PRILOSEC Take 1 capsule (40 mg total) by mouth daily.   Polyethyl Glycol-Propyl Glycol 0.4-0.3 % Soln Place 1 drop into both eyes daily as needed (  for dry eyes).        Allergies:  Allergies  Allergen Reactions   Phenobarbital Other (See Comments)    Feeling of uneasiness    Past Medical History, Surgical history, Social history, and Family History were reviewed and updated.  Review of Systems: All other 10 point review of systems is negative.   Physical Exam:  height is '5\' 5"'$  (1.651 m) and weight is 149 lb (67.6 kg). Her oral temperature is 97.8 F (36.6 C). Her blood pressure is 173/57 (abnormal) and her pulse is 75. Her respiration is 18 and oxygen saturation is 100%.   Wt Readings from Last 3 Encounters:  01/17/22 149 lb (67.6 kg)  12/01/21 153 lb (69.4  kg)  11/25/21 153 lb (69.4 kg)    Ocular: Sclerae unicteric, pupils equal, round and reactive to light Ear-nose-throat: Oropharynx clear, dentition fair Lymphatic: No cervical or supraclavicular adenopathy Lungs no rales or rhonchi, good excursion bilaterally Heart regular rate and rhythm, no murmur appreciated Abd soft, nontender, positive bowel sounds MSK no focal spinal tenderness, no joint edema Neuro: non-focal, well-oriented, appropriate affect Breasts: Deferred   Lab Results  Component Value Date   WBC 4.9 01/17/2022   HGB 13.3 01/17/2022   HCT 41.9 01/17/2022   MCV 90.9 01/17/2022   PLT 257 01/17/2022   Lab Results  Component Value Date   FERRITIN 16 11/22/2021   IRON 106 11/22/2021   TIBC 354 11/22/2021   UIBC 248 11/22/2021   IRONPCTSAT 30 11/22/2021   Lab Results  Component Value Date   RETICCTPCT 1.7 01/17/2022   RBC 4.61 01/17/2022   No results found for: "KPAFRELGTCHN", "LAMBDASER", "KAPLAMBRATIO" No results found for: "IGGSERUM", "IGA", "IGMSERUM" No results found for: "TOTALPROTELP", "ALBUMINELP", "A1GS", "A2GS", "BETS", "BETA2SER", "GAMS", "MSPIKE", "SPEI"   Chemistry      Component Value Date/Time   NA 141 11/22/2021 1042   K 3.9 11/22/2021 1042   CL 107 11/22/2021 1042   CO2 26 11/22/2021 1042   BUN 14 11/22/2021 1042   CREATININE 1.08 (H) 11/22/2021 1042   GLU 324 (H) 03/02/2009 1548      Component Value Date/Time   CALCIUM 9.1 11/22/2021 1042   ALKPHOS 49 11/22/2021 1042   AST 20 11/22/2021 1042   ALT 20 11/22/2021 1042   BILITOT 0.3 11/22/2021 1042       Impression and Plan: [name] is a very pleasant 81 yo caucasian female with history of iron deficiency anemia.  Iron studies are pending. We will replace again if needed.  Follow-up in 3 months.   Jessica Dawson, NP 9/25/202311:14 AM

## 2022-01-19 DIAGNOSIS — H25811 Combined forms of age-related cataract, right eye: Secondary | ICD-10-CM | POA: Diagnosis not present

## 2022-01-19 DIAGNOSIS — H40023 Open angle with borderline findings, high risk, bilateral: Secondary | ICD-10-CM | POA: Diagnosis not present

## 2022-01-26 DIAGNOSIS — Z23 Encounter for immunization: Secondary | ICD-10-CM | POA: Diagnosis not present

## 2022-02-11 DIAGNOSIS — H25811 Combined forms of age-related cataract, right eye: Secondary | ICD-10-CM | POA: Diagnosis not present

## 2022-02-11 DIAGNOSIS — H52221 Regular astigmatism, right eye: Secondary | ICD-10-CM | POA: Diagnosis not present

## 2022-02-11 DIAGNOSIS — H2511 Age-related nuclear cataract, right eye: Secondary | ICD-10-CM | POA: Diagnosis not present

## 2022-02-24 DIAGNOSIS — Z1231 Encounter for screening mammogram for malignant neoplasm of breast: Secondary | ICD-10-CM | POA: Diagnosis not present

## 2022-02-25 DIAGNOSIS — H2512 Age-related nuclear cataract, left eye: Secondary | ICD-10-CM | POA: Diagnosis not present

## 2022-02-25 DIAGNOSIS — H25812 Combined forms of age-related cataract, left eye: Secondary | ICD-10-CM | POA: Diagnosis not present

## 2022-03-16 ENCOUNTER — Other Ambulatory Visit (HOSPITAL_COMMUNITY): Payer: Self-pay | Admitting: *Deleted

## 2022-03-21 ENCOUNTER — Ambulatory Visit (HOSPITAL_COMMUNITY)
Admission: RE | Admit: 2022-03-21 | Discharge: 2022-03-21 | Disposition: A | Discharge status: Home / Self Care | Payer: Medicare Other | Source: Ambulatory Visit | Attending: Internal Medicine | Admitting: Internal Medicine

## 2022-03-21 DIAGNOSIS — M810 Age-related osteoporosis without current pathological fracture: Secondary | ICD-10-CM | POA: Diagnosis not present

## 2022-03-21 MED ORDER — DENOSUMAB 60 MG/ML ~~LOC~~ SOSY
PREFILLED_SYRINGE | SUBCUTANEOUS | Status: AC
  Administered 2022-03-21: 60 mg via SUBCUTANEOUS
  Filled 2022-03-21: qty 1

## 2022-03-21 MED ORDER — DENOSUMAB 60 MG/ML ~~LOC~~ SOSY: 60.0000 mg | PREFILLED_SYRINGE | Freq: Once | SUBCUTANEOUS | Status: AC

## 2022-04-21 DIAGNOSIS — D509 Iron deficiency anemia, unspecified: Secondary | ICD-10-CM | POA: Diagnosis not present

## 2022-04-21 DIAGNOSIS — M810 Age-related osteoporosis without current pathological fracture: Secondary | ICD-10-CM | POA: Diagnosis not present

## 2022-04-21 DIAGNOSIS — E7800 Pure hypercholesterolemia, unspecified: Secondary | ICD-10-CM | POA: Diagnosis not present

## 2022-04-21 DIAGNOSIS — E1129 Type 2 diabetes mellitus with other diabetic kidney complication: Secondary | ICD-10-CM | POA: Diagnosis not present

## 2022-04-21 DIAGNOSIS — R7989 Other specified abnormal findings of blood chemistry: Secondary | ICD-10-CM | POA: Diagnosis not present

## 2022-04-22 ENCOUNTER — Encounter: Payer: Self-pay | Admitting: Family

## 2022-04-22 ENCOUNTER — Inpatient Hospital Stay: Payer: Medicare Other | Attending: Hematology & Oncology

## 2022-04-22 ENCOUNTER — Inpatient Hospital Stay (HOSPITAL_BASED_OUTPATIENT_CLINIC_OR_DEPARTMENT_OTHER): Payer: Medicare Other | Admitting: Family

## 2022-04-22 VITALS — BP 136/74 | HR 95 | Temp 98.3°F | Resp 20 | Ht 65.0 in | Wt 144.0 lb

## 2022-04-22 DIAGNOSIS — D500 Iron deficiency anemia secondary to blood loss (chronic): Secondary | ICD-10-CM

## 2022-04-22 DIAGNOSIS — D509 Iron deficiency anemia, unspecified: Secondary | ICD-10-CM | POA: Diagnosis not present

## 2022-04-22 DIAGNOSIS — Z79899 Other long term (current) drug therapy: Secondary | ICD-10-CM | POA: Insufficient documentation

## 2022-04-22 LAB — CBC WITH DIFFERENTIAL (CANCER CENTER ONLY)
Abs Immature Granulocytes: 0.08 K/uL — ABNORMAL HIGH (ref 0.00–0.07)
Basophils Absolute: 0 K/uL (ref 0.0–0.1)
Basophils Relative: 1 %
Eosinophils Absolute: 0.3 K/uL (ref 0.0–0.5)
Eosinophils Relative: 4 %
HCT: 42.7 % (ref 36.0–46.0)
Hemoglobin: 13.4 g/dL (ref 12.0–15.0)
Immature Granulocytes: 1 %
Lymphocytes Relative: 24 %
Lymphs Abs: 1.6 K/uL (ref 0.7–4.0)
MCH: 29.6 pg (ref 26.0–34.0)
MCHC: 31.4 g/dL (ref 30.0–36.0)
MCV: 94.3 fL (ref 80.0–100.0)
Monocytes Absolute: 0.5 K/uL (ref 0.1–1.0)
Monocytes Relative: 7 %
Neutro Abs: 4.3 K/uL (ref 1.7–7.7)
Neutrophils Relative %: 63 %
Platelet Count: 321 K/uL (ref 150–400)
RBC: 4.53 MIL/uL (ref 3.87–5.11)
RDW: 14.1 % (ref 11.5–15.5)
WBC Count: 6.7 K/uL (ref 4.0–10.5)
nRBC: 0 % (ref 0.0–0.2)

## 2022-04-22 LAB — IRON AND IRON BINDING CAPACITY (CC-WL,HP ONLY)
Iron: 64 ug/dL (ref 28–170)
Saturation Ratios: 20 % (ref 10.4–31.8)
TIBC: 314 ug/dL (ref 250–450)
UIBC: 250 ug/dL (ref 148–442)

## 2022-04-22 LAB — RETICULOCYTES
Immature Retic Fract: 10.9 % (ref 2.3–15.9)
RBC.: 4.56 MIL/uL (ref 3.87–5.11)
Retic Count, Absolute: 94.4 10*3/uL (ref 19.0–186.0)
Retic Ct Pct: 2.1 % (ref 0.4–3.1)

## 2022-04-22 LAB — FERRITIN: Ferritin: 85 ng/mL (ref 11–307)

## 2022-04-22 NOTE — Progress Notes (Signed)
Hematology and Oncology Follow Up Visit  [name] [id number] [birth year] 81 y.o. 04/22/2022   Principle Diagnosis:  Iron deficiency anemia    Current Therapy:        IV iron as indicated    Interim History:  [name] is here today for follow-up. She is doing quite well and has no complaints at this time.  No c/o fatigue.  No blood loss. Bruising or petechiae.  No fever, chills, n/v, cough, rash, dizziness, SOB, chest pain, palpitations, abdominal pain or changes in bowel or bladder habits.  No swelling, tenderness, numbness or tingling in her extremities.  No falls or syncope.  Appetite and hydration are good. Weight is stable at 144 lbs.   ECOG Performance Status: 0 - Asymptomatic  Medications:  Allergies as of 04/22/2022       Reactions   Phenobarbital Other (See Comments)   Feeling of uneasiness        Medication List        Accurate as of April 22, 2022 10:11 AM. If you have any questions, ask your nurse or doctor.          STOP taking these medications    cyclobenzaprine 5 MG tablet Commonly known as: FLEXERIL Stopped by: Lottie Dawson, NP   denosumab 60 MG/ML Soln injection Commonly known as: PROLIA Stopped by: Lottie Dawson, NP       TAKE these medications    acetaminophen 325 MG tablet Commonly known as: TYLENOL Take 650 mg by mouth every 6 (six) hours as needed.   aspirin 325 MG tablet Take 325 mg by mouth daily.   atorvastatin 40 MG tablet Commonly known as: LIPITOR TK 1 T PO QHS   benazepril 40 MG tablet Commonly known as: LOTENSIN Take 40 mg by mouth daily.   calcium carbonate 600 MG Tabs tablet Commonly known as: OS-CAL Take 600 mg by mouth daily with breakfast.   cetirizine 10 MG tablet Commonly known as: ZYRTEC Take 10 mg by mouth daily as needed.   Folic Acid-Vit H2-CNO B09 2.5-25-1 MG Tabs tablet Commonly known as: FOLBEE Take 1 tablet by mouth daily.   gabapentin 100 MG capsule Commonly known as:  NEURONTIN Take 1 capsule (100 mg total) by mouth 3 (three) times daily as needed.   ibuprofen 200 MG tablet Commonly known as: ADVIL Take 200 mg by mouth every 6 (six) hours as needed.   LORazepam 0.5 MG tablet Commonly known as: ATIVAN Take 0.25 mg by mouth as needed for anxiety.   meclizine 25 MG tablet Commonly known as: ANTIVERT Take 25 mg by mouth 4 (four) times daily as needed for dizziness or nausea.   multivitamin tablet Take 1 tablet by mouth daily. Centrum silver   omeprazole 40 MG capsule Commonly known as: PRILOSEC Take 1 capsule (40 mg total) by mouth daily.   Polyethyl Glycol-Propyl Glycol 0.4-0.3 % Soln Place 1 drop into both eyes daily as needed (for dry eyes).   Vitamin D 125 MCG (5000 UT) Caps Take 5,000 Units by mouth daily.        Allergies:  Allergies  Allergen Reactions   Phenobarbital Other (See Comments)    Feeling of uneasiness    Past Medical History, Surgical history, Social history, and Family History were reviewed and updated.  Review of Systems: All other 10 point review of systems is negative.   Physical Exam:  height is '5\' 5"'$  (1.651 m) and weight is 144 lb (65.3 kg). Her oral temperature is 98.3  F (36.8 C). Her blood pressure is 136/74 and her pulse is 95. Her respiration is 20 and oxygen saturation is 98%.   Wt Readings from Last 3 Encounters:  04/22/22 144 lb (65.3 kg)  01/17/22 149 lb (67.6 kg)  12/01/21 153 lb (69.4 kg)    Ocular: Sclerae unicteric, pupils equal, round and reactive to light Ear-nose-throat: Oropharynx clear, dentition fair Lymphatic: No cervical or supraclavicular adenopathy Lungs no rales or rhonchi, good excursion bilaterally Heart regular rate and rhythm, no murmur appreciated Abd soft, nontender, positive bowel sounds MSK no focal spinal tenderness, no joint edema Neuro: non-focal, well-oriented, appropriate affect Breasts: Deferred   Lab Results  Component Value Date   WBC 6.7 04/22/2022    HGB 13.4 04/22/2022   HCT 42.7 04/22/2022   MCV 94.3 04/22/2022   PLT 321 04/22/2022   Lab Results  Component Value Date   FERRITIN 128 01/17/2022   IRON 60 01/17/2022   TIBC 269 01/17/2022   UIBC 209 01/17/2022   IRONPCTSAT 22 01/17/2022   Lab Results  Component Value Date   RETICCTPCT 2.1 04/22/2022   RBC 4.56 04/22/2022   RBC 4.53 04/22/2022   No results found for: "KPAFRELGTCHN", "LAMBDASER", "KAPLAMBRATIO" No results found for: "IGGSERUM", "IGA", "IGMSERUM" No results found for: "TOTALPROTELP", "ALBUMINELP", "A1GS", "A2GS", "BETS", "BETA2SER", "GAMS", "MSPIKE", "SPEI"   Chemistry      Component Value Date/Time   NA 141 11/22/2021 1042   K 3.9 11/22/2021 1042   CL 107 11/22/2021 1042   CO2 26 11/22/2021 1042   BUN 14 11/22/2021 1042   CREATININE 1.08 (H) 11/22/2021 1042   GLU 324 (H) 03/02/2009 1548      Component Value Date/Time   CALCIUM 9.1 11/22/2021 1042   ALKPHOS 49 11/22/2021 1042   AST 20 11/22/2021 1042   ALT 20 11/22/2021 1042   BILITOT 0.3 11/22/2021 1042       Impression and Plan: [name] is a very pleasant 81 yo caucasian female with history of iron deficiency anemia.  Iron studies are pending. We will replace again if needed.  Follow-up in 4 months.   Lottie Dawson, NP 12/29/202310:11 AM

## 2022-06-10 DIAGNOSIS — M810 Age-related osteoporosis without current pathological fracture: Secondary | ICD-10-CM | POA: Diagnosis not present

## 2022-06-10 DIAGNOSIS — Z0000 Encounter for general adult medical examination without abnormal findings: Secondary | ICD-10-CM | POA: Diagnosis not present

## 2022-06-10 DIAGNOSIS — Z1331 Encounter for screening for depression: Secondary | ICD-10-CM | POA: Diagnosis not present

## 2022-06-10 DIAGNOSIS — Z1339 Encounter for screening examination for other mental health and behavioral disorders: Secondary | ICD-10-CM | POA: Diagnosis not present

## 2022-06-10 DIAGNOSIS — R82998 Other abnormal findings in urine: Secondary | ICD-10-CM | POA: Diagnosis not present

## 2022-06-10 DIAGNOSIS — E1129 Type 2 diabetes mellitus with other diabetic kidney complication: Secondary | ICD-10-CM | POA: Diagnosis not present

## 2022-06-10 DIAGNOSIS — E119 Type 2 diabetes mellitus without complications: Secondary | ICD-10-CM | POA: Diagnosis not present

## 2022-06-10 DIAGNOSIS — I129 Hypertensive chronic kidney disease with stage 1 through stage 4 chronic kidney disease, or unspecified chronic kidney disease: Secondary | ICD-10-CM | POA: Diagnosis not present

## 2022-06-10 DIAGNOSIS — D509 Iron deficiency anemia, unspecified: Secondary | ICD-10-CM | POA: Diagnosis not present

## 2022-06-10 DIAGNOSIS — M5030 Other cervical disc degeneration, unspecified cervical region: Secondary | ICD-10-CM | POA: Diagnosis not present

## 2022-06-10 DIAGNOSIS — S329XXS Fracture of unspecified parts of lumbosacral spine and pelvis, sequela: Secondary | ICD-10-CM | POA: Diagnosis not present

## 2022-06-10 DIAGNOSIS — C541 Malignant neoplasm of endometrium: Secondary | ICD-10-CM | POA: Diagnosis not present

## 2022-06-10 DIAGNOSIS — N1831 Chronic kidney disease, stage 3a: Secondary | ICD-10-CM | POA: Diagnosis not present

## 2022-06-10 DIAGNOSIS — F418 Other specified anxiety disorders: Secondary | ICD-10-CM | POA: Diagnosis not present

## 2022-06-29 ENCOUNTER — Inpatient Hospital Stay: Payer: Medicare Other | Attending: Hematology & Oncology | Admitting: Obstetrics & Gynecology

## 2022-06-29 ENCOUNTER — Encounter: Payer: Self-pay | Admitting: Obstetrics & Gynecology

## 2022-06-29 ENCOUNTER — Inpatient Hospital Stay: Payer: Medicare Other

## 2022-06-29 VITALS — BP 158/57 | HR 90 | Temp 97.6°F | Resp 16 | Ht 65.0 in | Wt 142.0 lb

## 2022-06-29 DIAGNOSIS — N904 Leukoplakia of vulva: Secondary | ICD-10-CM | POA: Diagnosis not present

## 2022-06-29 DIAGNOSIS — Z923 Personal history of irradiation: Secondary | ICD-10-CM | POA: Diagnosis not present

## 2022-06-29 DIAGNOSIS — C549 Malignant neoplasm of corpus uteri, unspecified: Secondary | ICD-10-CM

## 2022-06-29 DIAGNOSIS — Z8542 Personal history of malignant neoplasm of other parts of uterus: Secondary | ICD-10-CM | POA: Insufficient documentation

## 2022-06-29 DIAGNOSIS — Z90722 Acquired absence of ovaries, bilateral: Secondary | ICD-10-CM | POA: Diagnosis not present

## 2022-06-29 DIAGNOSIS — Z90710 Acquired absence of both cervix and uterus: Secondary | ICD-10-CM | POA: Diagnosis not present

## 2022-06-29 DIAGNOSIS — Z9221 Personal history of antineoplastic chemotherapy: Secondary | ICD-10-CM | POA: Diagnosis not present

## 2022-06-29 DIAGNOSIS — Z87440 Personal history of urinary (tract) infections: Secondary | ICD-10-CM | POA: Insufficient documentation

## 2022-06-29 DIAGNOSIS — R3915 Urgency of urination: Secondary | ICD-10-CM | POA: Diagnosis not present

## 2022-06-29 LAB — URINALYSIS, COMPLETE (UACMP) WITH MICROSCOPIC
Bacteria, UA: NONE SEEN
Bilirubin Urine: NEGATIVE
Glucose, UA: NEGATIVE mg/dL
Ketones, ur: NEGATIVE mg/dL
Nitrite: NEGATIVE
Protein, ur: NEGATIVE mg/dL
Specific Gravity, Urine: 1.003 — ABNORMAL LOW (ref 1.005–1.030)
pH: 5 (ref 5.0–8.0)

## 2022-06-29 MED ORDER — CLOBETASOL PROPIONATE 0.05 % EX OINT: 1.0000 | TOPICAL_OINTMENT | CUTANEOUS | 3 refills | Status: AC

## 2022-06-29 NOTE — Patient Instructions (Addendum)
Return in 1 year   Vulvar/Vaginal Moisturizers  Moisturizer Options: Vitamin E oil: pump or capsule form Vitamin E cream (Gene's vitamin E cream) Coconut oil: bottle or bead form Shea butter Blossom Organic Lubricant (organic and all natural; www.blossomorganics.com) PE suppository(coconut oil/vitamin E/palm oil) [employer] Aloe Glide      Consider the ingredients of the product - the fewer the ingredients the better!  Directions for Use: Clean and dry your hands Gently dab the vulvar/vaginal area dry as needed Apply a "pea-sized" amount of the moisturizer onto your fingertip Using you other hand, open the labia   Apply the moisturizer to the vulvar/vaginal tissues Wear loose fitting underwear/clothing if possible following application  Use moisturize 2-3 times daily as desired.

## 2022-06-29 NOTE — Assessment & Plan Note (Signed)
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   Negative symptom review/exam NED   >Continue yearly follow-up

## 2022-06-29 NOTE — Progress Notes (Signed)
Follow Up Note: Gyn-Onc  [name] 82 y.o. female  CC: She presents for a f/u visit.  HPI:  Oncology History  Malignant neoplasm of corpus uteri, except isthmus ([place])  10/2008 Surgery    robotic-assisted laparoscopic hysterectomy, bilateral salpingo- oophorectomy, lymph node dissection   2010 -  Chemotherapy    She was enrolled in GOG protocol 258 and was randomized to receive 6 cycles of Taxol and carboplatin    2011 Relapse/Recurrence    Recurrent disease was identified at a laparoscopic port site, pelvic lymph nodes and in the vagina   05/16/2011 Initial Diagnosis   Malignant neoplasm of corpus uteri, except isthmus ([place])    - 2011 Radiation Therapy   adjuvant external beam and vaginal brachytherapy     - 09/2010 Chemotherapy    Taxol carboplatin therapy       Interval History:   She denies vaginal bleeding, abdominal/pelvic pain, cough, lethargy or abdominal distension. She reports chronic episodic gaseous distension w/diarrhea felt to be the sequelae of prior RT.  Evaluation by GI for IDA.  Stores repleted w/an iron infusion.  Also has urinary incontinence, wears a pad.  She has associated vulva irritative, itching sxs.   She had an episode of urgency earlier today.  She was treated for a UTI 1 mth ago.    Review of Systems  Review of Systems  Constitutional:  Negative for malaise/fatigue and weight loss.  Respiratory:  Negative for cough.   Gastrointestinal:  Negative for abdominal pain.  Genitourinary:        No bleeding   Current Meds:  Outpatient Encounter Medications as of 06/29/2022  Medication Sig   acetaminophen (TYLENOL) 325 MG tablet Take 650 mg by mouth every 6 (six) hours as needed.   aspirin 325 MG tablet Take 325 mg by mouth daily.   atorvastatin (LIPITOR) 40 MG tablet TK 1 T PO QHS   benazepril (LOTENSIN) 40 MG tablet Take 40 mg by mouth daily.   calcium carbonate  (OS-CAL) 600 MG TABS tablet Take 600 mg by mouth daily with breakfast.   cetirizine (ZYRTEC) 10 MG tablet Take 10 mg by mouth daily as needed.   Cholecalciferol (VITAMIN D) 125 MCG (5000 UT) CAPS Take 5,000 Units by mouth daily.   Folic Acid-Vit Q000111Q [id number] (FOLBEE) 2.5-25-1 MG TABS Take 1 tablet by mouth daily.   ibuprofen (ADVIL,MOTRIN) 200 MG tablet Take 200 mg by mouth every 6 (six) hours as needed.   LORazepam (ATIVAN) 0.5 MG tablet Take 0.25 mg by mouth as needed for anxiety.   meclizine (ANTIVERT) 25 MG tablet Take 25 mg by mouth 4 (four) times daily as needed for dizziness or nausea.   Multiple Vitamin (MULTIVITAMIN) tablet Take 1 tablet by mouth daily. Centrum silver   omeprazole (PRILOSEC) 40 MG capsule Take 1 capsule (40 mg total) by mouth daily.   Polyethyl Glycol-Propyl Glycol 0.4-0.3 % SOLN Place 1 drop into both eyes daily as needed (for dry eyes).   [DISCONTINUED] gabapentin (NEURONTIN) 100 MG capsule Take 1 capsule (100 mg total) by mouth 3 (three) times daily as needed. (Patient not taking: Reported on 04/22/2022)   No facility-administered encounter medications on file as of 06/29/2022.    Allergy:  Allergies  Allergen Reactions   Phenobarbital Other (See Comments)    Feeling of uneasiness    Social Hx:   Social History   Socioeconomic History   Marital status: Widowed    Spouse name: Not on file   Number of children:  1   Years of education: Not on file   Highest education level: Not on file  Occupational History   Not on file  Tobacco Use   Smoking status: Former    Packs/day: 1.00    Years: 10.00    Total pack years: 10.00    Types: Cigarettes    Quit date: 05/12/1976    Years since quitting: 46.1   Smokeless tobacco: Never  Vaping Use   Vaping Use: Never used  Substance and Sexual Activity   Alcohol use: No   Drug use: No   Sexual activity: Not Currently  Other Topics Concern   Not on file  Social History Narrative   Not on file   Social  Determinants of Health   Financial Resource Strain: Not on file  Food Insecurity: Not on file  Transportation Needs: Not on file  Physical Activity: Not on file  Stress: Not on file  Social Connections: Not on file  Intimate Partner Violence: Not on file    Past Surgical Hx:  Past Surgical History:  Procedure Laterality Date   ABDOMINAL HYSTERECTOMY     BREAST LUMPECTOMY  1993   Left breast, benign   ROBOTIC ASSISTED LAPAROSCOPIC VAGINAL HYSTERECTOMY WITH FIBROID REMOVAL  July 2010   bilat. salpingo-oophorectomy    Past Medical Hx:  Past Medical History:  Diagnosis Date   Allergy    Anxiety    takes ativan as needed   Arthritis    Benign positional vertigo    Bursitis of shoulder, left    Cancer (HCC)    Endometrial ca/Recurrence   CKD (chronic kidney disease), stage III (HCC)    Depression    Diabetes ([place])    History of colonic polyps    Hypercholesteremia    Hypercholesterolemia    Hypertension    Lumbar pain    Osteoarthritis    Osteoporosis    S/P radiation therapy July 29, 2010-Sep 06, 2010   External beam of pelvis   S/P radiation therapy 09/15/10, 09/24/10, 09/28/2010   Intracavitary brachytherapy   SBO (small bowel obstruction) (HCC)    Status post chemotherapy    carboplatin/paclitaxel x 6 rounds    Family Hx:  Family History  Problem Relation Age of Onset   Breast cancer Mother    Heart disease Father    Breast cancer Sister    Diabetes Sister    Colon cancer Neg Hx    Esophageal cancer Neg Hx    Rectal cancer Neg Hx    Stomach cancer Neg Hx     Vitals:  BP (!) 158/57 (BP Location: Left Arm, Patient Position: Sitting)   Pulse 90   Temp 97.6 F (36.4 C) (Oral)   Resp 16   Ht '5\' 5"'$  (1.651 m)   Wt 142 lb (64.4 kg)   SpO2 99%   BMI 23.63 kg/m   Physical Exam Abdominal:     General: There is no distension.     Palpations: Abdomen is soft.     Tenderness: There is no abdominal tenderness.  Genitourinary:    Adnexa:        Right: No  mass.         Left: No mass.       Rectum: Normal.     Comments: Vulva: white, waxy skin; petechiae, purpuric areas Vagina shortened w/synechiae at the apex Lymphadenopathy:     Upper Body:     Right upper body: No supraclavicular adenopathy.     Left upper  body: No supraclavicular adenopathy.     Lower Body: No right inguinal adenopathy. No left inguinal adenopathy.  Skin:    General: Skin is warm and dry.  Neurological:     Mental Status: She is alert and oriented to person, place, and time.    Assessment/Plan:  No problem-specific Assessment & Plan notes found for this encounter.  Malignant neoplasm of corpus uteri, except isthmus H/O Stage IIIA Gr 1 EC treated on GOG protocol 258.   Negative symptom review/exam NED  R/O acute UTI/cystitis  >Review the U/A urine C&S > Optimize hygiene practices; counseled re: moisturizers >Continue yearly follow-up      I personally spent 25 minutes face-to-face and non-face-to-face in the care of this patient, which includes all pre, intra, and post visit time on the date of service.   Lahoma Crocker, MD 06/29/2022, 2:21 PM

## 2022-06-30 LAB — URINE CULTURE: Culture: <10000 — AB

## 2022-08-08 ENCOUNTER — Encounter: Payer: Self-pay | Admitting: *Deleted

## 2022-08-22 ENCOUNTER — Inpatient Hospital Stay (HOSPITAL_BASED_OUTPATIENT_CLINIC_OR_DEPARTMENT_OTHER): Payer: Medicare Other | Admitting: Family

## 2022-08-22 ENCOUNTER — Inpatient Hospital Stay: Payer: Medicare Other | Attending: Family

## 2022-08-22 ENCOUNTER — Encounter: Payer: Self-pay | Admitting: Family

## 2022-08-22 VITALS — BP 144/79 | HR 88 | Temp 98.9°F | Resp 17 | Wt 141.0 lb

## 2022-08-22 DIAGNOSIS — Z8542 Personal history of malignant neoplasm of other parts of uterus: Secondary | ICD-10-CM | POA: Diagnosis not present

## 2022-08-22 DIAGNOSIS — D500 Iron deficiency anemia secondary to blood loss (chronic): Secondary | ICD-10-CM

## 2022-08-22 DIAGNOSIS — Z79899 Other long term (current) drug therapy: Secondary | ICD-10-CM | POA: Diagnosis not present

## 2022-08-22 DIAGNOSIS — D509 Iron deficiency anemia, unspecified: Secondary | ICD-10-CM | POA: Insufficient documentation

## 2022-08-22 LAB — IRON AND IRON BINDING CAPACITY (CC-WL,HP ONLY)
Iron: 68 ug/dL (ref 28–170)
Saturation Ratios: 20 % (ref 10.4–31.8)
TIBC: 347 ug/dL (ref 250–450)
UIBC: 279 ug/dL (ref 148–442)

## 2022-08-22 LAB — CBC WITH DIFFERENTIAL (CANCER CENTER ONLY)
Abs Immature Granulocytes: 0.04 10*3/uL (ref 0.00–0.07)
Basophils Absolute: 0 10*3/uL (ref 0.0–0.1)
Basophils Relative: 1 %
Eosinophils Absolute: 0.3 10*3/uL (ref 0.0–0.5)
Eosinophils Relative: 5 %
HCT: 39.8 % (ref 36.0–46.0)
Hemoglobin: 12.9 g/dL (ref 12.0–15.0)
Immature Granulocytes: 1 %
Lymphocytes Relative: 27 %
Lymphs Abs: 1.6 10*3/uL (ref 0.7–4.0)
MCH: 28.9 pg (ref 26.0–34.0)
MCHC: 32.4 g/dL (ref 30.0–36.0)
MCV: 89.2 fL (ref 80.0–100.0)
Monocytes Absolute: 0.5 10*3/uL (ref 0.1–1.0)
Monocytes Relative: 9 %
Neutro Abs: 3.4 10*3/uL (ref 1.7–7.7)
Neutrophils Relative %: 57 %
Platelet Count: 298 10*3/uL (ref 150–400)
RBC: 4.46 MIL/uL (ref 3.87–5.11)
RDW: 14.6 % (ref 11.5–15.5)
WBC Count: 5.8 10*3/uL (ref 4.0–10.5)
nRBC: 0 % (ref 0.0–0.2)

## 2022-08-22 LAB — RETICULOCYTES
Immature Retic Fract: 12.7 % (ref 2.3–15.9)
RBC.: 4.46 MIL/uL (ref 3.87–5.11)
Retic Count, Absolute: 67.3 10*3/uL (ref 19.0–186.0)
Retic Ct Pct: 1.5 % (ref 0.4–3.1)

## 2022-08-22 LAB — FERRITIN: Ferritin: 22 ng/mL (ref 11–307)

## 2022-08-22 NOTE — Progress Notes (Signed)
Hematology and Oncology Follow Up Visit  [name] [id number] 07/19/1940 82 y.o. 08/22/2022   Principle Diagnosis:  Iron deficiency anemia    Current Therapy:        IV iron as indicated    Interim History:  [name] is here today for follow-up. She is doing well and has no complaints at this time.  She has not noted any blood loss. No bruising or petechiae.  Appetite and hydration are good. She has increased her meat intake. Weight is stable at 141 lbs.  No fever, chills, n/v, cough, rash, dizziness, SOB, chest pain, palpitations, abdominal pain or changes in bowel or bladder habits.  No swelling, tenderness, numbness or tingling in her extremities at this time.  No falls or syncope.   ECOG Performance Status: 0 - Asymptomatic  Medications:  Allergies as of 08/22/2022       Reactions   Phenobarbital Other (See Comments)   Feeling of uneasiness        Medication List        Accurate as of August 22, 2022  8:51 AM. If you have any questions, ask your nurse or doctor.          acetaminophen 325 MG tablet Commonly known as: TYLENOL Take 650 mg by mouth every 6 (six) hours as needed.   aspirin 325 MG tablet Take 325 mg by mouth daily.   atorvastatin 40 MG tablet Commonly known as: LIPITOR TK 1 T PO QHS   benazepril 40 MG tablet Commonly known as: LOTENSIN Take 40 mg by mouth daily.   calcium carbonate 600 MG Tabs tablet Commonly known as: OS-CAL Take 600 mg by mouth daily with breakfast.   cetirizine 10 MG tablet Commonly known as: ZYRTEC Take 10 mg by mouth daily as needed.   clobetasol ointment 0.05 % Commonly known as: TEMOVATE Apply 1 Application topically 2 (two) times a week.   Folic Acid-Vit B6-Vit B12 2.5-25-1 MG Tabs tablet Commonly known as: FOLBEE Take 1 tablet by mouth daily.   ibuprofen 200 MG tablet Commonly known as: ADVIL Take 200 mg by mouth every 6 (six) hours as needed.   LORazepam 0.5 MG tablet Commonly known as:  ATIVAN Take 0.25 mg by mouth as needed for anxiety.   meclizine 25 MG tablet Commonly known as: ANTIVERT Take 25 mg by mouth 4 (four) times daily as needed for dizziness or nausea.   multivitamin tablet Take 1 tablet by mouth daily. Centrum silver   omeprazole 40 MG capsule Commonly known as: PRILOSEC Take 1 capsule (40 mg total) by mouth daily.   Polyethyl Glycol-Propyl Glycol 0.4-0.3 % Soln Place 1 drop into both eyes daily as needed (for dry eyes).   Vitamin D 125 MCG (5000 UT) Caps Take 5,000 Units by mouth daily.        Allergies:  Allergies  Allergen Reactions   Phenobarbital Other (See Comments)    Feeling of uneasiness    Past Medical History, Surgical history, Social history, and Family History were reviewed and updated.  Review of Systems: All other 10 point review of systems is negative.   Physical Exam:  weight is 141 lb (64 kg). Her oral temperature is 98.9 F (37.2 C). Her blood pressure is 161/70 (abnormal) and her pulse is 88. Her respiration is 17 and oxygen saturation is 98%.   Wt Readings from Last 3 Encounters:  08/22/22 141 lb (64 kg)  06/29/22 142 lb (64.4 kg)  04/22/22 144 lb (65.3 kg)  Ocular: Sclerae unicteric, pupils equal, round and reactive to light Ear-nose-throat: Oropharynx clear, dentition fair Lymphatic: No cervical or supraclavicular adenopathy Lungs no rales or rhonchi, good excursion bilaterally Heart regular rate and rhythm, no murmur appreciated Abd soft, nontender, positive bowel sounds MSK no focal spinal tenderness, no joint edema Neuro: non-focal, well-oriented, appropriate affect Breasts: Deferred   Lab Results  Component Value Date   WBC 5.8 08/22/2022   HGB 12.9 08/22/2022   HCT 39.8 08/22/2022   MCV 89.2 08/22/2022   PLT 298 08/22/2022   Lab Results  Component Value Date   FERRITIN 85 04/22/2022   IRON 64 04/22/2022   TIBC 314 04/22/2022   UIBC 250 04/22/2022   IRONPCTSAT 20 04/22/2022   Lab  Results  Component Value Date   RETICCTPCT 1.5 08/22/2022   RBC 4.46 08/22/2022   No results found for: "KPAFRELGTCHN", "LAMBDASER", "KAPLAMBRATIO" No results found for: "IGGSERUM", "IGA", "IGMSERUM" No results found for: "TOTALPROTELP", "ALBUMINELP", "A1GS", "A2GS", "BETS", "BETA2SER", "GAMS", "MSPIKE", "SPEI"   Chemistry      Component Value Date/Time   NA 141 11/22/2021 1042   K 3.9 11/22/2021 1042   CL 107 11/22/2021 1042   CO2 26 11/22/2021 1042   BUN 14 11/22/2021 1042   CREATININE 1.08 (H) 11/22/2021 1042   GLU 324 (H) 03/02/2009 1548      Component Value Date/Time   CALCIUM 9.1 11/22/2021 1042   ALKPHOS 49 11/22/2021 1042   AST 20 11/22/2021 1042   ALT 20 11/22/2021 1042   BILITOT 0.3 11/22/2021 1042       Impression and Plan: [name] is a very pleasant 82 yo caucasian female with history of iron deficiency anemia.  Iron studies are pending. We will replace again if needed.  Follow-up in 6 months.   Eileen Stanford, NP 4/29/20248:51 AM

## 2022-09-07 DIAGNOSIS — J302 Other seasonal allergic rhinitis: Secondary | ICD-10-CM | POA: Diagnosis not present

## 2022-09-07 DIAGNOSIS — K219 Gastro-esophageal reflux disease without esophagitis: Secondary | ICD-10-CM | POA: Diagnosis not present

## 2022-09-07 DIAGNOSIS — M1990 Unspecified osteoarthritis, unspecified site: Secondary | ICD-10-CM | POA: Diagnosis not present

## 2022-09-07 DIAGNOSIS — I129 Hypertensive chronic kidney disease with stage 1 through stage 4 chronic kidney disease, or unspecified chronic kidney disease: Secondary | ICD-10-CM | POA: Diagnosis not present

## 2022-09-07 DIAGNOSIS — K5520 Angiodysplasia of colon without hemorrhage: Secondary | ICD-10-CM | POA: Diagnosis not present

## 2022-09-07 DIAGNOSIS — S329XXS Fracture of unspecified parts of lumbosacral spine and pelvis, sequela: Secondary | ICD-10-CM | POA: Diagnosis not present

## 2022-09-07 DIAGNOSIS — F418 Other specified anxiety disorders: Secondary | ICD-10-CM | POA: Diagnosis not present

## 2022-09-07 DIAGNOSIS — E1129 Type 2 diabetes mellitus with other diabetic kidney complication: Secondary | ICD-10-CM | POA: Diagnosis not present

## 2022-09-07 DIAGNOSIS — N1831 Chronic kidney disease, stage 3a: Secondary | ICD-10-CM | POA: Diagnosis not present

## 2022-09-07 DIAGNOSIS — D509 Iron deficiency anemia, unspecified: Secondary | ICD-10-CM | POA: Diagnosis not present

## 2022-09-07 DIAGNOSIS — C541 Malignant neoplasm of endometrium: Secondary | ICD-10-CM | POA: Diagnosis not present

## 2022-09-07 DIAGNOSIS — H9193 Unspecified hearing loss, bilateral: Secondary | ICD-10-CM | POA: Diagnosis not present

## 2022-09-08 LAB — LAB REPORT - SCANNED
A1c: 7.7
Albumin, Urine POC: 6.1
Albumin/Creatinine Ratio, Urine, POC: 8
Creatinine, Urine.: 79.9
EGFR: 53.1

## 2022-09-09 ENCOUNTER — Telehealth: Payer: Self-pay | Admitting: *Deleted

## 2022-09-09 NOTE — Telephone Encounter (Signed)
Call received from patient stating that her PCP instructed her to take Ferrous Sulfate 325 mg PO daily and would like to know if she needs an IV iron infusion at this time.  Pt instructed to have her PCP fax lab results to [phone] for Jessica Skeen NP to evaluate her iron results.  Lab results received via fax and reviewed by Jessica Skeen NP.  Call placed back to patient and patient notified per order of S. Montez Morita NP that she does not need IV iron at this time and to continue Ferrous Sulfate 325 mg PO daily with food as per order of her PCP.  Teach back done.

## 2022-09-15 ENCOUNTER — Other Ambulatory Visit (HOSPITAL_COMMUNITY): Payer: Self-pay

## 2022-09-20 ENCOUNTER — Encounter: Payer: Self-pay | Admitting: Family

## 2022-09-20 ENCOUNTER — Ambulatory Visit (HOSPITAL_COMMUNITY)
Admission: RE | Admit: 2022-09-20 | Discharge: 2022-09-20 | Disposition: A | Discharge status: Home / Self Care | Payer: Medicare Other | Source: Ambulatory Visit | Attending: Internal Medicine | Admitting: Internal Medicine

## 2022-09-20 DIAGNOSIS — L720 Epidermal cyst: Secondary | ICD-10-CM | POA: Diagnosis not present

## 2022-09-20 DIAGNOSIS — L820 Inflamed seborrheic keratosis: Secondary | ICD-10-CM | POA: Diagnosis not present

## 2022-09-20 DIAGNOSIS — L814 Other melanin hyperpigmentation: Secondary | ICD-10-CM | POA: Diagnosis not present

## 2022-09-20 DIAGNOSIS — L2089 Other atopic dermatitis: Secondary | ICD-10-CM | POA: Diagnosis not present

## 2022-09-20 DIAGNOSIS — M810 Age-related osteoporosis without current pathological fracture: Secondary | ICD-10-CM | POA: Diagnosis not present

## 2022-09-20 DIAGNOSIS — D1801 Hemangioma of skin and subcutaneous tissue: Secondary | ICD-10-CM | POA: Diagnosis not present

## 2022-09-20 DIAGNOSIS — L821 Other seborrheic keratosis: Secondary | ICD-10-CM | POA: Diagnosis not present

## 2022-09-20 DIAGNOSIS — D2371 Other benign neoplasm of skin of right lower limb, including hip: Secondary | ICD-10-CM | POA: Diagnosis not present

## 2022-09-20 DIAGNOSIS — L578 Other skin changes due to chronic exposure to nonionizing radiation: Secondary | ICD-10-CM | POA: Diagnosis not present

## 2022-09-20 MED ORDER — DENOSUMAB 60 MG/ML ~~LOC~~ SOSY
60.0000 mg | PREFILLED_SYRINGE | Freq: Once | SUBCUTANEOUS | Status: AC
  Administered 2022-09-20: 60 mg via SUBCUTANEOUS

## 2022-09-20 MED ORDER — DENOSUMAB 60 MG/ML ~~LOC~~ SOSY
PREFILLED_SYRINGE | SUBCUTANEOUS | Status: AC
  Filled 2022-09-20: qty 1

## 2022-09-24 ENCOUNTER — Inpatient Hospital Stay (HOSPITAL_BASED_OUTPATIENT_CLINIC_OR_DEPARTMENT_OTHER)
Admission: EM | Admit: 2022-09-24 | Discharge: 2022-09-27 | DRG: 390 | Disposition: A | Discharge status: Home / Self Care | Payer: Medicare Other | Attending: Internal Medicine | Admitting: Internal Medicine

## 2022-09-24 ENCOUNTER — Emergency Department (HOSPITAL_BASED_OUTPATIENT_CLINIC_OR_DEPARTMENT_OTHER): Payer: Medicare Other

## 2022-09-24 ENCOUNTER — Encounter (HOSPITAL_BASED_OUTPATIENT_CLINIC_OR_DEPARTMENT_OTHER): Payer: Self-pay | Admitting: Emergency Medicine

## 2022-09-24 DIAGNOSIS — Z79899 Other long term (current) drug therapy: Secondary | ICD-10-CM | POA: Diagnosis not present

## 2022-09-24 DIAGNOSIS — R1084 Generalized abdominal pain: Secondary | ICD-10-CM | POA: Diagnosis not present

## 2022-09-24 DIAGNOSIS — E1122 Type 2 diabetes mellitus with diabetic chronic kidney disease: Secondary | ICD-10-CM | POA: Diagnosis not present

## 2022-09-24 DIAGNOSIS — I129 Hypertensive chronic kidney disease with stage 1 through stage 4 chronic kidney disease, or unspecified chronic kidney disease: Secondary | ICD-10-CM | POA: Diagnosis not present

## 2022-09-24 DIAGNOSIS — E119 Type 2 diabetes mellitus without complications: Secondary | ICD-10-CM

## 2022-09-24 DIAGNOSIS — E860 Dehydration: Secondary | ICD-10-CM | POA: Diagnosis present

## 2022-09-24 DIAGNOSIS — F32A Depression, unspecified: Secondary | ICD-10-CM | POA: Diagnosis present

## 2022-09-24 DIAGNOSIS — Z833 Family history of diabetes mellitus: Secondary | ICD-10-CM | POA: Diagnosis not present

## 2022-09-24 DIAGNOSIS — K529 Noninfective gastroenteritis and colitis, unspecified: Secondary | ICD-10-CM | POA: Diagnosis present

## 2022-09-24 DIAGNOSIS — K449 Diaphragmatic hernia without obstruction or gangrene: Secondary | ICD-10-CM | POA: Diagnosis present

## 2022-09-24 DIAGNOSIS — Z87891 Personal history of nicotine dependence: Secondary | ICD-10-CM | POA: Diagnosis not present

## 2022-09-24 DIAGNOSIS — I10 Essential (primary) hypertension: Secondary | ICD-10-CM | POA: Diagnosis not present

## 2022-09-24 DIAGNOSIS — Z8542 Personal history of malignant neoplasm of other parts of uterus: Secondary | ICD-10-CM

## 2022-09-24 DIAGNOSIS — K5669 Other partial intestinal obstruction: Secondary | ICD-10-CM | POA: Diagnosis not present

## 2022-09-24 DIAGNOSIS — F411 Generalized anxiety disorder: Secondary | ICD-10-CM | POA: Diagnosis present

## 2022-09-24 DIAGNOSIS — Z923 Personal history of irradiation: Secondary | ICD-10-CM | POA: Diagnosis not present

## 2022-09-24 DIAGNOSIS — Z8249 Family history of ischemic heart disease and other diseases of the circulatory system: Secondary | ICD-10-CM | POA: Diagnosis not present

## 2022-09-24 DIAGNOSIS — K5651 Intestinal adhesions [bands], with partial obstruction: Secondary | ICD-10-CM | POA: Diagnosis not present

## 2022-09-24 DIAGNOSIS — Z8719 Personal history of other diseases of the digestive system: Secondary | ICD-10-CM | POA: Diagnosis not present

## 2022-09-24 DIAGNOSIS — E785 Hyperlipidemia, unspecified: Secondary | ICD-10-CM | POA: Diagnosis present

## 2022-09-24 DIAGNOSIS — Z7982 Long term (current) use of aspirin: Secondary | ICD-10-CM

## 2022-09-24 DIAGNOSIS — K56609 Unspecified intestinal obstruction, unspecified as to partial versus complete obstruction: Secondary | ICD-10-CM | POA: Diagnosis not present

## 2022-09-24 DIAGNOSIS — Z803 Family history of malignant neoplasm of breast: Secondary | ICD-10-CM

## 2022-09-24 DIAGNOSIS — E7800 Pure hypercholesterolemia, unspecified: Secondary | ICD-10-CM | POA: Diagnosis present

## 2022-09-24 DIAGNOSIS — R109 Unspecified abdominal pain: Secondary | ICD-10-CM | POA: Diagnosis present

## 2022-09-24 DIAGNOSIS — N19 Unspecified kidney failure: Secondary | ICD-10-CM | POA: Diagnosis present

## 2022-09-24 DIAGNOSIS — Z0389 Encounter for observation for other suspected diseases and conditions ruled out: Secondary | ICD-10-CM | POA: Diagnosis not present

## 2022-09-24 DIAGNOSIS — I700 Atherosclerosis of aorta: Secondary | ICD-10-CM | POA: Diagnosis not present

## 2022-09-24 DIAGNOSIS — D72829 Elevated white blood cell count, unspecified: Secondary | ICD-10-CM | POA: Diagnosis present

## 2022-09-24 DIAGNOSIS — R1110 Vomiting, unspecified: Secondary | ICD-10-CM | POA: Diagnosis not present

## 2022-09-24 DIAGNOSIS — N183 Chronic kidney disease, stage 3 unspecified: Secondary | ICD-10-CM | POA: Diagnosis present

## 2022-09-24 DIAGNOSIS — E782 Mixed hyperlipidemia: Secondary | ICD-10-CM | POA: Diagnosis not present

## 2022-09-24 DIAGNOSIS — K219 Gastro-esophageal reflux disease without esophagitis: Secondary | ICD-10-CM | POA: Diagnosis not present

## 2022-09-24 DIAGNOSIS — Z9221 Personal history of antineoplastic chemotherapy: Secondary | ICD-10-CM

## 2022-09-24 LAB — CBC WITH DIFFERENTIAL/PLATELET
Abs Immature Granulocytes: 0.03 10*3/uL (ref 0.00–0.07)
Basophils Absolute: 0 10*3/uL (ref 0.0–0.1)
Basophils Relative: 0 %
Eosinophils Absolute: 0.1 10*3/uL (ref 0.0–0.5)
Eosinophils Relative: 1 %
HCT: 40.5 % (ref 36.0–46.0)
Hemoglobin: 13.3 g/dL (ref 12.0–15.0)
Immature Granulocytes: 0 %
Lymphocytes Relative: 14 %
Lymphs Abs: 1.5 10*3/uL (ref 0.7–4.0)
MCH: 29.4 pg (ref 26.0–34.0)
MCHC: 32.8 g/dL (ref 30.0–36.0)
MCV: 89.4 fL (ref 80.0–100.0)
Monocytes Absolute: 0.6 10*3/uL (ref 0.1–1.0)
Monocytes Relative: 5 %
Neutro Abs: 9.1 10*3/uL — ABNORMAL HIGH (ref 1.7–7.7)
Neutrophils Relative %: 80 %
Platelets: 356 10*3/uL (ref 150–400)
RBC: 4.53 MIL/uL (ref 3.87–5.11)
RDW: 14.7 % (ref 11.5–15.5)
WBC: 11.4 10*3/uL — ABNORMAL HIGH (ref 4.0–10.5)
nRBC: 0 % (ref 0.0–0.2)

## 2022-09-24 LAB — LIPASE, BLOOD: Lipase: 40 U/L (ref 11–51)

## 2022-09-24 LAB — COMPREHENSIVE METABOLIC PANEL
ALT: 24 U/L (ref 0–44)
AST: 37 U/L (ref 15–41)
Albumin: 3.7 g/dL (ref 3.5–5.0)
Alkaline Phosphatase: 53 U/L (ref 38–126)
Anion gap: 8 (ref 5–15)
BUN: 23 mg/dL (ref 8–23)
CO2: 26 mmol/L (ref 22–32)
Calcium: 9.5 mg/dL (ref 8.9–10.3)
Chloride: 105 mmol/L (ref 98–111)
Creatinine, Ser: 0.89 mg/dL (ref 0.44–1.00)
GFR, Estimated: >60 mL/min (ref >60)
Glucose, Bld: 160 mg/dL — ABNORMAL HIGH (ref 70–99)
Potassium: 5.1 mmol/L (ref 3.5–5.1)
Sodium: 139 mmol/L (ref 135–145)
Total Bilirubin: 0.8 mg/dL (ref 0.3–1.2)
Total Protein: 7.1 g/dL (ref 6.5–8.1)

## 2022-09-24 MED ORDER — IOHEXOL 300 MG/ML  SOLN
100.0000 mL | Freq: Once | INTRAMUSCULAR | Status: AC | PRN
  Administered 2022-09-24: 100 mL via INTRAVENOUS

## 2022-09-24 MED ORDER — ONDANSETRON HCL 4 MG/2ML IJ SOLN
4.0000 mg | Freq: Four times a day (QID) | INTRAMUSCULAR | Status: DC | PRN
  Filled 2022-09-24: qty 2

## 2022-09-24 MED ORDER — LACTATED RINGERS IV SOLN: INTRAVENOUS | Status: DC

## 2022-09-24 MED ORDER — LACTATED RINGERS IV BOLUS
1000.0000 mL | Freq: Once | INTRAVENOUS | Status: AC
  Administered 2022-09-24: 1000 mL via INTRAVENOUS

## 2022-09-24 MED ORDER — ONDANSETRON HCL 4 MG/2ML IJ SOLN
4.0000 mg | Freq: Once | INTRAMUSCULAR | Status: AC
  Administered 2022-09-24: 4 mg via INTRAVENOUS
  Filled 2022-09-24: qty 2

## 2022-09-24 MED ORDER — INSULIN ASPART 100 UNIT/ML IJ SOLN: 0.0000 [IU] | Freq: Three times a day (TID) | INTRAMUSCULAR | Status: DC

## 2022-09-24 MED ORDER — MORPHINE SULFATE (PF) 2 MG/ML IV SOLN: 1.0000 mg | INTRAVENOUS | Status: DC | PRN

## 2022-09-24 NOTE — ED Notes (Signed)
[facility] called for transport @21 :57

## 2022-09-24 NOTE — ED Provider Notes (Signed)
[facility] Provider Note   CSN: [id number] Arrival date & time: 09/24/22  1837     History Chief Complaint  Patient presents with   Abdominal Pain    HPI [name] is a 82 y.o. female presenting for abdominal distention and abdominal pain.  82 year old female with with an extensive medical history including diabetes, CKD, HTN, history of SBO thought to be secondary to intra-abdominal radiation for endometrial cancer. She is having refractory vomiting on arrival, diffuse abdominal pain.  No medications used prior to arrival.  History of small bowel obstruction with similar patient.  Patient's recorded medical, surgical, social, medication list and allergies were reviewed in the Snapshot window as part of the initial history.   Review of Systems   Review of Systems  Constitutional:  Negative for chills and fever.  HENT:  Negative for ear pain and sore throat.   Eyes:  Negative for pain and visual disturbance.  Respiratory:  Negative for cough and shortness of breath.   Cardiovascular:  Negative for chest pain and palpitations.  Gastrointestinal:  Positive for abdominal distention, abdominal pain, nausea and vomiting.  Genitourinary:  Negative for dysuria and hematuria.  Musculoskeletal:  Negative for arthralgias and back pain.  Skin:  Negative for color change and rash.  Neurological:  Negative for seizures and syncope.  All other systems reviewed and are negative.   Physical Exam Updated Vital Signs BP (!) 157/91 (BP Location: Right Arm)   Pulse (!) 104   Temp 98.4 F (36.9 C) (Oral)   Resp 16   Ht 5\' 4"  (1.626 m)   Wt 64.4 kg   SpO2 98%   BMI 24.37 kg/m  Physical Exam Vitals and nursing note reviewed.  Constitutional:      General: She is not in acute distress.    Appearance: She is well-developed. She is ill-appearing.  HENT:     Head: Normocephalic and atraumatic.  Eyes:     Conjunctiva/sclera: Conjunctivae  normal.  Cardiovascular:     Rate and Rhythm: Normal rate and regular rhythm.     Heart sounds: No murmur heard. Pulmonary:     Effort: Pulmonary effort is normal. No respiratory distress.     Breath sounds: Normal breath sounds.  Abdominal:     General: There is no distension.     Palpations: Abdomen is soft.     Tenderness: There is abdominal tenderness in the epigastric area and periumbilical area. There is no right CVA tenderness or left CVA tenderness.  Musculoskeletal:        General: No swelling or tenderness. Normal range of motion.     Cervical back: Neck supple.  Skin:    General: Skin is warm and dry.  Neurological:     General: No focal deficit present.     Mental Status: She is alert and oriented to person, place, and time. Mental status is at baseline.     Cranial Nerves: No cranial nerve deficit.      ED Course/ Medical Decision Making/ A&P    Procedures Procedures   Medications Ordered in ED Medications  ondansetron (ZOFRAN) injection 4 mg (4 mg Intravenous Given 09/24/22 1911)  lactated ringers bolus 1,000 mL (1,000 mLs Intravenous New Bag/Given 09/24/22 1913)  iohexol (OMNIPAQUE) 300 MG/ML solution 100 mL (100 mLs Intravenous Contrast Given 09/24/22 1950)   Medical Decision Making:   [name] is a 82 y.o. female who presented to the ED today with abdominal pain, detailed  above.    Patient's presentation is complicated by their history of multiple medical problems.  Patient placed on continuous vitals and telemetry monitoring while in ED which was reviewed periodically.  Complete initial physical exam performed, notably the patient  was actively vomiting, .     Reviewed and confirmed nursing documentation for past medical history, family history, social history.    Initial Assessment:   With the patient's presentation of abdominal pain, most likely diagnosis is nonspecific etiology vs recurrent obstruction. Other diagnoses were considered including (but  not limited to) gastroenteritis, colitis, appendicitis, cholecystitis, pancreatitis, nephrolithiasis, UTI, pyleonephritis. These are considered less likely due to history of present illness and physical exam findings.   This is most consistent with an acute life/limb threatening illness complicated by underlying chronic conditions.   Initial Plan:  CBC/CMP to evaluate for underlying infectious/metabolic etiology for patient's abdominal pain  Lipase to evaluate for pancreatitis  CTAB/Pelvis with contrast to evaluate for structural/surgical etiology of patients' severe abdominal pain.  Urinalysis and repeat physical assessment to evaluate for UTI/Pyelonpehritis  Empiric management of symptoms with escalating pain control and antiemetics as needed.   Initial Study Results:   Laboratory  All laboratory results reviewed without evidence of clinically relevant pathology.     Radiology All images reviewed independently. Agree with radiology report at this time.   CT ABDOMEN PELVIS W CONTRAST  Result Date: 09/24/2022 CLINICAL DATA:  Acute nonlocalized abdominal pain. Nausea, vomiting, diarrhea. EXAM: CT ABDOMEN AND PELVIS WITH CONTRAST TECHNIQUE: Multidetector CT imaging of the abdomen and pelvis was performed using the standard protocol following bolus administration of intravenous contrast. RADIATION DOSE REDUCTION: This exam was performed according to the departmental dose-optimization program which includes automated exposure control, adjustment of the mA and/or kV according to patient size and/or use of iterative reconstruction technique. CONTRAST:  OMNIPAQUE IOHEXOL 300 MG/ML  SOLN COMPARISON:  CT colonography 06/04/2015 FINDINGS: Lower chest: Pectus excavatum deformity.  Lung bases are clear. Hepatobiliary: No focal liver lesions. The lateral segment of the left lobe of the liver is atrophic, unchanged since prior study and likely congenital. Gallbladder and bile ducts are normal. Pancreas:  Unremarkable. No pancreatic ductal dilatation or surrounding inflammatory changes. Spleen: Normal in size without focal abnormality. Adrenals/Urinary Tract: Adrenal glands are unremarkable. Kidneys are normal, without renal calculi, focal lesion, or hydronephrosis. Bladder is unremarkable. Stomach/Bowel: Stomach is mostly decompressed. Small esophageal hiatal hernia. Distal small bowel loops are fluid-filled without significant distention and with mild wall thickening. There is mesenteric stranding. Changes most likely to represent enteritis. Early obstruction is not excluded. Scattered stool in the colon. No colonic distention or wall thickening. Appendix is normal. Vascular/Lymphatic: Aortic atherosclerosis. No enlarged abdominal or pelvic lymph nodes. Reproductive: No pelvic mass. Other: Small amount of free fluid in the abdomen particularly around the liver. This may be ascites or reactive fluid. No free air. Abdominal wall musculature appears intact. Musculoskeletal: Degenerative changes in the spine. Lumbar scoliosis convex towards the right. IMPRESSION: 1. Fluid-filled small bowel with wall thickening and mesenteric fluid collection likely indicating enteritis. Early obstruction would also be a possibility. 2. Small amount of abdominal ascites. 3. Small esophageal hiatal hernia. 4. Aortic atherosclerosis. Electronically Signed   By: Burman Nieves M.D.   On: 09/24/2022 20:08     Consults: Case discussed with general surgery Dr.Cornett.  They agree with medical admission which was arranged.  Disposition:   Based on the above findings, I believe this patient is stable for admission.  Patient/family educated about specific findings on our evaluation and explained exact reasons for admission.  Patient/family educated about clinical situation and time was allowed to answer questions.   Admission team communicated with and agreed with need for admission. Patient admitted. Patient  ready to move at this  time.     Emergency Department Medication Summary:   Medications  ondansetron [facility]) injection 4 mg (4 mg Intravenous Given 09/24/22 1911)  lactated ringers bolus 1,000 mL (1,000 mLs Intravenous New Bag/Given 09/24/22 1913)  iohexol (OMNIPAQUE) 300 MG/ML solution 100 mL (100 mLs Intravenous Contrast Given 09/24/22 1950)             Clinical Impression:  1. SBO (small bowel obstruction) (HCC)      Data Unavailable   Final Clinical Impression(s) / ED Diagnoses Final diagnoses:  SBO (small bowel obstruction) (HCC)    Rx / DC Orders ED Discharge Orders     None         Glyn Ade, MD 09/24/22 2111

## 2022-09-24 NOTE — ED Triage Notes (Signed)
Pt c/o general/mid abd pain since yesterday; NVD; feels generally weak

## 2022-09-24 NOTE — ED Notes (Addendum)
Attempt to call report to 6N at [facility] was instructed that the RN was in a pt room and to call back in 10 min.   [facility] called and in route for transport, transfer signature received by pt, and med necessity complete

## 2022-09-24 NOTE — Progress Notes (Addendum)
This is a 82 year old female with past medical history of anxiety and depression, BPV, endometrial cancer with recurrence status post chemo and radiation therapy, CKD, diabetes mellitus type 2, HLD, HTN, and prior history of small bowel obstruction.  Patient has a history of robotic-assisted laparoscopic hysterectomy, bilateral salpingo- oophorectomy in July, 2010   Earlier today patient developed abdominal pain approximately noon.  By 2 PM she developed profuse nausea and vomiting.  No reports of blood in emesis.  She also had abdominal pain and cramps.    By 6 PM she went to [facility] ER. In the ER patient's vital signs stable 134/59, 87, 18, 98.4%, satting 96% on room air.  Labs essentially normal.  CT abdomen and pelvis done revealed IMPRESSION: 1. Fluid-filled small bowel with wall thickening and mesenteric fluid collection likely indicating enteritis. Early obstruction would also be a possibility. 2. Small amount of abdominal ascites. 3. Small esophageal hiatal hernia.  EDP contacted surgeon on-call Dr. Rosezena Sensor who agreed with admission for possible small bowel obstruction.  NG tube not placed as patient's nausea improved significantly after single dose of IV Zofran.  Patient accepted for transfer to [facility] or [facility], first bed available

## 2022-09-24 NOTE — ED Notes (Signed)
Report given to Gordy Councilman, RN at [facility].  [facility] at bedside to transport pt.  Pt stable and pain 0/10

## 2022-09-25 ENCOUNTER — Inpatient Hospital Stay (HOSPITAL_COMMUNITY): Payer: Medicare Other

## 2022-09-25 ENCOUNTER — Encounter (HOSPITAL_COMMUNITY): Payer: Self-pay | Admitting: Family Medicine

## 2022-09-25 DIAGNOSIS — K219 Gastro-esophageal reflux disease without esophagitis: Secondary | ICD-10-CM | POA: Diagnosis present

## 2022-09-25 DIAGNOSIS — N19 Unspecified kidney failure: Secondary | ICD-10-CM | POA: Diagnosis not present

## 2022-09-25 DIAGNOSIS — R1084 Generalized abdominal pain: Secondary | ICD-10-CM

## 2022-09-25 DIAGNOSIS — F411 Generalized anxiety disorder: Secondary | ICD-10-CM | POA: Diagnosis present

## 2022-09-25 DIAGNOSIS — E782 Mixed hyperlipidemia: Secondary | ICD-10-CM

## 2022-09-25 DIAGNOSIS — R109 Unspecified abdominal pain: Secondary | ICD-10-CM | POA: Diagnosis present

## 2022-09-25 DIAGNOSIS — K56609 Unspecified intestinal obstruction, unspecified as to partial versus complete obstruction: Secondary | ICD-10-CM

## 2022-09-25 DIAGNOSIS — E860 Dehydration: Secondary | ICD-10-CM | POA: Diagnosis not present

## 2022-09-25 DIAGNOSIS — E119 Type 2 diabetes mellitus without complications: Secondary | ICD-10-CM

## 2022-09-25 DIAGNOSIS — I10 Essential (primary) hypertension: Secondary | ICD-10-CM

## 2022-09-25 DIAGNOSIS — E785 Hyperlipidemia, unspecified: Secondary | ICD-10-CM | POA: Diagnosis present

## 2022-09-25 DIAGNOSIS — D72829 Elevated white blood cell count, unspecified: Secondary | ICD-10-CM

## 2022-09-25 LAB — CBC WITH DIFFERENTIAL/PLATELET
Abs Immature Granulocytes: 0.04 10*3/uL (ref 0.00–0.07)
Basophils Absolute: 0 10*3/uL (ref 0.0–0.1)
Basophils Relative: 0 %
Eosinophils Absolute: 0.1 10*3/uL (ref 0.0–0.5)
Eosinophils Relative: 1 %
HCT: 37.4 % (ref 36.0–46.0)
Hemoglobin: 12 g/dL (ref 12.0–15.0)
Immature Granulocytes: 0 %
Lymphocytes Relative: 10 %
Lymphs Abs: 1.2 10*3/uL (ref 0.7–4.0)
MCH: 29 pg (ref 26.0–34.0)
MCHC: 32.1 g/dL (ref 30.0–36.0)
MCV: 90.3 fL (ref 80.0–100.0)
Monocytes Absolute: 0.7 10*3/uL (ref 0.1–1.0)
Monocytes Relative: 6 %
Neutro Abs: 9.5 10*3/uL — ABNORMAL HIGH (ref 1.7–7.7)
Neutrophils Relative %: 83 %
Platelets: 327 10*3/uL (ref 150–400)
RBC: 4.14 MIL/uL (ref 3.87–5.11)
RDW: 14.7 % (ref 11.5–15.5)
WBC: 11.5 10*3/uL — ABNORMAL HIGH (ref 4.0–10.5)
nRBC: 0 % (ref 0.0–0.2)

## 2022-09-25 LAB — GLUCOSE, CAPILLARY
Glucose-Capillary: 109 mg/dL — ABNORMAL HIGH (ref 70–99)
Glucose-Capillary: 120 mg/dL — ABNORMAL HIGH (ref 70–99)
Glucose-Capillary: 123 mg/dL — ABNORMAL HIGH (ref 70–99)
Glucose-Capillary: 149 mg/dL — ABNORMAL HIGH (ref 70–99)
Glucose-Capillary: 99 mg/dL (ref 70–99)

## 2022-09-25 LAB — COMPREHENSIVE METABOLIC PANEL
ALT: 21 U/L (ref 0–44)
AST: 18 U/L (ref 15–41)
Albumin: 2.9 g/dL — ABNORMAL LOW (ref 3.5–5.0)
Alkaline Phosphatase: 42 U/L (ref 38–126)
Anion gap: 6 (ref 5–15)
BUN: 17 mg/dL (ref 8–23)
CO2: 26 mmol/L (ref 22–32)
Calcium: 8.9 mg/dL (ref 8.9–10.3)
Chloride: 106 mmol/L (ref 98–111)
Creatinine, Ser: 0.92 mg/dL (ref 0.44–1.00)
GFR, Estimated: >60 mL/min (ref >60)
Glucose, Bld: 155 mg/dL — ABNORMAL HIGH (ref 70–99)
Potassium: 4.2 mmol/L (ref 3.5–5.1)
Sodium: 138 mmol/L (ref 135–145)
Total Bilirubin: 0.4 mg/dL (ref 0.3–1.2)
Total Protein: 5.3 g/dL — ABNORMAL LOW (ref 6.5–8.1)

## 2022-09-25 LAB — URINALYSIS, COMPLETE (UACMP) WITH MICROSCOPIC
Bilirubin Urine: NEGATIVE
Glucose, UA: NEGATIVE mg/dL
Hgb urine dipstick: NEGATIVE
Ketones, ur: NEGATIVE mg/dL
Nitrite: NEGATIVE
Protein, ur: NEGATIVE mg/dL
Specific Gravity, Urine: 1.034 — ABNORMAL HIGH (ref 1.005–1.030)
pH: 5 (ref 5.0–8.0)

## 2022-09-25 LAB — PROTIME-INR
INR: 1.1 (ref 0.8–1.2)
Prothrombin Time: 14.5 seconds (ref 11.4–15.2)

## 2022-09-25 LAB — MAGNESIUM: Magnesium: 1.5 mg/dL — ABNORMAL LOW (ref 1.7–2.4)

## 2022-09-25 MED ORDER — MORPHINE SULFATE (PF) 4 MG/ML IV SOLN: 3.0000 mg | INTRAVENOUS | Status: DC | PRN

## 2022-09-25 MED ORDER — LORAZEPAM 2 MG/ML IJ SOLN: 0.5000 mg | Freq: Four times a day (QID) | INTRAMUSCULAR | Status: DC | PRN

## 2022-09-25 MED ORDER — INSULIN ASPART 100 UNIT/ML IJ SOLN: 0.0000 [IU] | Freq: Four times a day (QID) | INTRAMUSCULAR | Status: DC

## 2022-09-25 MED ORDER — PANTOPRAZOLE SODIUM 40 MG IV SOLR
40.0000 mg | INTRAVENOUS | Status: DC
  Administered 2022-09-25 – 2022-09-27 (×3): 40 mg via INTRAVENOUS
  Filled 2022-09-25 (×3): qty 10

## 2022-09-25 MED ORDER — ACETAMINOPHEN 325 MG PO TABS: 650.0000 mg | ORAL_TABLET | Freq: Four times a day (QID) | ORAL | Status: DC | PRN

## 2022-09-25 MED ORDER — ACETAMINOPHEN 650 MG RE SUPP: 650.0000 mg | Freq: Four times a day (QID) | RECTAL | Status: DC | PRN

## 2022-09-25 MED ORDER — MAGNESIUM SULFATE 2 GM/50ML IV SOLN
2.0000 g | Freq: Once | INTRAVENOUS | Status: AC
  Administered 2022-09-25: 2 g via INTRAVENOUS
  Filled 2022-09-25: qty 50

## 2022-09-25 MED ORDER — DIATRIZOATE MEGLUMINE & SODIUM 66-10 % PO SOLN
90.0000 mL | Freq: Once | ORAL | Status: AC
  Administered 2022-09-25: 90 mL via ORAL
  Filled 2022-09-25: qty 90

## 2022-09-25 NOTE — Progress Notes (Signed)
8-hour plain film reviewed.  Contrast in colon and rectum.  Final read pending.  Will start sips of clears and ice chips.  Berna Bue MD FACS 09/25/2022 6:58 PM

## 2022-09-25 NOTE — Progress Notes (Addendum)
PROGRESS NOTE   [name]  WUJ:[id number]    DOB: 1941/01/23    DOA: 09/24/2022  PCP: Gaspar Garbe, MD   I have briefly reviewed patients previous medical records in [facility].  Chief Complaint  Patient presents with   Abdominal Pain    Brief Narrative:  82 year old female, daughter lives with her, independent, PMH of endometrial carcinoma, robotic assisted laparoscopic vaginal hysterectomy and bilateral salpingo-oophorectomy, s/p external beam radiation therapy of pelvis and chemotherapy, prior SBO's, CKD stage III, anxiety and depression, type II DM, HLD, HTN presented to the ED on 09/24/2022 with complaints of diffuse abdominal pain x 1 day.  Admitted for small bowel obstruction.  General surgery consulting, for now managing conservatively.   Assessment & Plan:  Principal Problem:   SBO (small bowel obstruction) (HCC) Active Problems:   Essential hypertension   Abdominal pain   Dehydration   Acute prerenal azotemia   Leukocytosis   DM2 (diabetes mellitus, type 2) (HCC)   HLD (hyperlipidemia)   GAD (generalized anxiety disorder)   GERD (gastroesophageal reflux disease)   Small bowel obstruction: History of SBO's in the past, has had vaginal hysterectomy followed by chemoradiation for endometrial carcinoma CT abdomen and pelvis: Fluid-filled small bowel with wall thickening and mesenteric fluid collection likely indicating enteritis.  Early obstruction would also be a possibility. General surgery consultation appreciated.  Managing conservatively: N.p.o., no NG tube for now, IV fluids, mobilization, minimize opioids, SBO protocol and monitor. Etiology may be adhesions from prior surgery versus radiation scars.  Hypomagnesemia: Replace and follow.  GERD/hiatal hernia: Continue PPI.  Type II DM: May be on metformin PTA.  Pharmacy yet to complete home med rec. Check A1c.  Monitor CBGs and SSI if needed.  Hypertension: Controlled.  For now hold p.o.  meds/lisinopril.  As needed IV hydralazine.  Hyperlipidemia: Hold statins.  Anxiety disorder As needed Ativan.  Body mass index is 23.73 kg/m.   ACP Documents: None present. DVT prophylaxis: SCDs Start: 09/25/22 0046     Code Status: Full Code:  Family Communication: daughter via phone. Disposition:  Status is: Inpatient Remains inpatient appropriate because: Ongoing SBO.     Consultants:   General surgery  Procedures:     Antimicrobials:      Subjective:  Overall feels better.  Mild abdominal distention but pain has significantly improved.  Had an episode of nausea earlier this morning but no vomiting.  Had been a.m. and flatus early morning on 6/1 and none in the last 24 hours.  Objective:   Vitals:   09/24/22 2356 09/25/22 0500 09/25/22 0612 09/25/22 0822  BP: (!) 144/57  (!) 138/55 (!) 124/56  Pulse: 81  79 81  Resp: 16  18 17   Temp: 98 F (36.7 C)  98 F (36.7 C) 98.7 F (37.1 C)  TempSrc: Oral  Oral Oral  SpO2: 95%  96% 95%  Weight:  62.7 kg    Height:        General exam: Elderly female, moderately built and frail, lying comfortably propped up in bed without distress.  Oral mucosa moist. Respiratory system: Clear to auscultation. Respiratory effort normal. Cardiovascular system: S1 & S2 heard, RRR. No JVD, murmurs, rubs, gallops or clicks. No pedal edema. Gastrointestinal system: Abdomen is nondistended, soft and nontender. No organomegaly or masses felt. Normal bowel sounds heard. Central nervous system: Alert and oriented. No focal neurological deficits. Extremities: Symmetric 5 x 5 power. Skin: No rashes, lesions or ulcers Psychiatry: Judgement and insight appear  normal. Mood & affect appropriate.     Data Reviewed:   I have personally reviewed following labs and imaging studies   CBC: Recent Labs  Lab 09/24/22 1917 09/25/22 0100  WBC 11.4* 11.5*  NEUTROABS 9.1* 9.5*  HGB 13.3 12.0  HCT 40.5 37.4  MCV 89.4 90.3  PLT 356 327     Basic Metabolic Panel: Recent Labs  Lab 09/24/22 1917 09/25/22 0100  NA 139 138  K 5.1 4.2  CL 105 106  CO2 26 26  GLUCOSE 160* 155*  BUN 23 17  CREATININE 0.89 0.92  CALCIUM 9.5 8.9  MG  --  1.5*    Liver Function Tests: Recent Labs  Lab 09/24/22 1917 09/25/22 0100  AST 37 18  ALT 24 21  ALKPHOS 53 42  BILITOT 0.8 0.4  PROT 7.1 5.3*  ALBUMIN 3.7 2.9*    CBG: Recent Labs  Lab 09/24/22 2358 09/25/22 0614  GLUCAP 123* 109*    Microbiology Studies:  No results found for this or any previous visit (from the past 240 hour(s)).  Radiology Studies:  CT ABDOMEN PELVIS W CONTRAST  Result Date: 09/24/2022 CLINICAL DATA:  Acute nonlocalized abdominal pain. Nausea, vomiting, diarrhea. EXAM: CT ABDOMEN AND PELVIS WITH CONTRAST TECHNIQUE: Multidetector CT imaging of the abdomen and pelvis was performed using the standard protocol following bolus administration of intravenous contrast. RADIATION DOSE REDUCTION: This exam was performed according to the departmental dose-optimization program which includes automated exposure control, adjustment of the mA and/or kV according to patient size and/or use of iterative reconstruction technique. CONTRAST:  OMNIPAQUE IOHEXOL 300 MG/ML  SOLN COMPARISON:  CT colonography 06/04/2015 FINDINGS: Lower chest: Pectus excavatum deformity.  Lung bases are clear. Hepatobiliary: No focal liver lesions. The lateral segment of the left lobe of the liver is atrophic, unchanged since prior study and likely congenital. Gallbladder and bile ducts are normal. Pancreas: Unremarkable. No pancreatic ductal dilatation or surrounding inflammatory changes. Spleen: Normal in size without focal abnormality. Adrenals/Urinary Tract: Adrenal glands are unremarkable. Kidneys are normal, without renal calculi, focal lesion, or hydronephrosis. Bladder is unremarkable. Stomach/Bowel: Stomach is mostly decompressed. Small esophageal hiatal hernia. Distal small bowel loops  are fluid-filled without significant distention and with mild wall thickening. There is mesenteric stranding. Changes most likely to represent enteritis. Early obstruction is not excluded. Scattered stool in the colon. No colonic distention or wall thickening. Appendix is normal. Vascular/Lymphatic: Aortic atherosclerosis. No enlarged abdominal or pelvic lymph nodes. Reproductive: No pelvic mass. Other: Small amount of free fluid in the abdomen particularly around the liver. This may be ascites or reactive fluid. No free air. Abdominal wall musculature appears intact. Musculoskeletal: Degenerative changes in the spine. Lumbar scoliosis convex towards the right. IMPRESSION: 1. Fluid-filled small bowel with wall thickening and mesenteric fluid collection likely indicating enteritis. Early obstruction would also be a possibility. 2. Small amount of abdominal ascites. 3. Small esophageal hiatal hernia. 4. Aortic atherosclerosis. Electronically Signed   By: Burman Nieves M.D.   On: 09/24/2022 20:08    Scheduled Meds:    insulin aspart  0-6 Units Subcutaneous Q6H   pantoprazole (PROTONIX) IV  40 mg Intravenous Q24H    Continuous Infusions:    lactated ringers 75 mL/hr at 09/25/22 0004     LOS: 1 day     Marcellus Scott, MD,  FACP, [facility], [facility], [facility], [facility]   [facility] [place]     To contact the attending provider between 7A-7P or the covering  provider during after hours 7P-7A, please log into the web site [email or web address] and access using universal [place] password for that web site. If you do not have the password, please call the hospital operator.  09/25/2022, 10:11 AM

## 2022-09-25 NOTE — H&P (Signed)
History and Physical      [name] ZOX:[id number] DOB: Nov 24, 1940 DOA: 09/24/2022; DOS: 09/25/2022  PCP: Gaspar Garbe, MD  Patient coming from: home   I have personally briefly reviewed patient's old medical records in [facility]  Chief Complaint: abdominal pain  HPI: [name] is a 82 y.o. female with medical history significant for endometrial cancer status postchemotherapy and radiation, type 2 diabetes mellitus, prior small bowel obstruction, hyperlipidemia, essential hypertension, GERD, who is admitted to [facility] on 09/24/2022 by way of transfer from [facility] with about obstruction after presenting from home to the latter facility complaining of abdominal pain.  The patient reports 1 day of progressive abdominal pain. Describes pain as sharp generalized abd discomfort without radiation. Has been constant since onset, and notes exacerbation with movement and with palpation over the abdomen.   Notes associated intermittent nausea over that timeframe resulting in 2-3 episodes of non-bloody/non-bilious emesis, with most recent episode of vomiting occurring just prior to presenting to [facility] this evening.  Notes most recent bowel movement occurred yesterday, and conveys that this was associated with well-formed stool in the absence of any recent loose stool. Reports associated decrease in flatus production. denies any recent melena or hematochezia.  No recent trauma or travel. Denies any recent dysuria, gross hematuria, or change in urinary urgency/frequency. Denies any associated subjective fever, chills, rigors, or generalized myalgias.    Notable past surgical history includes laparoscopic hysterectomy as well as bilateral salpingo-oophorectomy in July 2010.  She also notes h/o SBO, which resolved with conservative measures.   Her medical history is also notable for endometrial cancer, status postchemotherapy as well as radiation,  with these treatments occurring between 2012-2014.     [facility] ED Course:  Vital signs in the ED were notable for the following: Afebrile; initial heart rates in the low 100s, socially decreasing into the 80s following initiation of IV fluids; systolic blood pressures in the 130s 150s; respiratory rate 16-18, oxygen saturation 95 to 100% on room air.  Labs were notable for the following: CMP notable for the following: Bicarbonate 26, creatinine 0.89, BUN/creatinine ratio greater than 20, glucose 160, liver enzymes within normal limits.  Lipase 40.  CBC notable for white blood cell count 11,400.  Of note, EKG was performed at [facility] this evening, but the result of such has not been released at this time.  Imaging and additional notable ED work-up: CT abdomen/pelvis with contrast, performed radiology read, shows fluid-filled small bowels, with findings concerning for early small bowel obstruction, without evidence of abscess or perforation.  [facility] EDP discussed with on-call general surgery, Dr. Luisa Hart, Who conveyed that general surgery will formally consult.  Of note, NGT was not placed at [facility] due to improvement in nausea with prn IV Zofran.     While in the ED, the following were administered: Zofran 4 mg IV x 1, lactated Ringer's x 1 L bolus followed by continuous LR at 75 cc/h.  Subsequently, the patient was admitted to [facility] for further evaluation management of small bowel obstruction, with clinical evidence of dehydration and presenting labs notable for mild leukocytosis.     Review of Systems: As per HPI otherwise 10 point review of systems negative.   Past Medical History:  Diagnosis Date   Allergy    Anxiety    takes ativan as needed   Arthritis    Benign  positional vertigo    Bursitis of shoulder, left    Cancer [facility])    Endometrial ca/Recurrence   CKD (chronic kidney disease), stage III (HCC)    Depression     Diabetes (HCC)    History of colonic polyps    Hypercholesteremia    Hypercholesterolemia    Hypertension    Lumbar pain    Osteoarthritis    Osteoporosis    S/P radiation therapy July 29, 2010-Sep 06, 2010   External beam of pelvis   S/P radiation therapy 09/15/10, 09/24/10, 09/28/2010   Intracavitary brachytherapy   SBO (small bowel obstruction) (HCC)    Status post chemotherapy    carboplatin/paclitaxel x 6 rounds    Past Surgical History:  Procedure Laterality Date   ABDOMINAL HYSTERECTOMY     BREAST LUMPECTOMY  1993   Left breast, benign   ROBOTIC ASSISTED LAPAROSCOPIC VAGINAL HYSTERECTOMY WITH FIBROID REMOVAL  July 2010   bilat. salpingo-oophorectomy    Social History:  reports that she quit smoking about 46 years ago. Her smoking use included cigarettes. She has a 10.00 pack-year smoking history. She has never used smokeless tobacco. She reports that she does not drink alcohol and does not use drugs.   Allergies  Allergen Reactions   Phenobarbital Other (See Comments)    Feeling of uneasiness    Family History  Problem Relation Age of Onset   Breast cancer Mother    Heart disease Father    Breast cancer Sister    Diabetes Sister    Colon cancer Neg Hx    Esophageal cancer Neg Hx    Rectal cancer Neg Hx    Stomach cancer Neg Hx     Family history reviewed and not pertinent    Prior to Admission medications   Medication Sig Start Date End Date Taking? Authorizing Provider  acetaminophen (TYLENOL) 325 MG tablet Take 650 mg by mouth every 6 (six) hours as needed.    [provider]  aspirin 325 MG tablet Take 325 mg by mouth daily.    [provider]  atorvastatin (LIPITOR) 40 MG tablet TK 1 T PO QHS 11/05/15   [provider]  benazepril (LOTENSIN) 40 MG tablet Take 40 mg by mouth daily. 01/06/18   [provider]  calcium carbonate (OS-CAL) 600 MG TABS tablet Take 600 mg by mouth daily with breakfast.    [provider]  cetirizine (ZYRTEC) 10 MG tablet Take 10 mg by mouth daily as needed.    [provider]  Cholecalciferol (VITAMIN D) 125 MCG (5000 UT) CAPS Take 5,000 Units by mouth daily.    [provider]  clobetasol ointment (TEMOVATE) 0.05 % Apply 1 Application topically 2 (two) times a week. 06/30/22   Antionette Char, MD  Folic Acid-Vit B6-Vit B12 (FOLBEE) 2.5-25-1 MG TABS Take 1 tablet by mouth daily.    [provider]  ibuprofen (ADVIL,MOTRIN) 200 MG tablet Take 200 mg by mouth every 6 (six) hours as needed.    [provider]  LORazepam (ATIVAN) 0.5 MG tablet Take 0.25 mg by mouth as needed for anxiety.    [provider]  meclizine (ANTIVERT) 25 MG tablet Take 25 mg by mouth 4 (four) times daily as needed for dizziness or nausea.    [provider]  Multiple Vitamin (MULTIVITAMIN) tablet Take 1 tablet by mouth daily. Centrum silver    [provider]  omeprazole (PRILOSEC) 40 MG capsule Take 1 capsule (40 mg total) by  mouth daily. 11/17/21   Hilarie Fredrickson, MD  Polyethyl Glycol-Propyl Glycol 0.4-0.3 % SOLN Place 1 drop into both eyes daily as needed (for dry eyes).    [provider]     Objective    Physical Exam: Vitals:   09/24/22 1842 09/24/22 1915 09/24/22 2147 09/24/22 2356  BP: (!) 157/91 (!) 141/55 (!) 134/59 (!) 144/57  Pulse: (!) 104 87 87 81  Resp: 16  18 16   Temp: 98.4 F (36.9 C)  98.4 F (36.9 C) 98 F (36.7 C)  TempSrc: Oral   Oral  SpO2: 98% 100% 96% 95%  Weight:      Height:        General: appears to be stated age; alert, oriented Skin: warm, dry, no rash Head:  AT/[place] Mouth:  Oral mucosa membranes appear dry, normal dentition Neck: supple; trachea midline Heart:  RRR; did not appreciate any M/R/G Lungs: CTAB, did not appreciate any wheezes, rales, or rhonchi Abdomen: Hypoactive bowel sounds; soft, ND, mild generalized tenderness to palpation, in the absence of any associated  guarding, rigidity, or rebound tenderness Vascular: 2+ pedal pulses b/l; 2+ radial pulses b/l Extremities: no peripheral edema, no muscle wasting Neuro: strength and sensation intact in upper and lower extremities b/l     Labs on Admission: I have personally reviewed following labs and imaging studies  CBC: Recent Labs  Lab 09/24/22 1917  WBC 11.4*  NEUTROABS 9.1*  HGB 13.3  HCT 40.5  MCV 89.4  PLT 356   Basic Metabolic Panel: Recent Labs  Lab 09/24/22 1917  NA 139  K 5.1  CL 105  CO2 26  GLUCOSE 160*  BUN 23  CREATININE 0.89  CALCIUM 9.5   GFR: Estimated Creatinine Clearance: 42.1 mL/min (by C-G formula based on SCr of 0.89 mg/dL). Liver Function Tests: Recent Labs  Lab 09/24/22 1917  AST 37  ALT 24  ALKPHOS 53  BILITOT 0.8  PROT 7.1  ALBUMIN 3.7   Recent Labs  Lab 09/24/22 1917  LIPASE 40   No results for input(s): "AMMONIA" in the last 168 hours. Coagulation Profile: No results for input(s): "INR", "PROTIME" in the last 168 hours. Cardiac Enzymes: No results for input(s): "CKTOTAL", "CKMB", "CKMBINDEX", "TROPONINI" in the last 168 hours. BNP (last 3 results) No results for input(s): "PROBNP" in the last 8760 hours. HbA1C: No results for input(s): "HGBA1C" in the last 72 hours. CBG: Recent Labs  Lab 09/24/22 2358  GLUCAP 123*   Lipid Profile: No results for input(s): "CHOL", "HDL", "LDLCALC", "TRIG", "CHOLHDL", "LDLDIRECT" in the last 72 hours. Thyroid Function Tests: No results for input(s): "TSH", "T4TOTAL", "FREET4", "T3FREE", "THYROIDAB" in the last 72 hours. Anemia Panel: No results for input(s): "VITAMINB12", "FOLATE", "FERRITIN", "TIBC", "IRON", "RETICCTPCT" in the last 72 hours. Urine analysis:    Component Value Date/Time   COLORURINE STRAW (A) 06/29/2022 1448   APPEARANCEUR CLEAR 06/29/2022 1448   LABSPEC 1.003 (L) 06/29/2022 1448   LABSPEC 1.005 01/13/2009 1232   PHURINE 5.0 06/29/2022 1448   GLUCOSEU NEGATIVE 06/29/2022  1448   HGBUR SMALL (A) 06/29/2022 1448   BILIRUBINUR NEGATIVE 06/29/2022 1448   BILIRUBINUR Negative 01/13/2009 1232   KETONESUR NEGATIVE 06/29/2022 1448   PROTEINUR NEGATIVE 06/29/2022 1448   UROBILINOGEN 0.2 05/16/2013 1557   NITRITE NEGATIVE 06/29/2022 1448   LEUKOCYTESUR SMALL (A) 06/29/2022 1448   LEUKOCYTESUR Moderate 01/13/2009 1232    Radiological Exams on Admission: CT ABDOMEN PELVIS W CONTRAST  Result Date: 09/24/2022 CLINICAL DATA:  Acute  nonlocalized abdominal pain. Nausea, vomiting, diarrhea. EXAM: CT ABDOMEN AND PELVIS WITH CONTRAST TECHNIQUE: Multidetector CT imaging of the abdomen and pelvis was performed using the standard protocol following bolus administration of intravenous contrast. RADIATION DOSE REDUCTION: This exam was performed according to the departmental dose-optimization program which includes automated exposure control, adjustment of the mA and/or kV according to patient size and/or use of iterative reconstruction technique. CONTRAST:  OMNIPAQUE IOHEXOL 300 MG/ML  SOLN COMPARISON:  CT colonography 06/04/2015 FINDINGS: Lower chest: Pectus excavatum deformity.  Lung bases are clear. Hepatobiliary: No focal liver lesions. The lateral segment of the left lobe of the liver is atrophic, unchanged since prior study and likely congenital. Gallbladder and bile ducts are normal. Pancreas: Unremarkable. No pancreatic ductal dilatation or surrounding inflammatory changes. Spleen: Normal in size without focal abnormality. Adrenals/Urinary Tract: Adrenal glands are unremarkable. Kidneys are normal, without renal calculi, focal lesion, or hydronephrosis. Bladder is unremarkable. Stomach/Bowel: Stomach is mostly decompressed. Small esophageal hiatal hernia. Distal small bowel loops are fluid-filled without significant distention and with mild wall thickening. There is mesenteric stranding. Changes most likely to represent enteritis. Early obstruction is not excluded. Scattered stool  in the colon. No colonic distention or wall thickening. Appendix is normal. Vascular/Lymphatic: Aortic atherosclerosis. No enlarged abdominal or pelvic lymph nodes. Reproductive: No pelvic mass. Other: Small amount of free fluid in the abdomen particularly around the liver. This may be ascites or reactive fluid. No free air. Abdominal wall musculature appears intact. Musculoskeletal: Degenerative changes in the spine. Lumbar scoliosis convex towards the right. IMPRESSION: 1. Fluid-filled small bowel with wall thickening and mesenteric fluid collection likely indicating enteritis. Early obstruction would also be a possibility. 2. Small amount of abdominal ascites. 3. Small esophageal hiatal hernia. 4. Aortic atherosclerosis. Electronically Signed   By: Burman Nieves M.D.   On: 09/24/2022 20:08      Assessment/Plan   Principal Problem:   SBO (small bowel obstruction) (HCC) Active Problems:   Essential hypertension   Abdominal pain   Dehydration   Acute prerenal azotemia   Leukocytosis   DM2 (diabetes mellitus, type 2) (HCC)   HLD (hyperlipidemia)   GAD (generalized anxiety disorder)   GERD (gastroesophageal reflux disease)      #) Small bowel obstruction: dx on the basis of acute onset abdominal pain associated with nausea/vomiting and diminished flatus production, with CT abdomen/pelvis, per preliminary radiology report, suggestive of early SBO in the absence of any evidence of perforation or abscess.  In the context of a history of multiple prior abdominal surgeries, most likely etiology stems from postoperative adhesions, also noting that she has a history of prior small bowel striction, which resolved with conservative measures. No evidence of peritoneal signs on initial physical exam. Will initiate conservative measures, and observe for return of normal bowel function, as described below.   Case and imaging were discussed with the on-call general surgeon, Dr.Cornett, who recommended  admission to the hospitalist service for further evaluation and management of presenting SBO, including the conservative measures as described below. Dr.  Luisa Hart to consult, and plans to see patient in the AM.   Of note, NGT was not placed at [facility] due to improvement in patient's nausea with as needed IV Zofran.   Plan: Strict NPO. LR @75  cc per hour. Prn IV morphine. Prn IV Zofran. SCD's for DVT prophylaxis in case the patient should ultimately require surgical intervention.  Recheck CMP and CBC in the morning. Check INR. General surgery consult, as  above.  Incentive parameter A.                #) Leukocytosis: Presenting CBC reflects mildly elevated wbc count of 11,400, which is suspected to be inflammatory in nature in the setting of presenting SBO, with an additional element of hemoconcentration in the context of dehydration. No clinical evidence to suggest underlying infectious process at this time, including no e/o bowel perforation or abscess . In the absence of suspected underlying infxn, criteria for sepsis not currently met. Consequently, will refrain from initiation of abx at this time.    Plan: Repeat CBC with diff in the AM.  IV fluids, as above.  check UA                #) Dehydration: Clinical suspicion for such, including the appearance of dry oral mucous membranes as well as laboratory findings notable for acute prerenal azotemia, while mild tachycardia resolved with interval IV fluids.  Appears to be in the setting of   diminished oral intake over the course the last day in the setting of presenting small bowel obstruction as well as from increased GI losses in the form of associated nausea/vomiting.  No e/o associated hypotension.   Plan: Monitor strict I's and O's.  Daily weights.  CMP in the morning. IVF's in form of lactated Ringer's at 75 cc/h.                 #) Type 2 Diabetes Mellitus: documented history of such.   Appears to be managed via lifestyle modifications, in the absence of any use of insulin or oral hypoglycemic agents as an outpatient.  Hemoglobin A1c was ordered at [facility] earlier today, with result currently pending.  Presenting blood sugar noted to be 160  Plan: In the setting of current n.p.o. status, will pursue accuchecks every 6 hours with low dose SSI.  Follow-up result hemoglobin A1c level.               #) Hyperlipidemia: documented h/o such. On high intensity atorvastatin as outpatient.   Plan: Hold home statin in the setting of current n.p.o. status.                 #) Essential Hypertension: documented h/o such, with outpatient antihypertensive regimen including benazepril.  SBP's in the ED today: 130s to 150s mmHg.   Plan: Close monitoring of subsequent BP via routine VS. hold home benazepril for now in the setting of current n.p.o. status.                #) Generalized anxiety disorder: documented h/o such. On prn Ativan as outpatient, in the absence of any scheduled SSRI or SNRI.  In the setting of current n.p.o. status, will transiently convert her outpatient oral prn Ativan to prn IV Ativan.  Plan: Hold home prn oral Ativan for now.  As needed IV Ativan.                #) GERD: documented h/o such; on Protonix as outpatient.   Plan: Hold home po PPI for now.  Protonix 40 mg IV daily while the patient remains NPO.      DVT prophylaxis: SCD's   Code Status: Full code Family Communication: none Disposition Plan: Per Rounding Team Consults called: EDP at [facility] discussed patient's case with on-call general surgery, Dr. Luisa Hart, Who will formally consult, as further detailed above;  Admission status: Inpatient     I  SPENT GREATER THAN 75  MINUTES IN CLINICAL CARE TIME/MEDICAL DECISION-MAKING IN COMPLETING THIS ADMISSION.      Jessica Born Sheelah Ritacco DO [facility]  From 7PM -  7AM   09/25/2022, 1:06 AM

## 2022-09-25 NOTE — Progress Notes (Signed)
Patient transferred from [facility] and oriented,no c/o of pain.Complain of nausea,but later refused medication.bed in low position and call light in reach,will continue to monitor.

## 2022-09-25 NOTE — Consult Note (Signed)
Reason for Consult: small bowel obstruction  Referring Physician: Dr. Arlean Hopping, [facility]  [name] is an 82 y.o. female.  HPI: Patient is an 82 year old female admitted to the medical service with signs and symptoms of small bowel obstruction versus radiation enteritis.  Patient has a history of endometrial carcinoma treated over 10 years ago with hysterectomy followed by chemotherapy and radiation therapy.  The patient has had a previous episode of small bowel obstruction which resolved with nonsurgical management.  Patient has been followed by Dr. Yancey Flemings from gastroenterology.  Patient developed diffuse abdominal pain 1 day ago.  This was generalized but became more severe and the patient presented to the emergency department for evaluation.  Evaluation included a CT scan of the abdomen and pelvis showing dilated small bowel concerning for partial small bowel obstruction versus enteritis.  Patient was admitted to the medical service and surgical consultation was requested.  Medical history is notable for type 2 diabetes, hyperlipidemia, hypertension, and gastroesophageal reflux disease.  Past Medical History:  Diagnosis Date   Allergy    Anxiety    takes ativan as needed   Arthritis    Benign positional vertigo    Bursitis of shoulder, left    Cancer [facility])    Endometrial ca/Recurrence   CKD (chronic kidney disease), stage III (HCC)    Depression    Diabetes (HCC)    History of colonic polyps    Hypercholesteremia    Hypercholesterolemia    Hypertension    Lumbar pain    Osteoarthritis    Osteoporosis    S/P radiation therapy July 29, 2010-Sep 06, 2010   External beam of pelvis   S/P radiation therapy 09/15/10, 09/24/10, 09/28/2010   Intracavitary brachytherapy   SBO (small bowel obstruction) (HCC)    Status post chemotherapy    carboplatin/paclitaxel x 6 rounds    Past Surgical History:  Procedure Laterality Date   ABDOMINAL HYSTERECTOMY     BREAST  LUMPECTOMY  1993   Left breast, benign   ROBOTIC ASSISTED LAPAROSCOPIC VAGINAL HYSTERECTOMY WITH FIBROID REMOVAL  July 2010   bilat. salpingo-oophorectomy    Family History  Problem Relation Age of Onset   Breast cancer Mother    Heart disease Father    Breast cancer Sister    Diabetes Sister    Colon cancer Neg Hx    Esophageal cancer Neg Hx    Rectal cancer Neg Hx    Stomach cancer Neg Hx     Social History:  reports that she quit smoking about 46 years ago. Her smoking use included cigarettes. She has a 10.00 pack-year smoking history. She has never used smokeless tobacco. She reports that she does not drink alcohol and does not use drugs.  Allergies:  Allergies  Allergen Reactions   Phenobarbital Other (See Comments)    Feeling of uneasiness    Medications: I have reviewed the patient's current medications.  Results for orders placed or performed during the hospital encounter of 09/24/22 (from the past 48 hour(s))  CBC with Differential     Status: Abnormal   Collection Time: 09/24/22  7:17 PM  Result Value Ref Range   WBC 11.4 (H) 4.0 - 10.5 K/uL   RBC 4.53 3.87 - 5.11 MIL/uL   Hemoglobin 13.3 12.0 - 15.0 g/dL   HCT 16.1 09.6 - 04.5 %   MCV 89.4 80.0 - 100.0 fL   MCH 29.4 26.0 - 34.0 pg   MCHC 32.8 30.0 - 36.0  g/dL   RDW 16.1 09.6 - 04.5 %   Platelets 356 150 - 400 K/uL   nRBC 0.0 0.0 - 0.2 %   Neutrophils Relative % 80 %   Neutro Abs 9.1 (H) 1.7 - 7.7 K/uL   Lymphocytes Relative 14 %   Lymphs Abs 1.5 0.7 - 4.0 K/uL   Monocytes Relative 5 %   Monocytes Absolute 0.6 0.1 - 1.0 K/uL   Eosinophils Relative 1 %   Eosinophils Absolute 0.1 0.0 - 0.5 K/uL   Basophils Relative 0 %   Basophils Absolute 0.0 0.0 - 0.1 K/uL   Immature Granulocytes 0 %   Abs Immature Granulocytes 0.03 0.00 - 0.07 K/uL    Comment: Performed at [facility], [address] [place], [place] [address]  Comprehensive metabolic panel     Status: Abnormal   Collection Time:  09/24/22  7:17 PM  Result Value Ref Range   Sodium 139 135 - 145 mmol/L   Potassium 5.1 3.5 - 5.1 mmol/L   Chloride 105 98 - 111 mmol/L   CO2 26 22 - 32 mmol/L   Glucose, Bld 160 (H) 70 - 99 mg/dL    Comment: Glucose reference range applies only to samples taken after fasting for at least 8 hours.   BUN 23 8 - 23 mg/dL   Creatinine, Ser 1.91 0.44 - 1.00 mg/dL   Calcium 9.5 8.9 - 47.8 mg/dL   Total Protein 7.1 6.5 - 8.1 g/dL   Albumin 3.7 3.5 - 5.0 g/dL   AST 37 15 - 41 U/L   ALT 24 0 - 44 U/L   Alkaline Phosphatase 53 38 - 126 U/L   Total Bilirubin 0.8 0.3 - 1.2 mg/dL   GFR, Estimated >29 >56 mL/min    Comment: (NOTE) Calculated using the CKD-EPI Creatinine Equation (2021)    Anion gap 8 5 - 15    Comment: Performed at [facility], [address] [place], [place] [address]  Lipase, blood     Status: None   Collection Time: 09/24/22  7:17 PM  Result Value Ref Range   Lipase 40 11 - 51 U/L    Comment: Performed at [facility], [address] [place], [place] [address]  Glucose, capillary     Status: Abnormal   Collection Time: 09/24/22 11:58 PM  Result Value Ref Range   Glucose-Capillary 123 (H) 70 - 99 mg/dL    Comment: Glucose reference range applies only to samples taken after fasting for at least 8 hours.  CBC with Differential/Platelet     Status: Abnormal   Collection Time: 09/25/22  1:00 AM  Result Value Ref Range   WBC 11.5 (H) 4.0 - 10.5 K/uL   RBC 4.14 3.87 - 5.11 MIL/uL   Hemoglobin 12.0 12.0 - 15.0 g/dL   HCT 69.6 29.5 - 28.4 %   MCV 90.3 80.0 - 100.0 fL   MCH 29.0 26.0 - 34.0 pg   MCHC 32.1 30.0 - 36.0 g/dL   RDW 13.2 44.0 - 10.2 %   Platelets 327 150 - 400 K/uL   nRBC 0.0 0.0 - 0.2 %   Neutrophils Relative % 83 %   Neutro Abs 9.5 (H) 1.7 - 7.7 K/uL   Lymphocytes Relative 10 %   Lymphs Abs 1.2 0.7 - 4.0 K/uL   Monocytes Relative 6 %   Monocytes Absolute 0.7 0.1 - 1.0 K/uL   Eosinophils Relative 1 %   Eosinophils  Absolute 0.1  0.0 - 0.5 K/uL   Basophils Relative 0 %   Basophils Absolute 0.0 0.0 - 0.1 K/uL   Immature Granulocytes 0 %   Abs Immature Granulocytes 0.04 0.00 - 0.07 K/uL    Comment: Performed at [facility], [address] [place], [place] [address]  Comprehensive metabolic panel     Status: Abnormal   Collection Time: 09/25/22  1:00 AM  Result Value Ref Range   Sodium 138 135 - 145 mmol/L   Potassium 4.2 3.5 - 5.1 mmol/L   Chloride 106 98 - 111 mmol/L   CO2 26 22 - 32 mmol/L   Glucose, Bld 155 (H) 70 - 99 mg/dL    Comment: Glucose reference range applies only to samples taken after fasting for at least 8 hours.   BUN 17 8 - 23 mg/dL   Creatinine, Ser 6.04 0.44 - 1.00 mg/dL   Calcium 8.9 8.9 - 54.0 mg/dL   Total Protein 5.3 (L) 6.5 - 8.1 g/dL   Albumin 2.9 (L) 3.5 - 5.0 g/dL   AST 18 15 - 41 U/L   ALT 21 0 - 44 U/L   Alkaline Phosphatase 42 38 - 126 U/L   Total Bilirubin 0.4 0.3 - 1.2 mg/dL   GFR, Estimated >98 >11 mL/min    Comment: (NOTE) Calculated using the CKD-EPI Creatinine Equation (2021)    Anion gap 6 5 - 15    Comment: Performed at [facility], [address] [place], [place] [address]  Magnesium     Status: Abnormal   Collection Time: 09/25/22  1:00 AM  Result Value Ref Range   Magnesium 1.5 (L) 1.7 - 2.4 mg/dL    Comment: Performed at [facility], [address] [place], [place] [address]  Protime-INR     Status: None   Collection Time: 09/25/22  1:26 AM  Result Value Ref Range   Prothrombin Time 14.5 11.4 - 15.2 seconds   INR 1.1 0.8 - 1.2    Comment: (NOTE) INR goal varies based on device and disease states. Performed at [facility], [address], [place], [place] [address]   Glucose, capillary     Status: Abnormal   Collection Time: 09/25/22  6:14 AM  Result Value Ref Range   Glucose-Capillary 109 (H) 70 - 99 mg/dL    Comment: Glucose reference range applies only to samples taken after fasting for at least 8 hours.  Urinalysis,  Complete w Microscopic -Urine, Clean Catch     Status: Abnormal   Collection Time: 09/25/22  6:33 AM  Result Value Ref Range   Color, Urine YELLOW YELLOW   APPearance HAZY (A) CLEAR   Specific Gravity, Urine 1.034 (H) 1.005 - 1.030   pH 5.0 5.0 - 8.0   Glucose, UA NEGATIVE NEGATIVE mg/dL   Hgb urine dipstick NEGATIVE NEGATIVE   Bilirubin Urine NEGATIVE NEGATIVE   Ketones, ur NEGATIVE NEGATIVE mg/dL   Protein, ur NEGATIVE NEGATIVE mg/dL   Nitrite NEGATIVE NEGATIVE   Leukocytes,Ua MODERATE (A) NEGATIVE   RBC / HPF 0-5 0 - 5 RBC/hpf   WBC, UA 21-50 0 - 5 WBC/hpf   Bacteria, UA FEW (A) NONE SEEN   Squamous Epithelial / HPF 0-5 0 - 5 /HPF    Comment: Performed at [facility], [address] [place], [place] [address]    CT ABDOMEN PELVIS W CONTRAST  Result Date: 09/24/2022 CLINICAL DATA:  Acute nonlocalized abdominal pain.  Nausea, vomiting, diarrhea. EXAM: CT ABDOMEN AND PELVIS WITH CONTRAST TECHNIQUE: Multidetector CT imaging of the abdomen and pelvis was performed using the standard protocol following bolus administration of intravenous contrast. RADIATION DOSE REDUCTION: This exam was performed according to the departmental dose-optimization program which includes automated exposure control, adjustment of the mA and/or kV according to patient size and/or use of iterative reconstruction technique. CONTRAST:  OMNIPAQUE IOHEXOL 300 MG/ML  SOLN COMPARISON:  CT colonography 06/04/2015 FINDINGS: Lower chest: Pectus excavatum deformity.  Lung bases are clear. Hepatobiliary: No focal liver lesions. The lateral segment of the left lobe of the liver is atrophic, unchanged since prior study and likely congenital. Gallbladder and bile ducts are normal. Pancreas: Unremarkable. No pancreatic ductal dilatation or surrounding inflammatory changes. Spleen: Normal in size without focal abnormality. Adrenals/Urinary Tract: Adrenal glands are unremarkable. Kidneys are normal, without renal calculi,  focal lesion, or hydronephrosis. Bladder is unremarkable. Stomach/Bowel: Stomach is mostly decompressed. Small esophageal hiatal hernia. Distal small bowel loops are fluid-filled without significant distention and with mild wall thickening. There is mesenteric stranding. Changes most likely to represent enteritis. Early obstruction is not excluded. Scattered stool in the colon. No colonic distention or wall thickening. Appendix is normal. Vascular/Lymphatic: Aortic atherosclerosis. No enlarged abdominal or pelvic lymph nodes. Reproductive: No pelvic mass. Other: Small amount of free fluid in the abdomen particularly around the liver. This may be ascites or reactive fluid. No free air. Abdominal wall musculature appears intact. Musculoskeletal: Degenerative changes in the spine. Lumbar scoliosis convex towards the right. IMPRESSION: 1. Fluid-filled small bowel with wall thickening and mesenteric fluid collection likely indicating enteritis. Early obstruction would also be a possibility. 2. Small amount of abdominal ascites. 3. Small esophageal hiatal hernia. 4. Aortic atherosclerosis. Electronically Signed   By: Burman Nieves M.D.   On: 09/24/2022 20:08    Review of Systems  Constitutional:  Positive for appetite change.  HENT: Negative.    Eyes: Negative.   Respiratory: Negative.    Cardiovascular: Negative.   Gastrointestinal:  Positive for abdominal pain, nausea and vomiting.  Endocrine: Negative.   Genitourinary: Negative.   Musculoskeletal: Negative.   Skin: Negative.   Allergic/Immunologic: Negative.   Neurological: Negative.   Hematological: Negative.   Psychiatric/Behavioral: Negative.      Physical Exam  Blood pressure (!) 124/56, pulse 81, temperature 98.7 F (37.1 C), temperature source Oral, resp. rate 17, height 5\' 4"  (1.626 m), weight 62.7 kg, SpO2 95 %.  CONSTITUTIONAL: no acute distress; conversant; no obvious deformities  EYES: Conjunctiva clear and moist; pupils equal  bilaterally  NECK: trachea midline; no thyroid nodularity  LUNGS: respiratory effort normal & unlabored; no wheeze; no rales  CV: rate and rhythm regular; no significant murmur; no edema bilat lower extremities  GI: abdomen is soft with minimal distention; mild diffuse tenderness to palpation; no palpable masses; no guarding; no rebound  MSK: normal range of motion of extremities; no clubbing; no cyanosis  PSYCH: appropriate affect for situation; alert and oriented to person, place, & time  LYMPHATIC: no palpable cervical lymphadenopathy   Assessment/Plan:  Small bowel obstruction, likely adhesions versus radiation change  NPO, IVF  Hold off on NG placement for now  Will initiate small bowel x-ray protocol with oral contrast  Encouraged OOB, ambulation  Replete magnesium, monitor electrolytes Hx of endometrial cancer  Status postchemotherapy and radiation therapy after hysterectomy HTN GERD  Patient is clinically improved this morning from the time of her presentation yesterday.  She is not passing flatus  or having bowel movements however.  She denies any significant nausea or emesis so we will hold off on placement of a nasogastric tube at this time.  I will initiate the small bowel protocol with oral contrast.  Hopefully the episode will resolve without the need for surgical intervention.  Surgery will follow the patient.  Darnell Level, MD [facility] practice Office: [phone]   Darnell Level 09/25/2022, 9:00 AM

## 2022-09-26 DIAGNOSIS — K56609 Unspecified intestinal obstruction, unspecified as to partial versus complete obstruction: Secondary | ICD-10-CM | POA: Diagnosis not present

## 2022-09-26 LAB — BASIC METABOLIC PANEL
Anion gap: 10 (ref 5–15)
BUN: 13 mg/dL (ref 8–23)
CO2: 25 mmol/L (ref 22–32)
Calcium: 8.2 mg/dL — ABNORMAL LOW (ref 8.9–10.3)
Chloride: 105 mmol/L (ref 98–111)
Creatinine, Ser: 0.83 mg/dL (ref 0.44–1.00)
GFR, Estimated: >60 mL/min (ref >60)
Glucose, Bld: 117 mg/dL — ABNORMAL HIGH (ref 70–99)
Potassium: 3.6 mmol/L (ref 3.5–5.1)
Sodium: 140 mmol/L (ref 135–145)

## 2022-09-26 LAB — GLUCOSE, CAPILLARY
Glucose-Capillary: 105 mg/dL — ABNORMAL HIGH (ref 70–99)
Glucose-Capillary: 177 mg/dL — ABNORMAL HIGH (ref 70–99)
Glucose-Capillary: 151 mg/dL — ABNORMAL HIGH (ref 70–99)

## 2022-09-26 LAB — HEMOGLOBIN A1C
Hgb A1c MFr Bld: 7.6 % — ABNORMAL HIGH (ref 4.8–5.6)
Mean Plasma Glucose: 171 mg/dL

## 2022-09-26 LAB — MAGNESIUM: Magnesium: 2 mg/dL (ref 1.7–2.4)

## 2022-09-26 MED ORDER — ARTIFICIAL TEARS OPHTHALMIC OINT: TOPICAL_OINTMENT | Freq: Every day | OPHTHALMIC | Status: DC | PRN

## 2022-09-26 MED ORDER — ASPIRIN 325 MG PO TABS
325.0000 mg | ORAL_TABLET | Freq: Every day | ORAL | Status: DC
  Administered 2022-09-26 – 2022-09-27 (×2): 325 mg via ORAL
  Filled 2022-09-26 (×2): qty 1

## 2022-09-26 MED ORDER — MECLIZINE HCL 25 MG PO TABS
25.0000 mg | ORAL_TABLET | Freq: Four times a day (QID) | ORAL | Status: DC | PRN
  Administered 2022-09-26: 25 mg via ORAL
  Filled 2022-09-26 (×2): qty 1

## 2022-09-26 MED ORDER — B COMPLEX-C PO TABS
1.0000 | ORAL_TABLET | Freq: Every day | ORAL | Status: DC
  Administered 2022-09-26 – 2022-09-27 (×2): 1 via ORAL
  Filled 2022-09-26 (×2): qty 1

## 2022-09-26 MED ORDER — BENAZEPRIL HCL 20 MG PO TABS
40.0000 mg | ORAL_TABLET | Freq: Every day | ORAL | Status: DC
  Administered 2022-09-26 – 2022-09-27 (×2): 40 mg via ORAL
  Filled 2022-09-26 (×2): qty 2

## 2022-09-26 MED ORDER — ENOXAPARIN SODIUM 40 MG/0.4ML IJ SOSY
40.0000 mg | PREFILLED_SYRINGE | Freq: Every day | INTRAMUSCULAR | Status: DC
  Administered 2022-09-26 – 2022-09-27 (×2): 40 mg via SUBCUTANEOUS
  Filled 2022-09-26 (×2): qty 0.4

## 2022-09-26 MED ORDER — POLYETHYL GLYCOL-PROPYL GLYCOL 0.4-0.3 % OP SOLN: 1.0000 [drp] | Freq: Every day | OPHTHALMIC | Status: DC | PRN

## 2022-09-26 MED ORDER — MECLIZINE HCL 25 MG PO TABS: 25.0000 mg | ORAL_TABLET | Freq: Four times a day (QID) | ORAL | Status: DC | PRN

## 2022-09-26 MED ORDER — ATORVASTATIN CALCIUM 40 MG PO TABS
40.0000 mg | ORAL_TABLET | Freq: Every day | ORAL | Status: DC
  Administered 2022-09-26: 40 mg via ORAL
  Filled 2022-09-26: qty 1

## 2022-09-26 MED ORDER — VITAMIN D 25 MCG (1000 UNIT) PO TABS
5000.0000 [IU] | ORAL_TABLET | Freq: Every day | ORAL | Status: DC
  Administered 2022-09-26 – 2022-09-27 (×2): 5000 [IU] via ORAL
  Filled 2022-09-26 (×2): qty 5

## 2022-09-26 NOTE — Progress Notes (Signed)
PROGRESS NOTE   [name]  WUJ:[id number]    DOB: July 21, 1940    DOA: 09/24/2022  PCP: Gaspar Garbe, MD   I have briefly reviewed patients previous medical records in [facility].  Chief Complaint  Patient presents with   Abdominal Pain    Brief Narrative:  82 year old female, daughter lives with her, independent, PMH of endometrial carcinoma, robotic assisted laparoscopic vaginal hysterectomy and bilateral salpingo-oophorectomy, s/p external beam radiation therapy of pelvis and chemotherapy, prior SBO's, CKD stage III, anxiety and depression, type II DM, HLD, HTN presented to the ED on 09/24/2022 with complaints of diffuse abdominal pain x 1 day.  Admitted for small bowel obstruction.  General surgery consulting, manage conservatively, SBO resolved, advancing diet, hopeful DC home 6/4.   Assessment & Plan:  Principal Problem:   SBO (small bowel obstruction) (HCC) Active Problems:   Essential hypertension   Abdominal pain   Dehydration   Acute prerenal azotemia   Leukocytosis   DM2 (diabetes mellitus, type 2) (HCC)   HLD (hyperlipidemia)   GAD (generalized anxiety disorder)   GERD (gastroesophageal reflux disease)   Small bowel obstruction: History of SBO's in the past, has had vaginal hysterectomy followed by chemoradiation for endometrial carcinoma CT abdomen and pelvis: Fluid-filled small bowel with wall thickening and mesenteric fluid collection likely indicating enteritis.  Early obstruction would also be a possibility. General surgery consulting, manage conservatively (n.p.o., no NG tube needed, IVF, mobilization and SBO protocol), SBO resolved, advancing diet, hopeful DC home 6/4 Etiology may be adhesions from prior surgery versus radiation scars. SBO protocol with p.o. contrast and 8-hour delay on 6/2 showed contrast to the level of the rectum.  Tolerated clear liquids, advancing to full liquids today and soft diet  tomorrow.  Hypomagnesemia: Replaced.  GERD/hiatal hernia: Continue PPI.  Type II DM: Not on needs PTA Check A1c: Pending.  Monitor CBGs and SSI if needed.  Hypertension: BP starting to gradually increase, resumed home dose of lisinopril.  As needed IV hydralazine.  Hyperlipidemia: Resumed atorvastatin  Anxiety disorder As needed Ativan.  GERD: Continue PPI.  Body mass index is 23.46 kg/m.   ACP Documents: None present. DVT prophylaxis: SCDs Start: 09/25/22 0046     Code Status: Full Code:  Family Communication: daughter at bedside Disposition:  DC home tomorrow pending advancing diet.     Consultants:   General surgery  Procedures:     Antimicrobials:      Subjective:  History provided by patient and daughter at bedside.  Per them, patient has had may be about 15 loose stools in the last 24 hours, stools appear like "during colonoscopy prep".  Abdominal pain improved and may be having some hunger pains.  No nausea or vomiting.  Ambulated in the hall yesterday.  Tolerating clear liquid.  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  Objective:   Vitals:   09/25/22 2002 09/26/22 0500 09/26/22 0534 09/26/22 0735  BP: (!) 144/65  (!) 159/66 (!) 159/63  Pulse: 69  69 72  Resp: 20  20 16   Temp: 98.4 F (36.9 C)  97.6 F (36.4 C) 98.2 F (36.8 C)  TempSrc: Oral  Oral Oral  SpO2: 100%  96% 97%  Weight:  62 kg    Height:        General exam: Elderly female, moderately built and frail, lying comfortably propped up in bed without distress.  Oral mucosa moist.  Respiratory system: Clear to auscultation.  No increased work of breathing. Cardiovascular system: S1 & S2 heard, RRR. No JVD, murmurs, rubs, gallops or clicks. No pedal edema. Gastrointestinal system: Abdomen is nondistended, soft and nontender.  No organomegaly or masses appreciated.  Normal bowel sounds  heard. Central nervous system: Alert and oriented. No focal neurological deficits. Extremities: Symmetric 5 x 5 power. Skin: No rashes, lesions or ulcers Psychiatry: Judgement and insight appear normal. Mood & affect appropriate.     Data Reviewed:   I have personally reviewed following labs and imaging studies   CBC: Recent Labs  Lab 09/24/22 1917 09/25/22 0100  WBC 11.4* 11.5*  NEUTROABS 9.1* 9.5*  HGB 13.3 12.0  HCT 40.5 37.4  MCV 89.4 90.3  PLT 356 327    Basic Metabolic Panel: Recent Labs  Lab 09/24/22 1917 09/25/22 0100 09/26/22 0033  NA 139 138 140  K 5.1 4.2 3.6  CL 105 106 105  CO2 26 26 25   GLUCOSE 160* 155* 117*  BUN 23 17 13   CREATININE 0.89 0.92 0.83  CALCIUM 9.5 8.9 8.2*  MG  --  1.5* 2.0    Liver Function Tests: Recent Labs  Lab 09/24/22 1917 09/25/22 0100  AST 37 18  ALT 24 21  ALKPHOS 53 42  BILITOT 0.8 0.4  PROT 7.1 5.3*  ALBUMIN 3.7 2.9*    CBG: Recent Labs  Lab 09/25/22 1836 09/25/22 2352 09/26/22 0545  GLUCAP 99 120* 105*    Microbiology Studies:  No results found for this or any previous visit (from the past 240 hour(s)).  Radiology Studies:  DG Abd Portable 1V-Small Bowel Obstruction Protocol-initial, 8 hr delay  Result Date: 09/25/2022 CLINICAL DATA:  8 hour delayed film EXAM: PORTABLE ABDOMEN - 1 VIEW COMPARISON:  CT abdomen and pelvis 09/24/2022 FINDINGS: Contrast material is demonstrated throughout the colon to the rectum. Changes suggest no evidence of complete obstruction. Scattered gas-filled small bowel. No small or large bowel distention. No radiopaque stones. Degenerative changes in the lumbar spine with lumbar scoliosis convex towards the left. IMPRESSION: Contrast material demonstrated throughout the colon to the rectum suggesting no evidence of complete obstruction. Electronically Signed   By: Burman Nieves M.D.   On: 09/25/2022 19:23   CT ABDOMEN PELVIS W CONTRAST  Result Date: 09/24/2022 CLINICAL DATA:   Acute nonlocalized abdominal pain. Nausea, vomiting, diarrhea. EXAM: CT ABDOMEN AND PELVIS WITH CONTRAST TECHNIQUE: Multidetector CT imaging of the abdomen and pelvis was performed using the standard protocol following bolus administration of intravenous contrast. RADIATION DOSE REDUCTION: This exam was performed according to the departmental dose-optimization program which includes automated exposure control, adjustment of the mA and/or kV according to patient size and/or use of iterative reconstruction technique. CONTRAST:  OMNIPAQUE IOHEXOL 300 MG/ML  SOLN COMPARISON:  CT colonography 06/04/2015 FINDINGS: Lower chest: Pectus excavatum deformity.  Lung bases are clear. Hepatobiliary: No focal liver lesions. The lateral segment of the left lobe of the liver is atrophic, unchanged since prior study and likely congenital. Gallbladder and bile ducts are normal. Pancreas: Unremarkable. No pancreatic ductal dilatation or surrounding inflammatory changes. Spleen: Normal in size without focal abnormality. Adrenals/Urinary Tract: Adrenal glands are unremarkable. Kidneys are normal, without renal calculi, focal lesion, or hydronephrosis. Bladder is unremarkable. Stomach/Bowel: Stomach is mostly decompressed. Small esophageal hiatal hernia. Distal small bowel loops are fluid-filled without significant distention and with mild wall thickening. There is mesenteric stranding. Changes most likely to represent enteritis. Early obstruction is not excluded. Scattered stool in  the colon. No colonic distention or wall thickening. Appendix is normal. Vascular/Lymphatic: Aortic atherosclerosis. No enlarged abdominal or pelvic lymph nodes. Reproductive: No pelvic mass. Other: Small amount of free fluid in the abdomen particularly around the liver. This may be ascites or reactive fluid. No free air. Abdominal wall musculature appears intact. Musculoskeletal: Degenerative changes in the spine. Lumbar scoliosis convex towards the  right. IMPRESSION: 1. Fluid-filled small bowel with wall thickening and mesenteric fluid collection likely indicating enteritis. Early obstruction would also be a possibility. 2. Small amount of abdominal ascites. 3. Small esophageal hiatal hernia. 4. Aortic atherosclerosis. Electronically Signed   By: Burman Nieves M.D.   On: 09/24/2022 20:08    Scheduled Meds:    aspirin  325 mg Oral Daily   atorvastatin  40 mg Oral QHS   B-complex with vitamin C  1 tablet Oral Daily   benazepril  40 mg Oral Daily   cholecalciferol  5,000 Units Oral Daily   insulin aspart  0-6 Units Subcutaneous Q6H   pantoprazole (PROTONIX) IV  40 mg Intravenous Q24H    Continuous Infusions:    lactated ringers 75 mL/hr at 09/26/22 0543     LOS: 2 days     Marcellus Scott, MD,  FACP, [facility], [facility], [facility], [facility]   [facility] [place]     To contact the attending provider between 7A-7P or the covering provider during after hours 7P-7A, please log into the web site [email or web address] and access using universal [place] password for that web site. If you do not have the password, please call the hospital operator.  09/26/2022, 10:47 AM

## 2022-09-26 NOTE — Evaluation (Signed)
Physical Therapy Evaluation Patient Details Name: [name] MRN: [id number] DOB: [birth year] Today's Date: 09/26/2022  History of Present Illness  Patient is an 82 y/o female admitted 09/24/22 with abdominal pain due to SBO.  PMH positive for endometrial cancer s/p robotic laparoscopic vaginal hysterectomy and BSO, XRT and chemo, CKD, prior  SBO, HLD, DM, HTN.  Clinical Impression  Patient presents with decreased mobility due to generalized weakness, decreased balance, history of fall on stairs at home last August, decreased activity tolerance and decreased sensation.  She was previously independent, though sedentary, but working from home living with her daughter who works during the day.  Currently minguard for hallway ambulation without device noting some veering and imbalance and decreased ankle DF with some "head fullness" per pt from change in weather takes Meclizine at home daily.  Given limited sensory systems for balance and new weakness from being NPO and decreased strength and sensation in her feet feel she will benefit from follow up outpatient PT for balance.  PT will continue to follow in acute setting.     Recommendations for follow up therapy are one component of a multi-disciplinary discharge planning process, led by the attending physician.  Recommendations may be updated based on patient status, additional functional criteria and insurance authorization.  Follow Up Recommendations       Assistance Recommended at Discharge Intermittent Supervision/Assistance  Patient can return home with the following  Assistance with cooking/housework;Help with stairs or ramp for entrance;A little help with bathing/dressing/bathroom    Equipment Recommendations None recommended by PT  Recommendations for Other Services       Functional Status Assessment Patient has had a recent decline in their functional status and demonstrates the ability to make significant improvements in function in  a reasonable and predictable amount of time.     Precautions / Restrictions Precautions Precautions: Fall Precaution Comments: fell down stairs last August with no bony injury      Mobility  Bed Mobility Overal bed mobility: Modified Independent                  Transfers Overall transfer level: Modified independent                 General transfer comment: was in the bathroom upon my entry with daughter but got up from toilet and out bathroom on her own with IV pole    Ambulation/Gait Ambulation/Gait assistance: Min guard Gait Distance (Feet): 200 Feet Assistive device: None Gait Pattern/deviations: Step-through pattern, Decreased stride length, Drifts right/left, Wide base of support, Decreased dorsiflexion - right, Decreased dorsiflexion - left       General Gait Details: some imbalance with pt relating no food since Sat morning, veering to R at times, minguard for balance; wearing her sketchers  Stairs            Wheelchair Mobility    Modified Rankin (Stroke Patients Only)       Balance Overall balance assessment: Needs assistance   Sitting balance-Leahy Scale: Good     Standing balance support: During functional activity Standing balance-Leahy Scale: Fair Standing balance comment: washed hands in sink unaided                             Pertinent Vitals/Pain Pain Assessment Pain Assessment: 0-10 Pain Score: 3  Pain Location: abdomen Pain Descriptors / Indicators: Discomfort, Sore Pain Intervention(s): Monitored during session    Home Living Family/patient expects to  be discharged to:: Private residence Living Arrangements: Children Available Help at Discharge: Family;Available PRN/intermittently Type of Home: House Home Access: Stairs to enter Entrance Stairs-Rails: Right;Left;Can reach both Entrance Stairs-Number of Steps: 6 Alternate Level Stairs-Number of Steps: flight Home Layout: Two level;1/2 bath on main  level;Bed/bath upstairs Home Equipment: Agricultural consultant (2 wheels)      Prior Function Prior Level of Function : Independent/Modified Independent             Mobility Comments: 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 [employer]; drives very scheduled and limited distance (Sat to hairdresser and [place]); Daughter works in cardiac rehab       Higher education careers adviser        Extremity/Trunk Assessment   Upper Extremity Assessment Upper Extremity Assessment: Overall WFL for tasks assessed    Lower Extremity Assessment Lower Extremity Assessment: LLE deficits/detail;RLE deficits/detail RLE Deficits / Details: AROM WFL, strength hip flexion 4-/5, knee extension 4+/5, ankle DF 4-/5 RLE Sensation: history of peripheral neuropathy LLE Deficits / Details: AROM WFL, strength hip flexion 4-/5, knee extension 4+/5, ankle DF 4-/5 LLE Sensation: history of peripheral neuropathy    Cervical / Trunk Assessment Cervical / Trunk Assessment: Kyphotic  Communication   Communication: No difficulties  Cognition Arousal/Alertness: Awake/alert Behavior During Therapy: WFL for tasks assessed/performed Overall Cognitive Status: Within Functional Limits for tasks assessed                                          General Comments General comments (skin integrity, edema, etc.): Daughter reports concerns about pt's mobility at home as she sits to answer phones for her job, but plays on cell phone when not on calls; she feels pt more sedentary and with fear of falling; states plans for lift alert.    Exercises     Assessment/Plan    PT Assessment Patient needs continued PT services  PT Problem List Decreased strength;Decreased balance;Decreased mobility;Decreased knowledge of use of DME;Decreased safety awareness       PT Treatment Interventions DME instruction;Functional mobility training;Balance training;Patient/family education;Gait training;Therapeutic exercise;Stair training     PT Goals (Current goals can be found in the Care Plan section)  Acute Rehab PT Goals Patient Stated Goal: to return home possibly tomorrow, get better balance PT Goal Formulation: With patient/family Time For Goal Achievement: 10/10/22 Potential to Achieve Goals: Good    Frequency Min 3X/week     Co-evaluation               AM-PAC PT "6 Clicks" Mobility  Outcome Measure Help needed turning from your back to your side while in a flat bed without using bedrails?: None Help needed moving from lying on your back to sitting on the side of a flat bed without using bedrails?: None Help needed moving to and from a bed to a chair (including a wheelchair)?: A Little Help needed standing up from a chair using your arms (e.g., wheelchair or bedside chair)?: A Little Help needed to walk in hospital room?: A Little Help needed climbing 3-5 steps with a railing? : Total 6 Click Score: 18    End of Session Equipment Utilized During Treatment: Gait belt Activity Tolerance: Patient tolerated treatment well Patient left: in bed;with call bell/phone within reach;with family/visitor present   PT Visit Diagnosis: Other abnormalities of gait and mobility (R26.89);History of falling (Z91.81);Muscle weakness (generalized) (M62.81)    Time: 1610-9604 PT  Time Calculation (min) (ACUTE ONLY): 27 min   Charges:   PT Evaluation $PT Eval Moderate Complexity: 1 Mod PT Treatments $Gait Training: 8-22 mins        Sheran Lawless, PT Acute Rehabilitation Services Office:[phone] 09/26/2022   Elray Mcgregor 09/26/2022, 11:42 AM

## 2022-09-26 NOTE — Plan of Care (Signed)
  Problem: Coping: Goal: Level of anxiety will decrease Outcome: Progressing   Problem: Elimination: Goal: Will not experience complications related to bowel motility Outcome: Progressing   Problem: Pain Managment: Goal: General experience of comfort will improve 09/26/2022 0029 by Lorre Munroe, RN Outcome: Progressing 09/26/2022 0026 by Lorre Munroe, RN Outcome: Progressing   Problem: Safety: Goal: Ability to remain free from injury will improve Outcome: Progressing   Problem: Fluid Volume: Goal: Ability to maintain a balanced intake and output will improve Outcome: Progressing

## 2022-09-26 NOTE — Progress Notes (Signed)
Progress Note     Subjective: Abdomen sore but overall better. Feels like abdomen is much softer. Having loose stools and passing flatus. Denies nausea or vomiting. Ambulating.   Objective: Vital signs in last 24 hours: Temp:  [97.4 F (36.3 C)-98.4 F (36.9 C)] 98.2 F (36.8 C) (06/03 0735) Pulse Rate:  [69-72] 72 (06/03 0735) Resp:  [16-20] 16 (06/03 0735) BP: (134-159)/(56-66) 159/63 (06/03 0735) SpO2:  [96 %-100 %] 97 % (06/03 0735) Weight:  [62 kg] 62 kg (06/03 0500) Last BM Date : 09/25/22  Intake/Output from previous day: 06/02 0701 - 06/03 0700 In: 939.4 [I.V.:939.4] Out: 450 [Urine:450] Intake/Output this shift: No intake/output data recorded.  PE: General: pleasant, WD, WN female who is laying in bed in NAD Lungs: Respiratory effort nonlabored Abd: soft, NT, ND, +BS, no masses, hernias, or organomegaly MS: all 4 extremities are symmetrical with no cyanosis, clubbing, or edema. Psych: A&Ox3 with an appropriate affect.    Lab Results:  Recent Labs    09/24/22 1917 09/25/22 0100  WBC 11.4* 11.5*  HGB 13.3 12.0  HCT 40.5 37.4  PLT 356 327   BMET Recent Labs    09/25/22 0100 09/26/22 0033  NA 138 140  K 4.2 3.6  CL 106 105  CO2 26 25  GLUCOSE 155* 117*  BUN 17 13  CREATININE 0.92 0.83  CALCIUM 8.9 8.2*   PT/INR Recent Labs    09/25/22 0126  LABPROT 14.5  INR 1.1   CMP     Component Value Date/Time   NA 140 09/26/2022 0033   K 3.6 09/26/2022 0033   CL 105 09/26/2022 0033   CO2 25 09/26/2022 0033   GLUCOSE 117 (H) 09/26/2022 0033   BUN 13 09/26/2022 0033   CREATININE 0.83 09/26/2022 0033   CREATININE 1.08 (H) 11/22/2021 1042   CALCIUM 8.2 (L) 09/26/2022 0033   PROT 5.3 (L) 09/25/2022 0100   ALBUMIN 2.9 (L) 09/25/2022 0100   AST 18 09/25/2022 0100   AST 20 11/22/2021 1042   ALT 21 09/25/2022 0100   ALT 20 11/22/2021 1042   ALKPHOS 42 09/25/2022 0100   BILITOT 0.4 09/25/2022 0100   BILITOT 0.3 11/22/2021 1042   GFRNONAA >60  09/26/2022 0033   GFRNONAA 52 (L) 11/22/2021 1042   GFRAA >60 03/28/2019 0838   Lipase     Component Value Date/Time   LIPASE 40 09/24/2022 1917       Studies/Results: DG Abd Portable 1V-Small Bowel Obstruction Protocol-initial, 8 hr delay  Result Date: 09/25/2022 CLINICAL DATA:  8 hour delayed film EXAM: PORTABLE ABDOMEN - 1 VIEW COMPARISON:  CT abdomen and pelvis 09/24/2022 FINDINGS: Contrast material is demonstrated throughout the colon to the rectum. Changes suggest no evidence of complete obstruction. Scattered gas-filled small bowel. No small or large bowel distention. No radiopaque stones. Degenerative changes in the lumbar spine with lumbar scoliosis convex towards the left. IMPRESSION: Contrast material demonstrated throughout the colon to the rectum suggesting no evidence of complete obstruction. Electronically Signed   By: Burman Nieves M.D.   On: 09/25/2022 19:23   CT ABDOMEN PELVIS W CONTRAST  Result Date: 09/24/2022 CLINICAL DATA:  Acute nonlocalized abdominal pain. Nausea, vomiting, diarrhea. EXAM: CT ABDOMEN AND PELVIS WITH CONTRAST TECHNIQUE: Multidetector CT imaging of the abdomen and pelvis was performed using the standard protocol following bolus administration of intravenous contrast. RADIATION DOSE REDUCTION: This exam was performed according to the departmental dose-optimization program which includes automated exposure control, adjustment of the mA and/or  kV according to patient size and/or use of iterative reconstruction technique. CONTRAST:  OMNIPAQUE IOHEXOL 300 MG/ML  SOLN COMPARISON:  CT colonography 06/04/2015 FINDINGS: Lower chest: Pectus excavatum deformity.  Lung bases are clear. Hepatobiliary: No focal liver lesions. The lateral segment of the left lobe of the liver is atrophic, unchanged since prior study and likely congenital. Gallbladder and bile ducts are normal. Pancreas: Unremarkable. No pancreatic ductal dilatation or surrounding inflammatory  changes. Spleen: Normal in size without focal abnormality. Adrenals/Urinary Tract: Adrenal glands are unremarkable. Kidneys are normal, without renal calculi, focal lesion, or hydronephrosis. Bladder is unremarkable. Stomach/Bowel: Stomach is mostly decompressed. Small esophageal hiatal hernia. Distal small bowel loops are fluid-filled without significant distention and with mild wall thickening. There is mesenteric stranding. Changes most likely to represent enteritis. Early obstruction is not excluded. Scattered stool in the colon. No colonic distention or wall thickening. Appendix is normal. Vascular/Lymphatic: Aortic atherosclerosis. No enlarged abdominal or pelvic lymph nodes. Reproductive: No pelvic mass. Other: Small amount of free fluid in the abdomen particularly around the liver. This may be ascites or reactive fluid. No free air. Abdominal wall musculature appears intact. Musculoskeletal: Degenerative changes in the spine. Lumbar scoliosis convex towards the right. IMPRESSION: 1. Fluid-filled small bowel with wall thickening and mesenteric fluid collection likely indicating enteritis. Early obstruction would also be a possibility. 2. Small amount of abdominal ascites. 3. Small esophageal hiatal hernia. 4. Aortic atherosclerosis. Electronically Signed   By: Burman Nieves M.D.   On: 09/24/2022 20:08    Anti-infectives: Anti-infectives (From admission, onward)    None        Assessment/Plan  SBO - SBO protocol done with PO contrast and 8h delay showed contrast to level of rectum - clinically pt having bowel function and denies nausea - abdomen sore but not peritonitic - start FLD today and if tolerating likely advance to soft diet tomorrow AM, may be able to DC tomorrow if tolerating soft diet - updated her daughter by phone as well - no indication for urgent/emergent surgical intervention   FEN: FLD, IVF per TRH  VTE: ok to have SQH or LMWH from surgery standpoint ID: no current abx    LOS: 2 days   I reviewed hospitalist notes, last 24 h vitals and pain scores, last 48 h intake and output, last 24 h labs and trends, and last 24 h imaging results.   Juliet Rude, [facility] Surgery 09/26/2022, 10:15 AM Please see Amion for pager number during day hours 7:00am-4:30pm

## 2022-09-26 NOTE — TOC Initial Note (Signed)
Transition of Care (TOC) - Initial/Assessment Note   Spoke to patient and daughter at bedside. Both live together . Patient has walker at home already , no DME recommendations. PT recommended OP PT. Patient would like OP PT at [facility]. Order entered and information placed on AVS. Secure chatted MD for signature .Patient has transportation.  Patient Details  Name: [name] MRN: [id number] Date of Birth: October 21, 1940  Transition of Care [facility]) CM/SW Contact:    Kingsley Plan, RN Phone Number: 09/26/2022, 12:09 PM  Clinical Narrative:                   Expected Discharge Plan: Home/Self Care Barriers to Discharge: Continued Medical Work up   Patient Goals and CMS Choice Patient states their goals for this hospitalization and ongoing recovery are:: to return to home     [place] ownership interest in [facility].provided to:: Patient    Expected Discharge Plan and Services   Discharge Planning Services: CM Consult Post Acute Care Choice:  (OP PT) Living arrangements for the past 2 months: Single Family Home                 DME Arranged: N/A         HH Arranged:  (OP PT)          Prior Living Arrangements/Services Living arrangements for the past 2 months: Single Family Home Lives with:: Adult Children Patient language and need for interpreter reviewed:: Yes Do you feel safe going back to the place where you live?: Yes      Need for Family Participation in Patient Care: Yes (Comment) Care giver support system in place?: Yes (comment) Current home services: DME Criminal Activity/Legal Involvement Pertinent to Current Situation/Hospitalization: No - Comment as needed  Activities of Daily Living   ADL Screening (condition at time of admission) Patient's cognitive ability adequate to safely complete daily activities?: Yes Is the patient deaf or have difficulty hearing?: Yes Does the patient have difficulty seeing, even when wearing  glasses/contacts?: Yes Does the patient have difficulty concentrating, remembering, or making decisions?: No Patient able to express need for assistance with ADLs?: Yes Independently performs ADLs?: Yes (appropriate for developmental age)  Permission Sought/Granted   Permission granted to share information with : Yes, Verbal Permission Granted  Share Information with NAME: daughter [name]           Emotional Assessment Appearance:: Appears stated age Attitude/Demeanor/Rapport: Engaged Affect (typically observed): Accepting Orientation: : Oriented to Self, Oriented to Place, Oriented to  Time, Oriented to Situation Alcohol / Substance Use: Not Applicable Psych Involvement: No (comment)  Admission diagnosis:  SBO (small bowel obstruction) (HCC) [K56.609] Patient Active Problem List   Diagnosis Date Noted   Abdominal pain 09/25/2022   Dehydration 09/25/2022   Acute prerenal azotemia 09/25/2022   Leukocytosis 09/25/2022   DM2 (diabetes mellitus, type 2) (HCC) 09/25/2022   HLD (hyperlipidemia) 09/25/2022   GAD (generalized anxiety disorder) 09/25/2022   GERD (gastroesophageal reflux disease) 09/25/2022   IDA (iron deficiency anemia) 11/22/2021   Osteoporosis 08/30/2021   Acute pain of right shoulder 09/30/2019   Myofascial pain 09/18/2019   Cervical radiculopathy 09/12/2019   Essential hypertension 05/18/2013   SBO (small bowel obstruction) (HCC) 05/17/2013   Small bowel obstruction (HCC) 05/16/2013   Malignant neoplasm of corpus uteri, except isthmus (HCC) 05/16/2011   PCP:  Gaspar Garbe, MD Pharmacy:   [id number] - [place], [place] - [address]OSS [address] [place] [address] Phone: [phone] Fax: [phone]     Social Determinants of Health (SDOH) Social History: SDOH Screenings   Tobacco Use: Medium Risk (09/25/2022)   SDOH Interventions:     Readmission Risk Interventions     No data  to display

## 2022-09-27 DIAGNOSIS — K56609 Unspecified intestinal obstruction, unspecified as to partial versus complete obstruction: Secondary | ICD-10-CM | POA: Diagnosis not present

## 2022-09-27 LAB — BASIC METABOLIC PANEL
Anion gap: 8 (ref 5–15)
BUN: 11 mg/dL (ref 8–23)
CO2: 28 mmol/L (ref 22–32)
Calcium: 8.4 mg/dL — ABNORMAL LOW (ref 8.9–10.3)
Chloride: 105 mmol/L (ref 98–111)
Creatinine, Ser: 0.83 mg/dL (ref 0.44–1.00)
GFR, Estimated: >60 mL/min (ref >60)
Glucose, Bld: 111 mg/dL — ABNORMAL HIGH (ref 70–99)
Potassium: 3.6 mmol/L (ref 3.5–5.1)
Sodium: 141 mmol/L (ref 135–145)

## 2022-09-27 LAB — GLUCOSE, CAPILLARY
Glucose-Capillary: 113 mg/dL — ABNORMAL HIGH (ref 70–99)
Glucose-Capillary: 127 mg/dL — ABNORMAL HIGH (ref 70–99)

## 2022-09-27 MED ORDER — DOCUSATE SODIUM 100 MG PO CAPS: 100.0000 mg | ORAL_CAPSULE | Freq: Every day | ORAL | Status: DC | PRN

## 2022-09-27 MED ORDER — POLYETHYLENE GLYCOL 3350 17 G PO PACK: 17.0000 g | PACK | Freq: Every day | ORAL | Status: DC | PRN

## 2022-09-27 MED ORDER — POLYETHYLENE GLYCOL 3350 17 G PO PACK: 17.0000 g | PACK | Freq: Every day | ORAL | 0 refills | Status: AC | PRN

## 2022-09-27 NOTE — Care Management Important Message (Signed)
Important Message  Patient Details  Name: [name] MRN: [id number] Date of Birth: 1940-08-09   Medicare Important Message Given:  Yes     Sherilyn Banker 09/27/2022, 4:30 PM

## 2022-09-27 NOTE — Plan of Care (Signed)
Problem: Education: Goal: Knowledge of General Education information will improve Description: Including pain rating scale, medication(s)/side effects and non-pharmacologic comfort measures 09/27/2022 1311 by Letta Moynahan, RN Outcome: Adequate for Discharge 09/27/2022 1130 by Letta Moynahan, RN Outcome: Progressing   Problem: Health Behavior/Discharge Planning: Goal: Ability to manage health-related needs will improve 09/27/2022 1311 by Letta Moynahan, RN Outcome: Adequate for Discharge 09/27/2022 1130 by Letta Moynahan, RN Outcome: Progressing   Problem: Clinical Measurements: Goal: Ability to maintain clinical measurements within normal limits will improve 09/27/2022 1311 by Letta Moynahan, RN Outcome: Adequate for Discharge 09/27/2022 1130 by Letta Moynahan, RN Outcome: Progressing Goal: Will remain free from infection 09/27/2022 1311 by Letta Moynahan, RN Outcome: Adequate for Discharge 09/27/2022 1130 by Letta Moynahan, RN Outcome: Progressing Goal: Diagnostic test results will improve 09/27/2022 1311 by Letta Moynahan, RN Outcome: Adequate for Discharge 09/27/2022 1130 by Letta Moynahan, RN Outcome: Progressing Goal: Respiratory complications will improve 09/27/2022 1311 by Letta Moynahan, RN Outcome: Adequate for Discharge 09/27/2022 1130 by Letta Moynahan, RN Outcome: Progressing Goal: Cardiovascular complication will be avoided 09/27/2022 1311 by Letta Moynahan, RN Outcome: Adequate for Discharge 09/27/2022 1130 by Letta Moynahan, RN Outcome: Progressing   Problem: Activity: Goal: Risk for activity intolerance will decrease 09/27/2022 1311 by Letta Moynahan, RN Outcome: Adequate for Discharge 09/27/2022 1130 by Letta Moynahan, RN Outcome: Progressing   Problem: Nutrition: Goal: Adequate nutrition will be maintained 09/27/2022 1311 by Letta Moynahan, RN Outcome: Adequate for Discharge 09/27/2022 1130 by Letta Moynahan, RN Outcome: Progressing   Problem: Coping: Goal: Level of anxiety  will decrease 09/27/2022 1311 by Letta Moynahan, RN Outcome: Adequate for Discharge 09/27/2022 1130 by Letta Moynahan, RN Outcome: Progressing   Problem: Elimination: Goal: Will not experience complications related to bowel motility 09/27/2022 1311 by Letta Moynahan, RN Outcome: Adequate for Discharge 09/27/2022 1130 by Letta Moynahan, RN Outcome: Progressing Goal: Will not experience complications related to urinary retention 09/27/2022 1311 by Letta Moynahan, RN Outcome: Adequate for Discharge 09/27/2022 1130 by Letta Moynahan, RN Outcome: Progressing   Problem: Pain Managment: Goal: General experience of comfort will improve 09/27/2022 1311 by Letta Moynahan, RN Outcome: Adequate for Discharge 09/27/2022 1130 by Letta Moynahan, RN Outcome: Progressing   Problem: Safety: Goal: Ability to remain free from injury will improve 09/27/2022 1311 by Letta Moynahan, RN Outcome: Adequate for Discharge 09/27/2022 1130 by Letta Moynahan, RN Outcome: Progressing   Problem: Skin Integrity: Goal: Risk for impaired skin integrity will decrease 09/27/2022 1311 by Letta Moynahan, RN Outcome: Adequate for Discharge 09/27/2022 1130 by Letta Moynahan, RN Outcome: Progressing   Problem: Education: Goal: Ability to describe self-care measures that may prevent or decrease complications (Diabetes Survival Skills Education) will improve 09/27/2022 1311 by Letta Moynahan, RN Outcome: Adequate for Discharge 09/27/2022 1130 by Letta Moynahan, RN Outcome: Progressing Goal: Individualized Educational Video(s) 09/27/2022 1311 by Letta Moynahan, RN Outcome: Adequate for Discharge 09/27/2022 1130 by Letta Moynahan, RN Outcome: Progressing   Problem: Coping: Goal: Ability to adjust to condition or change in health will improve 09/27/2022 1311 by Letta Moynahan, RN Outcome: Adequate for Discharge 09/27/2022 1130 by Letta Moynahan, RN Outcome: Progressing   Problem: Fluid Volume: Goal: Ability to maintain a balanced intake and  output will improve 09/27/2022 1311 by Letta Moynahan, RN Outcome: Adequate for Discharge 09/27/2022 1130 by Wallace Cullens,  Jill Side, RN Outcome: Progressing   Problem: Health Behavior/Discharge Planning: Goal: Ability to identify and utilize available resources and services will improve 09/27/2022 1311 by Letta Moynahan, RN Outcome: Adequate for Discharge 09/27/2022 1130 by Letta Moynahan, RN Outcome: Progressing Goal: Ability to manage health-related needs will improve 09/27/2022 1311 by Letta Moynahan, RN Outcome: Adequate for Discharge 09/27/2022 1130 by Letta Moynahan, RN Outcome: Progressing   Problem: Metabolic: Goal: Ability to maintain appropriate glucose levels will improve 09/27/2022 1311 by Letta Moynahan, RN Outcome: Adequate for Discharge 09/27/2022 1130 by Letta Moynahan, RN Outcome: Progressing   Problem: Nutritional: Goal: Maintenance of adequate nutrition will improve 09/27/2022 1311 by Letta Moynahan, RN Outcome: Adequate for Discharge 09/27/2022 1130 by Letta Moynahan, RN Outcome: Progressing Goal: Progress toward achieving an optimal weight will improve 09/27/2022 1311 by Letta Moynahan, RN Outcome: Adequate for Discharge 09/27/2022 1130 by Letta Moynahan, RN Outcome: Progressing   Problem: Skin Integrity: Goal: Risk for impaired skin integrity will decrease 09/27/2022 1311 by Letta Moynahan, RN Outcome: Adequate for Discharge 09/27/2022 1130 by Letta Moynahan, RN Outcome: Progressing   Problem: Tissue Perfusion: Goal: Adequacy of tissue perfusion will improve 09/27/2022 1311 by Letta Moynahan, RN Outcome: Adequate for Discharge 09/27/2022 1130 by Letta Moynahan, RN Outcome: Progressing

## 2022-09-27 NOTE — Discharge Instructions (Signed)

## 2022-09-27 NOTE — Discharge Summary (Signed)
Physician Discharge Summary  [name] NFA:[id number] DOB: 07-May-1940  PCP: Gaspar Garbe, MD  Admitted from: Home Discharged to: Home  Admit date: 09/24/2022 Discharge date: 09/27/2022  Recommendations for Outpatient Follow-up:    Follow-up Information     Connect with your PCP/Specialist as discussed. Schedule an appointment as soon as possible for a visit .   Contact information: [email or web address] Call our physician referral line at [phone].        [facility] at [facility] Follow up.   Specialty: Rehabilitation Contact information: [address]  [address] [id number] [place] KVQQ[place] [address] [phone]        Tisovec, Adelfa Koh, MD. Schedule an appointment as soon as possible for a visit in 1 week(s).   Specialty: Internal Medicine Why: To be seen with repeat labs (CBC & BMP). Contact information: [address] [place] [address] [phone]                  Home Health: Outpatient PT    Equipment/Devices: None    Discharge Condition: Improved and stable.   Code Status: Full Code Diet recommendation:  Discharge Diet Orders (From admission, onward)     Start     Ordered   09/27/22 0000  Diet - low sodium heart healthy        09/27/22 1237             Discharge Diagnoses:  Principal Problem:   SBO (small bowel obstruction) (HCC) Active Problems:   Essential hypertension   Abdominal pain   Dehydration   Acute prerenal azotemia   Leukocytosis   DM2 (diabetes mellitus, type 2) (HCC)   HLD (hyperlipidemia)   GAD (generalized anxiety disorder)   GERD (gastroesophageal reflux disease)   Brief Summary: 82 year old female, daughter lives with her, independent, PMH of endometrial carcinoma, robotic assisted laparoscopic vaginal hysterectomy and bilateral salpingo-oophorectomy, s/p external beam radiation therapy of pelvis and  chemotherapy, prior SBO's, CKD stage III, anxiety and depression, type II DM, HLD, HTN presented to the ED on 09/24/2022 with complaints of diffuse abdominal pain x 1 day.  Admitted for small bowel obstruction.  General surgery consulting, manage conservatively, SBO resolved.     Assessment & Plan:    Small bowel obstruction: History of SBO's in the past, has had vaginal hysterectomy followed by chemoradiation for endometrial carcinoma CT abdomen and pelvis: Fluid-filled small bowel with wall thickening and mesenteric fluid collection likely indicating enteritis.  Early obstruction would also be a possibility. General surgery consulted, managed conservatively (n.p.o., no NG tube needed, IVF, mobilization and SBO protocol), SBO resolved, advanced diet which she has tolerated.  General surgery has seen today and cleared her for discharge home. Etiology may be adhesions from prior surgery versus radiation scars.   Hypomagnesemia: Replaced.   GERD/hiatal hernia: Continue PPI.   Type II DM: Not on needs PTA Check A1c: 7.6. Recommend close outpatient follow-up with PCP to determine if this can be managed with diet alone given her very advanced age or will need initiation of some medications.  In the hospital CBG of pain reasonably controlled ranging from 105-177 as noted below   Hypertension: Continue home dose of lisinopril.  Blood pressures are reasonably controlled except but most recent one documented is 143/127 at 7 am which was likely an error.  Requested RN to recheck and document appropriate BP prior to discharge.   Hyperlipidemia: Resumed atorvastatin   Anxiety disorder As needed Ativan.  GERD: Continue PPI.   Body mass index is 23.46 kg/m.      Consultants:   General surgery   Procedures:     Discharge Instructions  Discharge Instructions     Ambulatory referral to Physical Therapy   Complete by: As directed    Iontophoresis - 4 mg/ml of dexamethasone: No    T.E.N.S. Unit Evaluation and Dispense as Indicated: No   Call MD for:  difficulty breathing, headache or visual disturbances   Complete by: As directed    Call MD for:  extreme fatigue   Complete by: As directed    Call MD for:  persistant dizziness or light-headedness   Complete by: As directed    Call MD for:  persistant nausea and vomiting   Complete by: As directed    Call MD for:  severe uncontrolled pain   Complete by: As directed    Call MD for:  temperature >100.4   Complete by: As directed    Diet - low sodium heart healthy   Complete by: As directed    Increase activity slowly   Complete by: As directed         Medication List     TAKE these medications    acetaminophen 325 MG tablet Commonly known as: TYLENOL Take 650 mg by mouth every 6 (six) hours as needed.   aspirin 325 MG tablet Take 325 mg by mouth daily.   atorvastatin 40 MG tablet Commonly known as: LIPITOR Take 40 mg by mouth at bedtime.   benazepril 40 MG tablet Commonly known as: LOTENSIN Take 40 mg by mouth daily.   cetirizine 10 MG tablet Commonly known as: ZYRTEC Take 10 mg by mouth daily as needed.   clobetasol ointment 0.05 % Commonly known as: TEMOVATE Apply 1 Application topically 2 (two) times a week.   Folbee Plus Tabs Take 1 tablet by mouth daily.   ibuprofen 200 MG tablet Commonly known as: ADVIL Take 200 mg by mouth every 6 (six) hours as needed.   LORazepam 0.5 MG tablet Commonly known as: ATIVAN Take 0.25 mg by mouth as needed for anxiety.   meclizine 25 MG tablet Commonly known as: ANTIVERT Take 25 mg by mouth 4 (four) times daily as needed for dizziness or nausea.   multivitamin tablet Take 1 tablet by mouth daily. Centrum silver   omeprazole 40 MG capsule Commonly known as: PRILOSEC Take 1 capsule (40 mg total) by mouth daily.   Polyethyl Glycol-Propyl Glycol 0.4-0.3 % Soln Place 1 drop into both eyes daily as needed (for dry eyes).   polyethylene glycol  17 g packet Commonly known as: MIRALAX / GLYCOLAX Take 17 g by mouth daily as needed for mild constipation or moderate constipation.   Vitamin D 125 MCG (5000 UT) Caps Take 5,000 Units by mouth daily.       Allergies  Allergen Reactions   Phenobarbital Other (See Comments)    Feeling of uneasiness      Procedures/Studies: DG Abd Portable 1V-Small Bowel Obstruction Protocol-initial, 8 hr delay  Result Date: 09/25/2022 CLINICAL DATA:  8 hour delayed film EXAM: PORTABLE ABDOMEN - 1 VIEW COMPARISON:  CT abdomen and pelvis 09/24/2022 FINDINGS: Contrast material is demonstrated throughout the colon to the rectum. Changes suggest no evidence of complete obstruction. Scattered gas-filled small bowel. No small or large bowel distention. No radiopaque stones. Degenerative changes in the lumbar spine with lumbar scoliosis convex towards the left. IMPRESSION: Contrast material demonstrated throughout the colon to the  rectum suggesting no evidence of complete obstruction. Electronically Signed   By: Burman Nieves M.D.   On: 09/25/2022 19:23   CT ABDOMEN PELVIS W CONTRAST  Result Date: 09/24/2022 CLINICAL DATA:  Acute nonlocalized abdominal pain. Nausea, vomiting, diarrhea. EXAM: CT ABDOMEN AND PELVIS WITH CONTRAST TECHNIQUE: Multidetector CT imaging of the abdomen and pelvis was performed using the standard protocol following bolus administration of intravenous contrast. RADIATION DOSE REDUCTION: This exam was performed according to the departmental dose-optimization program which includes automated exposure control, adjustment of the mA and/or kV according to patient size and/or use of iterative reconstruction technique. CONTRAST:  OMNIPAQUE IOHEXOL 300 MG/ML  SOLN COMPARISON:  CT colonography 06/04/2015 FINDINGS: Lower chest: Pectus excavatum deformity.  Lung bases are clear. Hepatobiliary: No focal liver lesions. The lateral segment of the left lobe of the liver is atrophic, unchanged since  prior study and likely congenital. Gallbladder and bile ducts are normal. Pancreas: Unremarkable. No pancreatic ductal dilatation or surrounding inflammatory changes. Spleen: Normal in size without focal abnormality. Adrenals/Urinary Tract: Adrenal glands are unremarkable. Kidneys are normal, without renal calculi, focal lesion, or hydronephrosis. Bladder is unremarkable. Stomach/Bowel: Stomach is mostly decompressed. Small esophageal hiatal hernia. Distal small bowel loops are fluid-filled without significant distention and with mild wall thickening. There is mesenteric stranding. Changes most likely to represent enteritis. Early obstruction is not excluded. Scattered stool in the colon. No colonic distention or wall thickening. Appendix is normal. Vascular/Lymphatic: Aortic atherosclerosis. No enlarged abdominal or pelvic lymph nodes. Reproductive: No pelvic mass. Other: Small amount of free fluid in the abdomen particularly around the liver. This may be ascites or reactive fluid. No free air. Abdominal wall musculature appears intact. Musculoskeletal: Degenerative changes in the spine. Lumbar scoliosis convex towards the right. IMPRESSION: 1. Fluid-filled small bowel with wall thickening and mesenteric fluid collection likely indicating enteritis. Early obstruction would also be a possibility. 2. Small amount of abdominal ascites. 3. Small esophageal hiatal hernia. 4. Aortic atherosclerosis. Electronically Signed   By: Burman Nieves M.D.   On: 09/24/2022 20:08      Subjective: Seen this morning.  Daughter at bedside.  Reports feeling much better.  May be had 1 or 2 BMs overnight, mushy stools, soiled her underwear.  Since this morning no BMs.  No abdominal pain, nausea or vomiting.  As per RN, tolerated 50% of her soft diet since this morning.  Discharge Exam:  Vitals:   09/26/22 1951 09/27/22 0500 09/27/22 0502 09/27/22 0744  BP: (!) 149/61  (!) 144/57 (!) 143/127  Pulse: 63  60 65  Resp: 19  16  16   Temp: (!) 97.5 F (36.4 C)  97.8 F (36.6 C) 97.9 F (36.6 C)  TempSrc: Oral  Oral Oral  SpO2: 99%  96% 99%  Weight:  67 kg    Height:        General exam: Elderly female, moderately built and frail, lying comfortably propped up in bed without distress.  Oral mucosa moist. Respiratory system: Clear to auscultation.  No increased work of breathing. Cardiovascular system: S1 & S2 heard, RRR. No JVD, murmurs, rubs, gallops or clicks. No pedal edema. Gastrointestinal system: Abdomen is nondistended, soft and nontender.  No organomegaly or masses appreciated.  Normal bowel sounds heard. Central nervous system: Alert and oriented. No focal neurological deficits. Extremities: Symmetric 5 x 5 power. Skin: No rashes, lesions or ulcers Psychiatry: Judgement and insight appear normal. Mood & affect appropriate.    The results of significant diagnostics from this  hospitalization (including imaging, microbiology, ancillary and laboratory) are listed below for reference.     Microbiology: No results found for this or any previous visit (from the past 240 hour(s)).   Labs: CBC: Recent Labs  Lab 09/24/22 1917 09/25/22 0100  WBC 11.4* 11.5*  NEUTROABS 9.1* 9.5*  HGB 13.3 12.0  HCT 40.5 37.4  MCV 89.4 90.3  PLT 356 327    Basic Metabolic Panel: Recent Labs  Lab 09/24/22 1917 09/25/22 0100 09/26/22 0033 09/27/22 0111  NA 139 138 140 141  K 5.1 4.2 3.6 3.6  CL 105 106 105 105  CO2 26 26 25 28   GLUCOSE 160* 155* 117* 111*  BUN 23 17 13 11   CREATININE 0.89 0.92 0.83 0.83  CALCIUM 9.5 8.9 8.2* 8.4*  MG  --  1.5* 2.0  --     Liver Function Tests: Recent Labs  Lab 09/24/22 1917 09/25/22 0100  AST 37 18  ALT 24 21  ALKPHOS 53 42  BILITOT 0.8 0.4  PROT 7.1 5.3*  ALBUMIN 3.7 2.9*    CBG: Recent Labs  Lab 09/26/22 0545 09/26/22 1145 09/26/22 1850 09/27/22 0022 09/27/22 0555  GLUCAP 105* 177* 151* 113* 127*    Hgb A1c Recent Labs    09/24/22 2332  HGBA1C  7.6*    Urinalysis    Component Value Date/Time   COLORURINE YELLOW 09/25/2022 0633   APPEARANCEUR HAZY (A) 09/25/2022 0633   LABSPEC 1.034 (H) 09/25/2022 0633   LABSPEC 1.005 01/13/2009 1232   PHURINE 5.0 09/25/2022 0633   GLUCOSEU NEGATIVE 09/25/2022 0633   HGBUR NEGATIVE 09/25/2022 0633   BILIRUBINUR NEGATIVE 09/25/2022 0633   BILIRUBINUR Negative 01/13/2009 1232   KETONESUR NEGATIVE 09/25/2022 0633   PROTEINUR NEGATIVE 09/25/2022 0633   UROBILINOGEN 0.2 05/16/2013 1557   NITRITE NEGATIVE 09/25/2022 0633   LEUKOCYTESUR MODERATE (A) 09/25/2022 0633   LEUKOCYTESUR Moderate 01/13/2009 1232    Discussed in detail with patient's daughter at bedside, updated care and answered all questions.  Time coordinating discharge: 25 minutes  SIGNED:  Marcellus Scott, MD,  FACP, [facility], [facility], [facility], [facility]   [facility] [place]     To contact the attending provider between 7A-7P or the covering provider during after hours 7P-7A, please log into the web site [email or web address] and access using universal  password for that web site. If you do not have the password, please call the hospital operator.

## 2022-09-27 NOTE — Progress Notes (Signed)
Physical Therapy Treatment Patient Details Name: [name] MRN: [id number] DOB: 06-30-1940 Today's Date: 09/27/2022   History of Present Illness Patient is an 82 y/o female admitted 09/24/22 with abdominal pain due to SBO.  PMH positive for endometrial cancer s/p robotic laparoscopic vaginal hysterectomy and BSO, XRT and chemo, CKD, prior  SBO, HLD, DM, HTN.    PT Comments    Pt received sitting in the chair and agreeable to session. Pt making good progress towards functional mobility goals. Pt able to tolerate increased gait distance and stair trial with min guard for safety. Pt continues to demonstrate some unsteadiness, but no overt LOB. Pt reporting that she has a RW at home and discussed using the RW while daughter is not home for increased stability and to reduce the risk of falling due to unsteadiness. Education provided on activity progression for increased strength and balance. Anticipate pt and family will be able to manage pt's mobility needs at home.    Recommendations for follow up therapy are one component of a multi-disciplinary discharge planning process, led by the attending physician.  Recommendations may be updated based on patient status, additional functional criteria and insurance authorization.     Assistance Recommended at Discharge Intermittent Supervision/Assistance  Patient can return home with the following Assistance with cooking/housework;Help with stairs or ramp for entrance;A little help with bathing/dressing/bathroom   Equipment Recommendations  None recommended by PT    Recommendations for Other Services       Precautions / Restrictions Precautions Precautions: Fall Precaution Comments: fell down stairs last August with no bony injury Restrictions Weight Bearing Restrictions: No     Mobility  Bed Mobility Overal bed mobility: Modified Independent                  Transfers Overall transfer level: Modified independent                  General transfer comment: From EOB and chair with BUE support for power up.    Ambulation/Gait Ambulation/Gait assistance: Min guard Gait Distance (Feet): 300 Feet Assistive device: None Gait Pattern/deviations: Step-through pattern, Decreased stride length, Drifts right/left, Wide base of support       General Gait Details: Pt demonstrating low foot clearance and some unsteadiness, but no overt LOB or UE support needed. Close min guard for safety   Stairs Stairs: Yes Stairs assistance: Min guard Stair Management: One rail Right Number of Stairs: 12 General stair comments: Cues for technique and pt demonstrating steady pace. L HHA to simulate home environment.       Balance Overall balance assessment: Needs assistance   Sitting balance-Leahy Scale: Good Sitting balance - Comments: sitting EOB   Standing balance support: During functional activity Standing balance-Leahy Scale: Fair Standing balance comment: without AD and min guard                            Cognition Arousal/Alertness: Awake/alert Behavior During Therapy: WFL for tasks assessed/performed Overall Cognitive Status: Within Functional Limits for tasks assessed                                          Exercises      General Comments General comments (skin integrity, edema, etc.): Education on activity progression and use of RW at home for safety when daughter is not home due  to slight unsteadiness.      Pertinent Vitals/Pain Pain Assessment Pain Assessment: No/denies pain     PT Goals (current goals can now be found in the care plan section) Acute Rehab PT Goals Patient Stated Goal: to return home possibly tomorrow, get better balance PT Goal Formulation: With patient/family Time For Goal Achievement: 10/10/22 Potential to Achieve Goals: Good Progress towards PT goals: Progressing toward goals    Frequency    Min 3X/week      PT Plan Current plan  remains appropriate       AM-PAC PT "6 Clicks" Mobility   Outcome Measure  Help needed turning from your back to your side while in a flat bed without using bedrails?: None Help needed moving from lying on your back to sitting on the side of a flat bed without using bedrails?: None Help needed moving to and from a bed to a chair (including a wheelchair)?: A Little Help needed standing up from a chair using your arms (e.g., wheelchair or bedside chair)?: None Help needed to walk in hospital room?: A Little Help needed climbing 3-5 steps with a railing? : A Little 6 Click Score: 21    End of Session   Activity Tolerance: Patient tolerated treatment well Patient left: in bed;with call bell/phone within reach;with family/visitor present Nurse Communication: Mobility status PT Visit Diagnosis: Other abnormalities of gait and mobility (R26.89);History of falling (Z91.81);Muscle weakness (generalized) (M62.81)     Time: 5409-8119 PT Time Calculation (min) (ACUTE ONLY): 15 min  Charges:  $Gait Training: 8-22 mins                     Johny Shock, PTA Acute Rehabilitation Services Secure Chat Preferred  [phone]    Johny Shock 09/27/2022, 2:35 PM

## 2022-09-27 NOTE — Progress Notes (Signed)
Progress Note     Subjective: Tolerating FLD and having bowel function. Bloating and soreness continue to improve. Denies nausea or vomiting. Her daughter is at bedside as well this AM.   Objective: Vital signs in last 24 hours: Temp:  [97.5 F (36.4 C)-97.9 F (36.6 C)] 97.9 F (36.6 C) (06/04 0744) Pulse Rate:  [60-69] 65 (06/04 0744) Resp:  [16-19] 16 (06/04 0744) BP: (143-164)/(57-127) 143/127 (06/04 0744) SpO2:  [96 %-99 %] 99 % (06/04 0744) Weight:  [16 kg] 67 kg (06/04 0500) Last BM Date : 09/26/22  Intake/Output from previous day: 06/03 0701 - 06/04 0700 In: 2240.8 [P.O.:960; I.V.:1280.8] Out: -  Intake/Output this shift: Total I/O In: 255.8 [I.V.:255.8] Out: -   PE: General: pleasant, WD, WN female who is laying in bed in NAD Lungs: Respiratory effort nonlabored Abd: soft, NT, ND, +BS, no masses, hernias, or organomegaly MS: all 4 extremities are symmetrical with no cyanosis, clubbing, or edema. Psych: A&Ox3 with an appropriate affect.    Lab Results:  Recent Labs    09/24/22 1917 09/25/22 0100  WBC 11.4* 11.5*  HGB 13.3 12.0  HCT 40.5 37.4  PLT 356 327    BMET Recent Labs    09/26/22 0033 09/27/22 0111  NA 140 141  K 3.6 3.6  CL 105 105  CO2 25 28  GLUCOSE 117* 111*  BUN 13 11  CREATININE 0.83 0.83  CALCIUM 8.2* 8.4*    PT/INR Recent Labs    09/25/22 0126  LABPROT 14.5  INR 1.1    CMP     Component Value Date/Time   NA 141 09/27/2022 0111   K 3.6 09/27/2022 0111   CL 105 09/27/2022 0111   CO2 28 09/27/2022 0111   GLUCOSE 111 (H) 09/27/2022 0111   BUN 11 09/27/2022 0111   CREATININE 0.83 09/27/2022 0111   CREATININE 1.08 (H) 11/22/2021 1042   CALCIUM 8.4 (L) 09/27/2022 0111   PROT 5.3 (L) 09/25/2022 0100   ALBUMIN 2.9 (L) 09/25/2022 0100   AST 18 09/25/2022 0100   AST 20 11/22/2021 1042   ALT 21 09/25/2022 0100   ALT 20 11/22/2021 1042   ALKPHOS 42 09/25/2022 0100   BILITOT 0.4 09/25/2022 0100   BILITOT 0.3  11/22/2021 1042   GFRNONAA >60 09/27/2022 0111   GFRNONAA 52 (L) 11/22/2021 1042   GFRAA >60 03/28/2019 0838   Lipase     Component Value Date/Time   LIPASE 40 09/24/2022 1917       Studies/Results: DG Abd Portable 1V-Small Bowel Obstruction Protocol-initial, 8 hr delay  Result Date: 09/25/2022 CLINICAL DATA:  8 hour delayed film EXAM: PORTABLE ABDOMEN - 1 VIEW COMPARISON:  CT abdomen and pelvis 09/24/2022 FINDINGS: Contrast material is demonstrated throughout the colon to the rectum. Changes suggest no evidence of complete obstruction. Scattered gas-filled small bowel. No small or large bowel distention. No radiopaque stones. Degenerative changes in the lumbar spine with lumbar scoliosis convex towards the left. IMPRESSION: Contrast material demonstrated throughout the colon to the rectum suggesting no evidence of complete obstruction. Electronically Signed   By: Burman Nieves M.D.   On: 09/25/2022 19:23    Anti-infectives: Anti-infectives (From admission, onward)    None        Assessment/Plan  SBO - SBO protocol done with PO contrast and 8h delay showed contrast to level of rectum - advance to soft diet this AM - updated her daughter at bedside  - no indication for urgent/emergent surgical intervention  -  stable for DC later today from surgery perspective if tolerating diet this AM  FEN: soft diet, IVF per TR[facility] VTE: LMWH ID: no current abx   LOS: 3 days   I reviewed hospitalist notes, last 24 h vitals and pain scores, last 48 h intake and output, and last 24 h labs and trends.   Juliet Rude, [facility] 09/27/2022, 8:48 AM Please see Amion for pager number during day hours 7:00am-4:30pm

## 2022-09-27 NOTE — Plan of Care (Signed)

## 2022-09-30 NOTE — Therapy (Signed)
OUTPATIENT PHYSICAL THERAPY LOWER EXTREMITY EVALUATION   Patient Name: [name] MRN: [id number] DOB:1941/02/05, 82 y.o., female Today's Date: 09/30/2022  END OF SESSION:   Past Medical History:  Diagnosis Date   Allergy    Anxiety    takes ativan as needed   Arthritis    Benign positional vertigo    Bursitis of shoulder, left    Cancer (HCC)    Endometrial ca/Recurrence   CKD (chronic kidney disease), stage III (HCC)    Depression    Diabetes (HCC)    History of colonic polyps    Hypercholesteremia    Hypercholesterolemia    Hypertension    Lumbar pain    Osteoarthritis    Osteoporosis    S/P radiation therapy July 29, 2010-Sep 06, 2010   External beam of pelvis   S/P radiation therapy 09/15/10, 09/24/10, 09/28/2010   Intracavitary brachytherapy   SBO (small bowel obstruction) (HCC)    Status post chemotherapy    carboplatin/paclitaxel x 6 rounds   Past Surgical History:  Procedure Laterality Date   ABDOMINAL HYSTERECTOMY     BREAST LUMPECTOMY  1993   Left breast, benign   ROBOTIC ASSISTED LAPAROSCOPIC VAGINAL HYSTERECTOMY WITH FIBROID REMOVAL  July 2010   bilat. salpingo-oophorectomy   Patient Active Problem List   Diagnosis Date Noted   Abdominal pain 09/25/2022   Dehydration 09/25/2022   Acute prerenal azotemia 09/25/2022   Leukocytosis 09/25/2022   DM2 (diabetes mellitus, type 2) (HCC) 09/25/2022   HLD (hyperlipidemia) 09/25/2022   GAD (generalized anxiety disorder) 09/25/2022   GERD (gastroesophageal reflux disease) 09/25/2022   IDA (iron deficiency anemia) 11/22/2021   Osteoporosis 08/30/2021   Acute pain of right shoulder 09/30/2019   Myofascial pain 09/18/2019   Cervical radiculopathy 09/12/2019   Essential hypertension 05/18/2013   SBO (small bowel obstruction) (HCC) 05/17/2013   Small bowel obstruction (HCC) 05/16/2013   Malignant neoplasm of corpus uteri, except isthmus (HCC) 05/16/2011    PCP: Gaspar Garbe, MD   REFERRING  PROVIDER: Elease Etienne, MD   REFERRING DIAG: [phone] (ICD-10-CM) - SBO (small bowel obstruction) (HCC)   THERAPY DIAG:  No diagnosis found.  Rationale for Evaluation and Treatment: Rehabilitation  ONSET DATE: Hospital Admission 09/24/2022-09/27/2022  SUBJECTIVE:   SUBJECTIVE STATEMENT: ***  PERTINENT HISTORY: Patient is an 82 y/o female admitted 09/24/22 with abdominal pain due to SBO. PMH positive for endometrial cancer s/p robotic laparoscopic vaginal hysterectomy and BSO, XRT and chemo, CKD, prior SBO, HLD, DM, HTN.  From Acute Care PT" Patient presents with decreased mobility due to generalized weakness, decreased balance, history of fall on stairs at home last August, decreased activity tolerance and decreased sensation. She was previously independent, though sedentary, but working from home living with her daughter who works during the day. Currently minguard for hallway ambulation without device noting some veering and imbalance and decreased ankle DF with some "head fullness" per pt from change in weather takes Meclizine at home daily. Given limited sensory systems for balance and new weakness from being NPO and decreased strength and sensation in her feet feel she will benefit from follow up outpatient PT for balance."  PAIN:  Are you having pain? {OPRCPAIN:27236}  PRECAUTIONS: Fall  WEIGHT BEARING RESTRICTIONS: No  FALLS:  Has patient fallen in last 6 months?  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   PATIENT GOALS:  ***  NEXT MD VISIT: ***  OBJECTIVE:   DIAGNOSTIC FINDINGS: NA  PATIENT SURVEYS:  {rehab surveys:24030}  COGNITION: Overall cognitive status: {cognition:24006}     SENSATION: {sensation:27233}  EDEMA:  {edema:24020}  MUSCLE LENGTH: Hamstrings: Right *** deg; Left *** deg Thomas test: Right *** deg; Left *** deg  POSTURE: {posture:25561}  PALPATION: ***  LOWER EXTREMITY ROM:  {AROM/PROM:27142} ROM Right eval Left eval  Hip flexion    Hip extension    Hip abduction    Hip adduction    Hip internal rotation    Hip external rotation    Knee flexion    Knee extension    Ankle dorsiflexion    Ankle plantarflexion    Ankle inversion    Ankle eversion     (Blank rows = not tested)  LOWER EXTREMITY MMT:  MMT Right eval Left eval  Hip flexion    Hip extension    Hip abduction    Hip adduction    Hip internal rotation    Hip external rotation    Knee flexion    Knee extension    Ankle dorsiflexion    Ankle plantarflexion    Ankle inversion    Ankle eversion     (Blank rows = not tested)  LOWER EXTREMITY SPECIAL TESTS:  {LEspecialtests:26242}  FUNCTIONAL TESTS:  {Functional tests:24029}  GAIT: Distance walked: *** Assistive device utilized: {Assistive devices:23999} Level of assistance: {Levels of assistance:24026} Comments: ***   TODAY'S TREATMENT:                                                                                                                              DATE: ***    PATIENT EDUCATION:  Education details: *** Person educated: {Person educated:25204} Education method: {Education Method:25205} Education comprehension: {Education Comprehension:25206}  HOME EXERCISE PROGRAM: ***  ASSESSMENT:  CLINICAL IMPRESSION: Moukthika Griffieth is an 82 y.o. female who was seen today for physical therapy evaluation and treatment for balance following functional decline following recent hospitalization for small bowel obstruction.   Patient presents with physical impairments of impaired activity tolerance, impaired standing balance, impaired ambulation, and decreased safety awareness impacting safe and independent functional mobility. Examination revealed patient is at risk for falls and functional decline as evidenced by the following objective test measures: Gait speed *** m/sec, (23m/sec is needed for community access), mCTSIB: position 1: **sec, position 2: ***sec, position 3: ***sec, position 4: ***sec (30sec in each position demonstrates equal weighting of balance systems), TUG of ***sec (>12sec indicates increased risk for falls), and 5x sit to stand of ***sec (>15sec indicates increased risk for falls and decreased BLE power). Patient will benefit from skilled  physical therapy services to help reach the maximal level of functional independence and mobility.  Bora Portillo  demonstrates understanding of this plan of care and is in agreement with this plan.    OBJECTIVE IMPAIRMENTS: {opptimpairments:25111}.   ACTIVITY LIMITATIONS: {activitylimitations:27494}  PARTICIPATION LIMITATIONS: {participationrestrictions:25113}  PERSONAL FACTORS: Age and 3+ comorbidities: history endometrial cancer s/p hysterectomy, CKD,  SBO, HLD, DM, HTN  are also affecting patient's functional outcome.   REHAB POTENTIAL: Good  CLINICAL DECISION MAKING: Evolving/moderate complexity  EVALUATION COMPLEXITY: Moderate   GOALS: Goals reviewed with patient? {yes/no:20286}  SHORT TERM GOALS: Target date: {follow up:25551}   Patient will be independent with initial HEP. Baseline: *** Goal status: {GOALSTATUS:25110}  2.  Patient will demonstrate decreased fall risk by scoring < 25 sec on TUG. Baseline: *** Goal status: {GOALSTATUS:25110}  3.  Patient will be educated on strategies to decrease risk of falls.  Baseline: *** Goal status: {GOALSTATUS:25110}   LONG TERM GOALS: Target date: {follow up:25551}   Patient will be independent  with advanced/ongoing HEP to improve outcomes and carryover.  Baseline: *** Goal status: {GOALSTATUS:25110}  2.  Patient will be able to ambulate 600' with LRAD with good safety to access community.  Baseline: *** Goal status: {GOALSTATUS:25110}  3.  Patient will be able to step up/down curb safely with LRAD for safety with community ambulation.  Baseline: *** Goal status: {GOALSTATUS:25110}   4.  Patient will demonstrate improved functional LE strength as demonstrated by ***. Baseline: *** Goal status: {GOALSTATUS:25110}  5.  Patient will demonstrate at least 19/24 on DGI to improve gait stability and reduce risk for falls. Baseline: *** Goal status: {GOALSTATUS:25110}  6.  Patient will score *** on Berg Balance test to demonstrate lower risk of falls. (MCID= 8 points) .  Baseline: *** Goal status: {GOALSTATUS:25110}  7.  Patient will report *** on *** (patient reported outcome measure) to demonstrate improved functional ability. Baseline: *** Goal status: {GOALSTATUS:25110}  8.  Patient will demonstrate gait speed of >/= 1.8 ft/sec (0.55 m/s) to be a safe limited community ambulator with decreased risk for recurrent falls.  Baseline: *** Goal status: {GOALSTATUS:25110}  PLAN:  PT FREQUENCY: {rehab frequency:25116}  PT DURATION: {rehab duration:25117}  PLANNED INTERVENTIONS: Therapeutic exercises, Therapeutic activity, Neuromuscular re-education, Balance training, Gait training, Patient/Family education, Self Care, Joint mobilization, Stair training, Prosthetic training, Aquatic Therapy, Cryotherapy, Moist heat, Manual therapy, and Re-evaluation  PLAN FOR NEXT SESSION: ***   Jena Gauss, PT, DPT  09/30/2022, 9:17 AM

## 2022-10-03 ENCOUNTER — Ambulatory Visit: Payer: Medicare Other | Attending: Internal Medicine | Admitting: Physical Therapy

## 2022-10-03 ENCOUNTER — Encounter: Payer: Self-pay | Admitting: Physical Therapy

## 2022-10-03 DIAGNOSIS — R2681 Unsteadiness on feet: Secondary | ICD-10-CM | POA: Insufficient documentation

## 2022-10-03 DIAGNOSIS — K56609 Unspecified intestinal obstruction, unspecified as to partial versus complete obstruction: Secondary | ICD-10-CM | POA: Insufficient documentation

## 2022-10-03 DIAGNOSIS — M6281 Muscle weakness (generalized): Secondary | ICD-10-CM | POA: Diagnosis not present

## 2022-10-04 DIAGNOSIS — E7800 Pure hypercholesterolemia, unspecified: Secondary | ICD-10-CM | POA: Diagnosis not present

## 2022-10-04 DIAGNOSIS — K56609 Unspecified intestinal obstruction, unspecified as to partial versus complete obstruction: Secondary | ICD-10-CM | POA: Diagnosis not present

## 2022-10-04 DIAGNOSIS — I129 Hypertensive chronic kidney disease with stage 1 through stage 4 chronic kidney disease, or unspecified chronic kidney disease: Secondary | ICD-10-CM | POA: Diagnosis not present

## 2022-10-04 DIAGNOSIS — K219 Gastro-esophageal reflux disease without esophagitis: Secondary | ICD-10-CM | POA: Diagnosis not present

## 2022-10-04 DIAGNOSIS — E1129 Type 2 diabetes mellitus with other diabetic kidney complication: Secondary | ICD-10-CM | POA: Diagnosis not present

## 2022-10-04 DIAGNOSIS — F418 Other specified anxiety disorders: Secondary | ICD-10-CM | POA: Diagnosis not present

## 2022-10-04 DIAGNOSIS — D72829 Elevated white blood cell count, unspecified: Secondary | ICD-10-CM | POA: Diagnosis not present

## 2022-10-04 DIAGNOSIS — E860 Dehydration: Secondary | ICD-10-CM | POA: Diagnosis not present

## 2022-10-06 ENCOUNTER — Ambulatory Visit: Payer: Medicare Other

## 2022-10-06 DIAGNOSIS — K56609 Unspecified intestinal obstruction, unspecified as to partial versus complete obstruction: Secondary | ICD-10-CM | POA: Diagnosis not present

## 2022-10-06 DIAGNOSIS — R2681 Unsteadiness on feet: Secondary | ICD-10-CM | POA: Diagnosis not present

## 2022-10-06 DIAGNOSIS — M6281 Muscle weakness (generalized): Secondary | ICD-10-CM

## 2022-10-06 NOTE — Therapy (Signed)
OUTPATIENT PHYSICAL THERAPY LOWER EXTREMITY EVALUATION   Patient Name: [name] MRN: [id number] DOB:1940-12-08, 82 y.o., female Today's Date: 10/06/2022  END OF SESSION:  PT End of Session - 10/06/22 1026     Visit Number 2    Number of Visits 16    Date for PT Re-Evaluation 11/28/22    Authorization Type Medicare + [employer]    PT Start Time 1018    PT Stop Time 1058    PT Time Calculation (min) 40 min    Activity Tolerance Patient tolerated treatment well    Behavior During Therapy WFL for tasks assessed/performed              Past Medical History:  Diagnosis Date   Allergy    Anxiety    takes ativan as needed   Arthritis    Benign positional vertigo    Bursitis of shoulder, left    Cancer (HCC)    Endometrial ca/Recurrence   CKD (chronic kidney disease), stage III (HCC)    Depression    Diabetes (HCC)    History of colonic polyps    Hypercholesteremia    Hypercholesterolemia    Hypertension    Lumbar pain    Osteoarthritis    Osteoporosis    S/P radiation therapy July 29, 2010-Sep 06, 2010   External beam of pelvis   S/P radiation therapy 09/15/10, 09/24/10, 09/28/2010   Intracavitary brachytherapy   SBO (small bowel obstruction) (HCC)    Status post chemotherapy    carboplatin/paclitaxel x 6 rounds   Past Surgical History:  Procedure Laterality Date   ABDOMINAL HYSTERECTOMY     BREAST LUMPECTOMY  1993   Left breast, benign   ROBOTIC ASSISTED LAPAROSCOPIC VAGINAL HYSTERECTOMY WITH FIBROID REMOVAL  July 2010   bilat. salpingo-oophorectomy   Patient Active Problem List   Diagnosis Date Noted   Abdominal pain 09/25/2022   Dehydration 09/25/2022   Acute prerenal azotemia 09/25/2022   Leukocytosis 09/25/2022   DM2 (diabetes mellitus, type 2) (HCC) 09/25/2022   HLD (hyperlipidemia) 09/25/2022   GAD (generalized anxiety disorder) 09/25/2022   GERD (gastroesophageal reflux disease) 09/25/2022   IDA (iron deficiency anemia) 11/22/2021    Osteoporosis 08/30/2021   Acute pain of right shoulder 09/30/2019   Myofascial pain 09/18/2019   Cervical radiculopathy 09/12/2019   Essential hypertension 05/18/2013   SBO (small bowel obstruction) (HCC) 05/17/2013   Small bowel obstruction (HCC) 05/16/2013   Malignant neoplasm of corpus uteri, except isthmus (HCC) 05/16/2011    PCP: Gaspar Garbe, MD   REFERRING PROVIDER: Elease Etienne, MD   REFERRING DIAG: [phone] (ICD-10-CM) - SBO (small bowel obstruction) (HCC)   THERAPY DIAG:  Muscle weakness (generalized)  Unsteadiness on feet  Rationale for Evaluation and Treatment: Rehabilitation  ONSET DATE: Hospital Admission 09/24/2022-09/27/2022  SUBJECTIVE:   SUBJECTIVE STATEMENT: Pt reports no recent falls. A little aches and pains here and there  PERTINENT HISTORY: Patient is an 82 y/o female admitted 09/24/22 with abdominal pain due to SBO. PMH positive for endometrial cancer s/p robotic laparoscopic vaginal hysterectomy and BSO, XRT and chemo, CKD, prior SBO, HLD, DM, HTN.  From Acute Care PT" Patient presents with decreased mobility due to generalized weakness, decreased balance, history of fall on stairs at home last August, decreased activity tolerance and decreased sensation. She was previously independent, though sedentary, but working from home living with her daughter who works during the day. Currently minguard for hallway ambulation without device noting some veering and imbalance and  decreased ankle DF with some "head fullness" per pt from change in weather takes Meclizine at home daily. Given limited sensory systems for balance and new weakness from being NPO and decreased strength and sensation in her feet feel she will benefit from follow up outpatient PT for balance."  PAIN:  Are you having pain? No has history of neck pain but not concerned with it, able to manage it.   PRECAUTIONS: Fall  WEIGHT BEARING RESTRICTIONS: No  FALLS:  Has patient fallen in last 6  months?  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   PATIENT GOALS: get stronger  NEXT MD VISIT: 10/04/2022  OBJECTIVE:   DIAGNOSTIC FINDINGS: NA  PATIENT SURVEYS:  ABC scale 1130/1600 = 70.6%  COGNITION: Overall cognitive status: Within functional limits for tasks assessed     SENSATION: Slightly decreased sensation bottoms of feet.   POSTURE: rounded shoulders and forward head  LOWER EXTREMITY MMT:  MMT Right eval Left eval  Hip flexion 4 4  Hip extension 4 4  Hip abduction 4 4  Hip adduction 4+ 4+  Hip internal rotation    Hip external rotation    Knee flexion 5 5  Knee extension 4 4  Ankle dorsiflexion 5 5  Ankle plantarflexion 5 5  Ankle inversion    Ankle eversion     (Blank rows = not tested)   FUNCTIONAL TESTS:  5 times sit to stand: 29 seconds 6 minute walk test: TBD Berg Balance Scale: 49/56 Dynamic Gait Index: TBD MCTSIB: Condition 1: Avg of 3 trials: 30 sec, Condition 2: Avg of 3 trials: 30 sec, Condition 3: Avg of 3 trials: 30 sec, Condition 4: Avg of 3 trials: 30 sec, and Total Score: 120/120 increased sway on condition 4.   GAIT: Distance walked: 64' Assistive device utilized: None Level of assistance: Complete Independence Comments: visually slow gait speed.    TODAY'S TREATMENT:                                                                                                                              DATE:   10/06/22 Therapeutic Exercise: to improve strength and mobility.  Demo, verbal and  tactile cues throughout for technique.  Nustep L4x3min Standing head turns 5x Standing chin tucks 5x Standing back extension 5x Standing trunk rotation 5x Standing hamstring curls x 10 bil Standing hip abduction x 10 bil Standing heel raises and toe raises x 20 bil Seated LAQ x 10 R/L  10/03/22 Self Care: Findings, importance of regular physical exercise,  affects of sedentary lifestyle and hospitalization on LE strength, information [facility].  PATIENT EDUCATION:  Education details: see self care.  Person educated: Patient Education method: Explanation Education comprehension: verbalized understanding  HOME EXERCISE PROGRAM: OTAGO program  ASSESSMENT:  CLINICAL IMPRESSION: We reviewed the OTAGO program today, started with the mobility exercises and strengthening, we did not get to the balance section. Cues were needed with HS curls to avoid marching motion and for more hip extension. Otherwise she showed a good tolerance for exercises. She will be out of town next week on vacation.   OBJECTIVE IMPAIRMENTS: decreased activity tolerance, decreased balance, decreased endurance, decreased mobility, and decreased strength.   ACTIVITY LIMITATIONS: locomotion level  PARTICIPATION LIMITATIONS: community activity  PERSONAL FACTORS: Age and 3+ comorbidities: history endometrial cancer s/p hysterectomy, CKD,  SBO, HLD, DM, HTN  are also affecting patient's functional outcome.   REHAB POTENTIAL: Good  CLINICAL DECISION MAKING: Evolving/moderate complexity  EVALUATION COMPLEXITY: Moderate   GOALS: Goals reviewed with patient? Yes  SHORT TERM GOALS: Target date: 10/17/2022   Patient will be independent with initial HEP. Baseline:  Goal status: IN PROGRESS  LONG TERM GOALS: Target date: 11/28/2022   Patient will be independent with advanced/ongoing HEP to improve outcomes and carryover.  Baseline:  Goal status: IN PROGRESS  2.  Patient will be  able to ambulate 900' in 6 minutes to demonstrate ability to ambulate in community.   Baseline: NT Goal status: IN PROGRESS  3.  Patient will demonstrate improved functional LE strength as demonstrated by 5x STS < 20 seconds. Baseline: 29 seconds Goal status: IN PROGRESS  4.  Patient will demonstrate at least 19/24 on DGI to improve gait stability and reduce risk for falls. Baseline: NT Goal status: IN PROGRESS  5.  Patient will report 19 points improvement on ABC scale to demonstrate improved functional ability. Baseline: 1130/1600 Goal status: IN PROGRESS  6.  Patient be able to maintain tandem stance x 30 sec bil to demonstrate decreased risk of falls.  Baseline: 15 seconds bil  Goal status: IN PROGRESS  PLAN:  PT FREQUENCY: 1-2x/week  PT DURATION: 8 weeks  PLANNED INTERVENTIONS: Therapeutic exercises, Therapeutic activity, Neuromuscular re-education, Balance training, Gait training, Patient/Family education, Self Care, Joint mobilization, Stair training, Prosthetic training, Aquatic Therapy, Cryotherapy, Moist heat, Manual therapy, and Re-evaluation  PLAN FOR NEXT SESSION: start OTAGO program for HEP   Makylah Bossard L Perl Folmar, PTA 10/06/2022, 11:09 AM

## 2022-10-18 ENCOUNTER — Encounter: Payer: Self-pay | Admitting: Physical Therapy

## 2022-10-18 ENCOUNTER — Ambulatory Visit: Payer: Medicare Other | Admitting: Physical Therapy

## 2022-10-18 DIAGNOSIS — R2681 Unsteadiness on feet: Secondary | ICD-10-CM | POA: Diagnosis not present

## 2022-10-18 DIAGNOSIS — M6281 Muscle weakness (generalized): Secondary | ICD-10-CM

## 2022-10-18 DIAGNOSIS — K56609 Unspecified intestinal obstruction, unspecified as to partial versus complete obstruction: Secondary | ICD-10-CM | POA: Diagnosis not present

## 2022-10-18 NOTE — Therapy (Signed)
OUTPATIENT PHYSICAL THERAPY LOWER EXTREMITY EVALUATION   Patient Name: [name] MRN: [id number] DOB:[birth year], 82 y.o., female Today's Date: 10/18/2022  END OF SESSION:  PT End of Session - 10/18/22 1317     Visit Number 3    Number of Visits 16    Date for PT Re-Evaluation 11/28/22    Authorization Type Medicare + [employer]    PT Start Time 1317    PT Stop Time 1400    PT Time Calculation (min) 43 min    Activity Tolerance Patient tolerated treatment well    Behavior During Therapy [facility] for tasks assessed/performed              Past Medical History:  Diagnosis Date   Allergy    Anxiety    takes ativan as needed   Arthritis    Benign positional vertigo    Bursitis of shoulder, left    Cancer (HCC)    Endometrial ca/Recurrence   CKD (chronic kidney disease), stage III (HCC)    Depression    Diabetes (HCC)    History of colonic polyps    Hypercholesteremia    Hypercholesterolemia    Hypertension    Lumbar pain    Osteoarthritis    Osteoporosis    S/P radiation therapy July 29, 2010-Sep 06, 2010   External beam of pelvis   S/P radiation therapy 09/15/10, 09/24/10, 09/28/2010   Intracavitary brachytherapy   SBO (small bowel obstruction) (HCC)    Status post chemotherapy    carboplatin/paclitaxel x 6 rounds   Past Surgical History:  Procedure Laterality Date   ABDOMINAL HYSTERECTOMY     BREAST LUMPECTOMY  1993   Left breast, benign   ROBOTIC ASSISTED LAPAROSCOPIC VAGINAL HYSTERECTOMY WITH FIBROID REMOVAL  July 2010   bilat. salpingo-oophorectomy   Patient Active Problem List   Diagnosis Date Noted   Abdominal pain 09/25/2022   Dehydration 09/25/2022   Acute prerenal azotemia 09/25/2022   Leukocytosis 09/25/2022   DM2 (diabetes mellitus, type 2) (HCC) 09/25/2022   HLD (hyperlipidemia) 09/25/2022   GAD (generalized anxiety disorder) 09/25/2022   GERD (gastroesophageal reflux disease) 09/25/2022   IDA (iron deficiency anemia) 11/22/2021    Osteoporosis 08/30/2021   Acute pain of right shoulder 09/30/2019   Myofascial pain 09/18/2019   Cervical radiculopathy 09/12/2019   Essential hypertension 05/18/2013   SBO (small bowel obstruction) (HCC) 05/17/2013   Small bowel obstruction (HCC) 05/16/2013   Malignant neoplasm of corpus uteri, except isthmus (HCC) 05/16/2011    PCP: Gaspar Garbe, MD   REFERRING PROVIDER: Elease Etienne, MD   REFERRING DIAG: [phone] (ICD-10-CM) - SBO (small bowel obstruction) (HCC)   THERAPY DIAG:  Muscle weakness (generalized)  Unsteadiness on feet  Rationale for Evaluation and Treatment: Rehabilitation  ONSET DATE: Hospital Admission 09/24/2022-09/27/2022  SUBJECTIVE:   SUBJECTIVE STATEMENT: Pt reports no recent falls. A little aches and pains here and there  PERTINENT HISTORY: Patient is an 82 y/o female admitted 09/24/22 with abdominal pain due to SBO. PMH positive for endometrial cancer s/p robotic laparoscopic vaginal hysterectomy and BSO, XRT and chemo, CKD, prior SBO, HLD, DM, HTN.  From Acute Care PT" Patient presents with decreased mobility due to generalized weakness, decreased balance, history of fall on stairs at home last August, decreased activity tolerance and decreased sensation. She was previously independent, though sedentary, but working from home living with her daughter who works during the day. Currently minguard for hallway ambulation without device noting some veering and imbalance and  decreased ankle DF with some "head fullness" per pt from change in weather takes Meclizine at home daily. Given limited sensory systems for balance and new weakness from being NPO and decreased strength and sensation in her feet feel she will benefit from follow up outpatient PT for balance."  PAIN:  Are you having pain? Yes: NPRS scale: 4/10 Pain location: low back   has history of neck pain but not concerned with it, able to manage it.   PRECAUTIONS: Fall  WEIGHT BEARING  RESTRICTIONS: No  FALLS:  Has patient fallen in last 6 months?  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   PATIENT GOALS: get stronger  NEXT MD VISIT: 10/04/2022  OBJECTIVE:   DIAGNOSTIC FINDINGS: NA  PATIENT SURVEYS:  ABC scale 1130/1600 = 70.6%  COGNITION: Overall cognitive status: Within functional limits for tasks assessed     SENSATION: Slightly decreased sensation bottoms of feet.   POSTURE: rounded shoulders and forward head  LOWER EXTREMITY MMT:  MMT Right eval Left eval  Hip flexion 4 4  Hip extension 4 4  Hip abduction 4 4  Hip adduction 4+ 4+  Hip internal rotation    Hip external rotation    Knee flexion 5 5  Knee extension 4 4  Ankle dorsiflexion 5 5  Ankle plantarflexion 5 5  Ankle inversion    Ankle eversion     (Blank rows = not tested)   FUNCTIONAL TESTS:  5 times sit to stand: 29 seconds 6 minute walk test: 600' Berg Balance Scale: 49/56 Dynamic Gait Index: TBD MCTSIB: Condition 1: Avg of 3 trials: 30 sec, Condition 2: Avg of 3 trials: 30 sec, Condition 3: Avg of 3 trials: 30 sec, Condition 4: Avg of 3 trials: 30 sec, and Total Score: 120/120 increased sway on condition 4.   GAIT: Distance walked: 53' Assistive device utilized: None Level of assistance: Complete Independence Comments: visually slow gait speed.    TODAY'S TREATMENT:                                                                                                                              DATE:    10/18/2022 Therapeutic  Exercise: to improve strength and mobility.  Demo, verbal and tactile cues throughout for technique. Gait x 600' for warm-up  Reviewed OTAGO program booklet to improve balance and safety including: Head movements (rotation x 5) Neck movements (chin tucks x 5) Back Extension x 5  Strengthening: Trunk movements (trunk rotation with hands on hips x 5 each side)  Ankle movements (seated ankle pumps x 10 each foot) Front knee strengthening (seated LAQ  x 10 with 2#ankle weights) Back knee strengthening (standing hamstring curls with 2# ankle weights, x 10 bil both hands on counter) Side hip strengthening (hip abduction with 2# ankle weights, x 10 bil, hands on counter) Calf raises x 20 with support 2# ankle weights Toe raises x 20 with support Balance: Knee bends with support x 10 Backwards walking with support 4 x 10 steps Walking and turning around (figure 8 pattern)  Sideways walking 2 x 10 steps each direction  Heel toe standing x 10 sec each side with support Heel toe walking 4 x 10 steps with support One leg stand with support x 10 sec each side Heel walking with support 4 x 10 steps  Toe walking with support 4 x 10 steps Heel toe backwards walking 4 x 10 steps Sit to stands without support x 10   10/06/22 Therapeutic Exercise: to improve strength and mobility.  Demo, verbal and tactile cues throughout for technique.  Nustep L4x45min Standing head turns 5x Standing chin tucks 5x Standing back extension 5x Standing trunk rotation 5x Standing hamstring curls x 10 bil Standing hip abduction x 10 bil Standing heel raises and toe raises x 20 bil Seated LAQ x 10 R/L  10/03/22 Self Care: Findings, importance of regular physical exercise, affects of sedentary lifestyle and hospitalization on LE strength, information on mall walking program at senior center.  PATIENT EDUCATION:  Education details: see self care.  Person educated: Patient Education method: Explanation Education  comprehension: verbalized understanding  HOME EXERCISE PROGRAM: OTAGO program  ASSESSMENT:  CLINICAL IMPRESSION: Finished reviewing OTAGO program, removing extra pages, focusing on Level C - with support, although noted with many of the balance exercises only needed finger tip support and then no support.  SBA throughout for safety but no LOB.  Recommended adjustable ankle weights.  Carrolyn Laris continues to demonstrate potential for improvement and would benefit from continued skilled therapy to address impairments.     OBJECTIVE IMPAIRMENTS: decreased activity tolerance, decreased balance, decreased endurance, decreased mobility, and decreased strength.   ACTIVITY LIMITATIONS: locomotion level  PARTICIPATION LIMITATIONS: community activity  PERSONAL FACTORS: Age and 3+ comorbidities: history endometrial cancer s/p hysterectomy, CKD,  SBO, HLD, DM, HTN  are also affecting patient's functional outcome.   REHAB POTENTIAL: Good  CLINICAL DECISION MAKING: Evolving/moderate complexity  EVALUATION COMPLEXITY: Moderate   GOALS: Goals reviewed with patient? Yes  SHORT TERM GOALS: Target date: 10/17/2022   Patient will be independent with initial HEP. Baseline:  Goal status: IN PROGRESS 10/18/22- was on vacation so only tried 1x.   LONG TERM GOALS: Target date: 11/28/2022   Patient will be independent with advanced/ongoing HEP to improve outcomes and carryover.  Baseline:  Goal status: IN PROGRESS  2.  Patient will be able to ambulate 900' in 6 minutes to demonstrate ability to ambulate in community.   Baseline: 600' Goal status: IN PROGRESS  3.  Patient will demonstrate improved functional LE strength as demonstrated by 5x STS < 20 seconds. Baseline: 29 seconds Goal status: IN PROGRESS  4.  Patient will demonstrate at least 19/24 on DGI to improve gait stability and reduce risk for falls. Baseline: NT Goal status: IN PROGRESS  5.  Patient will report 19 points improvement  on ABC scale to demonstrate improved functional ability. Baseline: 1130/1600 Goal status: IN PROGRESS  6.  Patient be able to maintain tandem stance x 30 sec bil to  demonstrate decreased risk of falls.  Baseline: 15 seconds bil  Goal status: IN PROGRESS  PLAN:  PT FREQUENCY: 1-2x/week  PT DURATION: 8 weeks  PLANNED INTERVENTIONS: Therapeutic exercises, Therapeutic activity, Neuromuscular re-education, Balance training, Gait training, Patient/Family education, Self Care, Joint mobilization, Stair training, Prosthetic training, Aquatic Therapy, Cryotherapy, Moist heat, Manual therapy, and Re-evaluation  PLAN FOR NEXT SESSION: do DGI,  continue balance and LE strengthening, has OTAGO program for HEP   Jena Gauss, PT, DPT 10/18/2022, 2:58 PM

## 2022-10-20 ENCOUNTER — Ambulatory Visit: Payer: Medicare Other

## 2022-10-20 DIAGNOSIS — R2681 Unsteadiness on feet: Secondary | ICD-10-CM | POA: Diagnosis not present

## 2022-10-20 DIAGNOSIS — K56609 Unspecified intestinal obstruction, unspecified as to partial versus complete obstruction: Secondary | ICD-10-CM | POA: Diagnosis not present

## 2022-10-20 DIAGNOSIS — M6281 Muscle weakness (generalized): Secondary | ICD-10-CM

## 2022-10-20 NOTE — Therapy (Signed)
OUTPATIENT PHYSICAL THERAPY LOWER EXTREMITY TREATMENT   Patient Name: [name] MRN: [id number] DOB:[birth year], 82 y.o., female Today's Date: 10/20/2022  END OF SESSION:  PT End of Session - 10/20/22 1323     Visit Number 4    Number of Visits 16    Date for PT Re-Evaluation 11/28/22    Authorization Type Medicare + [employer]    PT Start Time 1315    PT Stop Time 1400    PT Time Calculation (min) 45 min    Activity Tolerance Patient tolerated treatment well    Behavior During Therapy [facility] for tasks assessed/performed               Past Medical History:  Diagnosis Date   Allergy    Anxiety    takes ativan as needed   Arthritis    Benign positional vertigo    Bursitis of shoulder, left    Cancer (HCC)    Endometrial ca/Recurrence   CKD (chronic kidney disease), stage III (HCC)    Depression    Diabetes (HCC)    History of colonic polyps    Hypercholesteremia    Hypercholesterolemia    Hypertension    Lumbar pain    Osteoarthritis    Osteoporosis    S/P radiation therapy July 29, 2010-Sep 06, 2010   External beam of pelvis   S/P radiation therapy 09/15/10, 09/24/10, 09/28/2010   Intracavitary brachytherapy   SBO (small bowel obstruction) (HCC)    Status post chemotherapy    carboplatin/paclitaxel x 6 rounds   Past Surgical History:  Procedure Laterality Date   ABDOMINAL HYSTERECTOMY     BREAST LUMPECTOMY  1993   Left breast, benign   ROBOTIC ASSISTED LAPAROSCOPIC VAGINAL HYSTERECTOMY WITH FIBROID REMOVAL  July 2010   bilat. salpingo-oophorectomy   Patient Active Problem List   Diagnosis Date Noted   Abdominal pain 09/25/2022   Dehydration 09/25/2022   Acute prerenal azotemia 09/25/2022   Leukocytosis 09/25/2022   DM2 (diabetes mellitus, type 2) (HCC) 09/25/2022   HLD (hyperlipidemia) 09/25/2022   GAD (generalized anxiety disorder) 09/25/2022   GERD (gastroesophageal reflux disease) 09/25/2022   IDA (iron deficiency anemia) 11/22/2021    Osteoporosis 08/30/2021   Acute pain of right shoulder 09/30/2019   Myofascial pain 09/18/2019   Cervical radiculopathy 09/12/2019   Essential hypertension 05/18/2013   SBO (small bowel obstruction) (HCC) 05/17/2013   Small bowel obstruction (HCC) 05/16/2013   Malignant neoplasm of corpus uteri, except isthmus (HCC) 05/16/2011    PCP: Gaspar Garbe, MD   REFERRING PROVIDER: Elease Etienne, MD   REFERRING DIAG: [phone] (ICD-10-CM) - SBO (small bowel obstruction) (HCC)   THERAPY DIAG:  Muscle weakness (generalized)  Unsteadiness on feet  Rationale for Evaluation and Treatment: Rehabilitation  ONSET DATE: Hospital Admission 09/24/2022-09/27/2022  SUBJECTIVE:   SUBJECTIVE STATEMENT: Some low back pain, only noticing unsteadiness when standing too quickly.  PERTINENT HISTORY: Patient is an 82 y/o female admitted 09/24/22 with abdominal pain due to SBO. PMH positive for endometrial cancer s/p robotic laparoscopic vaginal hysterectomy and BSO, XRT and chemo, CKD, prior SBO, HLD, DM, HTN.  From Acute Care PT" Patient presents with decreased mobility due to generalized weakness, decreased balance, history of fall on stairs at home last August, decreased activity tolerance and decreased sensation. She was previously independent, though sedentary, but working from home living with her daughter who works during the day. Currently minguard for hallway ambulation without device noting some veering and imbalance and decreased  ankle DF with some "head fullness" per pt from change in weather takes Meclizine at home daily. Given limited sensory systems for balance and new weakness from being NPO and decreased strength and sensation in her feet feel she will benefit from follow up outpatient PT for balance."  PAIN:  Are you having pain? Yes: NPRS scale: 3/10 Pain location: low back   has history of neck pain but not concerned with it, able to manage it.   PRECAUTIONS: Fall  WEIGHT BEARING  RESTRICTIONS: No  FALLS:  Has patient fallen in last 6 months?  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   PATIENT GOALS: get stronger  NEXT MD VISIT: 10/04/2022  OBJECTIVE:   DIAGNOSTIC FINDINGS: NA  PATIENT SURVEYS:  ABC scale 1130/1600 = 70.6%  COGNITION: Overall cognitive status: Within functional limits for tasks assessed     SENSATION: Slightly decreased sensation bottoms of feet.   POSTURE: rounded shoulders and forward head  LOWER EXTREMITY MMT:  MMT Right eval Left eval  Hip flexion 4 4  Hip extension 4 4  Hip abduction 4 4  Hip adduction 4+ 4+  Hip internal rotation    Hip external rotation    Knee flexion 5 5  Knee extension 4 4  Ankle dorsiflexion 5 5  Ankle plantarflexion 5 5  Ankle inversion    Ankle eversion     (Blank rows = not tested)   FUNCTIONAL TESTS:  5 times sit to stand: 29 seconds 6 minute walk test: 600' Berg Balance Scale: 49/56 Dynamic Gait Index: TBD MCTSIB: Condition 1: Avg of 3 trials: 30 sec, Condition 2: Avg of 3 trials: 30 sec, Condition 3: Avg of 3 trials: 30 sec, Condition 4: Avg of 3 trials: 30 sec, and Total Score: 120/120 increased sway on condition 4.   GAIT: Distance walked: 71' Assistive device utilized: None Level of assistance: Complete Independence Comments: visually slow gait speed.    TODAY'S TREATMENT:                                                                                                                              DATE:   10/20/22 Therapeutic  Exercise: to improve strength and mobility.  Demo, verbal and tactile cues throughout for technique. Nustep L4x82min 18/24 on DGI Seated lumbar flexion stretch with orange pball 3x15 sec Seated scap retraction 10x3" Standing rows RTB x 10  Standing shoulder ext RTB x 10  Runner stretch x 30 for hip flexors bil Clock balance x 5 R/L postural sway bil  when posteriorly  Step ups with no UE support x 5 bil  10/18/2022 Therapeutic Exercise: to improve strength and mobility.  Demo, verbal and tactile cues throughout for technique. Gait x 600' for warm-up  Reviewed OTAGO program booklet to improve balance and safety including: Head movements (rotation x 5) Neck movements (chin tucks x 5) Back Extension x 5  Strengthening: Trunk movements (trunk rotation with hands on hips x 5 each side) Ankle movements (seated ankle pumps x 10 each foot) Front knee strengthening (seated LAQ  x 10 with 2#ankle weights) Back knee strengthening (standing hamstring curls with 2# ankle weights, x 10 bil both hands on counter) Side hip strengthening (hip abduction with 2# ankle weights, x 10 bil, hands on counter) Calf raises x 20 with support 2# ankle weights Toe raises x 20 with support Balance: Knee bends with support x 10 Backwards walking with support 4 x 10 steps Walking and turning around (figure 8 pattern)  Sideways walking 2 x 10 steps each direction  Heel toe standing x 10 sec each side with support Heel toe walking 4 x 10 steps with support One leg stand with support x 10 sec each side Heel walking with support 4 x 10 steps  Toe walking with support 4 x 10 steps Heel toe backwards walking 4 x 10 steps Sit to stands without support x 10   10/06/22 Therapeutic Exercise: to improve strength and mobility.  Demo, verbal and tactile cues throughout for technique.  Nustep L4x60min Standing head turns 5x Standing chin tucks 5x Standing back extension 5x Standing trunk rotation 5x Standing hamstring  curls x 10 bil Standing hip abduction x 10 bil Standing heel raises and toe raises x 20 bil Seated LAQ x 10 R/L  10/03/22 Self Care: Findings, importance of regular physical exercise, affects of sedentary lifestyle and hospitalization on LE strength, information on mall walking program at senior center.  PATIENT EDUCATION:  Education details: see self care.  Person educated: Patient Education method: Explanation Education comprehension: verbalized understanding  HOME EXERCISE PROGRAM: OTAGO program  ASSESSMENT:  CLINICAL IMPRESSION: Completed DGI with patient showing 18/24 score. She is slumped over majority of the time, so we focused on some more postural strengthening exercises. Did work on [employer] with clock reaches, most difficulty with posterior reach. She was able to do step ups w/o UE support had LOB x1 at eccentric phase but able to self recover. Swan Erion continues to demonstrate potential for improvement and would benefit from continued skilled therapy to address impairments.     OBJECTIVE IMPAIRMENTS: decreased activity tolerance, decreased balance, decreased endurance, decreased mobility, and decreased strength.   ACTIVITY LIMITATIONS: locomotion level  PARTICIPATION LIMITATIONS: community activity  PERSONAL FACTORS: Age and 3+ comorbidities: history endometrial cancer s/p hysterectomy, CKD,  SBO, HLD, DM, HTN  are also affecting patient's functional outcome.   REHAB POTENTIAL: Good  CLINICAL DECISION MAKING: Evolving/moderate complexity  EVALUATION COMPLEXITY: Moderate   GOALS: Goals reviewed with patient? Yes  SHORT TERM GOALS: Target date: 10/17/2022   Patient will be independent with initial HEP. Baseline:  Goal status: IN PROGRESS 10/18/22- was on vacation so only tried 1x.   LONG TERM GOALS: Target date: 11/28/2022   Patient will be independent with advanced/ongoing HEP to improve outcomes and carryover.  Baseline:  Goal status: IN  PROGRESS  2.  Patient will be able to ambulate 900' in 6 minutes to demonstrate ability to ambulate in community.   Baseline: 600' Goal status: IN PROGRESS  3.  Patient will demonstrate improved functional LE strength as demonstrated by 5x STS < 20 seconds. Baseline: 29 seconds Goal status: IN PROGRESS  4.  Patient will demonstrate at least 19/24 on DGI to improve gait stability and reduce risk for falls. Baseline: NT Goal status: IN PROGRESS  5.  Patient will report 19 points improvement on ABC scale to demonstrate improved functional ability. Baseline: 1130/1600 Goal status: IN PROGRESS  6.  Patient be able to maintain tandem stance x 30 sec bil to demonstrate decreased risk of falls.  Baseline: 15 seconds bil  Goal status: IN PROGRESS  PLAN:  PT FREQUENCY: 1-2x/week  PT DURATION: 8 weeks  PLANNED INTERVENTIONS: Therapeutic exercises, Therapeutic activity, Neuromuscular re-education, Balance training, Gait training, Patient/Family education, Self Care, Joint mobilization, Stair training, Prosthetic training, Aquatic Therapy, Cryotherapy, Moist heat, Manual therapy, and Re-evaluation  PLAN FOR NEXT SESSION: continue balance and LE strengthening, has OTAGO program for [employer], PTA 10/20/2022, 2:33 PM

## 2022-10-21 DIAGNOSIS — H903 Sensorineural hearing loss, bilateral: Secondary | ICD-10-CM | POA: Diagnosis not present

## 2022-10-25 ENCOUNTER — Ambulatory Visit: Payer: Medicare Other | Attending: Internal Medicine | Admitting: Physical Therapy

## 2022-10-25 ENCOUNTER — Encounter: Payer: Self-pay | Admitting: Physical Therapy

## 2022-10-25 DIAGNOSIS — R2681 Unsteadiness on feet: Secondary | ICD-10-CM | POA: Insufficient documentation

## 2022-10-25 DIAGNOSIS — M6281 Muscle weakness (generalized): Secondary | ICD-10-CM | POA: Insufficient documentation

## 2022-10-25 NOTE — Therapy (Signed)
OUTPATIENT PHYSICAL THERAPY LOWER EXTREMITY TREATMENT   Patient Name: [name] MRN: [id number] DOB:1941-04-17, 82 y.o., female Today's Date: 10/25/2022  END OF SESSION:  PT End of Session - 10/25/22 0936     Visit Number 5    Number of Visits 16    Date for PT Re-Evaluation 11/28/22    Authorization Type Medicare + [employer]    PT Start Time [id number]    PT Stop Time 1016    PT Time Calculation (min) 42 min    Activity Tolerance Patient tolerated treatment well    Behavior During Therapy [facility] for tasks assessed/performed               Past Medical History:  Diagnosis Date   Allergy    Anxiety    takes ativan as needed   Arthritis    Benign positional vertigo    Bursitis of shoulder, left    Cancer (HCC)    Endometrial ca/Recurrence   CKD (chronic kidney disease), stage III (HCC)    Depression    Diabetes (HCC)    History of colonic polyps    Hypercholesteremia    Hypercholesterolemia    Hypertension    Lumbar pain    Osteoarthritis    Osteoporosis    S/P radiation therapy July 29, 2010-Sep 06, 2010   External beam of pelvis   S/P radiation therapy 09/15/10, 09/24/10, 09/28/2010   Intracavitary brachytherapy   SBO (small bowel obstruction) (HCC)    Status post chemotherapy    carboplatin/paclitaxel x 6 rounds   Past Surgical History:  Procedure Laterality Date   ABDOMINAL HYSTERECTOMY     BREAST LUMPECTOMY  1993   Left breast, benign   ROBOTIC ASSISTED LAPAROSCOPIC VAGINAL HYSTERECTOMY WITH FIBROID REMOVAL  July 2010   bilat. salpingo-oophorectomy   Patient Active Problem List   Diagnosis Date Noted   Abdominal pain 09/25/2022   Dehydration 09/25/2022   Acute prerenal azotemia 09/25/2022   Leukocytosis 09/25/2022   DM2 (diabetes mellitus, type 2) (HCC) 09/25/2022   HLD (hyperlipidemia) 09/25/2022   GAD (generalized anxiety disorder) 09/25/2022   GERD (gastroesophageal reflux disease) 09/25/2022   IDA (iron deficiency anemia) 11/22/2021    Osteoporosis 08/30/2021   Acute pain of right shoulder 09/30/2019   Myofascial pain 09/18/2019   Cervical radiculopathy 09/12/2019   Essential hypertension 05/18/2013   SBO (small bowel obstruction) (HCC) 05/17/2013   Small bowel obstruction (HCC) 05/16/2013   Malignant neoplasm of corpus uteri, except isthmus (HCC) 05/16/2011    PCP: Gaspar Garbe, MD   REFERRING PROVIDER: Elease Etienne, MD   REFERRING DIAG: [id number] (ICD-10-CM) - SBO (small bowel obstruction) (HCC)   THERAPY DIAG:  Muscle weakness (generalized)  Unsteadiness on feet  Rationale for Evaluation and Treatment: Rehabilitation  ONSET DATE: Hospital Admission 09/24/2022-09/27/2022  SUBJECTIVE:   SUBJECTIVE STATEMENT: Feeling pretty good, no pain.  Trying to do her exercises everyday.   PERTINENT HISTORY: Patient is an 82 y/o female admitted 09/24/22 with abdominal pain due to SBO. PMH positive for endometrial cancer s/p robotic laparoscopic vaginal hysterectomy and BSO, XRT and chemo, CKD, prior SBO, HLD, DM, HTN.  From Acute Care PT" Patient presents with decreased mobility due to generalized weakness, decreased balance, history of fall on stairs at home last August, decreased activity tolerance and decreased sensation. She was previously independent, though sedentary, but working from home living with her daughter who works during the day. Currently minguard for hallway ambulation without device noting some veering and imbalance  and decreased ankle DF with some "head fullness" per pt from change in weather takes Meclizine at home daily. Given limited sensory systems for balance and new weakness from being NPO and decreased strength and sensation in her feet feel she will benefit from follow up outpatient PT for balance."  PAIN:  Are you having pain? No has history of neck pain but not concerned with it, able to manage it.   PRECAUTIONS: Fall  WEIGHT BEARING RESTRICTIONS: No  FALLS:  Has patient fallen in last  6 months?  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   PATIENT GOALS: get stronger  NEXT MD VISIT: 10/04/2022  OBJECTIVE:   DIAGNOSTIC FINDINGS: NA  PATIENT SURVEYS:  ABC scale 1130/1600 = 70.6%  COGNITION: Overall cognitive status: Within functional limits for tasks assessed     SENSATION: Slightly decreased sensation bottoms of feet.   POSTURE: rounded shoulders and forward head  LOWER EXTREMITY MMT:  MMT Right eval Left eval  Hip flexion 4 4  Hip extension 4 4  Hip abduction 4 4  Hip adduction 4+ 4+  Hip internal rotation    Hip external rotation    Knee flexion 5 5  Knee extension 4 4  Ankle dorsiflexion 5 5  Ankle plantarflexion 5 5  Ankle inversion    Ankle eversion     (Blank rows = not tested)   FUNCTIONAL TESTS:  5 times sit to stand: 29 seconds 6 minute walk test: 600' Berg Balance Scale: 49/56 Dynamic Gait Index: TBD MCTSIB: Condition 1: Avg of 3 trials: 30 sec, Condition 2: Avg of 3 trials: 30 sec, Condition 3: Avg of 3 trials: 30 sec, Condition 4: Avg of 3 trials: 30 sec, and Total Score: 120/120 increased sway on condition 4.   GAIT: Distance walked: 33' Assistive device utilized: None Level of assistance: Complete Independence Comments: visually slow gait speed.    TODAY'S TREATMENT:                                                                                                                              DATE:   10/25/2022 Therapeutic Exercise: to improve strength and mobility.  Demo, verbal  and tactile cues throughout for technique.  Nustep L5 x 6 min  Neuromuscular Reeducation: to improve balance and stability. SBA for safety throughout.  Gait in hallway - with head turns horizontal 3 x 100', nods x 50' - decreased gait speed with both Side stepping x 25' bil Tandem gait x 25 forward and back - 1 hand  on wall  Stepping over 1/2 foam roller - forward 2 x 10 bil  Stepping over 1/2 foam roller - side stepping 2 x 10 bil - difficulty with foot placement with R foot, appears to using hip flexors rather than glutes Toe taps on foam yoga block - 2 x 10 bil (on long side) - mirror for posture  10/20/22 Therapeutic Exercise: to improve strength and mobility.  Demo, verbal and tactile cues throughout for technique. Nustep L4x21min 18/24 on DGI Seated lumbar flexion stretch with orange pball 3x15 sec Seated scap retraction 10x3" Standing rows RTB x 10  Standing shoulder ext RTB x 10  Runner stretch x 30 for hip flexors bil Clock balance x 5 R/L postural sway bil when posteriorly  Step ups with no UE support x 5 bil  10/18/2022 Therapeutic Exercise: to improve strength and mobility.  Demo, verbal and tactile cues throughout for technique. Gait x 600' for warm-up  Reviewed OTAGO program booklet to improve balance and safety including: Head movements (rotation x 5) Neck movements (chin tucks x 5) Back Extension x 5  Strengthening: Trunk movements (trunk rotation with hands on hips x 5 each side) Ankle movements (seated ankle pumps x 10 each foot) Front knee strengthening (seated LAQ  x 10 with 2#ankle weights) Back knee strengthening (standing hamstring curls with 2# ankle weights, x 10 bil both hands on counter) Side hip strengthening (hip abduction with 2# ankle weights, x 10 bil, hands on counter) Calf raises x 20 with support 2# ankle weights Toe raises x 20 with support Balance: Knee bends with support x 10 Backwards walking with support 4 x 10 steps Walking and turning  around (figure 8 pattern)  Sideways walking 2 x 10 steps each direction  Heel toe standing x 10 sec each side with support Heel toe walking 4 x 10 steps with support One leg stand with support x 10 sec each side Heel walking with support 4 x 10 steps  Toe walking with support 4 x 10 steps Heel toe backwards walking 4 x 10 steps Sit to stands without support x 10    PATIENT EDUCATION:  Education details: see self care.  Person educated: Patient Education method: Explanation Education comprehension: verbalized understanding  HOME EXERCISE PROGRAM: OTAGO program  ASSESSMENT:  CLINICAL IMPRESSION: Focus of today's skilled therapy was on balance and gait, noted today very challenged with head turns, slowing speed significantly.  With stepping strategy did well, but had difficulty returning to starting to position with R foot, using more hip flexor and placing foot in front, rather than glutes.  Otherwise did well without any LOB.  Averianna Stetler continues to demonstrate potential for improvement and would benefit from continued skilled therapy to address impairments.     OBJECTIVE IMPAIRMENTS: decreased activity tolerance, decreased balance, decreased endurance, decreased mobility, and decreased strength.   ACTIVITY LIMITATIONS: locomotion level  PARTICIPATION LIMITATIONS: community activity  PERSONAL FACTORS: Age and 3+ comorbidities: history endometrial cancer s/p hysterectomy, CKD,  SBO, HLD, DM, HTN  are also affecting patient's functional outcome.   REHAB POTENTIAL: Good  CLINICAL DECISION MAKING: Evolving/moderate complexity  EVALUATION COMPLEXITY: Moderate   GOALS: Goals reviewed with patient? Yes  SHORT TERM GOALS: Target date: 10/17/2022   Patient will be independent with initial HEP. Baseline:  Goal status: MET 10/18/22- was on vacation so only tried 1x. 10/25/22- met  LONG TERM GOALS: Target date: 11/28/2022   Patient will be independent with advanced/ongoing HEP  to improve outcomes and carryover.  Baseline:  Goal status: IN PROGRESS  2.  Patient will be able to ambulate 900' in 6 minutes to demonstrate ability to ambulate in community.   Baseline: 600' Goal status: IN PROGRESS  3.  Patient will demonstrate improved functional LE strength as demonstrated by 5x STS < 20 seconds. Baseline: 29 seconds Goal status: IN PROGRESS  4.  Patient will demonstrate at least 19/24 on DGI to improve gait stability and reduce risk for falls. Baseline: NT Goal status: IN PROGRESS  5.  Patient will report 19 points improvement on ABC scale to demonstrate improved functional ability. Baseline: 1130/1600 Goal status: IN PROGRESS  6.  Patient be able to maintain tandem stance x 30 sec bil to demonstrate decreased risk of falls.  Baseline: 15 seconds bil  Goal status: IN PROGRESS  PLAN:  PT FREQUENCY: 1-2x/week  PT DURATION: 8 weeks  PLANNED INTERVENTIONS: Therapeutic exercises, Therapeutic activity, Neuromuscular re-education, Balance training, Gait training, Patient/Family education, Self Care, Joint mobilization, Stair training, Prosthetic training, Aquatic Therapy, Cryotherapy, Moist heat, Manual therapy, and Re-evaluation  PLAN FOR NEXT SESSION: continue balance and LE strengthening, has OTAGO program for [employer], PT 10/25/2022, 10:42 AM

## 2022-11-01 ENCOUNTER — Other Ambulatory Visit: Payer: Self-pay

## 2022-11-01 ENCOUNTER — Encounter (HOSPITAL_BASED_OUTPATIENT_CLINIC_OR_DEPARTMENT_OTHER): Payer: Self-pay

## 2022-11-01 ENCOUNTER — Inpatient Hospital Stay (HOSPITAL_BASED_OUTPATIENT_CLINIC_OR_DEPARTMENT_OTHER)
Admission: EM | Admit: 2022-11-01 | Discharge: 2022-11-03 | DRG: 390 | Disposition: A | Discharge status: Home / Self Care | Payer: Medicare Other | Attending: Internal Medicine | Admitting: Internal Medicine

## 2022-11-01 ENCOUNTER — Emergency Department (HOSPITAL_BASED_OUTPATIENT_CLINIC_OR_DEPARTMENT_OTHER): Payer: Medicare Other

## 2022-11-01 DIAGNOSIS — M810 Age-related osteoporosis without current pathological fracture: Secondary | ICD-10-CM | POA: Diagnosis present

## 2022-11-01 DIAGNOSIS — K5669 Other partial intestinal obstruction: Secondary | ICD-10-CM | POA: Diagnosis not present

## 2022-11-01 DIAGNOSIS — E1122 Type 2 diabetes mellitus with diabetic chronic kidney disease: Secondary | ICD-10-CM | POA: Diagnosis present

## 2022-11-01 DIAGNOSIS — Z9221 Personal history of antineoplastic chemotherapy: Secondary | ICD-10-CM

## 2022-11-01 DIAGNOSIS — Z8249 Family history of ischemic heart disease and other diseases of the circulatory system: Secondary | ICD-10-CM | POA: Diagnosis not present

## 2022-11-01 DIAGNOSIS — Z87891 Personal history of nicotine dependence: Secondary | ICD-10-CM

## 2022-11-01 DIAGNOSIS — Z833 Family history of diabetes mellitus: Secondary | ICD-10-CM | POA: Diagnosis not present

## 2022-11-01 DIAGNOSIS — E7800 Pure hypercholesterolemia, unspecified: Secondary | ICD-10-CM | POA: Diagnosis present

## 2022-11-01 DIAGNOSIS — K219 Gastro-esophageal reflux disease without esophagitis: Secondary | ICD-10-CM

## 2022-11-01 DIAGNOSIS — K56600 Partial intestinal obstruction, unspecified as to cause: Principal | ICD-10-CM | POA: Diagnosis present

## 2022-11-01 DIAGNOSIS — R188 Other ascites: Secondary | ICD-10-CM | POA: Diagnosis not present

## 2022-11-01 DIAGNOSIS — E860 Dehydration: Secondary | ICD-10-CM | POA: Diagnosis present

## 2022-11-01 DIAGNOSIS — E119 Type 2 diabetes mellitus without complications: Secondary | ICD-10-CM

## 2022-11-01 DIAGNOSIS — K56609 Unspecified intestinal obstruction, unspecified as to partial versus complete obstruction: Secondary | ICD-10-CM | POA: Diagnosis not present

## 2022-11-01 DIAGNOSIS — Z66 Do not resuscitate: Secondary | ICD-10-CM | POA: Diagnosis present

## 2022-11-01 DIAGNOSIS — E785 Hyperlipidemia, unspecified: Secondary | ICD-10-CM | POA: Diagnosis not present

## 2022-11-01 DIAGNOSIS — Z803 Family history of malignant neoplasm of breast: Secondary | ICD-10-CM | POA: Diagnosis not present

## 2022-11-01 DIAGNOSIS — Z888 Allergy status to other drugs, medicaments and biological substances status: Secondary | ICD-10-CM | POA: Diagnosis not present

## 2022-11-01 DIAGNOSIS — Z923 Personal history of irradiation: Secondary | ICD-10-CM

## 2022-11-01 DIAGNOSIS — F32A Depression, unspecified: Secondary | ICD-10-CM | POA: Diagnosis present

## 2022-11-01 DIAGNOSIS — I10 Essential (primary) hypertension: Secondary | ICD-10-CM | POA: Diagnosis present

## 2022-11-01 DIAGNOSIS — Z8542 Personal history of malignant neoplasm of other parts of uterus: Secondary | ICD-10-CM

## 2022-11-01 DIAGNOSIS — Z7982 Long term (current) use of aspirin: Secondary | ICD-10-CM

## 2022-11-01 DIAGNOSIS — Z79899 Other long term (current) drug therapy: Secondary | ICD-10-CM

## 2022-11-01 DIAGNOSIS — F419 Anxiety disorder, unspecified: Secondary | ICD-10-CM | POA: Diagnosis present

## 2022-11-01 LAB — COMPREHENSIVE METABOLIC PANEL
ALT: 25 U/L (ref 0–44)
AST: 22 U/L (ref 15–41)
Albumin: 3.9 g/dL (ref 3.5–5.0)
Alkaline Phosphatase: 62 U/L (ref 38–126)
Anion gap: 12 (ref 5–15)
BUN: 20 mg/dL (ref 8–23)
CO2: 25 mmol/L (ref 22–32)
Calcium: 9.2 mg/dL (ref 8.9–10.3)
Chloride: 101 mmol/L (ref 98–111)
Creatinine, Ser: 0.88 mg/dL (ref 0.44–1.00)
GFR, Estimated: >60 mL/min (ref >60)
Glucose, Bld: 182 mg/dL — ABNORMAL HIGH (ref 70–99)
Potassium: 3.9 mmol/L (ref 3.5–5.1)
Sodium: 138 mmol/L (ref 135–145)
Total Bilirubin: 0.6 mg/dL (ref 0.3–1.2)
Total Protein: 7 g/dL (ref 6.5–8.1)

## 2022-11-01 LAB — CBC WITH DIFFERENTIAL/PLATELET
Abs Immature Granulocytes: 0.03 10*3/uL (ref 0.00–0.07)
Basophils Absolute: 0 10*3/uL (ref 0.0–0.1)
Basophils Relative: 0 %
Eosinophils Absolute: 0.2 10*3/uL (ref 0.0–0.5)
Eosinophils Relative: 2 %
HCT: 41.6 % (ref 36.0–46.0)
Hemoglobin: 13.3 g/dL (ref 12.0–15.0)
Immature Granulocytes: 0 %
Lymphocytes Relative: 11 %
Lymphs Abs: 1 10*3/uL (ref 0.7–4.0)
MCH: 28.2 pg (ref 26.0–34.0)
MCHC: 32 g/dL (ref 30.0–36.0)
MCV: 88.3 fL (ref 80.0–100.0)
Monocytes Absolute: 0.4 10*3/uL (ref 0.1–1.0)
Monocytes Relative: 4 %
Neutro Abs: 7.8 10*3/uL — ABNORMAL HIGH (ref 1.7–7.7)
Neutrophils Relative %: 83 %
Platelets: 321 10*3/uL (ref 150–400)
RBC: 4.71 MIL/uL (ref 3.87–5.11)
RDW: 14.6 % (ref 11.5–15.5)
WBC: 9.4 10*3/uL (ref 4.0–10.5)
nRBC: 0 % (ref 0.0–0.2)

## 2022-11-01 LAB — LIPASE, BLOOD: Lipase: 35 U/L (ref 11–51)

## 2022-11-01 MED ORDER — SODIUM CHLORIDE 0.9 % IV BOLUS
1000.0000 mL | Freq: Once | INTRAVENOUS | Status: AC
  Administered 2022-11-01: 1000 mL via INTRAVENOUS

## 2022-11-01 MED ORDER — ONDANSETRON HCL 4 MG/2ML IJ SOLN
4.0000 mg | Freq: Once | INTRAMUSCULAR | Status: AC
  Administered 2022-11-01: 4 mg via INTRAVENOUS
  Filled 2022-11-01: qty 2

## 2022-11-01 MED ORDER — IOHEXOL 300 MG/ML  SOLN
100.0000 mL | Freq: Once | INTRAMUSCULAR | Status: AC | PRN
  Administered 2022-11-01: 100 mL via INTRAVENOUS

## 2022-11-01 MED ORDER — MORPHINE SULFATE (PF) 4 MG/ML IV SOLN
4.0000 mg | Freq: Once | INTRAVENOUS | Status: AC
  Administered 2022-11-01: 4 mg via INTRAVENOUS
  Filled 2022-11-01: qty 1

## 2022-11-01 NOTE — Progress Notes (Signed)
Plan of Care Note for accepted transfer   Patient: Anitza Amante MRN: [id number]   DOA: 11/01/2022  Facility requesting transfer: [facility] Requesting Provider: Dr. Adela Lank Reason for transfer: SBO Facility course: Pt with SBO, very extensive history of same.  Dr. Magnus Ivan said surg will see in hospital.  No NGT but hasn't needed one before.  No vomiting since arrival to ED.  Plan of care: The patient is accepted for admission to Med-surg  unit, at [facility]..   [facility] will assume care on arrival to accepting facility. Until arrival, care as per EDP. However, [facility] available 24/7 for questions and assistance.  Nursing staff, please page [facility] Admits and Consults [phone]) as soon as the patient arrives to the hospital.    Author: Hillary Bow., DO 11/01/2022  Check [email or web address] for on-call coverage.  Nursing staff, Please call [facility] Admits & Consults System-Wide number on Amion as soon as patient's arrival, so appropriate admitting provider can evaluate the pt.

## 2022-11-01 NOTE — ED Notes (Signed)
[facility] called for transport @21 :27

## 2022-11-01 NOTE — ED Provider Notes (Signed)
[place] EMERGENCY DEPARTMENT AT [facility] Provider Note   CSN: [id number] Arrival date & time: 11/01/22  1854     History  Chief Complaint  Patient presents with   Abdominal Pain    [name] is a 82 y.o. female.  82 yo F with a chief complaints for abdominal discomfort.  Patient unfortunately has had multiple small bowel obstructions.  She thinks it is due to endometrial cancer where she had radiation performed.  She thinks this feels similar to the last time she had had.  Pain started this morning.  Has had nausea and vomiting.  Has been passing stool and flatus but not for a few hours now.  No fevers.  No urinary symptoms.   Abdominal Pain      Home Medications Prior to Admission medications   Medication Sig Start Date End Date Taking? Authorizing Provider  acetaminophen (TYLENOL) 325 MG tablet Take 650 mg by mouth every 6 (six) hours as needed.    [provider]  aspirin 325 MG tablet Take 325 mg by mouth daily.    [provider]  atorvastatin (LIPITOR) 40 MG tablet Take 40 mg by mouth at bedtime. 11/05/15   [provider]  B Complex-C-Folic Acid (FOLBEE PLUS) TABS Take 1 tablet by mouth daily. 08/25/22   [provider]  benazepril (LOTENSIN) 40 MG tablet Take 40 mg by mouth daily. 01/06/18   [provider]  cetirizine (ZYRTEC) 10 MG tablet Take 10 mg by mouth daily as needed.    [provider]  Cholecalciferol (VITAMIN D) 125 MCG (5000 UT) CAPS Take 5,000 Units by mouth daily.    [provider]  clobetasol ointment (TEMOVATE) 0.05 % Apply 1 Application topically 2 (two) times a week. 06/30/22   Antionette Char, MD  ibuprofen (ADVIL,MOTRIN) 200 MG tablet Take 200 mg by mouth every 6 (six) hours as needed.    [provider]  LORazepam (ATIVAN) 0.5 MG tablet Take 0.25 mg by mouth as needed for anxiety.    [provider]  meclizine (ANTIVERT) 25 MG tablet Take 25 mg by  mouth 4 (four) times daily as needed for dizziness or nausea.    [provider]  Multiple Vitamin (MULTIVITAMIN) tablet Take 1 tablet by mouth daily. Centrum silver    [provider]  omeprazole (PRILOSEC) 40 MG capsule Take 1 capsule (40 mg total) by mouth daily. 11/17/21   Hilarie Fredrickson, MD  Polyethyl Glycol-Propyl Glycol 0.4-0.3 % SOLN Place 1 drop into both eyes daily as needed (for dry eyes).    [provider]  polyethylene glycol (MIRALAX / GLYCOLAX) 17 g packet Take 17 g by mouth daily as needed for mild constipation or moderate constipation. 09/27/22   Hongalgi, Maximino Greenland, MD      Allergies    Phenobarbital    Review of Systems   Review of Systems  Gastrointestinal:  Positive for abdominal pain.    Physical Exam Updated Vital Signs BP (!) 161/71   Pulse 84   Temp 97.7 F (36.5 C) (Oral)   Resp 17   Ht 5\' 4"  (1.626 m)   Wt 66.7 kg   SpO2 95%   BMI 25.23 kg/m  Physical Exam Vitals and nursing note reviewed.  Constitutional:      General: She is not in acute distress.    Appearance: She is well-developed. She is not diaphoretic.  HENT:     Head: Normocephalic and atraumatic.  Eyes:  Pupils: Pupils are equal, round, and reactive to light.  Cardiovascular:     Rate and Rhythm: Normal rate and regular rhythm.     Heart sounds: No murmur heard.    No friction rub. No gallop.  Pulmonary:     Effort: Pulmonary effort is normal.     Breath sounds: No wheezing or rales.  Abdominal:     General: There is no distension.     Palpations: Abdomen is soft.     Tenderness: There is abdominal tenderness.     Comments: Mild diffuse abdominal pain  Musculoskeletal:        General: No tenderness.     Cervical back: Normal range of motion and neck supple.  Skin:    General: Skin is warm and dry.  Neurological:     Mental Status: She is alert and oriented to person, place, and time.  Psychiatric:        Behavior: Behavior normal.     ED Results  / Procedures / Treatments   Labs (all labs ordered are listed, but only abnormal results are displayed) Labs Reviewed  CBC WITH DIFFERENTIAL/PLATELET - Abnormal; Notable for the following components:      Result Value   Neutro Abs 7.8 (*)    All other components within normal limits  COMPREHENSIVE METABOLIC PANEL - Abnormal; Notable for the following components:   Glucose, Bld 182 (*)    All other components within normal limits  LIPASE, BLOOD  URINALYSIS, ROUTINE W REFLEX MICROSCOPIC    EKG None  Radiology CT ABDOMEN PELVIS W CONTRAST  Result Date: 11/01/2022 CLINICAL DATA:  Abdominal pain. EXAM: CT ABDOMEN AND PELVIS WITH CONTRAST TECHNIQUE: Multidetector CT imaging of the abdomen and pelvis was performed using the standard protocol following bolus administration of intravenous contrast. RADIATION DOSE REDUCTION: This exam was performed according to the departmental dose-optimization program which includes automated exposure control, adjustment of the mA and/or kV according to patient size and/or use of iterative reconstruction technique. CONTRAST:  OMNIPAQUE IOHEXOL 300 MG/ML  SOLN COMPARISON:  CT abdomen pelvis dated 09/24/2022. FINDINGS: Evaluation is limited due to streak artifact caused by patient's arms. Lower chest: The visualized lung bases are clear. There is coronary vascular calcification. No intra-abdominal free air.  Small ascites. Hepatobiliary: The liver is unremarkable. There is mild biliary ductal dilatation versus mild periportal edema. The gallbladder is unremarkable. Pancreas: Unremarkable. No pancreatic ductal dilatation or surrounding inflammatory changes. Spleen: Normal in size without focal abnormality. Adrenals/Urinary Tract: The adrenal glands are unremarkable. There is no hydronephrosis on either side. There is symmetric enhancement and excretion of contrast by both kidneys. The visualized ureters and the urinary bladder appear unremarkable. Stomach/Bowel:  Multiple top normal caliber fluid-filled loops of small bowel measure up to 2.6 cm in diameter. A transition noted in the pelvis anteriorly (coronal 63/6 and axial 72/3). There is mild thickened appearance of the loop of bowel just distal to the transition. Findings consistent with a degree of small-bowel obstruction, possibly secondary to enteritis and thickening of the distal bowel with luminal narrowing. Clinical correlation is recommended. The appendix is normal. Vascular/Lymphatic: Mild aortoiliac atherosclerotic disease. The IVC is unremarkable. No portal venous gas. There is no adenopathy. Reproductive: Hysterectomy.  No adnexal masses. Other: None Musculoskeletal: Pectus excavatum. Degenerative changes of the spine and SI joints. Old healed left pubic bone fractures. No acute osseous pathology. IMPRESSION: 1. Small-bowel obstruction with a transition in the pelvis, possibly secondary to enteritis. 2. Small ascites. 3.  Aortic  Atherosclerosis (ICD10-I70.0). Electronically Signed   By: Elgie Collard M.D.   On: 11/01/2022 20:58    Procedures Procedures    Medications Ordered in ED Medications  sodium chloride 0.9 % bolus 1,000 mL (0 mLs Intravenous Stopped 11/01/22 2101)  morphine (PF) 4 MG/ML injection 4 mg (4 mg Intravenous Given 11/01/22 1930)  ondansetron (ZOFRAN) injection 4 mg (4 mg Intravenous Given 11/01/22 1932)  iohexol (OMNIPAQUE) 300 MG/ML solution 100 mL (100 mLs Intravenous Contrast Given 11/01/22 2019)    ED Course/ Medical Decision Making/ A&P                             Medical Decision Making Amount and/or Complexity of Data Reviewed Labs: ordered. Radiology: ordered.  Risk Prescription drug management.   82 yo F with a chief complaint of diffuse abdominal pain nausea and vomiting.  On my record review the patient has a history of endometrial cancer has had hysterectomy chemotherapy and radiation therapy.  She has had recurrent small bowel obstructions and thinks this  feels the same.  Will obtain a laboratory evaluation and CT imaging.  Reassess.  No anemia, no leukocytosis, no significant electrolyte abnormalities of his lipase are unremarkable.  CT scan is concerning for small bowel obstruction, radiology sees a possible transition in the pelvis.  Will discuss with general surgery.  I discussed case with Dr. Magnus Ivan, recommends medical admission.  The patients results and plan were reviewed and discussed.   Any x-rays performed were independently reviewed by myself.   Differential diagnosis were considered with the presenting HPI.  Medications  sodium chloride 0.9 % bolus 1,000 mL (0 mLs Intravenous Stopped 11/01/22 2101)  morphine (PF) 4 MG/ML injection 4 mg (4 mg Intravenous Given 11/01/22 1930)  ondansetron (ZOFRAN) injection 4 mg (4 mg Intravenous Given 11/01/22 1932)  iohexol (OMNIPAQUE) 300 MG/ML solution 100 mL (100 mLs Intravenous Contrast Given 11/01/22 2019)    Vitals:   11/01/22 1903 11/01/22 1904 11/01/22 2000 11/01/22 2055  BP:  (!) 154/76 (!) 161/71   Pulse:  92 83 84  Resp:  17 18 17   Temp:  97.7 F (36.5 C)    TempSrc:  Oral    SpO2:  98% 94% 95%  Weight: 66.7 kg     Height: 5\' 4"  (1.626 m)       Final diagnoses:  SBO (small bowel obstruction) (HCC)    Admission/ observation were discussed with the admitting physician, patient and/or family and they are comfortable with the plan.          Final Clinical Impression(s) / ED Diagnoses Final diagnoses:  SBO (small bowel obstruction) [facility])    Rx / DC Orders ED Discharge Orders     None         Melene Plan, DO 11/01/22 2119

## 2022-11-01 NOTE — ED Notes (Signed)
Attempted to call report to [facility], was told they would return call

## 2022-11-01 NOTE — ED Triage Notes (Addendum)
Pt reports lower and middle abd pain that started this morning and has progressed throughout the day associated with nausea & vomiting. Denies diarrhea. Hx of Bowel Obstruction.

## 2022-11-02 ENCOUNTER — Other Ambulatory Visit: Payer: Self-pay

## 2022-11-02 ENCOUNTER — Observation Stay (HOSPITAL_COMMUNITY): Payer: Medicare Other

## 2022-11-02 ENCOUNTER — Inpatient Hospital Stay (HOSPITAL_COMMUNITY): Payer: Medicare Other

## 2022-11-02 ENCOUNTER — Ambulatory Visit: Payer: Medicare Other | Admitting: Physical Therapy

## 2022-11-02 DIAGNOSIS — E785 Hyperlipidemia, unspecified: Secondary | ICD-10-CM

## 2022-11-02 DIAGNOSIS — K56600 Partial intestinal obstruction, unspecified as to cause: Secondary | ICD-10-CM | POA: Diagnosis present

## 2022-11-02 DIAGNOSIS — Z833 Family history of diabetes mellitus: Secondary | ICD-10-CM | POA: Diagnosis not present

## 2022-11-02 DIAGNOSIS — E860 Dehydration: Secondary | ICD-10-CM | POA: Diagnosis present

## 2022-11-02 DIAGNOSIS — Z7982 Long term (current) use of aspirin: Secondary | ICD-10-CM | POA: Diagnosis not present

## 2022-11-02 DIAGNOSIS — Z888 Allergy status to other drugs, medicaments and biological substances status: Secondary | ICD-10-CM | POA: Diagnosis not present

## 2022-11-02 DIAGNOSIS — E119 Type 2 diabetes mellitus without complications: Secondary | ICD-10-CM

## 2022-11-02 DIAGNOSIS — M810 Age-related osteoporosis without current pathological fracture: Secondary | ICD-10-CM | POA: Diagnosis present

## 2022-11-02 DIAGNOSIS — Z803 Family history of malignant neoplasm of breast: Secondary | ICD-10-CM | POA: Diagnosis not present

## 2022-11-02 DIAGNOSIS — K56609 Unspecified intestinal obstruction, unspecified as to partial versus complete obstruction: Secondary | ICD-10-CM | POA: Diagnosis present

## 2022-11-02 DIAGNOSIS — K5669 Other partial intestinal obstruction: Secondary | ICD-10-CM | POA: Diagnosis not present

## 2022-11-02 DIAGNOSIS — E1122 Type 2 diabetes mellitus with diabetic chronic kidney disease: Secondary | ICD-10-CM | POA: Diagnosis present

## 2022-11-02 DIAGNOSIS — Z923 Personal history of irradiation: Secondary | ICD-10-CM | POA: Diagnosis not present

## 2022-11-02 DIAGNOSIS — F32A Depression, unspecified: Secondary | ICD-10-CM | POA: Diagnosis present

## 2022-11-02 DIAGNOSIS — Z79899 Other long term (current) drug therapy: Secondary | ICD-10-CM | POA: Diagnosis not present

## 2022-11-02 DIAGNOSIS — Z8249 Family history of ischemic heart disease and other diseases of the circulatory system: Secondary | ICD-10-CM | POA: Diagnosis not present

## 2022-11-02 DIAGNOSIS — Z9221 Personal history of antineoplastic chemotherapy: Secondary | ICD-10-CM | POA: Diagnosis not present

## 2022-11-02 DIAGNOSIS — F419 Anxiety disorder, unspecified: Secondary | ICD-10-CM | POA: Diagnosis present

## 2022-11-02 DIAGNOSIS — I10 Essential (primary) hypertension: Secondary | ICD-10-CM

## 2022-11-02 DIAGNOSIS — K219 Gastro-esophageal reflux disease without esophagitis: Secondary | ICD-10-CM

## 2022-11-02 DIAGNOSIS — Z8542 Personal history of malignant neoplasm of other parts of uterus: Secondary | ICD-10-CM | POA: Diagnosis not present

## 2022-11-02 DIAGNOSIS — Z66 Do not resuscitate: Secondary | ICD-10-CM | POA: Diagnosis present

## 2022-11-02 DIAGNOSIS — E7800 Pure hypercholesterolemia, unspecified: Secondary | ICD-10-CM | POA: Diagnosis present

## 2022-11-02 DIAGNOSIS — Z87891 Personal history of nicotine dependence: Secondary | ICD-10-CM | POA: Diagnosis not present

## 2022-11-02 LAB — GLUCOSE, CAPILLARY
Glucose-Capillary: 119 mg/dL — ABNORMAL HIGH (ref 70–99)
Glucose-Capillary: 132 mg/dL — ABNORMAL HIGH (ref 70–99)
Glucose-Capillary: 104 mg/dL — ABNORMAL HIGH (ref 70–99)
Glucose-Capillary: 97 mg/dL (ref 70–99)

## 2022-11-02 LAB — BASIC METABOLIC PANEL
Anion gap: 5 (ref 5–15)
BUN: 16 mg/dL (ref 8–23)
CO2: 26 mmol/L (ref 22–32)
Calcium: 8.2 mg/dL — ABNORMAL LOW (ref 8.9–10.3)
Chloride: 108 mmol/L (ref 98–111)
Creatinine, Ser: 0.78 mg/dL (ref 0.44–1.00)
GFR, Estimated: >60 mL/min (ref >60)
Glucose, Bld: 131 mg/dL — ABNORMAL HIGH (ref 70–99)
Potassium: 3.5 mmol/L (ref 3.5–5.1)
Sodium: 139 mmol/L (ref 135–145)

## 2022-11-02 LAB — CBC
HCT: 35.3 % — ABNORMAL LOW (ref 36.0–46.0)
Hemoglobin: 11.1 g/dL — ABNORMAL LOW (ref 12.0–15.0)
MCH: 28.5 pg (ref 26.0–34.0)
MCHC: 31.4 g/dL (ref 30.0–36.0)
MCV: 90.5 fL (ref 80.0–100.0)
Platelets: 278 10*3/uL (ref 150–400)
RBC: 3.9 MIL/uL (ref 3.87–5.11)
RDW: 14.7 % (ref 11.5–15.5)
WBC: 7 10*3/uL (ref 4.0–10.5)
nRBC: 0 % (ref 0.0–0.2)

## 2022-11-02 MED ORDER — INSULIN ASPART 100 UNIT/ML IJ SOLN: 0.0000 [IU] | Freq: Every day | INTRAMUSCULAR | Status: DC

## 2022-11-02 MED ORDER — ACETAMINOPHEN 325 MG PO TABS
650.0000 mg | ORAL_TABLET | Freq: Four times a day (QID) | ORAL | Status: DC | PRN
  Administered 2022-11-03: 650 mg via ORAL
  Filled 2022-11-02: qty 2

## 2022-11-02 MED ORDER — INSULIN ASPART 100 UNIT/ML IJ SOLN
0.0000 [IU] | Freq: Three times a day (TID) | INTRAMUSCULAR | Status: DC
  Administered 2022-11-02: 1 [IU] via SUBCUTANEOUS

## 2022-11-02 MED ORDER — POLYVINYL ALCOHOL 1.4 % OP SOLN: 1.0000 [drp] | Freq: Every day | OPHTHALMIC | Status: DC | PRN

## 2022-11-02 MED ORDER — TRAZODONE HCL 50 MG PO TABS: 25.0000 mg | ORAL_TABLET | Freq: Every evening | ORAL | Status: DC | PRN

## 2022-11-02 MED ORDER — MORPHINE SULFATE (PF) 2 MG/ML IV SOLN: 2.0000 mg | INTRAVENOUS | Status: DC | PRN

## 2022-11-02 MED ORDER — LORAZEPAM 0.5 MG PO TABS: 0.2500 mg | ORAL_TABLET | ORAL | Status: DC | PRN

## 2022-11-02 MED ORDER — LORATADINE 10 MG PO TABS
10.0000 mg | ORAL_TABLET | Freq: Every day | ORAL | Status: DC
  Administered 2022-11-02 – 2022-11-03 (×2): 10 mg via ORAL
  Filled 2022-11-02 (×2): qty 1

## 2022-11-02 MED ORDER — ADULT MULTIVITAMIN W/MINERALS CH
1.0000 | ORAL_TABLET | Freq: Every day | ORAL | Status: DC
  Administered 2022-11-02 – 2022-11-03 (×2): 1 via ORAL
  Filled 2022-11-02 (×2): qty 1

## 2022-11-02 MED ORDER — ATORVASTATIN CALCIUM 40 MG PO TABS
40.0000 mg | ORAL_TABLET | Freq: Every day | ORAL | Status: DC
  Administered 2022-11-02 – 2022-11-03 (×2): 40 mg via ORAL
  Filled 2022-11-02 (×2): qty 1

## 2022-11-02 MED ORDER — BENAZEPRIL HCL 20 MG PO TABS
40.0000 mg | ORAL_TABLET | Freq: Every day | ORAL | Status: DC
  Administered 2022-11-02 – 2022-11-03 (×2): 40 mg via ORAL
  Filled 2022-11-02 (×2): qty 2

## 2022-11-02 MED ORDER — POLYETHYLENE GLYCOL 3350 17 G PO PACK: 17.0000 g | PACK | Freq: Every day | ORAL | Status: DC | PRN

## 2022-11-02 MED ORDER — DIATRIZOATE MEGLUMINE & SODIUM 66-10 % PO SOLN
90.0000 mL | Freq: Once | ORAL | Status: AC
  Administered 2022-11-02: 90 mL via ORAL
  Filled 2022-11-02: qty 90

## 2022-11-02 MED ORDER — B COMPLEX-C PO TABS
1.0000 | ORAL_TABLET | Freq: Every day | ORAL | Status: DC
  Administered 2022-11-02 – 2022-11-03 (×2): 1 via ORAL
  Filled 2022-11-02 (×2): qty 1

## 2022-11-02 MED ORDER — ACETAMINOPHEN 650 MG RE SUPP: 650.0000 mg | Freq: Four times a day (QID) | RECTAL | Status: DC | PRN

## 2022-11-02 MED ORDER — ONDANSETRON HCL 4 MG/2ML IJ SOLN: 4.0000 mg | Freq: Four times a day (QID) | INTRAMUSCULAR | Status: DC | PRN

## 2022-11-02 MED ORDER — ENOXAPARIN SODIUM 40 MG/0.4ML IJ SOSY
40.0000 mg | PREFILLED_SYRINGE | INTRAMUSCULAR | Status: DC
  Administered 2022-11-02 – 2022-11-03 (×2): 40 mg via SUBCUTANEOUS
  Filled 2022-11-02 (×2): qty 0.4

## 2022-11-02 MED ORDER — VITAMIN D 25 MCG (1000 UNIT) PO TABS
5000.0000 [IU] | ORAL_TABLET | Freq: Every day | ORAL | Status: DC
  Administered 2022-11-02 – 2022-11-03 (×2): 5000 [IU] via ORAL
  Filled 2022-11-02 (×2): qty 5

## 2022-11-02 MED ORDER — SODIUM CHLORIDE 0.9 % IV SOLN: INTRAVENOUS | Status: DC

## 2022-11-02 MED ORDER — ONDANSETRON HCL 4 MG PO TABS: 4.0000 mg | ORAL_TABLET | Freq: Four times a day (QID) | ORAL | Status: DC | PRN

## 2022-11-02 MED ORDER — PANTOPRAZOLE SODIUM 40 MG PO TBEC
40.0000 mg | DELAYED_RELEASE_TABLET | Freq: Every day | ORAL | Status: DC
  Administered 2022-11-02 – 2022-11-03 (×2): 40 mg via ORAL
  Filled 2022-11-02 (×2): qty 1

## 2022-11-02 MED ORDER — MECLIZINE HCL 25 MG PO TABS: 25.0000 mg | ORAL_TABLET | Freq: Four times a day (QID) | ORAL | Status: DC | PRN

## 2022-11-02 NOTE — Plan of Care (Signed)

## 2022-11-02 NOTE — Progress Notes (Signed)
Mobility Specialist - Progress Note   11/02/22 1418  Mobility  Activity Ambulated with assistance in hallway  Level of Assistance Standby assist, set-up cues, supervision of patient - no hands on  Assistive Device Front wheel walker  Distance Ambulated (ft) 480 ft  Activity Response Tolerated well  Mobility Referral Yes  $Mobility charge 1 Mobility  Mobility Specialist Stop Time (ACUTE ONLY) 0216   Pt received in bed and agreeable to mobility. No complaints during session. Pt to bed after session with all needs met.    [facility]

## 2022-11-02 NOTE — Progress Notes (Addendum)
[facility]                                                                               [name], is a 82 y.o. female, DOB - [birth year], QMV:[id number] Admit date - 11/01/2022    Outpatient Primary MD for the patient is Tisovec, Adelfa Koh, MD  LOS - 0  days    Brief summary  82 y.o. Caucasian female with medical history significant for BPV, type 2 diabetes mellitus, depression, anxiety, hypertension, dyslipidemia and osteoarthritis, who presented to the emergency room with acute onset of generalized abdominal pain with associated nausea and vomiting since yesterday afternoon     Assessment & Plan    Assessment and Plan:   Recurrent SBO - conservative measures with NPO, ambulation, anti emetics.  - gen surgery on board.     Hypertension Well controlled.    Type 2 DM Continue with SSI.    Hyperlipidemia   GERD Stable.     Estimated body mass index is 24.11 kg/m as calculated from the following:   Height as of this encounter: 5\' 4"  (1.626 m).   Weight as of this encounter: 63.7 kg.  Code Status: dnr DVT Prophylaxis:  enoxaparin (LOVENOX) injection 40 mg Start: 11/02/22 1000   Level of Care: Level of care: Med-Surg Family Communication: Updated patient's daughter at bedside.   Disposition Plan:     Remains inpatient appropriate:  RECURRENT SBO  Procedures:  CT ABD   Consultants:   Gen surgery.   Antimicrobials:   Anti-infectives (From admission, onward)    None        Medications  Scheduled Meds:  atorvastatin  40 mg Oral Daily   B-complex with vitamin C  1 tablet Oral Daily   benazepril  40 mg Oral Daily   cholecalciferol  5,000 Units Oral Daily   enoxaparin (LOVENOX) injection  40 mg Subcutaneous Q24H   insulin aspart  0-5 Units Subcutaneous QHS   insulin aspart  0-9 Units Subcutaneous TID WC   loratadine  10 mg Oral Daily   multivitamin with minerals  1 tablet Oral Daily   pantoprazole  40 mg Oral Daily    Continuous Infusions:  sodium chloride 100 mL/hr at 11/02/22 1805   PRN Meds:.acetaminophen **OR** acetaminophen, LORazepam, meclizine, morphine injection, ondansetron **OR** ondansetron (ZOFRAN) IV, polyethylene glycol, polyvinyl alcohol, traZODone    Subjective:   [name] was seen and examined today.  ABD pain improved.   Objective:   Vitals:   11/02/22 0101 11/02/22 0106 11/02/22 0425 11/02/22 0931  BP: (!) 147/56  (!) 139/56 (!) 135/58  Pulse: 76  72 66  Resp: 16  18   Temp: 98.5 F (36.9 C)  98.2 F (36.8 C) 98.6 F (37 C)  TempSrc: Oral  Oral Oral  SpO2: 98%  96% 94%  Weight:  63.7 kg    Height:  5\' 4"  (1.626 m)      Intake/Output Summary (Last 24 hours) at 11/02/2022 1847 Last data filed at 11/02/2022 1805 Gross per 24 hour  Intake 2648.96 ml  Output --  Net 2648.96 ml   Filed Weights   11/01/22 1903 11/02/22 0106  Weight:  66.7 kg 63.7 kg     Exam General: Alert and oriented x 3, NAD Cardiovascular: S1 S2 auscultated, no murmurs, RRR Respiratory: Clear to auscultation bilaterally, no wheezing, rales or rhonchi Gastrointestinal: Soft, nontender, nondistended, + bowel sounds Ext: no pedal edema bilaterally Neuro: AAOx3, Cr N's II- XII. Strength 5/5 upper and lower extremities bilaterally Skin: No rashes Psych: Normal affect and demeanor, alert and oriented x3    Data Reviewed:  I have personally reviewed following labs and imaging studies   CBC Lab Results  Component Value Date   WBC 7.0 11/02/2022   RBC 3.90 11/02/2022   HGB 11.1 (L) 11/02/2022   HCT 35.3 (L) 11/02/2022   MCV 90.5 11/02/2022   MCH 28.5 11/02/2022   PLT 278 11/02/2022   MCHC 31.4 11/02/2022   RDW 14.7 11/02/2022   LYMPHSABS 1.0 11/01/2022   MONOABS 0.4 11/01/2022   EOSABS 0.2 11/01/2022   BASOSABS 0.0 11/01/2022     Last metabolic panel Lab Results  Component Value Date   NA 139 11/02/2022   K 3.5 11/02/2022   CL 108 11/02/2022   CO2 26 11/02/2022   BUN  16 11/02/2022   CREATININE 0.78 11/02/2022   GLUCOSE 131 (H) 11/02/2022   GFRNONAA >60 11/02/2022   GFRAA >60 03/28/2019   CALCIUM 8.2 (L) 11/02/2022   PROT 7.0 11/01/2022   ALBUMIN 3.9 11/01/2022   BILITOT 0.6 11/01/2022   ALKPHOS 62 11/01/2022   AST 22 11/01/2022   ALT 25 11/01/2022   ANIONGAP 5 11/02/2022    CBG (last 3)  Recent Labs    11/02/22 0813 11/02/22 1141 11/02/22 1801  GLUCAP 119* 132* 104*      Coagulation Profile: No results for input(s): "INR", "PROTIME" in the last 168 hours.   Radiology Studies: DG Abd 2 Views  Result Date: 11/02/2022 CLINICAL DATA:  Follow up small bowel obstruction EXAM: ABDOMEN - 2 VIEW COMPARISON:  11/01/2022 CT FINDINGS: Scattered large and small bowel gas is noted. The degree of small-bowel dilatation has improved in the interval from the prior exam. Chronic scoliosis of the lumbar spine is noted. Healed left pubic rami fractures are noted. IMPRESSION: Resolution of previously seen small bowel dilatation. Electronically Signed   By: Alcide Clever M.D.   On: 11/02/2022 12:10   CT ABDOMEN PELVIS W CONTRAST  Result Date: 11/01/2022 CLINICAL DATA:  Abdominal pain. EXAM: CT ABDOMEN AND PELVIS WITH CONTRAST TECHNIQUE: Multidetector CT imaging of the abdomen and pelvis was performed using the standard protocol following bolus administration of intravenous contrast. RADIATION DOSE REDUCTION: This exam was performed according to the departmental dose-optimization program which includes automated exposure control, adjustment of the mA and/or kV according to patient size and/or use of iterative reconstruction technique. CONTRAST:  OMNIPAQUE IOHEXOL 300 MG/ML  SOLN COMPARISON:  CT abdomen pelvis dated 09/24/2022. FINDINGS: Evaluation is limited due to streak artifact caused by patient's arms. Lower chest: The visualized lung bases are clear. There is coronary vascular calcification. No intra-abdominal free air.  Small ascites. Hepatobiliary: The  liver is unremarkable. There is mild biliary ductal dilatation versus mild periportal edema. The gallbladder is unremarkable. Pancreas: Unremarkable. No pancreatic ductal dilatation or surrounding inflammatory changes. Spleen: Normal in size without focal abnormality. Adrenals/Urinary Tract: The adrenal glands are unremarkable. There is no hydronephrosis on either side. There is symmetric enhancement and excretion of contrast by both kidneys. The visualized ureters and the urinary bladder appear unremarkable. Stomach/Bowel: Multiple top normal caliber fluid-filled loops of small bowel measure  up to 2.6 cm in diameter. A transition noted in the pelvis anteriorly (coronal 63/6 and axial 72/3). There is mild thickened appearance of the loop of bowel just distal to the transition. Findings consistent with a degree of small-bowel obstruction, possibly secondary to enteritis and thickening of the distal bowel with luminal narrowing. Clinical correlation is recommended. The appendix is normal. Vascular/Lymphatic: Mild aortoiliac atherosclerotic disease. The IVC is unremarkable. No portal venous gas. There is no adenopathy. Reproductive: Hysterectomy.  No adnexal masses. Other: None Musculoskeletal: Pectus excavatum. Degenerative changes of the spine and SI joints. Old healed left pubic bone fractures. No acute osseous pathology. IMPRESSION: 1. Small-bowel obstruction with a transition in the pelvis, possibly secondary to enteritis. 2. Small ascites. 3.  Aortic Atherosclerosis (ICD10-I70.0). Electronically Signed   By: Elgie Collard M.D.   On: 11/01/2022 20:58       Kathlen Mody M.D. [facility] 11/02/2022, 6:47 PM  Available via Epic secure chat 7am-7pm After 7 pm, please refer to night coverage provider listed on amion.

## 2022-11-02 NOTE — Assessment & Plan Note (Signed)
-   We will continue PPI therapy 

## 2022-11-02 NOTE — Assessment & Plan Note (Signed)
-   The patient will be placed on supplemental coverage with NovoLog. 

## 2022-11-02 NOTE — Consult Note (Signed)
[name] 1941-01-05  [id number].    Requesting MD: Dr. Kathlen Mody Chief Complaint/Reason for Consult: SBO  HPI: [name] is a 82 y.o. female with a hx of CKD3, HTN, HLD, and remote hx of endometrial cancer s/p radiation, chemotherapy and robotic assisted hysterectomy (2010) who presented with abdominal pain.  Reports yesterday afternoon she began having generalized abdominal pain with associated nausea and vomiting x 2.  Last episode of emesis was yesterday before 6 PM.  She has had 3 soft BMs since symptom onset.  No flatus.  She presented to the ED for evaluation as this felt very similar to her prior bowel obstructions in 2014 and June 2024.  Both of her SBO's in the past resolved with conservative measures.  In the ED she was afebrile without tachycardia or systolic hypertension.  WBC wnl. Cr wnl. CT A/P with SBO with transition in the pelvis questioned to be 2/2 to enteritis.  During her last admission her SBO was felt to be either secondary to adhesions versus radiation changes.   She was admitted to [facility]  She reports since being admitted her abdominal pain has nearly resolved and she now just feels "sore".  No further nausea or vomiting.  She is still not passing flatus.  Last BM yesterday evening  Last Colonoscopy: She reports she had one in Fall 2023 that was normal. I cannot see these results. Last results I see if from 2020 where there were 2 benign polyp's in the ascending colon but otherwise was normal.  Prior Abdominal Surgeries: robotic assisted hysterectomy (2010) Blood Thinners: None Tobacco Use: None  ROS: ROS As above, see hpi  Family History  Problem Relation Age of Onset   Breast cancer Mother    Heart disease Father    Breast cancer Sister    Diabetes Sister    Colon cancer Neg Hx    Esophageal cancer Neg Hx    Rectal cancer Neg Hx    Stomach cancer Neg Hx     Past Medical History:  Diagnosis Date   Allergy    Anxiety    takes ativan as  needed   Arthritis    Benign positional vertigo    Bursitis of shoulder, left    Cancer (HCC)    Endometrial ca/Recurrence   CKD (chronic kidney disease), stage III (HCC)    Depression    Diabetes (HCC)    History of colonic polyps    Hypercholesteremia    Hypercholesterolemia    Hypertension    Lumbar pain    Osteoarthritis    Osteoporosis    S/P radiation therapy July 29, 2010-Sep 06, 2010   External beam of pelvis   S/P radiation therapy 09/15/10, 09/24/10, 09/28/2010   Intracavitary brachytherapy   SBO (small bowel obstruction) (HCC)    Status post chemotherapy    carboplatin/paclitaxel x 6 rounds    Past Surgical History:  Procedure Laterality Date   ABDOMINAL HYSTERECTOMY     BREAST LUMPECTOMY  1993   Left breast, benign   ROBOTIC ASSISTED LAPAROSCOPIC VAGINAL HYSTERECTOMY WITH FIBROID REMOVAL  July 2010   bilat. salpingo-oophorectomy    Social History:  reports that she quit smoking about 46 years ago. Her smoking use included cigarettes. She has a 10.00 pack-year smoking history. She has never used smokeless tobacco. She reports that she does not drink alcohol and does not use drugs.  Allergies:  Allergies  Allergen Reactions   Phenobarbital Other (See Comments)  Feeling of uneasiness    Medications Prior to Admission  Medication Sig Dispense Refill   acetaminophen (TYLENOL) 325 MG tablet Take 650 mg by mouth every 6 (six) hours as needed.     aspirin 325 MG tablet Take 325 mg by mouth daily.     atorvastatin (LIPITOR) 40 MG tablet Take 40 mg by mouth at bedtime.  11   B Complex-C-Folic Acid (FOLBEE PLUS) TABS Take 1 tablet by mouth daily.     benazepril (LOTENSIN) 40 MG tablet Take 40 mg by mouth daily.  1   cetirizine (ZYRTEC) 10 MG tablet Take 10 mg by mouth daily as needed.     Cholecalciferol (VITAMIN D) 125 MCG (5000 UT) CAPS Take 5,000 Units by mouth daily.     clobetasol ointment (TEMOVATE) 0.05 % Apply 1 Application topically 2 (two) times a week. 30  g 3   ibuprofen (ADVIL,MOTRIN) 200 MG tablet Take 200 mg by mouth every 6 (six) hours as needed.     LORazepam (ATIVAN) 0.5 MG tablet Take 0.25 mg by mouth as needed for anxiety.     meclizine (ANTIVERT) 25 MG tablet Take 25 mg by mouth 4 (four) times daily as needed for dizziness or nausea.     Multiple Vitamin (MULTIVITAMIN) tablet Take 1 tablet by mouth daily. Centrum silver     omeprazole (PRILOSEC) 40 MG capsule Take 1 capsule (40 mg total) by mouth daily. 90 capsule 3   Polyethyl Glycol-Propyl Glycol 0.4-0.3 % SOLN Place 1 drop into both eyes daily as needed (for dry eyes).     polyethylene glycol (MIRALAX / GLYCOLAX) 17 g packet Take 17 g by mouth daily as needed for mild constipation or moderate constipation. 14 each 0     Physical Exam: Blood pressure (!) 139/56, pulse 72, temperature 98.2 F (36.8 C), temperature source Oral, resp. rate 18, height 5\' 4"  (1.626 m), weight 63.7 kg, SpO2 96 %. General: pleasant, WD/WN female who is laying in bed in NAD HEENT: head is normocephalic, atraumatic.   Heart: regular, rate, and rhythm.   Lungs: CTAB, no wheezes, rhonchi, or rales noted.  Respiratory effort nonlabored Abd: Soft, mild distension but NT on my exam. +BS. No masses, hernias, or organomegaly MS: no BUE or BLE edema Skin: warm and dry  Psych: A&Ox4 with an appropriate affect Neuro: cranial nerves grossly intact, normal speech, thought process intact, moves all extremities, gait not assessed  Results for orders placed or performed during the hospital encounter of 11/01/22 (from the past 48 hour(s))  CBC with Differential     Status: Abnormal   Collection Time: 11/01/22  6:58 PM  Result Value Ref Range   WBC 9.4 4.0 - 10.5 K/uL   RBC 4.71 3.87 - 5.11 MIL/uL   Hemoglobin 13.3 12.0 - 15.0 g/dL   HCT 40.9 81.1 - 91.4 %   MCV 88.3 80.0 - 100.0 fL   MCH 28.2 26.0 - 34.0 pg   MCHC 32.0 30.0 - 36.0 g/dL   RDW 78.2 95.6 - 21.3 %   Platelets 321 150 - 400 K/uL   nRBC 0.0 0.0 - 0.2  %   Neutrophils Relative % 83 %   Neutro Abs 7.8 (H) 1.7 - 7.7 K/uL   Lymphocytes Relative 11 %   Lymphs Abs 1.0 0.7 - 4.0 K/uL   Monocytes Relative 4 %   Monocytes Absolute 0.4 0.1 - 1.0 K/uL   Eosinophils Relative 2 %   Eosinophils Absolute 0.2 0.0 - 0.5 K/uL  Basophils Relative 0 %   Basophils Absolute 0.0 0.0 - 0.1 K/uL   Immature Granulocytes 0 %   Abs Immature Granulocytes 0.03 0.00 - 0.07 K/uL    Comment: Performed at [facility], [address] [place], [place] [address]  Comprehensive metabolic panel     Status: Abnormal   Collection Time: 11/01/22  6:58 PM  Result Value Ref Range   Sodium 138 135 - 145 mmol/L   Potassium 3.9 3.5 - 5.1 mmol/L   Chloride 101 98 - 111 mmol/L   CO2 25 22 - 32 mmol/L   Glucose, Bld 182 (H) 70 - 99 mg/dL    Comment: Glucose reference range applies only to samples taken after fasting for at least 8 hours.   BUN 20 8 - 23 mg/dL   Creatinine, Ser 6.04 0.44 - 1.00 mg/dL   Calcium 9.2 8.9 - 54.0 mg/dL   Total Protein 7.0 6.5 - 8.1 g/dL   Albumin 3.9 3.5 - 5.0 g/dL   AST 22 15 - 41 U/L   ALT 25 0 - 44 U/L   Alkaline Phosphatase 62 38 - 126 U/L   Total Bilirubin 0.6 0.3 - 1.2 mg/dL   GFR, Estimated >98 >11 mL/min    Comment: (NOTE) Calculated using the CKD-EPI Creatinine Equation (2021)    Anion gap 12 5 - 15    Comment: Performed at [facility], [address] [place], [place] [address]  Lipase, blood     Status: None   Collection Time: 11/01/22  6:58 PM  Result Value Ref Range   Lipase 35 11 - 51 U/L    Comment: Performed at [facility], [address] [place], [place] [address]  Basic metabolic panel     Status: Abnormal   Collection Time: 11/02/22  6:11 AM  Result Value Ref Range   Sodium 139 135 - 145 mmol/L   Potassium 3.5 3.5 - 5.1 mmol/L   Chloride 108 98 - 111 mmol/L   CO2 26 22 - 32 mmol/L   Glucose, Bld 131 (H) 70 - 99 mg/dL    Comment: Glucose reference range applies only to samples  taken after fasting for at least 8 hours.   BUN 16 8 - 23 mg/dL   Creatinine, Ser 1.30 0.44 - 1.00 mg/dL   Calcium 8.2 (L) 8.9 - 10.3 mg/dL   GFR, Estimated >86 >57 mL/min    Comment: (NOTE) Calculated using the CKD-EPI Creatinine Equation (2021)    Anion gap 5 5 - 15    Comment: Performed at [facility], [address] [place], [place] [address]  CBC     Status: Abnormal   Collection Time: 11/02/22  6:11 AM  Result Value Ref Range   WBC 7.0 4.0 - 10.5 K/uL   RBC 3.90 3.87 - 5.11 MIL/uL   Hemoglobin 11.1 (L) 12.0 - 15.0 g/dL   HCT 29.5 (L) 28.4 - 13.2 %   MCV 90.5 80.0 - 100.0 fL   MCH 28.5 26.0 - 34.0 pg   MCHC 31.4 30.0 - 36.0 g/dL   RDW 44.0 10.2 - 72.5 %   Platelets 278 150 - 400 K/uL   nRBC 0.0 0.0 - 0.2 %    Comment: Performed at [facility], [address], [place], [place] [address]   CT ABDOMEN PELVIS W CONTRAST  Result Date: 11/01/2022 CLINICAL DATA:  Abdominal pain. EXAM: CT ABDOMEN AND PELVIS WITH CONTRAST  TECHNIQUE: Multidetector CT imaging of the abdomen and pelvis was performed using the standard protocol following bolus administration of intravenous contrast. RADIATION DOSE REDUCTION: This exam was performed according to the departmental dose-optimization program which includes automated exposure control, adjustment of the mA and/or kV according to patient size and/or use of iterative reconstruction technique. CONTRAST:  OMNIPAQUE IOHEXOL 300 MG/ML  SOLN COMPARISON:  CT abdomen pelvis dated 09/24/2022. FINDINGS: Evaluation is limited due to streak artifact caused by patient's arms. Lower chest: The visualized lung bases are clear. There is coronary vascular calcification. No intra-abdominal free air.  Small ascites. Hepatobiliary: The liver is unremarkable. There is mild biliary ductal dilatation versus mild periportal edema. The gallbladder is unremarkable. Pancreas: Unremarkable. No pancreatic ductal dilatation or surrounding  inflammatory changes. Spleen: Normal in size without focal abnormality. Adrenals/Urinary Tract: The adrenal glands are unremarkable. There is no hydronephrosis on either side. There is symmetric enhancement and excretion of contrast by both kidneys. The visualized ureters and the urinary bladder appear unremarkable. Stomach/Bowel: Multiple top normal caliber fluid-filled loops of small bowel measure up to 2.6 cm in diameter. A transition noted in the pelvis anteriorly (coronal 63/6 and axial 72/3). There is mild thickened appearance of the loop of bowel just distal to the transition. Findings consistent with a degree of small-bowel obstruction, possibly secondary to enteritis and thickening of the distal bowel with luminal narrowing. Clinical correlation is recommended. The appendix is normal. Vascular/Lymphatic: Mild aortoiliac atherosclerotic disease. The IVC is unremarkable. No portal venous gas. There is no adenopathy. Reproductive: Hysterectomy.  No adnexal masses. Other: None Musculoskeletal: Pectus excavatum. Degenerative changes of the spine and SI joints. Old healed left pubic bone fractures. No acute osseous pathology. IMPRESSION: 1. Small-bowel obstruction with a transition in the pelvis, possibly secondary to enteritis. 2. Small ascites. 3.  Aortic Atherosclerosis (ICD10-I70.0). Electronically Signed   By: Elgie Collard M.D.   On: 11/01/2022 20:58    Anti-infectives (From admission, onward)    None       Assessment/Plan SBO - CT w/ SBO with transition in the pelvis questioned to be 2/2 to enteritis. Hx robotic assisted hysterectomy in 2010. She also has received radiation for her endometrial cancer. Prior SBO's in 2014 and June 2024 that resolved with conservative management. Her SBO in 2024 was felt either 2/2 adhesions versus radiation changes.  - HDS without fever, tachycardia or systolic hypotension. No peritonitis on exam. WBC . No current indication for emergency surgery - Given  symptom improvement with resolution of n/v I think we can hold off on NGT. If she develops recurrence of n/v would recommend NGT placement.  - Will proceed with SBO protocol orally - Keep K > 4, Mg > 2 and mobilize as able for bowel function - Hopefully patient will improve with conservative management. If patient fails to improve with conservative management, they may require exploratory surgery during admission - Agree with medical admission. We will follow with you.   FEN - NPO/ice chips, IVF per [facility] VTE - SCDs, Lovenox ID - None  I reviewed nursing notes, hospitalist notes, last 24 h vitals and pain scores, last 48 h intake and output, last 24 h labs and trends, and last 24 h imaging results.  Jacinto Halim, [facility] 11/02/2022, 7:56 AM Please see Amion for pager number during day hours 7:00am-4:30pm

## 2022-11-02 NOTE — Assessment & Plan Note (Signed)
-   This is recurrent. - She is admitted to a medical observation bed. - We will keep her n.p.o. except medications. - She will be hydrated with IV normal saline. - We we will optimize her electrolytes. - Will follow two-view abdomen x-ray in AM. - Dr. Magnus Ivan with general surgery was notified about the patient and is aware.

## 2022-11-02 NOTE — Assessment & Plan Note (Signed)
-   We will continue her benazepril.

## 2022-11-02 NOTE — H&P (Signed)
[place]   PATIENT NAME: [name]    MR#:  [id number]  DATE OF BIRTH:  1940-08-24  DATE OF ADMISSION:  11/01/2022  PRIMARY CARE PHYSICIAN: Tisovec, Adelfa Koh, MD   Patient is coming from: Home  REQUESTING/REFERRING PHYSICIAN: Azell Der, DO [facility])  CHIEF COMPLAINT:   Chief Complaint  Patient presents with   Abdominal Pain    HISTORY OF PRESENT ILLNESS:  [name] is a 82 y.o. Caucasian female with medical history significant for BPV, type 2 diabetes mellitus, depression, anxiety, hypertension, dyslipidemia and osteoarthritis, who presented to the emergency room with acute onset of generalized abdominal pain with associated nausea and vomiting since yesterday afternoon.  She had 2 formed bowel movements and later on had a softer one.  No fever or chills.  No bilious vomitus or hematemesis.  No cough or wheezing or dyspnea.  No chest pain or palpitations.  No dysuria, oliguria or hematuria or flank pain.  The patient had similar symptoms about 4 weeks ago.  ED Course: When she came to the ER, BP was 154/76 with otherwise normal vital signs.  Labs revealed blood glucose 182 with otherwise normal vital signs and unremarkable CBC.   EKG as reviewed by me : None Imaging: Abdominal pelvic CT scan revealed small bowel obstruction with transition in the pelvis possibly secondary to enteritis, small ascites and aortic atherosclerosis.  The patient was given 4 mg of IV morphine sulfate, 4 mg of IV Zofran and 1 L bolus of IV normal saline.  She is directly admitted to a medical observation bed for further evaluation and management. PAST MEDICAL HISTORY:   Past Medical History:  Diagnosis Date   Allergy    Anxiety    takes ativan as needed   Arthritis    Benign positional vertigo    Bursitis of shoulder, left    Cancer (HCC)    Endometrial ca/Recurrence   CKD (chronic kidney disease), stage III (HCC)    Depression    Diabetes (HCC)    History of colonic polyps     Hypercholesteremia    Hypercholesterolemia    Hypertension    Lumbar pain    Osteoarthritis    Osteoporosis    S/P radiation therapy July 29, 2010-Sep 06, 2010   External beam of pelvis   S/P radiation therapy 09/15/10, 09/24/10, 09/28/2010   Intracavitary brachytherapy   SBO (small bowel obstruction) (HCC)    Status post chemotherapy    carboplatin/paclitaxel x 6 rounds    PAST SURGICAL HISTORY:   Past Surgical History:  Procedure Laterality Date   ABDOMINAL HYSTERECTOMY     BREAST LUMPECTOMY  1993   Left breast, benign   ROBOTIC ASSISTED LAPAROSCOPIC VAGINAL HYSTERECTOMY WITH FIBROID REMOVAL  July 2010   bilat. salpingo-oophorectomy    SOCIAL HISTORY:   Social History   Tobacco Use   Smoking status: Former    Packs/day: 1.00    Years: 10.00    Additional pack years: 0.00    Total pack years: 10.00    Types: Cigarettes    Quit date: 05/12/1976    Years since quitting: 46.5   Smokeless tobacco: Never  Substance Use Topics   Alcohol use: No    FAMILY HISTORY:   Family History  Problem Relation Age of Onset   Breast cancer Mother    Heart disease Father    Breast cancer Sister    Diabetes Sister    Colon cancer Neg Hx  Esophageal cancer Neg Hx    Rectal cancer Neg Hx    Stomach cancer Neg Hx     DRUG ALLERGIES:   Allergies  Allergen Reactions   Phenobarbital Other (See Comments)    Feeling of uneasiness    REVIEW OF SYSTEMS:   ROS As per history of present illness. All pertinent systems were reviewed above. Constitutional, HEENT, cardiovascular, respiratory, GI, GU, musculoskeletal, neuro, psychiatric, endocrine, integumentary and hematologic systems were reviewed and are otherwise negative/unremarkable except for positive findings mentioned above in the HPI.   MEDICATIONS AT HOME:   Prior to Admission medications   Medication Sig Start Date End Date Taking? Authorizing Provider  acetaminophen (TYLENOL) 325 MG tablet Take 650 mg by mouth every  6 (six) hours as needed.    [provider]  aspirin 325 MG tablet Take 325 mg by mouth daily.    [provider]  atorvastatin (LIPITOR) 40 MG tablet Take 40 mg by mouth at bedtime. 11/05/15   [provider]  B Complex-C-Folic Acid (FOLBEE PLUS) TABS Take 1 tablet by mouth daily. 08/25/22   [provider]  benazepril (LOTENSIN) 40 MG tablet Take 40 mg by mouth daily. 01/06/18   [provider]  cetirizine (ZYRTEC) 10 MG tablet Take 10 mg by mouth daily as needed.    [provider]  Cholecalciferol (VITAMIN D) 125 MCG (5000 UT) CAPS Take 5,000 Units by mouth daily.    [provider]  clobetasol ointment (TEMOVATE) 0.05 % Apply 1 Application topically 2 (two) times a week. 06/30/22   Antionette Char, MD  ibuprofen (ADVIL,MOTRIN) 200 MG tablet Take 200 mg by mouth every 6 (six) hours as needed.    [provider]  LORazepam (ATIVAN) 0.5 MG tablet Take 0.25 mg by mouth as needed for anxiety.    [provider]  meclizine (ANTIVERT) 25 MG tablet Take 25 mg by mouth 4 (four) times daily as needed for dizziness or nausea.    [provider]  Multiple Vitamin (MULTIVITAMIN) tablet Take 1 tablet by mouth daily. Centrum silver    [provider]  omeprazole (PRILOSEC) 40 MG capsule Take 1 capsule (40 mg total) by mouth daily. 11/17/21   Hilarie Fredrickson, MD  Polyethyl Glycol-Propyl Glycol 0.4-0.3 % SOLN Place 1 drop into both eyes daily as needed (for dry eyes).    [provider]  polyethylene glycol (MIRALAX / GLYCOLAX) 17 g packet Take 17 g by mouth daily as needed for mild constipation or moderate constipation. 09/27/22   Hongalgi, Maximino Greenland, MD      VITAL SIGNS:  Blood pressure (!) 139/56, pulse 72, temperature 98.2 F (36.8 C), temperature source Oral, resp. rate 18, height 5\' 4"  (1.626 m), weight 63.7 kg, SpO2 96 %.  PHYSICAL EXAMINATION:  Physical Exam  GENERAL:  82 y.o.-year-old Caucasian  female patient lying in the bed with no acute distress.  EYES: Pupils equal, round, reactive to light and accommodation. No scleral icterus. Extraocular muscles intact.  HEENT: Head atraumatic, normocephalic. Oropharynx and nasopharynx clear.  NECK:  Supple, no jugular venous distention. No thyroid enlargement, no tenderness.  LUNGS: Normal breath sounds bilaterally, no wheezing, rales,rhonchi or crepitation. No use of accessory muscles of respiration.  CARDIOVASCULAR: Regular rate and rhythm, S1, S2 normal. No murmurs, rubs, or gallops.  ABDOMEN: Soft, nondistended, with generalized tenderness without rebound tenderness guarding or rigidity. Bowel sounds are the managed. No organomegaly or mass.  EXTREMITIES: No pedal edema, cyanosis, or clubbing.  NEUROLOGIC: Cranial nerves II through XII are intact. Muscle strength 5/5 in all extremities. Sensation intact. Gait not checked.  PSYCHIATRIC: The patient is alert and oriented x 3.  Normal affect and good eye contact. SKIN: No obvious rash, lesion, or ulcer.   LABORATORY PANEL:   CBC Recent Labs  Lab 11/01/22 1858  WBC 9.4  HGB 13.3  HCT 41.6  PLT 321   ------------------------------------------------------------------------------------------------------------------  Chemistries  Recent Labs  Lab 11/01/22 1858  NA 138  K 3.9  CL 101  CO2 25  GLUCOSE 182*  BUN 20  CREATININE 0.88  CALCIUM 9.2  AST 22  ALT 25  ALKPHOS 62  BILITOT 0.6   ------------------------------------------------------------------------------------------------------------------  Cardiac Enzymes No results for input(s): "TROPONINI" in the last 168 hours. ------------------------------------------------------------------------------------------------------------------  RADIOLOGY:  CT ABDOMEN PELVIS W CONTRAST  Result Date: 11/01/2022 CLINICAL DATA:  Abdominal pain. EXAM: CT ABDOMEN AND PELVIS WITH CONTRAST TECHNIQUE: Multidetector CT imaging of the  abdomen and pelvis was performed using the standard protocol following bolus administration of intravenous contrast. RADIATION DOSE REDUCTION: This exam was performed according to the departmental dose-optimization program which includes automated exposure control, adjustment of the mA and/or kV according to patient size and/or use of iterative reconstruction technique. CONTRAST:  OMNIPAQUE IOHEXOL 300 MG/ML  SOLN COMPARISON:  CT abdomen pelvis dated 09/24/2022. FINDINGS: Evaluation is limited due to streak artifact caused by patient's arms. Lower chest: The visualized lung bases are clear. There is coronary vascular calcification. No intra-abdominal free air.  Small ascites. Hepatobiliary: The liver is unremarkable. There is mild biliary ductal dilatation versus mild periportal edema. The gallbladder is unremarkable. Pancreas: Unremarkable. No pancreatic ductal dilatation or surrounding inflammatory changes. Spleen: Normal in size without focal abnormality. Adrenals/Urinary Tract: The adrenal glands are unremarkable. There is no hydronephrosis on either side. There is symmetric enhancement and excretion of contrast by both kidneys. The visualized ureters and the urinary bladder appear unremarkable. Stomach/Bowel: Multiple top normal caliber fluid-filled loops of small bowel measure up to 2.6 cm in diameter. A transition noted in the pelvis anteriorly (coronal 63/6 and axial 72/3). There is mild thickened appearance of the loop of bowel just distal to the transition. Findings consistent with a degree of small-bowel obstruction, possibly secondary to enteritis and thickening of the distal bowel with luminal narrowing. Clinical correlation is recommended. The appendix is normal. Vascular/Lymphatic: Mild aortoiliac atherosclerotic disease. The IVC is unremarkable. No portal venous gas. There is no adenopathy. Reproductive: Hysterectomy.  No adnexal masses. Other: None Musculoskeletal: Pectus excavatum.  Degenerative changes of the spine and SI joints. Old healed left pubic bone fractures. No acute osseous pathology. IMPRESSION: 1. Small-bowel obstruction with a transition in the pelvis, possibly secondary to enteritis. 2. Small ascites. 3.  Aortic Atherosclerosis (ICD10-I70.0). Electronically Signed   By: Elgie Collard M.D.   On: 11/01/2022 20:58      IMPRESSION AND PLAN:  Assessment and Plan: * SBO (small bowel obstruction) (HCC) - This is recurrent. - She is admitted to a medical observation bed. - We will keep her n.p.o. except medications. - She will be hydrated with IV normal saline. - We we will optimize her electrolytes. - Will follow two-view abdomen x-ray in AM. - Dr. Magnus Ivan with general surgery was notified about the patient and is aware.  Essential hypertension - We will continue her benazepril.  Dyslipidemia - We will continue statin therapy.  Type 2 diabetes mellitus without complications (HCC) - The patient will be placed on supplemental coverage with NovoLog.  GERD without esophagitis - We will continue PPI therapy.   DVT prophylaxis: Lovenox.  Advanced Care Planning:  Code Status: The patient is DNR only.  She agrees to be intubated if she has respiratory arrest without cardiac arrest..  Family Communication:  The plan of care was discussed in details with the patient (and family). I answered all questions. The patient agreed to proceed with the above mentioned plan. Further management will depend upon hospital course. Disposition Plan: Back to previous home environment Consults called: General surgery. All the records are reviewed and case discussed with ED provider.  Status is: Observation high frequency of surveillance required.  I certify that at the time of admission, it is my clinical judgment that the patient will require hospital care extending less than 2 midnights.                            Dispo: The patient is from: Home               Anticipated d/c is to: Home              Patient currently is not medically stable to d/c.              Difficult to place patient: No  Hannah Beat M.D on 11/02/2022 at 4:57 AM  [facility]   From 7 PM-7 AM, contact night-coverage [email or web address]  CC: Primary care physician; Tisovec, Adelfa Koh, MD

## 2022-11-02 NOTE — Assessment & Plan Note (Signed)
-   We will continue statin therapy. 

## 2022-11-03 DIAGNOSIS — E119 Type 2 diabetes mellitus without complications: Secondary | ICD-10-CM | POA: Diagnosis not present

## 2022-11-03 DIAGNOSIS — K219 Gastro-esophageal reflux disease without esophagitis: Secondary | ICD-10-CM

## 2022-11-03 DIAGNOSIS — I10 Essential (primary) hypertension: Secondary | ICD-10-CM | POA: Diagnosis not present

## 2022-11-03 DIAGNOSIS — K56609 Unspecified intestinal obstruction, unspecified as to partial versus complete obstruction: Secondary | ICD-10-CM | POA: Diagnosis not present

## 2022-11-03 LAB — GLUCOSE, CAPILLARY
Glucose-Capillary: 106 mg/dL — ABNORMAL HIGH (ref 70–99)
Glucose-Capillary: 103 mg/dL — ABNORMAL HIGH (ref 70–99)

## 2022-11-03 NOTE — Progress Notes (Signed)
Subjective: CC: No abdominal pain this am. Tolerating sips with meds + ice chips. No n/v. Passing flatus. Several liquid bm's in the last 24 hours.   Objective: Vital signs in last 24 hours: Temp:  [98.2 F (36.8 C)-98.6 F (37 C)] 98.5 F (36.9 C) (07/11 0631) Pulse Rate:  [64-66] 64 (07/11 0631) Resp:  [18] 18 (07/11 0631) BP: (135-152)/(56-63) 152/56 (07/11 0631) SpO2:  [94 %-99 %] 97 % (07/11 0631) Last BM Date : 11/02/22  Intake/Output from previous day: 07/10 0701 - 07/11 0700 In: 1645 [I.V.:1645] Out: -  Intake/Output this shift: No intake/output data recorded.  PE: Gen:  Alert, NAD, pleasant Abd: Soft, ND, NT, +BS  Lab Results:  Recent Labs    11/01/22 1858 11/02/22 0611  WBC 9.4 7.0  HGB 13.3 11.1*  HCT 41.6 35.3*  PLT 321 278   BMET Recent Labs    11/01/22 1858 11/02/22 0611  NA 138 139  K 3.9 3.5  CL 101 108  CO2 25 26  GLUCOSE 182* 131*  BUN 20 16  CREATININE 0.88 0.78  CALCIUM 9.2 8.2*   PT/INR No results for input(s): "LABPROT", "INR" in the last 72 hours. CMP     Component Value Date/Time   NA 139 11/02/2022 0611   K 3.5 11/02/2022 0611   CL 108 11/02/2022 0611   CO2 26 11/02/2022 0611   GLUCOSE 131 (H) 11/02/2022 0611   BUN 16 11/02/2022 0611   CREATININE 0.78 11/02/2022 0611   CREATININE 1.08 (H) 11/22/2021 1042   CALCIUM 8.2 (L) 11/02/2022 0611   PROT 7.0 11/01/2022 1858   ALBUMIN 3.9 11/01/2022 1858   AST 22 11/01/2022 1858   AST 20 11/22/2021 1042   ALT 25 11/01/2022 1858   ALT 20 11/22/2021 1042   ALKPHOS 62 11/01/2022 1858   BILITOT 0.6 11/01/2022 1858   BILITOT 0.3 11/22/2021 1042   GFRNONAA >60 11/02/2022 0611   GFRNONAA 52 (L) 11/22/2021 1042   GFRAA >60 03/28/2019 0838   Lipase     Component Value Date/Time   LIPASE 35 11/01/2022 1858    Studies/Results: DG Abd Portable 1V-Small Bowel Obstruction Protocol-initial, 8 hr delay  Result Date: 11/02/2022 CLINICAL DATA:  Small-bowel obstruction  follow-up EXAM: PORTABLE ABDOMEN - 1 VIEW COMPARISON:  November 02, 2022 FINDINGS: Enteric contrast has progressed to the rectum. No dilated loops of bowel are seen. Degenerative changes of the lumbar spine. IMPRESSION: Enteric contrast has progressed to the rectum. Electronically Signed   By: Meda Klinefelter M.D.   On: 11/02/2022 20:14   DG Abd 2 Views  Result Date: 11/02/2022 CLINICAL DATA:  Follow up small bowel obstruction EXAM: ABDOMEN - 2 VIEW COMPARISON:  11/01/2022 CT FINDINGS: Scattered large and small bowel gas is noted. The degree of small-bowel dilatation has improved in the interval from the prior exam. Chronic scoliosis of the lumbar spine is noted. Healed left pubic rami fractures are noted. IMPRESSION: Resolution of previously seen small bowel dilatation. Electronically Signed   By: Alcide Clever M.D.   On: 11/02/2022 12:10   CT ABDOMEN PELVIS W CONTRAST  Result Date: 11/01/2022 CLINICAL DATA:  Abdominal pain. EXAM: CT ABDOMEN AND PELVIS WITH CONTRAST TECHNIQUE: Multidetector CT imaging of the abdomen and pelvis was performed using the standard protocol following bolus administration of intravenous contrast. RADIATION DOSE REDUCTION: This exam was performed according to the departmental dose-optimization program which includes automated exposure control, adjustment of the mA and/or kV according to patient  size and/or use of iterative reconstruction technique. CONTRAST:  OMNIPAQUE IOHEXOL 300 MG/ML  SOLN COMPARISON:  CT abdomen pelvis dated 09/24/2022. FINDINGS: Evaluation is limited due to streak artifact caused by patient's arms. Lower chest: The visualized lung bases are clear. There is coronary vascular calcification. No intra-abdominal free air.  Small ascites. Hepatobiliary: The liver is unremarkable. There is mild biliary ductal dilatation versus mild periportal edema. The gallbladder is unremarkable. Pancreas: Unremarkable. No pancreatic ductal dilatation or surrounding inflammatory  changes. Spleen: Normal in size without focal abnormality. Adrenals/Urinary Tract: The adrenal glands are unremarkable. There is no hydronephrosis on either side. There is symmetric enhancement and excretion of contrast by both kidneys. The visualized ureters and the urinary bladder appear unremarkable. Stomach/Bowel: Multiple top normal caliber fluid-filled loops of small bowel measure up to 2.6 cm in diameter. A transition noted in the pelvis anteriorly (coronal 63/6 and axial 72/3). There is mild thickened appearance of the loop of bowel just distal to the transition. Findings consistent with a degree of small-bowel obstruction, possibly secondary to enteritis and thickening of the distal bowel with luminal narrowing. Clinical correlation is recommended. The appendix is normal. Vascular/Lymphatic: Mild aortoiliac atherosclerotic disease. The IVC is unremarkable. No portal venous gas. There is no adenopathy. Reproductive: Hysterectomy.  No adnexal masses. Other: None Musculoskeletal: Pectus excavatum. Degenerative changes of the spine and SI joints. Old healed left pubic bone fractures. No acute osseous pathology. IMPRESSION: 1. Small-bowel obstruction with a transition in the pelvis, possibly secondary to enteritis. 2. Small ascites. 3.  Aortic Atherosclerosis (ICD10-I70.0). Electronically Signed   By: Elgie Collard M.D.   On: 11/01/2022 20:58    Anti-infectives: Anti-infectives (From admission, onward)    None        Assessment/Plan SBO - CT w/ SBO with transition in the pelvis questioned to be 2/2 to enteritis. Hx robotic assisted hysterectomy in 2010. She also has received radiation for her endometrial cancer. Prior SBO's in 2014 and June 2024 that resolved with conservative management. Her SBO in 2024 was felt either 2/2 adhesions versus radiation changes.  - Appears to be clinically and radiographically resolving. Patient with contrast in colon on xray w/ no dilated small bowel loops;  resolution of her symptoms and return of bowel function. Will adv to FLD. If she tolerates FLD she is okay for d/c from our standpoint and can adv to soft diet over the next few days at home.    FEN - FLD, IVF per [facility] VTE - SCDs, Lovenox ID - None. Afebrile. WBC wnl.   I reviewed nursing notes, last 24 h vitals and pain scores, last 48 h intake and output, last 24 h labs and trends, and last 24 h imaging results.     LOS: 1 day    Jacinto Halim , [facility] 11/03/2022, 9:24 AM Please see Amion for pager number during day hours 7:00am-4:30pm

## 2022-11-03 NOTE — Care Plan (Signed)
Discharge instructions given. Pt A&O, VSS. IV removed. Ready for discharge.

## 2022-11-03 NOTE — TOC Progression Note (Signed)
Transition of Care [facility]) - Progression Note    Patient Details  Name: [name] MRN: [id number] Date of Birth: 03/14/1941  [facility]) CM/SW Contact  Coralyn Helling, [place] Phone Number: 11/03/2022, 10:13 AM  Clinical Narrative:     Transition of Care [facility]) - Inpatient Brief Assessment   Patient Details  Name: [name] MRN: [id number] Date of Birth: 1940/08/12  Transition of Care [facility]) CM/SW Contact:    Coralyn Helling, LCSW Phone Number: 11/03/2022, 10:13 AM   Clinical Narrative:  No needs.   Transition of Care Asessment: Insurance and Status: Insurance coverage has been reviewed Patient has primary care physician: Yes Home environment has been reviewed: yes Prior level of function:: Uses walker at baseline Prior/Current Home Services: No current home services Social Determinants of Health Reivew: SDOH reviewed no interventions necessary Readmission risk has been reviewed: Yes Transition of care needs: no transition of care needs at this time        Expected Discharge Plan and Services                                               Social Determinants of Health (SDOH) Interventions SDOH Screenings   Food Insecurity: No Food Insecurity (11/02/2022)  Housing: Low Risk  (11/02/2022)  Transportation Needs: No Transportation Needs (11/02/2022)  Utilities: Not At Risk (11/02/2022)  Tobacco Use: Medium Risk (11/01/2022)    Readmission Risk Interventions    11/03/2022   10:11 AM 11/03/2022   10:00 AM  Readmission Risk Prevention Plan  Transportation Screening  Complete  HRI or Home Care Consult Complete   Social Work Consult for Recovery Care Planning/Counseling Complete   Palliative Care Screening Not Applicable

## 2022-11-03 NOTE — Discharge Summary (Signed)
Physician Discharge Summary   Patient: [name] MRN: [id number] DOB: 03-09-1941  Admit date:     11/01/2022  Discharge date: 11/03/2022  Discharge Physician: Kathlen Mody   PCP: Gaspar Garbe, MD   Recommendations at discharge:  Please follow up with surgery as needed.  Advance diet slowly.  Please follow up with PCP in one week.   Discharge Diagnoses: Principal Problem:   SBO (small bowel obstruction) (HCC) Active Problems:   Essential hypertension   Dyslipidemia   Type 2 diabetes mellitus without complications (HCC)   GERD without esophagitis    Hospital Course: 82 y.o. Caucasian female with medical history significant for BPV, type 2 diabetes mellitus, depression, anxiety, hypertension, dyslipidemia and osteoarthritis, who presented to the emergency room with acute onset of generalized abdominal pain with associated nausea and vomiting since yesterday afternoon   Assessment and Plan:   Recurrent SBO - conservative measures with NPO, ambulation, anti emetics.  - gen surgery on board.   Her symptoms of nausea, abd pain have resolve.d  She was started on clears and advanced as tolerated to full liquids . Gen surgery cleared for discharge as she is able to tolerate full liquids .   Hypertension:  Well controlled.    Type 2 DM CBG (last 3)  Recent Labs    11/02/22 2158 11/03/22 0812 11/03/22 1139  GLUCAP 97 106* 103*      Anxiety and depression Resume home meds.   GERD Stable.          Estimated body mass index is 24.11 kg/m as calculated from the following:   Height as of this encounter: 5\' 4"  (1.626 m).   Weight as of this encounter: 63.7 kg.     Consultants: general surgery.  Procedures performed: none.  Disposition: Home Diet recommendation:  Regular diet DISCHARGE MEDICATION: Allergies as of 11/03/2022       Reactions   Phenobarbital Other (See Comments)   Feeling of uneasiness        Medication List     TAKE these  medications    aspirin 325 MG tablet Take 325 mg by mouth daily.   atorvastatin 40 MG tablet Commonly known as: LIPITOR Take 40 mg by mouth at bedtime.   benazepril 40 MG tablet Commonly known as: LOTENSIN Take 40 mg by mouth daily.   cetirizine 10 MG tablet Commonly known as: ZYRTEC Take 10 mg by mouth daily as needed.   clobetasol ointment 0.05 % Commonly known as: TEMOVATE Apply 1 Application topically 2 (two) times a week.   Folbee Plus Tabs Take 1 tablet by mouth daily.   LORazepam 0.5 MG tablet Commonly known as: ATIVAN Take 0.25 mg by mouth as needed for anxiety.   meclizine 25 MG tablet Commonly known as: ANTIVERT Take 12.5 mg by mouth 2 (two) times daily.   multivitamin tablet Take 1 tablet by mouth daily.   omeprazole 40 MG capsule Commonly known as: PRILOSEC Take 1 capsule (40 mg total) by mouth daily.   Polyethyl Glycol-Propyl Glycol 0.4-0.3 % Soln Place 1 drop into both eyes daily as needed (for dry eyes).   polyethylene glycol 17 g packet Commonly known as: MIRALAX / GLYCOLAX Take 17 g by mouth daily as needed for mild constipation or moderate constipation.   Vitamin D 125 MCG (5000 UT) Caps Take 5,000 Units by mouth daily.        Discharge Exam: Filed Weights   11/01/22 1903 11/02/22 0106  Weight: 66.7 kg 63.7 kg  General exam: Appears calm and comfortable  Respiratory system: Clear to auscultation. Respiratory effort normal. Cardiovascular system: S1 & S2 heard, RRR. No JVD, murmurs, rubs, gallops or clicks. No pedal edema. Gastrointestinal system: Abdomen is nondistended, soft and nontender. No organomegaly or masses felt. Normal bowel sounds heard. Central nervous system: Alert and oriented. No focal neurological deficits. Extremities: Symmetric 5 x 5 power. Skin: No rashes, lesions or ulcers Psychiatry: Judgement and insight appear normal. Mood & affect appropriate.    Condition at discharge: fair  The results of significant  diagnostics from this hospitalization (including imaging, microbiology, ancillary and laboratory) are listed below for reference.   Imaging Studies: DG Abd Portable 1V-Small Bowel Obstruction Protocol-initial, 8 hr delay  Result Date: 11/02/2022 CLINICAL DATA:  Small-bowel obstruction follow-up EXAM: PORTABLE ABDOMEN - 1 VIEW COMPARISON:  November 02, 2022 FINDINGS: Enteric contrast has progressed to the rectum. No dilated loops of bowel are seen. Degenerative changes of the lumbar spine. IMPRESSION: Enteric contrast has progressed to the rectum. Electronically Signed   By: Meda Klinefelter M.D.   On: 11/02/2022 20:14   DG Abd 2 Views  Result Date: 11/02/2022 CLINICAL DATA:  Follow up small bowel obstruction EXAM: ABDOMEN - 2 VIEW COMPARISON:  11/01/2022 CT FINDINGS: Scattered large and small bowel gas is noted. The degree of small-bowel dilatation has improved in the interval from the prior exam. Chronic scoliosis of the lumbar spine is noted. Healed left pubic rami fractures are noted. IMPRESSION: Resolution of previously seen small bowel dilatation. Electronically Signed   By: Alcide Clever M.D.   On: 11/02/2022 12:10   CT ABDOMEN PELVIS W CONTRAST  Result Date: 11/01/2022 CLINICAL DATA:  Abdominal pain. EXAM: CT ABDOMEN AND PELVIS WITH CONTRAST TECHNIQUE: Multidetector CT imaging of the abdomen and pelvis was performed using the standard protocol following bolus administration of intravenous contrast. RADIATION DOSE REDUCTION: This exam was performed according to the departmental dose-optimization program which includes automated exposure control, adjustment of the mA and/or kV according to patient size and/or use of iterative reconstruction technique. CONTRAST:  OMNIPAQUE IOHEXOL 300 MG/ML  SOLN COMPARISON:  CT abdomen pelvis dated 09/24/2022. FINDINGS: Evaluation is limited due to streak artifact caused by patient's arms. Lower chest: The visualized lung bases are clear. There is coronary  vascular calcification. No intra-abdominal free air.  Small ascites. Hepatobiliary: The liver is unremarkable. There is mild biliary ductal dilatation versus mild periportal edema. The gallbladder is unremarkable. Pancreas: Unremarkable. No pancreatic ductal dilatation or surrounding inflammatory changes. Spleen: Normal in size without focal abnormality. Adrenals/Urinary Tract: The adrenal glands are unremarkable. There is no hydronephrosis on either side. There is symmetric enhancement and excretion of contrast by both kidneys. The visualized ureters and the urinary bladder appear unremarkable. Stomach/Bowel: Multiple top normal caliber fluid-filled loops of small bowel measure up to 2.6 cm in diameter. A transition noted in the pelvis anteriorly (coronal 63/6 and axial 72/3). There is mild thickened appearance of the loop of bowel just distal to the transition. Findings consistent with a degree of small-bowel obstruction, possibly secondary to enteritis and thickening of the distal bowel with luminal narrowing. Clinical correlation is recommended. The appendix is normal. Vascular/Lymphatic: Mild aortoiliac atherosclerotic disease. The IVC is unremarkable. No portal venous gas. There is no adenopathy. Reproductive: Hysterectomy.  No adnexal masses. Other: None Musculoskeletal: Pectus excavatum. Degenerative changes of the spine and SI joints. Old healed left pubic bone fractures. No acute osseous pathology. IMPRESSION: 1. Small-bowel obstruction with a transition in the pelvis,  possibly secondary to enteritis. 2. Small ascites. 3.  Aortic Atherosclerosis (ICD10-I70.0). Electronically Signed   By: Elgie Collard M.D.   On: 11/01/2022 20:58    Microbiology: Results for orders placed or performed in visit on 06/29/22  Culture, Urine     Status: Abnormal   Collection Time: 06/29/22  2:48 PM   Specimen: Urine, Clean Catch  Result Value Ref Range Status   Specimen Description   Final    URINE, CLEAN  CATCH Performed at [facility], [address] [place], [place] [address]    Special Requests   Final    NONE Performed at [facility], [address] [place], [place] [address]    Culture (A)  Final    <10,000 COLONIES/mL INSIGNIFICANT GROWTH Performed at [facility], [address], [place], [place] [address]    Report Status 06/30/2022 FINAL  Final    Labs: CBC: Recent Labs  Lab 11/01/22 1858 11/02/22 0611  WBC 9.4 7.0  NEUTROABS 7.8*  --   HGB 13.3 11.1*  HCT 41.6 35.3*  MCV 88.3 90.5  PLT 321 278   Basic Metabolic Panel: Recent Labs  Lab 11/01/22 1858 11/02/22 0611  NA 138 139  K 3.9 3.5  CL 101 108  CO2 25 26  GLUCOSE 182* 131*  BUN 20 16  CREATININE 0.88 0.78  CALCIUM 9.2 8.2*   Liver Function Tests: Recent Labs  Lab 11/01/22 1858  AST 22  ALT 25  ALKPHOS 62  BILITOT 0.6  PROT 7.0  ALBUMIN 3.9   CBG: Recent Labs  Lab 11/02/22 1141 11/02/22 1801 11/02/22 2158 11/03/22 0812 11/03/22 1139  GLUCAP 132* 104* 97 106* 103*    Discharge time spent: 35 minutes   Signed: Kathlen Mody, MD [facility] 11/03/2022

## 2022-11-08 ENCOUNTER — Ambulatory Visit: Payer: Medicare Other

## 2022-11-08 DIAGNOSIS — M6281 Muscle weakness (generalized): Secondary | ICD-10-CM | POA: Diagnosis not present

## 2022-11-08 DIAGNOSIS — R2681 Unsteadiness on feet: Secondary | ICD-10-CM

## 2022-11-08 NOTE — Therapy (Signed)
OUTPATIENT PHYSICAL THERAPY LOWER EXTREMITY TREATMENT   Patient Name: [name] MRN: [id number] DOB:October 27, 1940, 82 y.o., female Today's Date: 11/08/2022  END OF SESSION:  PT End of Session - 11/08/22 1031     Visit Number 6    Number of Visits 16    Date for PT Re-Evaluation 11/28/22    Authorization Type Medicare + [employer]    PT Start Time 1022   pt late   PT Stop Time 1100    PT Time Calculation (min) 38 min    Activity Tolerance Patient tolerated treatment well    Behavior During Therapy WFL for tasks assessed/performed                Past Medical History:  Diagnosis Date   Allergy    Anxiety    takes ativan as needed   Arthritis    Benign positional vertigo    Bursitis of shoulder, left    Cancer (HCC)    Endometrial ca/Recurrence   CKD (chronic kidney disease), stage III (HCC)    Depression    Diabetes (HCC)    History of colonic polyps    Hypercholesteremia    Hypercholesterolemia    Hypertension    Lumbar pain    Osteoarthritis    Osteoporosis    S/P radiation therapy July 29, 2010-Sep 06, 2010   External beam of pelvis   S/P radiation therapy 09/15/10, 09/24/10, 09/28/2010   Intracavitary brachytherapy   SBO (small bowel obstruction) (HCC)    Status post chemotherapy    carboplatin/paclitaxel x 6 rounds   Past Surgical History:  Procedure Laterality Date   ABDOMINAL HYSTERECTOMY     BREAST LUMPECTOMY  1993   Left breast, benign   ROBOTIC ASSISTED LAPAROSCOPIC VAGINAL HYSTERECTOMY WITH FIBROID REMOVAL  July 2010   bilat. salpingo-oophorectomy   Patient Active Problem List   Diagnosis Date Noted   GERD without esophagitis 11/02/2022   Type 2 diabetes mellitus without complications (HCC) 11/02/2022   Abdominal pain 09/25/2022   Dehydration 09/25/2022   Acute prerenal azotemia 09/25/2022   Leukocytosis 09/25/2022   DM2 (diabetes mellitus, type 2) (HCC) 09/25/2022   Dyslipidemia 09/25/2022   GAD (generalized anxiety disorder)  09/25/2022   GERD (gastroesophageal reflux disease) 09/25/2022   IDA (iron deficiency anemia) 11/22/2021   Osteoporosis 08/30/2021   Acute pain of right shoulder 09/30/2019   Myofascial pain 09/18/2019   Cervical radiculopathy 09/12/2019   Essential hypertension 05/18/2013   SBO (small bowel obstruction) (HCC) 05/17/2013   Small bowel obstruction (HCC) 05/16/2013   Malignant neoplasm of corpus uteri, except isthmus (HCC) 05/16/2011    PCP: Gaspar Garbe, MD   REFERRING PROVIDER: Elease Etienne, MD   REFERRING DIAG: [phone] (ICD-10-CM) - SBO (small bowel obstruction) (HCC)   THERAPY DIAG:  Muscle weakness (generalized)  Unsteadiness on feet  Rationale for Evaluation and Treatment: Rehabilitation  ONSET DATE: Hospital Admission 09/24/2022-09/27/2022  SUBJECTIVE:   SUBJECTIVE STATEMENT: Pt reports last Tuesday night wasn't feeling good, went [place] and they found small bowel obstruction, was D/C 2 days later, reports being on soft diet and given Mirilax as needed.  PERTINENT HISTORY: Patient is an 82 y/o female admitted 09/24/22 with abdominal pain due to SBO. PMH positive for endometrial cancer s/p robotic laparoscopic vaginal hysterectomy and BSO, XRT and chemo, CKD, prior SBO, HLD, DM, HTN.  From Acute Care PT" Patient presents with decreased mobility due to generalized weakness, decreased balance, history of fall on stairs at home last  August, decreased activity tolerance and decreased sensation. She was previously independent, though sedentary, but working from home living with her daughter who works during the day. Currently minguard for hallway ambulation without device noting some veering and imbalance and decreased ankle DF with some "head fullness" per pt from change in weather takes Meclizine at home daily. Given limited sensory systems for balance and new weakness from being NPO and decreased strength and sensation in her feet feel she will benefit from follow up  outpatient PT for balance."  PAIN:  Are you having pain? No has history of neck pain but not concerned with it, able to manage it.   PRECAUTIONS: Fall  WEIGHT BEARING RESTRICTIONS: No  FALLS:  Has patient fallen in last 6 months?  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   PATIENT GOALS: get stronger  NEXT MD VISIT: 10/04/2022  OBJECTIVE:   DIAGNOSTIC FINDINGS: NA  PATIENT SURVEYS:  ABC scale 1130/1600 = 70.6%  COGNITION: Overall cognitive status: Within functional limits for tasks assessed     SENSATION: Slightly decreased sensation bottoms of feet.   POSTURE: rounded shoulders and forward head  LOWER EXTREMITY MMT:  MMT Right eval Left eval  Hip flexion 4 4  Hip extension 4 4  Hip abduction 4 4  Hip adduction 4+ 4+  Hip internal rotation    Hip external rotation    Knee flexion 5 5  Knee extension 4 4  Ankle dorsiflexion 5 5  Ankle plantarflexion 5 5  Ankle inversion    Ankle eversion     (Blank rows = not tested)   FUNCTIONAL TESTS:  5 times sit to stand: 29 seconds 6 minute walk test: 600' Berg Balance Scale: 49/56 Dynamic Gait Index: TBD MCTSIB: Condition 1: Avg of 3 trials: 30 sec, Condition 2: Avg of 3 trials: 30 sec, Condition 3: Avg of 3 trials: 30 sec, Condition 4: Avg of 3 trials: 30 sec, and Total Score: 120/120 increased sway on condition 4.   GAIT: Distance walked: 35' Assistive device utilized: None Level of assistance: Complete Independence Comments: visually slow gait speed.     TODAY'S TREATMENT:                                                                                                                              DATE:  11/08/22 Therapeutic Activity: DGI: 19/24  Therapeutic Exercise: to improve strength and mobility.  Demo, verbal and tactile cues throughout for technique.  Nustep L5 x 5 min  Fwd step up and over  blue TB on floor x 10 bil Modified lunge x 10 bil side against wall  Gait Training: 180 ft supervision cues to increase step length, heel toe pattern, arm swing  10/25/2022 Therapeutic Exercise: to improve strength and mobility.  Demo, verbal and tactile cues throughout for technique.  Nustep L5 x 6 min  Neuromuscular Reeducation: to improve balance and stability. SBA for safety throughout.  Gait in hallway - with head turns horizontal 3 x 100', nods x 50' - decreased gait speed with both Side stepping x 25' bil Tandem gait x 25 forward and back - 1 hand on wall  Stepping over 1/2 foam roller - forward 2 x 10 bil  Stepping over 1/2 foam roller - side stepping 2 x 10 bil - difficulty with foot placement with R foot, appears to using hip flexors rather than glutes Toe taps on foam yoga block - 2 x 10 bil (on long side) - mirror for posture  10/20/22 Therapeutic Exercise: to improve strength and mobility.  Demo, verbal and tactile cues throughout for technique. Nustep L4x71min 18/24 on DGI Seated lumbar flexion stretch with orange pball 3x15 sec Seated scap retraction 10x3" Standing rows RTB x 10  Standing shoulder ext RTB x 10  Runner stretch x 30 for hip flexors bil Clock balance x 5 R/L postural sway bil when posteriorly  Step ups with no UE support x 5 bil  10/18/2022 Therapeutic Exercise: to improve strength and mobility.  Demo, verbal and tactile cues throughout for technique. Gait x 600' for warm-up  Reviewed OTAGO program booklet to improve balance and safety including: Head movements (rotation x 5) Neck movements (chin tucks x  5) Back Extension x 5  Strengthening: Trunk movements (trunk rotation with hands on hips x 5 each side) Ankle movements (seated ankle pumps x 10 each foot) Front knee strengthening (seated LAQ  x 10 with 2#ankle weights) Back knee strengthening (standing hamstring curls with 2# ankle weights, x 10 bil both hands on counter) Side hip strengthening (hip abduction with 2# ankle weights, x 10 bil, hands on counter) Calf raises x 20 with support 2# ankle weights Toe raises x 20 with support Balance: Knee bends with support x 10 Backwards walking with support 4 x 10 steps Walking and turning around (figure 8 pattern)  Sideways walking 2 x 10 steps each direction  Heel toe standing x 10 sec each side with support Heel toe walking 4 x 10 steps with support One leg stand with support x 10 sec each side Heel walking with support 4 x 10 steps  Toe walking with support 4 x 10 steps Heel toe backwards walking 4 x 10 steps Sit to stands without support x 10    PATIENT EDUCATION:  Education details: see self care.  Person educated: Patient Education method: Explanation Education comprehension: verbalized understanding  HOME EXERCISE PROGRAM: OTAGO program  ASSESSMENT:  CLINICAL IMPRESSION: Pt was admitted to hospital last week for bowel obstruction, she subjectively was D/C after 2 days and on soft diet foods. Reassessed DGI with patient showing 1 point of improvement which allows her to meet LTG 4. Reviewed gait training to emphasize increased step length, heel-toe pattern, upright posture and arm swing. Worked on strengthening and balance exercises to assist with increasing step length and muscle activation during gait. Overall patient responds well but shows need to continue correcting gait pattern and working on balance activities to improve function. Cheronda Breeding continues to demonstrate potential for improvement and would benefit from  continued skilled therapy to address impairments.      OBJECTIVE IMPAIRMENTS: decreased activity tolerance, decreased balance, decreased endurance, decreased mobility, and decreased strength.   ACTIVITY LIMITATIONS: locomotion level  PARTICIPATION LIMITATIONS: community activity  PERSONAL FACTORS: Age and 3+ comorbidities: history endometrial cancer s/p hysterectomy, CKD,  SBO, HLD, DM, HTN  are also affecting patient's functional outcome.   REHAB POTENTIAL: Good  CLINICAL DECISION MAKING: Evolving/moderate complexity  EVALUATION COMPLEXITY: Moderate   GOALS: Goals reviewed with patient? Yes  SHORT TERM GOALS: Target date: 10/17/2022   Patient will be independent with initial HEP. Baseline:  Goal status: MET 10/18/22- was on vacation so only tried 1x. 10/25/22- met  LONG TERM GOALS: Target date: 11/28/2022   Patient will be independent with advanced/ongoing HEP to improve outcomes and carryover.  Baseline:  Goal status: IN PROGRESS  2.  Patient will be able to ambulate 900' in 6 minutes to demonstrate ability to ambulate in community.   Baseline: 600' Goal status: IN PROGRESS  3.  Patient will demonstrate improved functional LE strength as demonstrated by 5x STS < 20 seconds. Baseline: 29 seconds Goal status: IN PROGRESS  4.  Patient will demonstrate at least 19/24 on DGI to improve gait stability and reduce risk for falls. Baseline: NT Goal status: MET- 19/24 11/08/22  5.  Patient will report 19 points improvement on ABC scale to demonstrate improved functional ability. Baseline: 1130/1600 Goal status: IN PROGRESS  6.  Patient be able to maintain tandem stance x 30 sec bil to demonstrate decreased risk of falls.  Baseline: 15 seconds bil  Goal status: IN PROGRESS  PLAN:  PT FREQUENCY: 1-2x/week  PT DURATION: 8 weeks  PLANNED INTERVENTIONS: Therapeutic exercises, Therapeutic activity, Neuromuscular re-education, Balance training, Gait training, Patient/Family education, Self Care, Joint mobilization, Stair training,  Prosthetic training, Aquatic Therapy, Cryotherapy, Moist heat, Manual therapy, and Re-evaluation  PLAN FOR NEXT SESSION: review gait training; exercises to help increase stride length, arm swing, and posture; continue balance and LE strengthening, has OTAGO program for [employer], PTA 11/08/2022, 11:22 AM

## 2022-11-09 DIAGNOSIS — E1129 Type 2 diabetes mellitus with other diabetic kidney complication: Secondary | ICD-10-CM | POA: Diagnosis not present

## 2022-11-09 DIAGNOSIS — K56609 Unspecified intestinal obstruction, unspecified as to partial versus complete obstruction: Secondary | ICD-10-CM | POA: Diagnosis not present

## 2022-11-09 DIAGNOSIS — E7800 Pure hypercholesterolemia, unspecified: Secondary | ICD-10-CM | POA: Diagnosis not present

## 2022-11-09 DIAGNOSIS — D72829 Elevated white blood cell count, unspecified: Secondary | ICD-10-CM | POA: Diagnosis not present

## 2022-11-09 DIAGNOSIS — R109 Unspecified abdominal pain: Secondary | ICD-10-CM | POA: Diagnosis not present

## 2022-11-09 DIAGNOSIS — E860 Dehydration: Secondary | ICD-10-CM | POA: Diagnosis not present

## 2022-11-09 DIAGNOSIS — K219 Gastro-esophageal reflux disease without esophagitis: Secondary | ICD-10-CM | POA: Diagnosis not present

## 2022-11-09 DIAGNOSIS — I129 Hypertensive chronic kidney disease with stage 1 through stage 4 chronic kidney disease, or unspecified chronic kidney disease: Secondary | ICD-10-CM | POA: Diagnosis not present

## 2022-11-09 DIAGNOSIS — N1831 Chronic kidney disease, stage 3a: Secondary | ICD-10-CM | POA: Diagnosis not present

## 2022-11-09 DIAGNOSIS — F418 Other specified anxiety disorders: Secondary | ICD-10-CM | POA: Diagnosis not present

## 2022-11-11 ENCOUNTER — Encounter: Payer: Self-pay | Admitting: Physical Therapy

## 2022-11-11 ENCOUNTER — Ambulatory Visit: Payer: Medicare Other | Admitting: Physical Therapy

## 2022-11-11 DIAGNOSIS — M6281 Muscle weakness (generalized): Secondary | ICD-10-CM | POA: Diagnosis not present

## 2022-11-11 DIAGNOSIS — R2681 Unsteadiness on feet: Secondary | ICD-10-CM

## 2022-11-11 NOTE — Therapy (Signed)
OUTPATIENT PHYSICAL THERAPY LOWER EXTREMITY TREATMENT   Patient Name: [name] MRN: [id number] DOB:11/14/1940, 82 y.o., female Today's Date: 11/11/2022  END OF SESSION:  PT End of Session - 11/11/22 1019     Visit Number 7    Number of Visits 16    Date for PT Re-Evaluation 11/28/22    Authorization Type Medicare + [employer]    PT Start Time 1019    PT Stop Time 1102    PT Time Calculation (min) 43 min    Activity Tolerance Patient tolerated treatment well    Behavior During Therapy WFL for tasks assessed/performed                Past Medical History:  Diagnosis Date   Allergy    Anxiety    takes ativan as needed   Arthritis    Benign positional vertigo    Bursitis of shoulder, left    Cancer (HCC)    Endometrial ca/Recurrence   CKD (chronic kidney disease), stage III (HCC)    Depression    Diabetes (HCC)    History of colonic polyps    Hypercholesteremia    Hypercholesterolemia    Hypertension    Lumbar pain    Osteoarthritis    Osteoporosis    S/P radiation therapy July 29, 2010-Sep 06, 2010   External beam of pelvis   S/P radiation therapy 09/15/10, 09/24/10, 09/28/2010   Intracavitary brachytherapy   SBO (small bowel obstruction) (HCC)    Status post chemotherapy    carboplatin/paclitaxel x 6 rounds   Past Surgical History:  Procedure Laterality Date   ABDOMINAL HYSTERECTOMY     BREAST LUMPECTOMY  1993   Left breast, benign   ROBOTIC ASSISTED LAPAROSCOPIC VAGINAL HYSTERECTOMY WITH FIBROID REMOVAL  July 2010   bilat. salpingo-oophorectomy   Patient Active Problem List   Diagnosis Date Noted   GERD without esophagitis 11/02/2022   Type 2 diabetes mellitus without complications (HCC) 11/02/2022   Abdominal pain 09/25/2022   Dehydration 09/25/2022   Acute prerenal azotemia 09/25/2022   Leukocytosis 09/25/2022   DM2 (diabetes mellitus, type 2) (HCC) 09/25/2022   Dyslipidemia 09/25/2022   GAD (generalized anxiety disorder) 09/25/2022    GERD (gastroesophageal reflux disease) 09/25/2022   IDA (iron deficiency anemia) 11/22/2021   Osteoporosis 08/30/2021   Acute pain of right shoulder 09/30/2019   Myofascial pain 09/18/2019   Cervical radiculopathy 09/12/2019   Essential hypertension 05/18/2013   SBO (small bowel obstruction) (HCC) 05/17/2013   Small bowel obstruction (HCC) 05/16/2013   Malignant neoplasm of corpus uteri, except isthmus (HCC) 05/16/2011    PCP: Gaspar Garbe, MD   REFERRING PROVIDER: Elease Etienne, MD   REFERRING DIAG: [phone] (ICD-10-CM) - SBO (small bowel obstruction) (HCC)   THERAPY DIAG:  Muscle weakness (generalized)  Unsteadiness on feet  Rationale for Evaluation and Treatment: Rehabilitation  ONSET DATE: Hospital Admission 09/24/2022-09/27/2022  SUBJECTIVE:   SUBJECTIVE STATEMENT:  [name] is feeling better after recent obstruction.  She is still on soft diet.  No new falls.  Walking about a 1/4 mile now, endurance is improving, not huffing and puffing.  Feels like her R leg is weaker, had an infection in it a number of years ago.   PERTINENT HISTORY: Patient is an 82 y/o female admitted 09/24/22 with abdominal pain due to SBO. PMH positive for endometrial cancer s/p robotic laparoscopic vaginal hysterectomy and BSO, XRT and chemo, CKD, prior SBO, HLD, DM, HTN.  From Acute Care PT" Patient  presents with decreased mobility due to generalized weakness, decreased balance, history of fall on stairs at home last August, decreased activity tolerance and decreased sensation. She was previously independent, though sedentary, but working from home living with her daughter who works during the day. Currently minguard for hallway ambulation without device noting some veering and imbalance and decreased ankle DF with some "head fullness" per pt from change in weather takes Meclizine at home daily. Given limited sensory systems for balance and new weakness from being NPO and decreased strength  and sensation in her feet feel she will benefit from follow up outpatient PT for balance."  PAIN:  Are you having pain? No has history of neck pain but not concerned with it, able to manage it.   PRECAUTIONS: Fall  WEIGHT BEARING RESTRICTIONS: No  FALLS:  Has patient fallen in last 6 months?  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   PATIENT GOALS: get stronger  NEXT MD VISIT: 10/04/2022  OBJECTIVE:   DIAGNOSTIC FINDINGS: NA  PATIENT SURVEYS:  ABC scale 1130/1600 = 70.6%  COGNITION: Overall cognitive status: Within functional limits for tasks assessed     SENSATION: Slightly decreased sensation bottoms of feet.   POSTURE: rounded shoulders and forward head  LOWER EXTREMITY MMT:  MMT Right eval Left eval  Hip flexion 4 4  Hip extension 4 4  Hip abduction 4 4  Hip adduction 4+ 4+  Hip internal rotation    Hip external rotation    Knee flexion 5 5  Knee extension 4 4  Ankle dorsiflexion 5 5  Ankle plantarflexion 5 5  Ankle inversion    Ankle eversion     (Blank rows = not tested)   FUNCTIONAL TESTS:  5 times sit to stand: 29 seconds 6 minute walk test: 600' Berg Balance Scale: 49/56 Dynamic Gait Index: TBD MCTSIB: Condition 1: Avg of 3 trials: 30 sec, Condition 2: Avg of 3 trials: 30 sec, Condition 3: Avg of 3 trials: 30 sec, Condition 4: Avg of 3 trials: 30 sec, and Total Score: 120/120 increased sway on condition 4.   GAIT: Distance walked: 29' Assistive device utilized: None Level of assistance:  Complete Independence Comments: visually slow gait speed.    TODAY'S TREATMENT:                                                                                                                              DATE:   11/11/22 Therapeutic Exercise: to improve strength and mobility.  Demo, verbal and tactile cues throughout  for technique. Nustep L5 x 8 min  Leg extensions 15# 2 x 10 Hamstring curls 15# 2 x 10   Neuromuscular Reeducation: to improve balance and stability. SBA for safety throughout.  In corner: Tandem stance each side On airex: Ankle sways  Eyes open feet apart x 30 sec  Eyes open feet apart with head nods x 10 Eyes open feet apart with head turns x 10 Eyes closed feet apart x 30 sec Eyes closed feet apart with head nods x 10 Eyes closed feet apart with head turns x 10   11/08/22 Therapeutic Activity: DGI: 19/24  Therapeutic Exercise: to improve strength and mobility.  Demo, verbal and tactile cues throughout for technique.  Nustep L5 x 5 min  Fwd step up and over blue TB on floor x 10 bil Modified lunge x 10 bil side against wall  Gait Training: 180 ft supervision cues to increase step length, heel toe pattern, arm swing  10/25/2022 Therapeutic Exercise: to improve strength and mobility.  Demo, verbal and tactile cues throughout for technique.  Nustep L5 x 6 min  Neuromuscular Reeducation: to improve balance and stability. SBA for safety throughout.  Gait in hallway - with head turns horizontal 3 x 100', nods x 50' - decreased gait speed with both Side stepping x 25' bil Tandem gait x 25 forward and back - 1 hand on wall  Stepping over 1/2 foam roller - forward 2 x 10 bil  Stepping over 1/2 foam roller - side stepping 2 x 10 bil - difficulty with foot placement with R foot, appears to using hip flexors rather than glutes Toe taps on foam yoga block - 2 x 10 bil (on long side) - mirror for posture  10/20/22 Therapeutic Exercise: to improve strength and mobility.   Demo, verbal and tactile cues throughout for technique. Nustep L4x66min 18/24 on DGI Seated lumbar flexion stretch with orange pball 3x15 sec Seated scap retraction 10x3" Standing rows RTB x 10  Standing shoulder ext RTB x 10  Runner stretch x 30 for hip flexors bil Clock balance x 5 R/L postural sway bil when posteriorly  Step ups with no UE support x 5 bil  10/18/2022 Therapeutic Exercise: to improve strength and mobility.  Demo, verbal and tactile cues throughout for technique. Gait x 600' for warm-up  Reviewed OTAGO program booklet to improve balance and safety including: Head movements (rotation x 5) Neck movements (chin tucks x 5) Back Extension x 5  Strengthening: Trunk movements (trunk rotation with hands on hips x 5 each side) Ankle movements (seated ankle pumps x 10 each foot) Front knee strengthening (seated LAQ  x 10 with 2#ankle weights) Back knee strengthening (standing hamstring curls with 2# ankle weights, x 10 bil both hands on counter) Side hip strengthening (hip abduction with 2# ankle weights, x 10 bil, hands on counter) Calf raises x 20 with support 2# ankle weights Toe raises x 20 with support Balance: Knee bends with support x 10 Backwards walking with support 4 x 10 steps Walking and turning around (figure 8 pattern)  Sideways walking 2 x 10 steps each direction  Heel toe standing x 10 sec each side with support Heel toe walking 4 x 10 steps with support One leg stand with support x 10 sec each side Heel walking with support 4 x 10 steps  Toe walking with support 4 x 10 steps Heel toe backwards walking 4 x 10 steps Sit to stands without support x 10  PATIENT EDUCATION:  Education details: continue HEP, can work on balance in corner for safety.  Person educated: Patient Education method: Explanation Education comprehension: verbalized understanding  HOME EXERCISE PROGRAM: OTAGO program  ASSESSMENT:  CLINICAL IMPRESSION: Monya Kozakiewicz is  making good progress.  Today continued to work on both balance and LE strengthening.  She was was challenged today with balance activities especially with head turns and eyes closed, frequently touching wall.  Reports feeling her leg strength improving.  Iyania Brockman continues to demonstrate potential for improvement and would benefit from continued skilled therapy to address impairments.     OBJECTIVE IMPAIRMENTS: decreased activity tolerance, decreased balance, decreased endurance, decreased mobility, and decreased strength.   ACTIVITY LIMITATIONS: locomotion level  PARTICIPATION LIMITATIONS: community activity  PERSONAL FACTORS: Age and 3+ comorbidities: history endometrial cancer s/p hysterectomy, CKD,  SBO, HLD, DM, HTN  are also affecting patient's functional outcome.   REHAB POTENTIAL: Good  CLINICAL DECISION MAKING: Evolving/moderate complexity  EVALUATION COMPLEXITY: Moderate   GOALS: Goals reviewed with patient? Yes  SHORT TERM GOALS: Target date: 10/17/2022   Patient will be independent with initial HEP. Baseline:  Goal status: MET 10/18/22- was on vacation so only tried 1x. 10/25/22- met  LONG TERM GOALS: Target date: 11/28/2022   Patient will be independent with advanced/ongoing HEP to improve outcomes and carryover.  Baseline:  Goal status: IN PROGRESS  2.  Patient will be able to ambulate 900' in 6 minutes to demonstrate ability to ambulate in community.   Baseline: 600' Goal status: IN PROGRESS  3.  Patient will demonstrate improved functional LE strength as demonstrated by 5x STS < 20 seconds. Baseline: 29 seconds Goal status: IN PROGRESS  4.  Patient will demonstrate at least 19/24 on DGI to improve gait stability and reduce risk for falls. Baseline: NT Goal status: MET- 19/24 11/08/22  5.  Patient will report 19 points improvement on ABC scale to demonstrate improved functional ability. Baseline: 1130/1600 Goal status: IN PROGRESS  6.  Patient be able  to maintain tandem stance x 30 sec bil to demonstrate decreased risk of falls.  Baseline: 15 seconds bil  Goal status: IN PROGRESS  PLAN:  PT FREQUENCY: 1-2x/week  PT DURATION: 8 weeks  PLANNED INTERVENTIONS: Therapeutic exercises, Therapeutic activity, Neuromuscular re-education, Balance training, Gait training, Patient/Family education, Self Care, Joint mobilization, Stair training, Prosthetic training, Aquatic Therapy, Cryotherapy, Moist heat, Manual therapy, and Re-evaluation  PLAN FOR NEXT SESSION: continue balance and LE strengthening, has OTAGO program for [employer], PT 11/11/2022, 11:03 AM

## 2022-11-13 NOTE — Therapy (Signed)
OUTPATIENT PHYSICAL THERAPY LOWER EXTREMITY TREATMENT   Patient Name: [name] MRN: [id number] DOB:[birth year], 82 y.o., female Today's Date: 11/14/2022  END OF SESSION:  PT End of Session - 11/14/22 1311     Visit Number 8    Number of Visits 16    Date for PT Re-Evaluation 11/28/22    Authorization Type Medicare + [employer]    PT Start Time 1310    PT Stop Time 1346    PT Time Calculation (min) 36 min    Activity Tolerance Patient tolerated treatment well    Behavior During Therapy WFL for tasks assessed/performed                 Past Medical History:  Diagnosis Date   Allergy    Anxiety    takes ativan as needed   Arthritis    Benign positional vertigo    Bursitis of shoulder, left    Cancer (HCC)    Endometrial ca/Recurrence   CKD (chronic kidney disease), stage III (HCC)    Depression    Diabetes (HCC)    History of colonic polyps    Hypercholesteremia    Hypercholesterolemia    Hypertension    Lumbar pain    Osteoarthritis    Osteoporosis    S/P radiation therapy July 29, 2010-Sep 06, 2010   External beam of pelvis   S/P radiation therapy 09/15/10, 09/24/10, 09/28/2010   Intracavitary brachytherapy   SBO (small bowel obstruction) (HCC)    Status post chemotherapy    carboplatin/paclitaxel x 6 rounds   Past Surgical History:  Procedure Laterality Date   ABDOMINAL HYSTERECTOMY     BREAST LUMPECTOMY  1993   Left breast, benign   ROBOTIC ASSISTED LAPAROSCOPIC VAGINAL HYSTERECTOMY WITH FIBROID REMOVAL  July 2010   bilat. salpingo-oophorectomy   Patient Active Problem List   Diagnosis Date Noted   GERD without esophagitis 11/02/2022   Type 2 diabetes mellitus without complications (HCC) 11/02/2022   Abdominal pain 09/25/2022   Dehydration 09/25/2022   Acute prerenal azotemia 09/25/2022   Leukocytosis 09/25/2022   DM2 (diabetes mellitus, type 2) (HCC) 09/25/2022   Dyslipidemia 09/25/2022   GAD (generalized anxiety disorder) 09/25/2022    GERD (gastroesophageal reflux disease) 09/25/2022   IDA (iron deficiency anemia) 11/22/2021   Osteoporosis 08/30/2021   Acute pain of right shoulder 09/30/2019   Myofascial pain 09/18/2019   Cervical radiculopathy 09/12/2019   Essential hypertension 05/18/2013   SBO (small bowel obstruction) (HCC) 05/17/2013   Small bowel obstruction (HCC) 05/16/2013   Malignant neoplasm of corpus uteri, except isthmus (HCC) 05/16/2011    PCP: Gaspar Garbe, MD   REFERRING PROVIDER: Elease Etienne, MD   REFERRING DIAG: [phone] (ICD-10-CM) - SBO (small bowel obstruction) (HCC)   THERAPY DIAG:  Muscle weakness (generalized)  Unsteadiness on feet  Rationale for Evaluation and Treatment: Rehabilitation  ONSET DATE: Hospital Admission 09/24/2022-09/27/2022  SUBJECTIVE:   SUBJECTIVE STATEMENT:  Patient reports she was active this weekend, but not compliant with HEP.  PERTINENT HISTORY: Patient is an 82 y/o female admitted 09/24/22 with abdominal pain due to SBO. PMH positive for endometrial cancer s/p robotic laparoscopic vaginal hysterectomy and BSO, XRT and chemo, CKD, prior SBO, HLD, DM, HTN.  From Acute Care PT" Patient presents with decreased mobility due to generalized weakness, decreased balance, history of fall on stairs at home last August, decreased activity tolerance and decreased sensation. She was previously independent, though sedentary, but working from home living with her daughter  who works during the day. Currently minguard for hallway ambulation without device noting some veering and imbalance and decreased ankle DF with some "head fullness" per pt from change in weather takes Meclizine at home daily. Given limited sensory systems for balance and new weakness from being NPO and decreased strength and sensation in her feet feel she will benefit from follow up outpatient PT for balance."  PAIN:  Are you having pain? No has history of neck pain but not concerned with it, able to  manage it.   PRECAUTIONS: Fall  WEIGHT BEARING RESTRICTIONS: No  FALLS:  Has patient fallen in last 6 months?  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   PATIENT GOALS: get stronger  NEXT MD VISIT: 10/04/2022  OBJECTIVE:   DIAGNOSTIC FINDINGS: NA  PATIENT SURVEYS:  ABC scale 1130/1600 = 70.6%  COGNITION: Overall cognitive status: Within functional limits for tasks assessed     SENSATION: Slightly decreased sensation bottoms of feet.   POSTURE: rounded shoulders and forward head  LOWER EXTREMITY MMT:  MMT Right eval Left eval  Hip flexion 4 4  Hip extension 4 4  Hip abduction 4 4  Hip adduction 4+ 4+  Hip internal rotation    Hip external rotation    Knee flexion 5 5  Knee extension 4 4  Ankle dorsiflexion 5 5  Ankle plantarflexion 5 5  Ankle inversion    Ankle eversion     (Blank rows = not tested)   FUNCTIONAL TESTS:  5 times sit to stand: 29 seconds 6 minute walk test: 600' Berg Balance Scale: 49/56 Dynamic Gait Index: TBD MCTSIB: Condition 1: Avg of 3 trials: 30 sec, Condition 2: Avg of 3 trials: 30 sec, Condition 3: Avg of 3 trials: 30 sec, Condition 4: Avg of 3 trials: 30 sec, and Total Score: 120/120 increased sway on condition 4.   GAIT: Distance walked: 24' Assistive device utilized: None Level of assistance: Complete Independence Comments: visually slow gait speed.    TODAY'S TREATMENT:                                                                                                                               DATE:   11/14/22 Therapeutic Exercise: to improve strength and mobility.  Demo, verbal and tactile cues throughout for technique. Nustep L5 x 8 min  Leg extensions 15# 1 x 10, 20# 2x10 Hamstring curls 20# 2 x 10   Neuromuscular Reeducation: to improve balance and stability. SBA for safety throughout.  In corner: Tandem  stance each side On airex: Ankle sways  Walking x 10 Marching x 10 difficult Eyes open feet apart x 30 sec  Eyes open feet apart with head nods x 10,  feet together x 10 (challenging) Eyes open feet apart with head turns x 10, feet together x 10 Feet together alternating reaches to opp side x 10 Eyes closed feet apart x 30 sec Eyes closed feet apart with head nods x 10 Eyes closed feet apart with head turns x 10  Standing march on ground holding each march x 3 sec x 10 B  11/11/22 Therapeutic Exercise: to improve strength and mobility.  Demo, verbal and tactile cues throughout for technique. Nustep L5 x 8 min  Leg extensions 15# 2 x 10 Hamstring curls 15# 2 x 10   Neuromuscular Reeducation: to improve balance and stability. SBA for safety throughout.  In corner: Tandem stance each side On airex: Ankle sways  Eyes open feet apart x 30 sec  Eyes open feet apart with head nods x 10 Eyes open feet apart with head turns x 10 Eyes closed feet apart x 30 sec Eyes closed feet apart with head nods x 10 Eyes closed feet apart with head turns x 10   11/08/22 Therapeutic Activity: DGI: 19/24  Therapeutic Exercise: to improve strength and mobility.  Demo, verbal and tactile cues throughout for technique.  Nustep L5 x 5 min  Fwd step up and over blue TB on floor x 10 bil Modified lunge x 10 bil side against wall  Gait Training: 180 ft supervision cues to increase step length, heel toe pattern, arm swing  10/25/2022 Therapeutic Exercise: to improve strength and mobility.  Demo, verbal and tactile cues throughout for technique.  Nustep L5 x 6 min   Neuromuscular Reeducation: to improve balance and stability. SBA for safety throughout.  Gait in hallway - with head turns horizontal 3 x 100', nods x 50' - decreased gait speed with both Side stepping x 25' bil Tandem gait x 25 forward and back - 1 hand on wall  Stepping over 1/2 foam roller - forward 2 x 10 bil  Stepping over 1/2 foam roller - side stepping 2 x 10 bil - difficulty with foot placement with R foot, appears to using hip flexors rather than glutes Toe taps on foam yoga block - 2 x 10 bil (on long side) - mirror for posture  10/20/22 Therapeutic Exercise: to improve strength and mobility.  Demo, verbal and tactile cues throughout for technique. Nustep L4x63min 18/24 on DGI Seated lumbar flexion stretch with orange pball 3x15 sec Seated scap retraction 10x3" Standing rows RTB x 10  Standing shoulder ext RTB x 10  Runner stretch x 30 for hip flexors bil Clock balance x 5 R/L postural sway bil when posteriorly  Step ups with no UE support x 5 bil  PATIENT EDUCATION:  Education details: continue HEP, can work on balance in corner for safety.  Person educated: Patient Education method: Explanation Education comprehension: verbalized understanding  HOME EXERCISE PROGRAM: OTAGO program  ASSESSMENT:  CLINICAL IMPRESSION: Kayren Holck is making good progress.  Today continued to work on both balance and LE strengthening.  She was was challenged today with balance activities especially with head turns and eyes closed and with feet close together, frequently touching wall.  Ashaunti Aina continues to demonstrate potential for improvement and would benefit from continued skilled therapy to address impairments.     OBJECTIVE IMPAIRMENTS: decreased activity tolerance, decreased balance, decreased endurance,  decreased mobility, and decreased strength.   ACTIVITY LIMITATIONS: locomotion level  PARTICIPATION LIMITATIONS: community activity  PERSONAL FACTORS: Age and 3+  comorbidities: history endometrial cancer s/p hysterectomy, CKD,  SBO, HLD, DM, HTN  are also affecting patient's functional outcome.   REHAB POTENTIAL: Good  CLINICAL DECISION MAKING: Evolving/moderate complexity  EVALUATION COMPLEXITY: Moderate   GOALS: Goals reviewed with patient? Yes  SHORT TERM GOALS: Target date: 10/17/2022   Patient will be independent with initial HEP. Baseline:  Goal status: MET 10/18/22- was on vacation so only tried 1x. 10/25/22- met  LONG TERM GOALS: Target date: 11/28/2022   Patient will be independent with advanced/ongoing HEP to improve outcomes and carryover.  Baseline:  Goal status: IN PROGRESS  2.  Patient will be able to ambulate 900' in 6 minutes to demonstrate ability to ambulate in community.   Baseline: 600' Goal status: IN PROGRESS  3.  Patient will demonstrate improved functional LE strength as demonstrated by 5x STS < 20 seconds. Baseline: 29 seconds Goal status: IN PROGRESS  4.  Patient will demonstrate at least 19/24 on DGI to improve gait stability and reduce risk for falls. Baseline: NT Goal status: MET- 19/24 11/08/22  5.  Patient will report 19 points improvement on ABC scale to demonstrate improved functional ability. Baseline: 1130/1600 Goal status: IN PROGRESS  6.  Patient be able to maintain tandem stance x 30 sec bil to demonstrate decreased risk of falls.  Baseline: 15 seconds bil  Goal status: IN PROGRESS  PLAN:  PT FREQUENCY: 1-2x/week  PT DURATION: 8 weeks  PLANNED INTERVENTIONS: Therapeutic exercises, Therapeutic activity, Neuromuscular re-education, Balance training, Gait training, Patient/Family education, Self Care, Joint mobilization, Stair training, Prosthetic training, Aquatic Therapy, Cryotherapy, Moist heat, Manual therapy, and Re-evaluation  PLAN FOR NEXT SESSION: continue balance and LE strengthening, has OTAGO program for [employer], PT 11/14/2022, 1:49 PM

## 2022-11-14 ENCOUNTER — Encounter: Payer: Self-pay | Admitting: Physical Therapy

## 2022-11-14 ENCOUNTER — Ambulatory Visit: Payer: Medicare Other | Admitting: Physical Therapy

## 2022-11-14 DIAGNOSIS — M6281 Muscle weakness (generalized): Secondary | ICD-10-CM

## 2022-11-14 DIAGNOSIS — R2681 Unsteadiness on feet: Secondary | ICD-10-CM

## 2022-11-17 ENCOUNTER — Encounter: Payer: Self-pay | Admitting: Physical Therapy

## 2022-11-17 ENCOUNTER — Ambulatory Visit: Payer: Medicare Other | Admitting: Physical Therapy

## 2022-11-17 DIAGNOSIS — M6281 Muscle weakness (generalized): Secondary | ICD-10-CM | POA: Diagnosis not present

## 2022-11-17 DIAGNOSIS — R2681 Unsteadiness on feet: Secondary | ICD-10-CM | POA: Diagnosis not present

## 2022-11-17 NOTE — Therapy (Signed)
OUTPATIENT PHYSICAL THERAPY LOWER EXTREMITY TREATMENT   Patient Name: [name] MRN: [id number] DOB:23-Apr-1941, 82 y.o., female Today's Date: 11/17/2022  END OF SESSION:  PT End of Session - 11/17/22 1114     Visit Number 9    Number of Visits 16    Date for PT Re-Evaluation 11/28/22    Authorization Type Medicare + [employer]    PT Start Time 1111   arrived late   Activity Tolerance Patient tolerated treatment well    Behavior During Therapy [facility] for tasks assessed/performed                 Past Medical History:  Diagnosis Date   Allergy    Anxiety    takes ativan as needed   Arthritis    Benign positional vertigo    Bursitis of shoulder, left    Cancer (HCC)    Endometrial ca/Recurrence   CKD (chronic kidney disease), stage III (HCC)    Depression    Diabetes (HCC)    History of colonic polyps    Hypercholesteremia    Hypercholesterolemia    Hypertension    Lumbar pain    Osteoarthritis    Osteoporosis    S/P radiation therapy July 29, 2010-Sep 06, 2010   External beam of pelvis   S/P radiation therapy 09/15/10, 09/24/10, 09/28/2010   Intracavitary brachytherapy   SBO (small bowel obstruction) (HCC)    Status post chemotherapy    carboplatin/paclitaxel x 6 rounds   Past Surgical History:  Procedure Laterality Date   ABDOMINAL HYSTERECTOMY     BREAST LUMPECTOMY  1993   Left breast, benign   ROBOTIC ASSISTED LAPAROSCOPIC VAGINAL HYSTERECTOMY WITH FIBROID REMOVAL  July 2010   bilat. salpingo-oophorectomy   Patient Active Problem List   Diagnosis Date Noted   GERD without esophagitis 11/02/2022   Type 2 diabetes mellitus without complications (HCC) 11/02/2022   Abdominal pain 09/25/2022   Dehydration 09/25/2022   Acute prerenal azotemia 09/25/2022   Leukocytosis 09/25/2022   DM2 (diabetes mellitus, type 2) (HCC) 09/25/2022   Dyslipidemia 09/25/2022   GAD (generalized anxiety disorder) 09/25/2022   GERD (gastroesophageal reflux disease)  09/25/2022   IDA (iron deficiency anemia) 11/22/2021   Osteoporosis 08/30/2021   Acute pain of right shoulder 09/30/2019   Myofascial pain 09/18/2019   Cervical radiculopathy 09/12/2019   Essential hypertension 05/18/2013   SBO (small bowel obstruction) (HCC) 05/17/2013   Small bowel obstruction (HCC) 05/16/2013   Malignant neoplasm of corpus uteri, except isthmus (HCC) 05/16/2011    PCP: Gaspar Garbe, MD   REFERRING PROVIDER: Elease Etienne, MD   REFERRING DIAG: [phone] (ICD-10-CM) - SBO (small bowel obstruction) (HCC)   THERAPY DIAG:  Muscle weakness (generalized)  Unsteadiness on feet  Rationale for Evaluation and Treatment: Rehabilitation  ONSET DATE: Hospital Admission 09/24/2022-09/27/2022  SUBJECTIVE:   SUBJECTIVE STATEMENT:  Hampton Cost reports she walked yesterday before the rain came and did a few of the exercises.  Being careful to stay hydrated and watching diet carefully.   PERTINENT HISTORY: Patient is an 82 y/o female admitted 09/24/22 with abdominal pain due to SBO. PMH positive for endometrial cancer s/p robotic laparoscopic vaginal hysterectomy and BSO, XRT and chemo, CKD, prior SBO, HLD, DM, HTN.  From Acute Care PT" Patient presents with decreased mobility due to generalized weakness, decreased balance, history of fall on stairs at home last August, decreased activity tolerance and decreased sensation. She was previously independent, though sedentary, but working from home living  with her daughter who works during the day. Currently minguard for hallway ambulation without device noting some veering and imbalance and decreased ankle DF with some "head fullness" per pt from change in weather takes Meclizine at home daily. Given limited sensory systems for balance and new weakness from being NPO and decreased strength and sensation in her feet feel she will benefit from follow up outpatient PT for balance."  PAIN:  Are you having pain? No has history of  neck pain but not concerned with it, able to manage it.   PRECAUTIONS: Fall  WEIGHT BEARING RESTRICTIONS: No  FALLS:  Has patient fallen in last 6 months?  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   PATIENT GOALS: get stronger  NEXT MD VISIT: 10/04/2022  OBJECTIVE:   DIAGNOSTIC FINDINGS: NA  PATIENT SURVEYS:  ABC scale 1130/1600 = 70.6%  COGNITION: Overall cognitive status: Within functional limits for tasks assessed     SENSATION: Slightly decreased sensation bottoms of feet.   POSTURE: rounded shoulders and forward head  LOWER EXTREMITY MMT:  MMT Right eval Left eval  Hip flexion 4 4  Hip extension 4 4  Hip abduction 4 4  Hip adduction 4+ 4+  Hip internal rotation    Hip external rotation    Knee flexion 5 5  Knee extension 4 4  Ankle dorsiflexion 5 5  Ankle plantarflexion 5 5  Ankle inversion    Ankle eversion     (Blank rows = not tested)   FUNCTIONAL TESTS:  5 times sit to stand: 29 seconds 6 minute walk test: 600' Berg Balance Scale: 49/56 Dynamic Gait Index: TBD MCTSIB: Condition 1: Avg of 3 trials: 30 sec, Condition 2: Avg of 3 trials: 30 sec, Condition 3: Avg of 3 trials: 30 sec, Condition 4: Avg of 3 trials: 30 sec, and Total Score: 120/120 increased sway on condition 4.   GAIT: Distance walked: 45' Assistive device utilized: None Level of assistance: Complete Independence Comments: visually slow gait speed.    TODAY'S TREATMENT:                                                                                                                               DATE:   11/17/22 Therapeutic Exercise: to improve strength and mobility.  Demo, verbal and tactile cues throughout for technique. Nustep L5 x5 min  Review of HEP and difficulties performing Update of HEP- Postural exercises -chin tucks, retro shoulder rolls, scap squeezes Counter exercises - hip abduction, hip extension, hip flexion, heel raises,  toe raises 5x STS Tandem stance Gait x 400' - became very SOB    11/14/22 Therapeutic Exercise: to improve strength and mobility.  Demo, verbal and tactile cues throughout for technique. Nustep L5 x 8 min  Leg extensions 15# 1 x 10, 20# 2x10 Hamstring curls 20# 2 x 10   Neuromuscular Reeducation: to improve balance and stability. SBA for safety throughout.  In corner: Tandem stance each side On airex: Ankle sways  Walking x 10 Marching x 10 difficult Eyes open feet apart x 30 sec  Eyes open feet apart with head nods x 10,  feet together x 10 (challenging) Eyes open feet apart with head turns x 10, feet together x 10 Feet together alternating reaches to opp side x 10 Eyes closed feet apart x 30 sec Eyes closed feet apart with head nods x 10 Eyes closed feet apart with head turns x 10  Standing march on ground holding each march x 3 sec x 10 B  11/11/22 Therapeutic Exercise: to improve strength and mobility.  Demo, verbal and tactile cues throughout for technique. Nustep L5 x 8 min  Leg extensions 15# 2 x 10 Hamstring curls 15# 2 x 10   Neuromuscular Reeducation: to improve balance and stability. SBA for safety throughout.  In corner: Tandem stance each side On airex: Ankle sways  Eyes open feet apart x 30 sec  Eyes open feet apart with head nods x 10 Eyes open feet apart with head turns x 10 Eyes closed feet apart x 30 sec Eyes closed feet apart with head nods x 10 Eyes closed feet apart with head turns x 10    PATIENT EDUCATION:  Education details: HEP  update Person educated: Patient Education method: Explanation, Demonstration, Verbal cues, and Handouts Education comprehension: verbalized understanding and returned demonstration  HOME EXERCISE PROGRAM: OTAGO program  Access Code: [email or web address] URL: [email or web address] Date: 11/17/2022 Prepared by: Harrie Foreman  Exercises - Seated Cervical Retraction  - 3 x daily - 7 x weekly - 1 sets - 5 reps - 5 sec  hold - Seated Shoulder Rolls  - 3 x daily - 7 x weekly - 1 sets - 10 reps - Seated Scapular Retraction  - 3 x daily - 7 x weekly - 1 sets - 5 reps - 5 sec  hold - Sit to Stand  - 3 x daily - 7 x weekly - 1 sets - 10 reps - Heel Toe Raises with Counter Support  - 1 x daily - 7 x weekly - 3 sets - 10 reps - Standing Hip Abduction with Counter Support  - 1 x daily - 7 x weekly - 3 sets - 10 reps - Standing Hip Extension with Counter Support  - 1 x daily - 7 x weekly - 3 sets - 10 reps - Standing Hip Flexion with Counter Support  - 1 x daily - 7 x weekly - 3 sets - 10 reps  ASSESSMENT:  CLINICAL IMPRESSION: Evanna Washinton reports difficulty with HEP due to working full time still, so discussed taking frequent movement breaks instead of feeling like she needs to do her entire HEP.  Gave her exercises for her posture to work on as well as sit to stands at desk for at least hourly movement breaks at desk, and then standing counter exercises for larger movement breaks in kitchen, in addition to discussing senior center and mall walking program.  She has met LTG #3 now, demonstrating improved functional LE strength, but her endurance  is still poor, becoming very short of breath when walking faster pace.  Tinamarie Kissick continues to demonstrate potential for improvement and would benefit from continued skilled therapy to address impairments.     OBJECTIVE IMPAIRMENTS: decreased activity tolerance, decreased balance, decreased endurance, decreased mobility, and decreased strength.    ACTIVITY LIMITATIONS: locomotion level  PARTICIPATION LIMITATIONS: community activity  PERSONAL FACTORS: Age and 3+ comorbidities: history endometrial cancer s/p hysterectomy, CKD,  SBO, HLD, DM, HTN  are also affecting patient's functional outcome.   REHAB POTENTIAL: Good  CLINICAL DECISION MAKING: Evolving/moderate complexity  EVALUATION COMPLEXITY: Moderate   GOALS: Goals reviewed with patient? Yes  SHORT TERM GOALS: Target date: 10/17/2022   Patient will be independent with initial HEP. Baseline:  Goal status: MET 10/18/22- was on vacation so only tried 1x. 10/25/22- met  LONG TERM GOALS: Target date: 11/28/2022   Patient will be independent with advanced/ongoing HEP to improve outcomes and carryover.  Baseline:  Goal status: IN PROGRESS 11/17/22- independent but poor compliance.   2.  Patient will be able to ambulate 900' in 6 minutes to demonstrate ability to ambulate in community.   Baseline: 600' Goal status: IN PROGRESS 11/17/22- 400' today, dyspnea on exertion   3.  Patient will demonstrate improved functional LE strength as demonstrated by 5x STS < 20 seconds. Baseline: 29 seconds Goal status: MET 11/17/22- 19 seconds  4.  Patient will demonstrate at least 19/24 on DGI to improve gait stability and reduce risk for falls. Baseline: NT Goal status: MET- 19/24 11/08/22  5.  Patient will report 19 points improvement on ABC scale to demonstrate improved functional ability. Baseline: 1130/1600 Goal status: IN PROGRESS  6.  Patient be able to maintain tandem stance x 30 sec bil to demonstrate decreased risk of falls.  Baseline: 15 seconds bil  Goal status: IN PROGRESS 11/17/22- 27  seconds L foot forward, variable R foot forward up to 15 seconds.   PLAN:  PT FREQUENCY: 1-2x/week  PT DURATION: 8 weeks  PLANNED INTERVENTIONS: Therapeutic exercises, Therapeutic activity, Neuromuscular re-education, Balance training, Gait training, Patient/Family education, Self Care,  Joint mobilization, Stair training, Prosthetic training, Aquatic Therapy, Cryotherapy, Moist heat, Manual therapy, and Re-evaluation  PLAN FOR NEXT SESSION: review and progress HEP  Jena Gauss, PT  11/17/2022, 12:05 PM

## 2022-11-20 ENCOUNTER — Other Ambulatory Visit: Payer: Self-pay | Admitting: Internal Medicine

## 2022-11-22 ENCOUNTER — Ambulatory Visit: Payer: Medicare Other | Admitting: Physical Therapy

## 2022-11-22 DIAGNOSIS — R2681 Unsteadiness on feet: Secondary | ICD-10-CM

## 2022-11-22 DIAGNOSIS — M6281 Muscle weakness (generalized): Secondary | ICD-10-CM

## 2022-11-22 NOTE — Therapy (Addendum)
OUTPATIENT PHYSICAL THERAPY LOWER EXTREMITY TREATMENT/Discharge Progress Note Reporting Period 10/03/2022 to 11/22/2022  See note below for Objective Data and Assessment of Progress/Goals.      Patient Name: [name] MRN: [id number] DOB:[birth year], 82 y.o., female Today's Date: 11/22/2022  END OF SESSION:  PT End of Session - 11/22/22 1028     Visit Number 10    Number of Visits 16    Date for PT Re-Evaluation 11/28/22    Authorization Type Medicare + [employer]    PT Start Time 1026    PT Stop Time 1100    PT Time Calculation (min) 34 min    Activity Tolerance Patient tolerated treatment well    Behavior During Therapy WFL for tasks assessed/performed                 Past Medical History:  Diagnosis Date   Allergy    Anxiety    takes ativan as needed   Arthritis    Benign positional vertigo    Bursitis of shoulder, left    Cancer (HCC)    Endometrial ca/Recurrence   CKD (chronic kidney disease), stage III (HCC)    Depression    Diabetes (HCC)    History of colonic polyps    Hypercholesteremia    Hypercholesterolemia    Hypertension    Lumbar pain    Osteoarthritis    Osteoporosis    S/P radiation therapy July 29, 2010-Sep 06, 2010   External beam of pelvis   S/P radiation therapy 09/15/10, 09/24/10, 09/28/2010   Intracavitary brachytherapy   SBO (small bowel obstruction) (HCC)    Status post chemotherapy    carboplatin/paclitaxel x 6 rounds   Past Surgical History:  Procedure Laterality Date   ABDOMINAL HYSTERECTOMY     BREAST LUMPECTOMY  1993   Left breast, benign   ROBOTIC ASSISTED LAPAROSCOPIC VAGINAL HYSTERECTOMY WITH FIBROID REMOVAL  July 2010   bilat. salpingo-oophorectomy   Patient Active Problem List   Diagnosis Date Noted   GERD without esophagitis 11/02/2022   Type 2 diabetes mellitus without complications (HCC) 11/02/2022   Abdominal pain 09/25/2022   Dehydration 09/25/2022   Acute prerenal azotemia 09/25/2022    Leukocytosis 09/25/2022   DM2 (diabetes mellitus, type 2) (HCC) 09/25/2022   Dyslipidemia 09/25/2022   GAD (generalized anxiety disorder) 09/25/2022   GERD (gastroesophageal reflux disease) 09/25/2022   IDA (iron deficiency anemia) 11/22/2021   Osteoporosis 08/30/2021   Acute pain of right shoulder 09/30/2019   Myofascial pain 09/18/2019   Cervical radiculopathy 09/12/2019   Essential hypertension 05/18/2013   SBO (small bowel obstruction) (HCC) 05/17/2013   Small bowel obstruction (HCC) 05/16/2013   Malignant neoplasm of corpus uteri, except isthmus (HCC) 05/16/2011    PCP: Gaspar Garbe, MD   REFERRING PROVIDER: Elease Etienne, MD   REFERRING DIAG: [phone] (ICD-10-CM) - SBO (small bowel obstruction) (HCC)   THERAPY DIAG:  Muscle weakness (generalized)  Unsteadiness on feet  Rationale for Evaluation and Treatment: Rehabilitation  ONSET DATE: Hospital Admission 09/24/2022-09/27/2022 Readmitted 11/01/2022  SUBJECTIVE:   SUBJECTIVE STATEMENT:  [name] reports she did the new exercises yesterday.  She didn't sleep well last night and is very tired today.   PERTINENT HISTORY: Patient is an 82 y/o female admitted 09/24/22 with abdominal pain due to SBO. PMH positive for endometrial cancer s/p robotic laparoscopic vaginal hysterectomy and BSO, XRT and chemo, CKD, prior SBO, HLD, DM, HTN.  From Acute Care PT" Patient presents with decreased mobility due to  generalized weakness, decreased balance, history of fall on stairs at home last August, decreased activity tolerance and decreased sensation. She was previously independent, though sedentary, but working from home living with her daughter who works during the day. Currently minguard for hallway ambulation without device noting some veering and imbalance and decreased ankle DF with some "head fullness" per pt from change in weather takes Meclizine at home daily. Given limited sensory systems for balance and new weakness from  being NPO and decreased strength and sensation in her feet feel she will benefit from follow up outpatient PT for balance."  PAIN:  Are you having pain? No has history of neck pain but not concerned with it, able to manage it.   PRECAUTIONS: Fall  WEIGHT BEARING RESTRICTIONS: No  FALLS:  Has patient fallen in last 6 months?  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   PATIENT GOALS: get stronger  NEXT MD VISIT: 10/04/2022  OBJECTIVE:   DIAGNOSTIC FINDINGS: NA  PATIENT SURVEYS:  ABC scale 1130/1600 = 70.6%  COGNITION: Overall cognitive status: Within functional limits for tasks assessed     SENSATION: Slightly decreased sensation bottoms of feet.   POSTURE: rounded shoulders and forward head  LOWER EXTREMITY MMT:  MMT Right eval Left eval  Hip flexion 4 4  Hip extension 4 4  Hip abduction 4 4  Hip adduction 4+ 4+  Hip internal rotation    Hip external rotation    Knee flexion 5 5  Knee extension 4 4  Ankle dorsiflexion 5 5  Ankle plantarflexion 5 5  Ankle inversion    Ankle eversion     (Blank rows = not tested)   FUNCTIONAL TESTS:  5 times sit to stand: 29 seconds 6 minute walk test: 600' Berg Balance Scale: 49/56 Dynamic Gait Index: TBD MCTSIB: Condition 1: Avg of 3 trials: 30 sec, Condition 2: Avg of 3 trials: 30 sec, Condition 3: Avg of 3 trials: 30 sec, Condition 4: Avg of 3 trials: 30 sec, and Total Score: 120/120 increased sway on condition 4.   GAIT: Distance walked: 50' Assistive device utilized:  None Level of assistance: Complete Independence Comments: visually slow gait speed.    TODAY'S TREATMENT:                                                                                                                              DATE:   11/22/22 Therapeutic Exercise: to improve strength and mobility.  Demo, verbal and tactile cues throughout for technique. Nustep L5 x 6  min  Gait x 600' - 2 seated rest breaks needed Review of HEP and [place] exercises - discussion of how to incorporate more exercises into daily life- ie. tandem walking in hallway   11/17/22 Therapeutic Exercise: to improve strength and mobility.  Demo, verbal and tactile cues throughout for technique. Nustep L5 x5 min  Review of HEP and difficulties performing Update of HEP- Postural exercises -chin tucks, retro shoulder rolls, scap squeezes Counter exercises - hip abduction, hip extension, hip flexion, heel raises, toe raises 5x STS Tandem stance Gait x 400' - became very SOB    11/14/22 Therapeutic Exercise: to improve strength and mobility.  Demo, verbal and tactile cues throughout for technique. Nustep L5 x 8 min  Leg extensions 15# 1 x 10, 20# 2x10 Hamstring curls 20# 2 x 10   Neuromuscular Reeducation: to improve balance and stability. SBA for safety throughout.  In corner: Tandem stance each side On airex: Ankle sways  Walking x 10 Marching x 10 difficult Eyes open feet apart x 30 sec  Eyes open feet apart with head nods x 10,  feet together x 10 (challenging) Eyes open feet apart with head turns x 10, feet together x 10 Feet together alternating reaches to opp side x 10 Eyes closed feet apart x 30 sec Eyes closed feet apart with head nods x 10 Eyes closed feet apart with head turns x 10  Standing march on ground holding each march x 3 sec x 10 B  11/11/22 Therapeutic Exercise: to improve strength and mobility.  Demo, verbal and tactile cues throughout for technique. Nustep L5 x 8 min  Leg  extensions 15# 2 x 10 Hamstring curls 15# 2 x 10   Neuromuscular Reeducation: to improve balance and stability. SBA for safety throughout.  In corner: Tandem stance each side On airex: Ankle sways  Eyes open feet apart x 30 sec  Eyes open feet apart with head nods x 10 Eyes open feet apart with head turns x 10 Eyes closed feet apart x 30 sec Eyes closed feet apart with head nods x 10 Eyes closed feet apart with head turns x 10    PATIENT EDUCATION:  Education details: HEP update Person educated: Patient Education method: Explanation, Demonstration, Verbal cues, and Handouts Education comprehension: verbalized understanding and returned demonstration  HOME EXERCISE PROGRAM: OTAGO program  Access Code: [email or web address] URL: [email or web address] Date: 11/17/2022 Prepared by: Harrie Foreman  Exercises - Seated Cervical Retraction  - 3 x daily - 7 x weekly - 1 sets - 5 reps - 5 sec  hold - Seated Shoulder Rolls  - 3 x daily - 7 x weekly - 1 sets - 10 reps - Seated Scapular Retraction  - 3 x daily - 7 x weekly - 1 sets - 5 reps - 5 sec  hold - Sit to Stand  - 3 x daily - 7 x weekly - 1 sets - 10 reps - Heel Toe Raises with Counter Support  - 1 x daily - 7 x weekly - 3 sets - 10 reps - Standing Hip Abduction with Counter Support  - 1 x daily - 7 x weekly - 3 sets - 10 reps - Standing Hip Extension with Counter Support  - 1 x daily - 7 x weekly - 3 sets - 10 reps - Standing Hip Flexion with Counter Support  - 1 x daily - 7 x weekly - 3 sets - 10 reps  ASSESSMENT:  CLINICAL IMPRESSION: [name] reports improved compliance  with HEP now that she is breaking it up into her workday, she is doing postural exercises when seated during calls, and standing exercises in kitchen now.  She fatigued very quickly again today with gait, even at self selected speed, but reported feeling very tired overall.  She reports she has started walking every evening now at least 10 minutes.    Today continued discussing how she could incorporate more exercises into daily life to make them less of an exercise and more of an opportunity to maintain and progress strength and balance - like tandem walking in hallway, or standing on one leg while brushing teeth in morning.  Overall she reports improved strength and steadiness, and feels confident with wrapping up PT next session.  [name] continues to demonstrate potential for improvement and would benefit from continued skilled therapy to address impairments.     OBJECTIVE IMPAIRMENTS: decreased activity tolerance, decreased balance, decreased endurance, decreased mobility, and decreased strength.   ACTIVITY LIMITATIONS: locomotion level  PARTICIPATION LIMITATIONS: community activity  PERSONAL FACTORS: Age and 3+ comorbidities: history endometrial cancer s/p hysterectomy, CKD,  SBO, HLD, DM, HTN  are also affecting patient's functional outcome.   REHAB POTENTIAL: Good  CLINICAL DECISION MAKING: Evolving/moderate complexity  EVALUATION COMPLEXITY: Moderate   GOALS: Goals reviewed with patient? Yes  SHORT TERM GOALS: Target date: 10/17/2022   Patient will be independent with initial HEP. Baseline:  Goal status: MET 10/18/22- was on vacation so only tried 1x. 10/25/22- met  LONG TERM GOALS: Target date: 11/28/2022   Patient will be independent with advanced/ongoing HEP to improve outcomes and carryover.  Baseline:  Goal status: IN PROGRESS 11/17/22- independent but poor compliance.   2.  Patient will be able to ambulate 900' in 6 minutes to demonstrate ability to ambulate in community.   Baseline: 600' Goal status: IN PROGRESS 11/17/22- 400' today, dyspnea on exertion 11/22/22- 600', but reports walking 10 minutes daily in evening.   3.  Patient will demonstrate improved functional LE strength as demonstrated by 5x STS < 20 seconds. Baseline: 29 seconds Goal status: MET 11/17/22- 19 seconds  4.  Patient will demonstrate  at least 19/24 on DGI to improve gait stability and reduce risk for falls. Baseline: NT Goal status: MET- 19/24 11/08/22  5.  Patient will report 19 points improvement on ABC scale to demonstrate improved functional ability. Baseline: 1130/1600 Goal status: IN PROGRESS  6.  Patient be able to maintain tandem stance x 30 sec bil to demonstrate decreased risk of falls.  Baseline: 15 seconds bil  Goal status: IN PROGRESS 11/17/22- 27  seconds L foot forward, variable R foot forward up to 15 seconds.   PLAN:  PT FREQUENCY: 1-2x/week  PT DURATION: 8 weeks  PLANNED INTERVENTIONS: Therapeutic exercises, Therapeutic activity, Neuromuscular re-education, Balance training, Gait training, Patient/Family education, Self Care, Joint mobilization, Stair training, Prosthetic training, Aquatic Therapy, Cryotherapy, Moist heat, Manual therapy, and Re-evaluation  PLAN FOR NEXT SESSION: 30 day hold or D/C  Jena Gauss, PT , DPT 11/22/2022, 12:06 PM   PHYSICAL THERAPY DISCHARGE SUMMARY  Visits from Start of Care: 10  Current functional level related to goals / functional outcomes: See above, improved functional strength   Remaining deficits: See above, rapid fatige    Education / Equipment: HEP  Plan:  Refer to above clinical impression and goal assessment for status as of last visit on 11/22/2022. Patient did not return after this visit and has not needed to return to PT, therefore will  proceed with discharge from PT for this episode.     Jena Gauss, PT, DPT  02/15/2023 3:28 PM

## 2022-11-24 ENCOUNTER — Ambulatory Visit: Payer: Medicare Other | Admitting: Physical Therapy

## 2022-11-28 ENCOUNTER — Encounter: Payer: Medicare Other | Admitting: Physical Therapy

## 2023-01-06 DIAGNOSIS — H9193 Unspecified hearing loss, bilateral: Secondary | ICD-10-CM | POA: Diagnosis not present

## 2023-01-06 DIAGNOSIS — F418 Other specified anxiety disorders: Secondary | ICD-10-CM | POA: Diagnosis not present

## 2023-01-06 DIAGNOSIS — J302 Other seasonal allergic rhinitis: Secondary | ICD-10-CM | POA: Diagnosis not present

## 2023-01-06 DIAGNOSIS — D509 Iron deficiency anemia, unspecified: Secondary | ICD-10-CM | POA: Diagnosis not present

## 2023-01-06 DIAGNOSIS — E46 Unspecified protein-calorie malnutrition: Secondary | ICD-10-CM | POA: Diagnosis not present

## 2023-01-06 DIAGNOSIS — N1831 Chronic kidney disease, stage 3a: Secondary | ICD-10-CM | POA: Diagnosis not present

## 2023-01-06 DIAGNOSIS — E1129 Type 2 diabetes mellitus with other diabetic kidney complication: Secondary | ICD-10-CM | POA: Diagnosis not present

## 2023-01-06 DIAGNOSIS — I129 Hypertensive chronic kidney disease with stage 1 through stage 4 chronic kidney disease, or unspecified chronic kidney disease: Secondary | ICD-10-CM | POA: Diagnosis not present

## 2023-01-06 DIAGNOSIS — S329XXS Fracture of unspecified parts of lumbosacral spine and pelvis, sequela: Secondary | ICD-10-CM | POA: Diagnosis not present

## 2023-01-06 DIAGNOSIS — Z23 Encounter for immunization: Secondary | ICD-10-CM | POA: Diagnosis not present

## 2023-01-06 DIAGNOSIS — M810 Age-related osteoporosis without current pathological fracture: Secondary | ICD-10-CM | POA: Diagnosis not present

## 2023-01-06 DIAGNOSIS — C541 Malignant neoplasm of endometrium: Secondary | ICD-10-CM | POA: Diagnosis not present

## 2023-01-09 DIAGNOSIS — E119 Type 2 diabetes mellitus without complications: Secondary | ICD-10-CM | POA: Diagnosis not present

## 2023-01-18 DIAGNOSIS — H8110 Benign paroxysmal vertigo, unspecified ear: Secondary | ICD-10-CM | POA: Diagnosis not present

## 2023-01-18 DIAGNOSIS — H90A31 Mixed conductive and sensorineural hearing loss, unilateral, right ear with restricted hearing on the contralateral side: Secondary | ICD-10-CM | POA: Diagnosis not present

## 2023-01-18 DIAGNOSIS — H838X3 Other specified diseases of inner ear, bilateral: Secondary | ICD-10-CM | POA: Diagnosis not present

## 2023-01-18 DIAGNOSIS — H90A22 Sensorineural hearing loss, unilateral, left ear, with restricted hearing on the contralateral side: Secondary | ICD-10-CM | POA: Diagnosis not present

## 2023-01-21 ENCOUNTER — Emergency Department (HOSPITAL_BASED_OUTPATIENT_CLINIC_OR_DEPARTMENT_OTHER): Payer: Medicare Other

## 2023-01-21 ENCOUNTER — Emergency Department (HOSPITAL_BASED_OUTPATIENT_CLINIC_OR_DEPARTMENT_OTHER)
Admission: EM | Admit: 2023-01-21 | Discharge: 2023-01-21 | Disposition: A | Discharge status: Home / Self Care | Payer: Medicare Other | Attending: Emergency Medicine | Admitting: Emergency Medicine

## 2023-01-21 ENCOUNTER — Other Ambulatory Visit: Payer: Self-pay

## 2023-01-21 ENCOUNTER — Encounter (HOSPITAL_BASED_OUTPATIENT_CLINIC_OR_DEPARTMENT_OTHER): Payer: Self-pay

## 2023-01-21 DIAGNOSIS — Z8540 Personal history of malignant neoplasm of unspecified female genital organ: Secondary | ICD-10-CM | POA: Insufficient documentation

## 2023-01-21 DIAGNOSIS — M25551 Pain in right hip: Secondary | ICD-10-CM | POA: Insufficient documentation

## 2023-01-21 DIAGNOSIS — M545 Low back pain, unspecified: Secondary | ICD-10-CM | POA: Insufficient documentation

## 2023-01-21 DIAGNOSIS — N183 Chronic kidney disease, stage 3 unspecified: Secondary | ICD-10-CM | POA: Insufficient documentation

## 2023-01-21 DIAGNOSIS — R188 Other ascites: Secondary | ICD-10-CM | POA: Diagnosis not present

## 2023-01-21 DIAGNOSIS — C541 Malignant neoplasm of endometrium: Secondary | ICD-10-CM | POA: Diagnosis not present

## 2023-01-21 DIAGNOSIS — I129 Hypertensive chronic kidney disease with stage 1 through stage 4 chronic kidney disease, or unspecified chronic kidney disease: Secondary | ICD-10-CM | POA: Insufficient documentation

## 2023-01-21 DIAGNOSIS — R109 Unspecified abdominal pain: Secondary | ICD-10-CM | POA: Insufficient documentation

## 2023-01-21 DIAGNOSIS — E1122 Type 2 diabetes mellitus with diabetic chronic kidney disease: Secondary | ICD-10-CM | POA: Diagnosis not present

## 2023-01-21 DIAGNOSIS — K7210 Chronic hepatic failure without coma: Secondary | ICD-10-CM | POA: Diagnosis not present

## 2023-01-21 LAB — URINALYSIS, ROUTINE W REFLEX MICROSCOPIC
Bilirubin Urine: NEGATIVE
Glucose, UA: NEGATIVE mg/dL
Hgb urine dipstick: NEGATIVE
Ketones, ur: NEGATIVE mg/dL
Leukocytes,Ua: NEGATIVE
Nitrite: NEGATIVE
Protein, ur: NEGATIVE mg/dL
Specific Gravity, Urine: <=1.005 (ref 1.005–1.030)
pH: 5.5 (ref 5.0–8.0)

## 2023-01-21 MED ORDER — METHOCARBAMOL 500 MG PO TABS: 500.0000 mg | ORAL_TABLET | Freq: Three times a day (TID) | ORAL | 0 refills | Status: AC | PRN

## 2023-01-21 MED ORDER — LIDOCAINE 5 % EX PTCH: 1.0000 | MEDICATED_PATCH | CUTANEOUS | 0 refills | Status: AC

## 2023-01-21 NOTE — ED Notes (Signed)
Pt. Reports back pain for several days.

## 2023-01-21 NOTE — ED Provider Notes (Signed)
Emergency Department Provider Note   I have reviewed the triage vital signs and the nursing notes.   HISTORY  Chief Complaint Back Pain   HPI [name] is a 82 y.o. female with PMH of CKD, HLD, HTN, and prior SBO presents to the ED with low back pain radiating to the right hip.  She feels pain in the buttock area wrapping around to the anterior hip and inguinal crease.  No bulging.  No fevers.  No UTI symptoms.  Pain has been present for the past 2 weeks and gradually worsening.  Pain symptoms are worse with movement.  No numbness or weakness.  Pain does not radiate down the back of the right leg.  No recent falls.  She has been using lidocaine patch and methocarbamol for muscle relaxation and sleep which helps to some degree but symptoms continue and so she presents today for evaluation.   Past Medical History:  Diagnosis Date   Allergy    Anxiety    takes ativan as needed   Arthritis    Benign positional vertigo    Bursitis of shoulder, left    Cancer [facility])    Endometrial ca/Recurrence   CKD (chronic kidney disease), stage III (HCC)    Depression    Diabetes (HCC)    History of colonic polyps    Hypercholesteremia    Hypercholesterolemia    Hypertension    Lumbar pain    Osteoarthritis    Osteoporosis    S/P radiation therapy July 29, 2010-Sep 06, 2010   External beam of pelvis   S/P radiation therapy 09/15/10, 09/24/10, 09/28/2010   Intracavitary brachytherapy   SBO (small bowel obstruction) (HCC)    Status post chemotherapy    carboplatin/paclitaxel x 6 rounds    Review of Systems  Constitutional: No fever/chills Cardiovascular: Denies chest pain. Respiratory: Denies shortness of breath. Gastrointestinal: No abdominal pain.  Musculoskeletal: Positive back and right hip pain.  Skin: Negative for rash. Neurological: Negative for headaches.  ____________________________________________   PHYSICAL EXAM:  VITAL SIGNS: ED Triage Vitals [01/21/23 1216]   Encounter Vitals Group     BP (!) 176/88     Pulse Rate 83     Resp 18     Temp 97.9 F (36.6 C)     Temp Source Oral     SpO2 98 %     Weight 138 lb 14.2 oz (63 kg)     Height 5\' 4"  (1.626 m)   Constitutional: Alert and oriented. Well appearing and in no acute distress. Eyes: Conjunctivae are normal.  Head: Atraumatic. Nose: No congestion/rhinnorhea. Mouth/Throat: Mucous membranes are moist.  Neck: No stridor.   Cardiovascular: Normal rate, regular rhythm. Good peripheral circulation. Grossly normal heart sounds.   Respiratory: Normal respiratory effort.  No retractions. Lungs CTAB. Gastrointestinal: Soft and nontender. No distention.  Musculoskeletal: No lower extremity tenderness nor edema. No gross deformities of extremities. Normal ROM of the right hip, knee, and ankle.  Neurologic:  Normal speech and language. No gross focal neurologic deficits are appreciated.  Skin:  Skin is warm, dry and intact. No rash noted.  ____________________________________________   LABS (all labs ordered are listed, but only abnormal results are displayed)  Labs Reviewed  URINALYSIS, ROUTINE W REFLEX MICROSCOPIC    ____________________________________________   PROCEDURES  Procedure(s) performed:   Procedures  None ____________________________________________   INITIAL IMPRESSION / ASSESSMENT AND PLAN / ED COURSE  Pertinent labs & imaging results that were available during my care of  the patient were reviewed by me and considered in my medical decision making (see chart for details).   This patient is Presenting for Evaluation of hip/flank pain, which does require a range of treatment options, and is a complaint that involves a high risk of morbidity and mortality.  The Differential Diagnoses include muscle strain, fracture, dislocation, sciatica, etc.  I did obtain Additional Historical Information from daughter at bedside.   Clinical Laboratory Tests Ordered, included UA  without infection.   Radiologic Tests Ordered, included CT renal. I independently interpreted the images and agree with radiology interpretation.   Medical Decision Making: Summary:  Patient with right hip pain and low back pain. Given age and chronicity, plan for CT imaging and UA.   Reevaluation with update and discussion with patient. UA without UTI. No acute process on CT. No red-flag symptoms to prompt transfer or emergent MRI. Plan for [facility] back pain mgmt and PCP follow up.   Patient's presentation is most consistent with acute, uncomplicated illness.   Disposition: discharge  ____________________________________________  FINAL CLINICAL IMPRESSION(S) / ED DIAGNOSES  Final diagnoses:  Pain of right hip  Right flank pain     NEW OUTPATIENT MEDICATIONS STARTED DURING THIS VISIT:  Discharge Medication List as of 01/21/2023  2:15 PM     START taking these medications   Details  lidocaine (LIDODERM) 5 % Place 1 patch onto the skin daily. Remove & Discard patch within 12 hours or as directed by MD, Starting Sat 01/21/2023, Normal    methocarbamol (ROBAXIN) 500 MG tablet Take 1 tablet (500 mg total) by mouth every 8 (eight) hours as needed., Starting Sat 01/21/2023, Normal        Note:  This document was prepared using Dragon voice recognition software and may include unintentional dictation errors.  Alona Bene, MD, [facility]    Myangel Summons, Arlyss Repress, MD 01/31/23 [id number]

## 2023-01-21 NOTE — Discharge Instructions (Signed)

## 2023-01-21 NOTE — ED Triage Notes (Signed)
The patient has been having lower back pain that moves into her right hip for three weeks. She has been taking muscle relaxer's but it has been helping.

## 2023-01-26 DIAGNOSIS — M25551 Pain in right hip: Secondary | ICD-10-CM | POA: Diagnosis not present

## 2023-01-26 DIAGNOSIS — M545 Low back pain, unspecified: Secondary | ICD-10-CM | POA: Diagnosis not present

## 2023-02-10 DIAGNOSIS — Z23 Encounter for immunization: Secondary | ICD-10-CM | POA: Diagnosis not present

## 2023-02-20 ENCOUNTER — Inpatient Hospital Stay: Payer: Medicare Other | Attending: Family

## 2023-02-20 ENCOUNTER — Inpatient Hospital Stay (HOSPITAL_BASED_OUTPATIENT_CLINIC_OR_DEPARTMENT_OTHER): Payer: Medicare Other | Admitting: Hematology & Oncology

## 2023-02-20 VITALS — BP 123/58 | HR 86 | Temp 98.6°F | Resp 16 | Ht 64.0 in | Wt 137.0 lb

## 2023-02-20 DIAGNOSIS — D509 Iron deficiency anemia, unspecified: Secondary | ICD-10-CM | POA: Insufficient documentation

## 2023-02-20 DIAGNOSIS — D500 Iron deficiency anemia secondary to blood loss (chronic): Secondary | ICD-10-CM

## 2023-02-20 DIAGNOSIS — Z8542 Personal history of malignant neoplasm of other parts of uterus: Secondary | ICD-10-CM | POA: Diagnosis not present

## 2023-02-20 DIAGNOSIS — Z79899 Other long term (current) drug therapy: Secondary | ICD-10-CM | POA: Insufficient documentation

## 2023-02-20 LAB — CBC WITH DIFFERENTIAL (CANCER CENTER ONLY)
Abs Immature Granulocytes: 0.07 10*3/uL (ref 0.00–0.07)
Basophils Absolute: 0 10*3/uL (ref 0.0–0.1)
Basophils Relative: 1 %
Eosinophils Absolute: 0.5 10*3/uL (ref 0.0–0.5)
Eosinophils Relative: 8 %
HCT: 35.2 % — ABNORMAL LOW (ref 36.0–46.0)
Hemoglobin: 10.9 g/dL — ABNORMAL LOW (ref 12.0–15.0)
Immature Granulocytes: 1 %
Lymphocytes Relative: 21 %
Lymphs Abs: 1.3 10*3/uL (ref 0.7–4.0)
MCH: 26.8 pg (ref 26.0–34.0)
MCHC: 31 g/dL (ref 30.0–36.0)
MCV: 86.7 fL (ref 80.0–100.0)
Monocytes Absolute: 0.5 10*3/uL (ref 0.1–1.0)
Monocytes Relative: 8 %
Neutro Abs: 3.8 10*3/uL (ref 1.7–7.7)
Neutrophils Relative %: 61 %
Platelet Count: 332 10*3/uL (ref 150–400)
RBC: 4.06 MIL/uL (ref 3.87–5.11)
RDW: 15.1 % (ref 11.5–15.5)
WBC Count: 6.2 10*3/uL (ref 4.0–10.5)
nRBC: 0 % (ref 0.0–0.2)

## 2023-02-20 LAB — IRON AND IRON BINDING CAPACITY (CC-WL,HP ONLY)
Iron: 28 ug/dL (ref 28–170)
Saturation Ratios: 7 % — ABNORMAL LOW (ref 10.4–31.8)
TIBC: 389 ug/dL (ref 250–450)
UIBC: 361 ug/dL (ref 148–442)

## 2023-02-20 LAB — RETICULOCYTES
Immature Retic Fract: 20.1 % — ABNORMAL HIGH (ref 2.3–15.9)
RBC.: 4.03 MIL/uL (ref 3.87–5.11)
Retic Count, Absolute: 66.9 10*3/uL (ref 19.0–186.0)
Retic Ct Pct: 1.7 % (ref 0.4–3.1)

## 2023-02-20 LAB — FERRITIN: Ferritin: 6 ng/mL — ABNORMAL LOW (ref 11–307)

## 2023-02-20 NOTE — Progress Notes (Signed)
Hematology and Oncology Follow Up Visit  [name] [id number] 04-Jul-1940 82 y.o. 02/20/2023   Principle Diagnosis:  Iron deficiency anemia    Current Therapy:        IV iron as indicated  --Monoferric given on 11/25/2021   Interim History:  [name] is here today for follow-up.  We last saw her 6 months ago.  Since then, she been doing pretty well.  She has had no real complaints.  She has had no problems with COVID.  There has been no cough or shortness of breath.  She has had no obvious bleeding.  She does have GI issues.  She is on some MiraLAX to make sure that she does not have constipation.    Her last iron studies that were done back in April showed a ferritin of 22 with an iron saturation of 20%.  She has had no rashes.  There has been no leg swelling.  She has had no fever.  She has had no headache.  Overall, I would say that her performance status is probably ECOG 1.   Medications:  Allergies as of 02/20/2023       Reactions   Phenobarbital Other (See Comments)   Feeling of uneasiness        Medication List        Accurate as of February 20, 2023  9:18 AM. If you have any questions, ask your nurse or doctor.          aspirin 325 MG tablet Take 325 mg by mouth daily.   atorvastatin 40 MG tablet Commonly known as: LIPITOR Take 40 mg by mouth at bedtime.   benazepril 40 MG tablet Commonly known as: LOTENSIN Take 40 mg by mouth daily.   cetirizine 10 MG tablet Commonly known as: ZYRTEC Take 10 mg by mouth daily as needed.   clobetasol ointment 0.05 % Commonly known as: TEMOVATE Apply 1 Application topically 2 (two) times a week.   Folbee Plus Tabs Take 1 tablet by mouth daily.   lidocaine 5 % Commonly known as: Lidoderm Place 1 patch onto the skin daily. Remove & Discard patch within 12 hours or as directed by MD   LORazepam 0.5 MG tablet Commonly known as: ATIVAN Take 0.25 mg by mouth as needed for anxiety.   meclizine 25 MG  tablet Commonly known as: ANTIVERT Take 12.5 mg by mouth 2 (two) times daily.   methocarbamol 500 MG tablet Commonly known as: ROBAXIN Take 1 tablet (500 mg total) by mouth every 8 (eight) hours as needed.   multivitamin tablet Take 1 tablet by mouth daily.   naproxen sodium 220 MG tablet Commonly known as: ALEVE Take 220 mg by mouth.   omeprazole 40 MG capsule Commonly known as: PRILOSEC TAKE 1 CAPSULE (40 MG TOTAL) BY MOUTH DAILY.   Polyethyl Glycol-Propyl Glycol 0.4-0.3 % Soln Place 1 drop into both eyes daily as needed (for dry eyes).   polyethylene glycol 17 g packet Commonly known as: MIRALAX / GLYCOLAX Take 17 g by mouth daily as needed for mild constipation or moderate constipation.   Vitamin D 125 MCG (5000 UT) Caps Take 5,000 Units by mouth daily.        Allergies:  Allergies  Allergen Reactions   Phenobarbital Other (See Comments)    Feeling of uneasiness    Past Medical History, Surgical history, Social history, and Family History were reviewed and updated.  Review of Systems: All other 10 point review of systems is negative.  Physical Exam:  height is 5\' 4"  (1.626 m) and weight is 137 lb (62.1 kg). Her oral temperature is 98.6 F (37 C). Her blood pressure is 123/58 (abnormal) and her pulse is 86. Her respiration is 16 and oxygen saturation is 96%.   Wt Readings from Last 3 Encounters:  02/20/23 137 lb (62.1 kg)  01/21/23 138 lb 14.2 oz (63 kg)  11/02/22 140 lb 6.9 oz (63.7 kg)    Ocular: Sclerae unicteric, pupils equal, round and reactive to light Ear-nose-throat: Oropharynx clear, dentition fair Lymphatic: No cervical or supraclavicular adenopathy Lungs no rales or rhonchi, good excursion bilaterally Heart regular rate and rhythm, no murmur appreciated Abd soft, nontender, positive bowel sounds MSK no focal spinal tenderness, no joint edema Neuro: non-focal, well-oriented, appropriate affect Breasts: Deferred   Lab Results  Component  Value Date   WBC 6.2 02/20/2023   HGB 10.9 (L) 02/20/2023   HCT 35.2 (L) 02/20/2023   MCV 86.7 02/20/2023   PLT 332 02/20/2023   Lab Results  Component Value Date   FERRITIN 22 08/22/2022   IRON 68 08/22/2022   TIBC 347 08/22/2022   UIBC 279 08/22/2022   IRONPCTSAT 20 08/22/2022   Lab Results  Component Value Date   RETICCTPCT 1.7 02/20/2023   RBC 4.06 02/20/2023   No results found for: "KPAFRELGTCHN", "LAMBDASER", "KAPLAMBRATIO" No results found for: "IGGSERUM", "IGA", "IGMSERUM" No results found for: "TOTALPROTELP", "ALBUMINELP", "A1GS", "A2GS", "BETS", "BETA2SER", "GAMS", "MSPIKE", "SPEI"   Chemistry      Component Value Date/Time   NA 139 11/02/2022 0611   K 3.5 11/02/2022 0611   CL 108 11/02/2022 0611   CO2 26 11/02/2022 0611   BUN 16 11/02/2022 0611   CREATININE 0.78 11/02/2022 0611   CREATININE 1.08 (H) 11/22/2021 1042   GLU 324 (H) 03/02/2009 1548      Component Value Date/Time   CALCIUM 8.2 (L) 11/02/2022 0611   ALKPHOS 62 11/01/2022 1858   AST 22 11/01/2022 1858   AST 20 11/22/2021 1042   ALT 25 11/01/2022 1858   ALT 20 11/22/2021 1042   BILITOT 0.6 11/01/2022 1858   BILITOT 0.3 11/22/2021 1042       Impression and Plan: [name] is a very pleasant 82 yo caucasian female with history of iron deficiency anemia.  We will see what her iron studies look like.  Last time, they were borderline.  Her MCV is dropping a little bit.  Her hemoglobin is little bit down.  It would not surprise me if she needed some IV iron..  Otherwise, we will plan to get her back in a bit sooner than 6 months.  We probably need to get her back in 3 months.  Jessica Macho, MD 10/28/20249:18 AM

## 2023-02-27 ENCOUNTER — Inpatient Hospital Stay: Payer: Medicare Other | Attending: Hematology & Oncology

## 2023-02-27 VITALS — BP 143/72 | HR 72 | Temp 98.2°F | Resp 16

## 2023-02-27 DIAGNOSIS — D509 Iron deficiency anemia, unspecified: Secondary | ICD-10-CM | POA: Diagnosis not present

## 2023-02-27 DIAGNOSIS — Z79899 Other long term (current) drug therapy: Secondary | ICD-10-CM | POA: Diagnosis not present

## 2023-02-27 DIAGNOSIS — Z8542 Personal history of malignant neoplasm of other parts of uterus: Secondary | ICD-10-CM | POA: Insufficient documentation

## 2023-02-27 DIAGNOSIS — D500 Iron deficiency anemia secondary to blood loss (chronic): Secondary | ICD-10-CM

## 2023-02-27 MED ORDER — SODIUM CHLORIDE 0.9 % IV SOLN: Freq: Once | INTRAVENOUS | Status: AC

## 2023-02-27 MED ORDER — SODIUM CHLORIDE 0.9 % IV SOLN
1000.0000 mg | Freq: Once | INTRAVENOUS | Status: AC
  Administered 2023-02-27: 1000 mg via INTRAVENOUS
  Filled 2023-02-27: qty 10

## 2023-02-27 NOTE — Patient Instructions (Signed)

## 2023-03-02 DIAGNOSIS — Z1231 Encounter for screening mammogram for malignant neoplasm of breast: Secondary | ICD-10-CM | POA: Diagnosis not present

## 2023-03-22 ENCOUNTER — Other Ambulatory Visit (HOSPITAL_COMMUNITY): Payer: Self-pay | Admitting: *Deleted

## 2023-03-27 ENCOUNTER — Ambulatory Visit (HOSPITAL_COMMUNITY)
Admission: RE | Admit: 2023-03-27 | Discharge: 2023-03-27 | Disposition: A | Discharge status: Home / Self Care | Payer: Medicare Other | Source: Ambulatory Visit | Attending: Internal Medicine | Admitting: Internal Medicine

## 2023-03-27 DIAGNOSIS — M810 Age-related osteoporosis without current pathological fracture: Secondary | ICD-10-CM | POA: Diagnosis not present

## 2023-03-27 MED ORDER — DENOSUMAB 60 MG/ML ~~LOC~~ SOSY
60.0000 mg | PREFILLED_SYRINGE | Freq: Once | SUBCUTANEOUS | Status: AC
  Administered 2023-03-27: 60 mg via SUBCUTANEOUS

## 2023-03-27 MED ORDER — DENOSUMAB 60 MG/ML ~~LOC~~ SOSY
PREFILLED_SYRINGE | SUBCUTANEOUS | Status: AC
  Filled 2023-03-27: qty 1

## 2023-04-05 DIAGNOSIS — F418 Other specified anxiety disorders: Secondary | ICD-10-CM | POA: Diagnosis not present

## 2023-04-05 DIAGNOSIS — M810 Age-related osteoporosis without current pathological fracture: Secondary | ICD-10-CM | POA: Diagnosis not present

## 2023-04-05 DIAGNOSIS — E7800 Pure hypercholesterolemia, unspecified: Secondary | ICD-10-CM | POA: Diagnosis not present

## 2023-04-05 DIAGNOSIS — D509 Iron deficiency anemia, unspecified: Secondary | ICD-10-CM | POA: Diagnosis not present

## 2023-04-05 DIAGNOSIS — E46 Unspecified protein-calorie malnutrition: Secondary | ICD-10-CM | POA: Diagnosis not present

## 2023-04-05 DIAGNOSIS — C541 Malignant neoplasm of endometrium: Secondary | ICD-10-CM | POA: Diagnosis not present

## 2023-04-05 DIAGNOSIS — M1990 Unspecified osteoarthritis, unspecified site: Secondary | ICD-10-CM | POA: Diagnosis not present

## 2023-04-05 DIAGNOSIS — N1831 Chronic kidney disease, stage 3a: Secondary | ICD-10-CM | POA: Diagnosis not present

## 2023-04-05 DIAGNOSIS — E1129 Type 2 diabetes mellitus with other diabetic kidney complication: Secondary | ICD-10-CM | POA: Diagnosis not present

## 2023-04-05 DIAGNOSIS — I129 Hypertensive chronic kidney disease with stage 1 through stage 4 chronic kidney disease, or unspecified chronic kidney disease: Secondary | ICD-10-CM | POA: Diagnosis not present

## 2023-04-05 DIAGNOSIS — H9193 Unspecified hearing loss, bilateral: Secondary | ICD-10-CM | POA: Diagnosis not present

## 2023-04-05 DIAGNOSIS — S329XXS Fracture of unspecified parts of lumbosacral spine and pelvis, sequela: Secondary | ICD-10-CM | POA: Diagnosis not present

## 2023-04-06 LAB — LAB REPORT - SCANNED
A1c: 6.3
EGFR: 59.8

## 2023-05-16 ENCOUNTER — Encounter: Payer: Self-pay | Admitting: Family

## 2023-05-23 ENCOUNTER — Inpatient Hospital Stay: Payer: Medicare Other | Attending: Hematology & Oncology

## 2023-05-23 ENCOUNTER — Encounter: Payer: Self-pay | Admitting: Family

## 2023-05-23 ENCOUNTER — Encounter: Payer: Self-pay | Admitting: Medical Oncology

## 2023-05-23 ENCOUNTER — Inpatient Hospital Stay (HOSPITAL_BASED_OUTPATIENT_CLINIC_OR_DEPARTMENT_OTHER): Payer: Medicare Other | Admitting: Medical Oncology

## 2023-05-23 VITALS — BP 139/62 | HR 80 | Temp 98.2°F | Resp 18 | Ht 64.0 in | Wt 139.8 lb

## 2023-05-23 DIAGNOSIS — D500 Iron deficiency anemia secondary to blood loss (chronic): Secondary | ICD-10-CM | POA: Diagnosis not present

## 2023-05-23 DIAGNOSIS — D509 Iron deficiency anemia, unspecified: Secondary | ICD-10-CM | POA: Insufficient documentation

## 2023-05-23 DIAGNOSIS — Z79899 Other long term (current) drug therapy: Secondary | ICD-10-CM | POA: Diagnosis not present

## 2023-05-23 DIAGNOSIS — Z8542 Personal history of malignant neoplasm of other parts of uterus: Secondary | ICD-10-CM | POA: Insufficient documentation

## 2023-05-23 LAB — CBC WITH DIFFERENTIAL (CANCER CENTER ONLY)
Abs Immature Granulocytes: 0.01 10*3/uL (ref 0.00–0.07)
Basophils Absolute: 0.1 10*3/uL (ref 0.0–0.1)
Basophils Relative: 1 %
Eosinophils Absolute: 0.4 10*3/uL (ref 0.0–0.5)
Eosinophils Relative: 5 %
HCT: 40 % (ref 36.0–46.0)
Hemoglobin: 12.6 g/dL (ref 12.0–15.0)
Immature Granulocytes: 0 %
Lymphocytes Relative: 28 %
Lymphs Abs: 2 10*3/uL (ref 0.7–4.0)
MCH: 29 pg (ref 26.0–34.0)
MCHC: 31.5 g/dL (ref 30.0–36.0)
MCV: 92.2 fL (ref 80.0–100.0)
Monocytes Absolute: 0.6 10*3/uL (ref 0.1–1.0)
Monocytes Relative: 8 %
Neutro Abs: 4.2 10*3/uL (ref 1.7–7.7)
Neutrophils Relative %: 58 %
Platelet Count: 302 10*3/uL (ref 150–400)
RBC: 4.34 MIL/uL (ref 3.87–5.11)
RDW: 16.4 % — ABNORMAL HIGH (ref 11.5–15.5)
WBC Count: 7.3 10*3/uL (ref 4.0–10.5)
nRBC: 0 % (ref 0.0–0.2)

## 2023-05-23 LAB — IRON AND IRON BINDING CAPACITY (CC-WL,HP ONLY)
Iron: 55 ug/dL (ref 28–170)
Saturation Ratios: 16 % (ref 10.4–31.8)
TIBC: 339 ug/dL (ref 250–450)
UIBC: 284 ug/dL (ref 148–442)

## 2023-05-23 LAB — RETICULOCYTES
Immature Retic Fract: 12.6 % (ref 2.3–15.9)
RBC.: 4.39 MIL/uL (ref 3.87–5.11)
Retic Count, Absolute: 88.2 10*3/uL (ref 19.0–186.0)
Retic Ct Pct: 2 % (ref 0.4–3.1)

## 2023-05-23 LAB — FERRITIN: Ferritin: 47 ng/mL (ref 11–307)

## 2023-05-23 NOTE — Progress Notes (Signed)
Hematology and Oncology Follow Up Visit  [name] [id number] 1940/10/29 83 y.o. 05/23/2023   Principle Diagnosis:  Iron deficiency anemia    Current Therapy:        IV iron as indicated  --Monoferric given on 11/25/2021   Interim History:  [name] is here today for follow-up.   There has been no cough or shortness of breath.  No new constipation.   There has been no bleeding to [name] knowledge: denies epistaxis, gingivitis, hemoptysis, hematemesis, hematuria, melena, excessive bruising, blood donation.   [name] is followed by Dr. Marina Goodell with [facility]. [name] is UTD on [name] colonoscopy/endoscopy.   [name] last iron studies that were done back in October showed a ferritin of 6 with an iron saturation of 7%. [name] received a dose of IV Monoferric since then.   [name] has had no rashes.  There has been no leg swelling.  [name] has had no fever.  [name] has had no headache.  Overall, I would say that [name] performance status is probably ECOG 1.   Wt Readings from Last 3 Encounters:  05/23/23 139 lb 12.8 oz (63.4 kg)  02/20/23 137 lb (62.1 kg)  01/21/23 138 lb 14.2 oz (63 kg)   Medications:  Allergies as of 05/23/2023       Reactions   Phenobarbital Other (See Comments)   Feeling of uneasiness        Medication List        Accurate as of May 23, 2023  9:15 AM. If you have any questions, ask your nurse or doctor.          aspirin 325 MG tablet Take 325 mg by mouth daily.   atorvastatin 40 MG tablet Commonly known as: LIPITOR Take 40 mg by mouth at bedtime.   benazepril 40 MG tablet Commonly known as: LOTENSIN Take 40 mg by mouth daily.   cetirizine 10 MG tablet Commonly known as: ZYRTEC Take 10 mg by mouth daily as needed.   clobetasol ointment 0.05 % Commonly known as: TEMOVATE Apply 1 Application topically 2 (two) times a week.   Folbee Plus Tabs Take 1 tablet by mouth daily.   Janumet XR [phone] MG Tb24 Generic drug: SitaGLIPtin-MetFORMIN HCl Take 1 tablet  by mouth every evening.   lidocaine 5 % Commonly known as: Lidoderm Place 1 patch onto the skin daily. Remove & Discard patch within 12 hours or as directed by MD   LORazepam 0.5 MG tablet Commonly known as: ATIVAN Take 0.25 mg by mouth as needed for anxiety.   meclizine 25 MG tablet Commonly known as: ANTIVERT Take 12.5 mg by mouth 2 (two) times daily.   methocarbamol 500 MG tablet Commonly known as: ROBAXIN Take 1 tablet (500 mg total) by mouth every 8 (eight) hours as needed.   multivitamin tablet Take 1 tablet by mouth daily.   naproxen sodium 220 MG tablet Commonly known as: ALEVE Take 220 mg by mouth.   omeprazole 40 MG capsule Commonly known as: PRILOSEC TAKE 1 CAPSULE (40 MG TOTAL) BY MOUTH DAILY.   Polyethyl Glycol-Propyl Glycol 0.4-0.3 % Soln Place 1 drop into both eyes daily as needed (for dry eyes).   polyethylene glycol 17 g packet Commonly known as: MIRALAX / GLYCOLAX Take 17 g by mouth daily as needed for mild constipation or moderate constipation.   Vitamin D 125 MCG (5000 UT) Caps Take 5,000 Units by mouth daily.        Allergies:  Allergies  Allergen Reactions  Phenobarbital Other (See Comments)    Feeling of uneasiness    Past Medical History, Surgical history, Social history, and Family History were reviewed and updated.  Review of Systems: All other 10 point review of systems is negative.   Physical Exam:  height is 5\' 4"  (1.626 m) and weight is 139 lb 12.8 oz (63.4 kg). [name] oral temperature is 98.2 F (36.8 C). [name] blood pressure is 139/62 and [name] pulse is 80. [name] respiration is 18 and oxygen saturation is 100%.   Wt Readings from Last 3 Encounters:  05/23/23 139 lb 12.8 oz (63.4 kg)  02/20/23 137 lb (62.1 kg)  01/21/23 138 lb 14.2 oz (63 kg)    Ocular: Sclerae unicteric, pupils equal, round and reactive to light Ear-nose-throat: Oropharynx clear, dentition fair Lymphatic: No cervical or supraclavicular adenopathy Lungs no  rales or rhonchi, good excursion bilaterally Heart regular rate and rhythm, no murmur appreciated Abd soft, nontender, positive bowel sounds MSK no focal spinal tenderness, no joint edema Neuro: non-focal, well-oriented, appropriate affect  Lab Results  Component Value Date   WBC 7.3 05/23/2023   HGB 12.6 05/23/2023   HCT 40.0 05/23/2023   MCV 92.2 05/23/2023   PLT 302 05/23/2023   Lab Results  Component Value Date   FERRITIN 6 (L) 02/20/2023   IRON 28 02/20/2023   TIBC 389 02/20/2023   UIBC 361 02/20/2023   IRONPCTSAT 7 (L) 02/20/2023   Lab Results  Component Value Date   RETICCTPCT 2.0 05/23/2023   RBC 4.39 05/23/2023   No results found for: "KPAFRELGTCHN", "LAMBDASER", "KAPLAMBRATIO" No results found for: "IGGSERUM", "IGA", "IGMSERUM" No results found for: "TOTALPROTELP", "ALBUMINELP", "A1GS", "A2GS", "BETS", "BETA2SER", "GAMS", "MSPIKE", "SPEI"   Chemistry      Component Value Date/Time   NA 139 11/02/2022 0611   K 3.5 11/02/2022 0611   CL 108 11/02/2022 0611   CO2 26 11/02/2022 0611   BUN 16 11/02/2022 0611   CREATININE 0.78 11/02/2022 0611   CREATININE 1.08 (H) 11/22/2021 1042   GLU 324 (H) 03/02/2009 1548      Component Value Date/Time   CALCIUM 8.2 (L) 11/02/2022 0611   ALKPHOS 62 11/01/2022 1858   AST 22 11/01/2022 1858   AST 20 11/22/2021 1042   ALT 25 11/01/2022 1858   ALT 20 11/22/2021 1042   BILITOT 0.6 11/01/2022 1858   BILITOT 0.3 11/22/2021 1042     No diagnosis found.  Impression and Plan: [name] is a very pleasant 83 yo caucasian female with history of iron deficiency anemia which is suspected to be due to chronic slow bleeding-"GI seepage". [name] receives IV iron when needed. [name] last infusion was on 02/27/2023.   Today [name] Hgb is normal at 12.6. MCV is also normal at 92.2. Retic panel is normal as well.  Iron studies pending. Will replenish as needed  RTC 6 months APP, labs (CBC, ferritin, iron, retic)  Rushie Chestnut,  PA-C 1/28/20259:15 AM

## 2023-05-25 LAB — COLD AGGLUTININ TITER: Cold Agglutinin Titer: NEGATIVE

## 2023-06-02 ENCOUNTER — Other Ambulatory Visit: Payer: Self-pay | Admitting: Internal Medicine

## 2023-06-09 DIAGNOSIS — N1831 Chronic kidney disease, stage 3a: Secondary | ICD-10-CM | POA: Diagnosis not present

## 2023-06-09 DIAGNOSIS — E7800 Pure hypercholesterolemia, unspecified: Secondary | ICD-10-CM | POA: Diagnosis not present

## 2023-06-09 DIAGNOSIS — E1129 Type 2 diabetes mellitus with other diabetic kidney complication: Secondary | ICD-10-CM | POA: Diagnosis not present

## 2023-06-09 DIAGNOSIS — E46 Unspecified protein-calorie malnutrition: Secondary | ICD-10-CM | POA: Diagnosis not present

## 2023-06-09 DIAGNOSIS — I129 Hypertensive chronic kidney disease with stage 1 through stage 4 chronic kidney disease, or unspecified chronic kidney disease: Secondary | ICD-10-CM | POA: Diagnosis not present

## 2023-06-09 DIAGNOSIS — M810 Age-related osteoporosis without current pathological fracture: Secondary | ICD-10-CM | POA: Diagnosis not present

## 2023-06-09 DIAGNOSIS — D509 Iron deficiency anemia, unspecified: Secondary | ICD-10-CM | POA: Diagnosis not present

## 2023-06-15 DIAGNOSIS — M5030 Other cervical disc degeneration, unspecified cervical region: Secondary | ICD-10-CM | POA: Diagnosis not present

## 2023-06-15 DIAGNOSIS — E1129 Type 2 diabetes mellitus with other diabetic kidney complication: Secondary | ICD-10-CM | POA: Diagnosis not present

## 2023-06-15 DIAGNOSIS — M810 Age-related osteoporosis without current pathological fracture: Secondary | ICD-10-CM | POA: Diagnosis not present

## 2023-06-15 DIAGNOSIS — Z1331 Encounter for screening for depression: Secondary | ICD-10-CM | POA: Diagnosis not present

## 2023-06-15 DIAGNOSIS — N1831 Chronic kidney disease, stage 3a: Secondary | ICD-10-CM | POA: Diagnosis not present

## 2023-06-15 DIAGNOSIS — C541 Malignant neoplasm of endometrium: Secondary | ICD-10-CM | POA: Diagnosis not present

## 2023-06-15 DIAGNOSIS — Z0000 Encounter for general adult medical examination without abnormal findings: Secondary | ICD-10-CM | POA: Diagnosis not present

## 2023-06-15 DIAGNOSIS — H9193 Unspecified hearing loss, bilateral: Secondary | ICD-10-CM | POA: Diagnosis not present

## 2023-06-15 DIAGNOSIS — F418 Other specified anxiety disorders: Secondary | ICD-10-CM | POA: Diagnosis not present

## 2023-06-15 DIAGNOSIS — D509 Iron deficiency anemia, unspecified: Secondary | ICD-10-CM | POA: Diagnosis not present

## 2023-06-15 DIAGNOSIS — K219 Gastro-esophageal reflux disease without esophagitis: Secondary | ICD-10-CM | POA: Diagnosis not present

## 2023-06-15 DIAGNOSIS — Z1339 Encounter for screening examination for other mental health and behavioral disorders: Secondary | ICD-10-CM | POA: Diagnosis not present

## 2023-06-15 DIAGNOSIS — S329XXS Fracture of unspecified parts of lumbosacral spine and pelvis, sequela: Secondary | ICD-10-CM | POA: Diagnosis not present

## 2023-06-15 DIAGNOSIS — I129 Hypertensive chronic kidney disease with stage 1 through stage 4 chronic kidney disease, or unspecified chronic kidney disease: Secondary | ICD-10-CM | POA: Diagnosis not present

## 2023-06-15 DIAGNOSIS — E119 Type 2 diabetes mellitus without complications: Secondary | ICD-10-CM | POA: Diagnosis not present

## 2023-06-15 DIAGNOSIS — K5520 Angiodysplasia of colon without hemorrhage: Secondary | ICD-10-CM | POA: Diagnosis not present

## 2023-09-01 DIAGNOSIS — F418 Other specified anxiety disorders: Secondary | ICD-10-CM | POA: Diagnosis not present

## 2023-09-01 DIAGNOSIS — E559 Vitamin D deficiency, unspecified: Secondary | ICD-10-CM | POA: Diagnosis not present

## 2023-09-01 DIAGNOSIS — M5030 Other cervical disc degeneration, unspecified cervical region: Secondary | ICD-10-CM | POA: Diagnosis not present

## 2023-09-01 DIAGNOSIS — E1129 Type 2 diabetes mellitus with other diabetic kidney complication: Secondary | ICD-10-CM | POA: Diagnosis not present

## 2023-09-01 DIAGNOSIS — H9193 Unspecified hearing loss, bilateral: Secondary | ICD-10-CM | POA: Diagnosis not present

## 2023-09-01 DIAGNOSIS — N1831 Chronic kidney disease, stage 3a: Secondary | ICD-10-CM | POA: Diagnosis not present

## 2023-09-01 DIAGNOSIS — S329XXS Fracture of unspecified parts of lumbosacral spine and pelvis, sequela: Secondary | ICD-10-CM | POA: Diagnosis not present

## 2023-09-01 DIAGNOSIS — C541 Malignant neoplasm of endometrium: Secondary | ICD-10-CM | POA: Diagnosis not present

## 2023-09-01 DIAGNOSIS — I129 Hypertensive chronic kidney disease with stage 1 through stage 4 chronic kidney disease, or unspecified chronic kidney disease: Secondary | ICD-10-CM | POA: Diagnosis not present

## 2023-09-01 DIAGNOSIS — M810 Age-related osteoporosis without current pathological fracture: Secondary | ICD-10-CM | POA: Diagnosis not present

## 2023-09-01 DIAGNOSIS — K5520 Angiodysplasia of colon without hemorrhage: Secondary | ICD-10-CM | POA: Diagnosis not present

## 2023-09-01 DIAGNOSIS — E46 Unspecified protein-calorie malnutrition: Secondary | ICD-10-CM | POA: Diagnosis not present

## 2023-09-21 ENCOUNTER — Telehealth: Payer: Self-pay

## 2023-09-21 NOTE — Telephone Encounter (Signed)
 Auth Submission: NO AUTH NEEDED Site of care: Site of care: [facility] Payer: Medicare A/B with [employer] supplement Medication & CPT/J Code(s) submitted: Prolia  (Denosumab ) [phone] Route of submission (phone, fax, portal):  Phone # Fax # Auth type: Buy/Bill PB Units/visits requested: 60mg  x 2 doses Reference number:  Approval from: 09/21/23 to 05/25/24

## 2023-09-25 ENCOUNTER — Other Ambulatory Visit (HOSPITAL_COMMUNITY): Payer: Self-pay | Admitting: *Deleted

## 2023-09-26 ENCOUNTER — Ambulatory Visit (HOSPITAL_COMMUNITY)
Admission: RE | Admit: 2023-09-26 | Discharge: 2023-09-26 | Disposition: A | Discharge status: Home / Self Care | Source: Ambulatory Visit | Attending: Internal Medicine | Admitting: Internal Medicine

## 2023-09-26 VITALS — BP 133/64 | HR 92 | Temp 97.5°F | Resp 17

## 2023-09-26 DIAGNOSIS — M810 Age-related osteoporosis without current pathological fracture: Secondary | ICD-10-CM | POA: Insufficient documentation

## 2023-09-26 MED ORDER — DENOSUMAB 60 MG/ML ~~LOC~~ SOSY
60.0000 mg | PREFILLED_SYRINGE | Freq: Once | SUBCUTANEOUS | Status: AC
  Administered 2023-09-26: 60 mg via SUBCUTANEOUS

## 2023-09-26 MED ORDER — DENOSUMAB 60 MG/ML ~~LOC~~ SOSY
PREFILLED_SYRINGE | SUBCUTANEOUS | Status: AC
  Filled 2023-09-26: qty 1

## 2023-09-28 DIAGNOSIS — L578 Other skin changes due to chronic exposure to nonionizing radiation: Secondary | ICD-10-CM | POA: Diagnosis not present

## 2023-09-28 DIAGNOSIS — L2089 Other atopic dermatitis: Secondary | ICD-10-CM | POA: Diagnosis not present

## 2023-09-28 DIAGNOSIS — L814 Other melanin hyperpigmentation: Secondary | ICD-10-CM | POA: Diagnosis not present

## 2023-09-28 DIAGNOSIS — L821 Other seborrheic keratosis: Secondary | ICD-10-CM | POA: Diagnosis not present

## 2023-10-17 NOTE — Addendum Note (Signed)
 Encounter addended by: Marshel Allean MATSU, RN on: 10/17/2023 1:32 PM  Actions taken: Order Reconciliation Section accessed

## 2023-10-20 MED FILL — Denosumab Inj Soln Prefilled Syringe 60 MG/ML: SUBCUTANEOUS | Qty: 1 | Status: AC

## 2023-10-20 NOTE — Addendum Note (Signed)
 Encounter addended by: Layman Elspeth BRAVO, RN on: 10/20/2023 10:16 AM  Actions taken: Charge Capture section accepted

## 2023-11-21 ENCOUNTER — Encounter: Payer: Self-pay | Admitting: Medical Oncology

## 2023-11-21 ENCOUNTER — Inpatient Hospital Stay (HOSPITAL_BASED_OUTPATIENT_CLINIC_OR_DEPARTMENT_OTHER): Payer: Medicare Other | Admitting: Medical Oncology

## 2023-11-21 ENCOUNTER — Inpatient Hospital Stay: Payer: Medicare Other | Attending: Medical Oncology

## 2023-11-21 VITALS — BP 147/63 | HR 89 | Temp 98.6°F | Resp 18 | Ht 64.0 in | Wt 139.1 lb

## 2023-11-21 DIAGNOSIS — D509 Iron deficiency anemia, unspecified: Secondary | ICD-10-CM | POA: Insufficient documentation

## 2023-11-21 DIAGNOSIS — Z8542 Personal history of malignant neoplasm of other parts of uterus: Secondary | ICD-10-CM | POA: Diagnosis not present

## 2023-11-21 DIAGNOSIS — Z79899 Other long term (current) drug therapy: Secondary | ICD-10-CM | POA: Insufficient documentation

## 2023-11-21 DIAGNOSIS — D500 Iron deficiency anemia secondary to blood loss (chronic): Secondary | ICD-10-CM

## 2023-11-21 LAB — IRON AND IRON BINDING CAPACITY (CC-WL,HP ONLY)
Iron: 38 ug/dL (ref 28–170)
Saturation Ratios: 9 % — ABNORMAL LOW (ref 10.4–31.8)
TIBC: 437 ug/dL (ref 250–450)
UIBC: 399 ug/dL

## 2023-11-21 LAB — CBC
HCT: 35.4 % — ABNORMAL LOW (ref 36.0–46.0)
Hemoglobin: 10.9 g/dL — ABNORMAL LOW (ref 12.0–15.0)
MCH: 26.7 pg (ref 26.0–34.0)
MCHC: 30.8 g/dL (ref 30.0–36.0)
MCV: 86.6 fL (ref 80.0–100.0)
Platelets: 337 K/uL (ref 150–400)
RBC: 4.09 MIL/uL (ref 3.87–5.11)
RDW: 15.4 % (ref 11.5–15.5)
WBC: 6.5 K/uL (ref 4.0–10.5)
nRBC: 0 % (ref 0.0–0.2)

## 2023-11-21 LAB — RETIC PANEL
Immature Retic Fract: 17 % — ABNORMAL HIGH (ref 2.3–15.9)
RBC.: 4.16 MIL/uL (ref 3.87–5.11)
Retic Count, Absolute: 56.6 K/uL (ref 19.0–186.0)
Retic Ct Pct: 1.4 % (ref 0.4–3.1)
Reticulocyte Hemoglobin: 25.8 pg — ABNORMAL LOW (ref >27.9)

## 2023-11-21 LAB — FERRITIN: Ferritin: 11 ng/mL (ref 11–307)

## 2023-11-21 NOTE — Progress Notes (Signed)
 Hematology and Oncology Follow Up Visit  [name] [id number] 1941-01-10 83 y.o. 11/21/2023   Principle Diagnosis:  Iron deficiency anemia    Current Therapy:        IV iron as indicated  --Monoferric - last given 02/27/2023   Interim History:  [name] is here today for follow-up.   Today she states that she has been ok. No concerns or health changes for her to report.   There has been no cough or shortness of breath.  No new constipation.   There has been no bleeding to her knowledge: denies epistaxis, gingivitis, hemoptysis, hematemesis, hematuria, melena, excessive bruising, blood donation.   She is followed by [name] with [facility]. She is UTD on her colonoscopy/endoscopy.   Her last iron studies that were done back in January showed a ferritin of 47 with an iron saturation of 16%. Her last dose of Monoferric  was on 02/27/2023  She has had no rashes.  There has been no leg swelling.  She has had no fever.  She has had no headache.  Overall, I would say that her performance status is probably ECOG 1.   Wt Readings from Last 3 Encounters:  11/21/23 139 lb 1.3 oz (63.1 kg)  05/23/23 139 lb 12.8 oz (63.4 kg)  02/20/23 137 lb (62.1 kg)   Medications:  Allergies as of 11/21/2023       Reactions   Phenobarbital Other (See Comments)   Feeling of uneasiness        Medication List        Accurate as of November 21, 2023  9:24 AM. If you have any questions, ask your nurse or doctor.          aspirin  325 MG tablet Take 325 mg by mouth daily.   atorvastatin  40 MG tablet Commonly known as: LIPITOR Take 40 mg by mouth at bedtime.   benazepril  40 MG tablet Commonly known as: LOTENSIN  Take 40 mg by mouth daily.   cetirizine 10 MG tablet Commonly known as: ZYRTEC Take 10 mg by mouth daily as needed.   clobetasol  ointment 0.05 % Commonly known as: TEMOVATE  Apply 1 Application topically 2 (two) times a week.   Folbee Plus Tabs Take 1 tablet by mouth  daily.   Janumet XR [phone] MG Tb24 Generic drug: SitaGLIPtin-MetFORMIN HCl Take 1 tablet by mouth every evening.   lidocaine  5 % Commonly known as: Lidoderm  Place 1 patch onto the skin daily. Remove & Discard patch within 12 hours or as directed by MD   LORazepam  0.5 MG tablet Commonly known as: ATIVAN  Take 0.25 mg by mouth as needed for anxiety.   meclizine  25 MG tablet Commonly known as: ANTIVERT  Take 12.5 mg by mouth 2 (two) times daily.   methocarbamol  500 MG tablet Commonly known as: ROBAXIN  Take 1 tablet (500 mg total) by mouth every 8 (eight) hours as needed.   multivitamin tablet Take 1 tablet by mouth daily.   naproxen sodium 220 MG tablet Commonly known as: ALEVE Take 220 mg by mouth.   omeprazole  40 MG capsule Commonly known as: PRILOSEC TAKE 1 CAPSULE (40 MG TOTAL) BY MOUTH DAILY.   Polyethyl Glycol-Propyl Glycol 0.4-0.3 % Soln Place 1 drop into both eyes daily as needed (for dry eyes).   polyethylene glycol 17 g packet Commonly known as: MIRALAX  / GLYCOLAX  Take 17 g by mouth daily as needed for mild constipation or moderate constipation.   Vitamin D  125 MCG (5000 UT) Caps Take 5,000 Units  by mouth daily.        Allergies:  Allergies  Allergen Reactions   Phenobarbital Other (See Comments)    Feeling of uneasiness    Past Medical History, Surgical history, Social history, and Family History were reviewed and updated.  Review of Systems: All other 10 point review of systems is negative.   Physical Exam:  height is 5' 4 (1.626 m) and weight is 139 lb 1.3 oz (63.1 kg). Her oral temperature is 98.6 F (37 C). Her blood pressure is 147/63 (abnormal) and her pulse is 89. Her respiration is 18 and oxygen saturation is 100%.   Wt Readings from Last 3 Encounters:  11/21/23 139 lb 1.3 oz (63.1 kg)  05/23/23 139 lb 12.8 oz (63.4 kg)  02/20/23 137 lb (62.1 kg)    Ocular: Sclerae unicteric, pupils equal, round and reactive to  light Ear-nose-throat: Oropharynx clear, dentition fair Lymphatic: No cervical or supraclavicular adenopathy Lungs no rales or rhonchi, good excursion bilaterally Heart regular rate and rhythm, no murmur appreciated Abd soft, nontender, positive bowel sounds MSK no focal spinal tenderness, no joint edema Neuro: non-focal, well-oriented, appropriate affect  Lab Results  Component Value Date   WBC 7.3 05/23/2023   HGB 12.6 05/23/2023   HCT 40.0 05/23/2023   MCV 92.2 05/23/2023   PLT 302 05/23/2023   Lab Results  Component Value Date   FERRITIN 47 05/23/2023   IRON 55 05/23/2023   TIBC 339 05/23/2023   UIBC 284 05/23/2023   IRONPCTSAT 16 05/23/2023   Lab Results  Component Value Date   RETICCTPCT 2.0 05/23/2023   RBC 4.39 05/23/2023   No results found for: KPAFRELGTCHN, LAMBDASER, KAPLAMBRATIO No results found for: IGGSERUM, IGA, IGMSERUM No results found for: STEPHANY CARLOTA BENSON MARKEL EARLA JOANNIE DOC VICK, SPEI   Chemistry      Component Value Date/Time   NA 139 11/02/2022 0611   K 3.5 11/02/2022 0611   CL 108 11/02/2022 0611   CO2 26 11/02/2022 0611   BUN 16 11/02/2022 0611   CREATININE 0.78 11/02/2022 0611   CREATININE 1.08 (H) 11/22/2021 1042   GLU 324 (H) 03/02/2009 1548      Component Value Date/Time   CALCIUM  8.2 (L) 11/02/2022 0611   ALKPHOS 62 11/01/2022 1858   AST 22 11/01/2022 1858   AST 20 11/22/2021 1042   ALT 25 11/01/2022 1858   ALT 20 11/22/2021 1042   BILITOT 0.6 11/01/2022 1858   BILITOT 0.3 11/22/2021 1042     Encounter Diagnosis  Name Primary?   Iron deficiency anemia due to chronic blood loss Yes    Impression and Plan: [name] is a very pleasant 83 yo caucasian female with history of iron deficiency anemia which is suspected to be due to chronic slow bleeding-GI seepage. She receives IV iron when needed. Her last infusion was on 02/27/2023.   Today her Hgb is 10.9 which is down  a bit from previous of 12.6  MCV is also normal at 86.6 Retic panel suggests iron deficiency  Iron studies pending. Will replenish as needed  RTC 6 months APP, labs (CBC, ferritin, iron, retic)  Lauraine CHRISTELLA Dais, PA-C 7/29/20259:24 AM

## 2023-11-22 ENCOUNTER — Ambulatory Visit: Payer: Self-pay | Admitting: Medical Oncology

## 2023-11-27 ENCOUNTER — Inpatient Hospital Stay: Attending: Medical Oncology

## 2023-11-27 VITALS — BP 156/70 | HR 78 | Temp 98.1°F | Resp 19

## 2023-11-27 DIAGNOSIS — F419 Anxiety disorder, unspecified: Secondary | ICD-10-CM | POA: Diagnosis not present

## 2023-11-27 DIAGNOSIS — Z87891 Personal history of nicotine dependence: Secondary | ICD-10-CM | POA: Diagnosis not present

## 2023-11-27 DIAGNOSIS — M1990 Unspecified osteoarthritis, unspecified site: Secondary | ICD-10-CM | POA: Diagnosis not present

## 2023-11-27 DIAGNOSIS — Z90710 Acquired absence of both cervix and uterus: Secondary | ICD-10-CM | POA: Insufficient documentation

## 2023-11-27 DIAGNOSIS — D509 Iron deficiency anemia, unspecified: Secondary | ICD-10-CM | POA: Diagnosis not present

## 2023-11-27 DIAGNOSIS — Z803 Family history of malignant neoplasm of breast: Secondary | ICD-10-CM | POA: Insufficient documentation

## 2023-11-27 DIAGNOSIS — E119 Type 2 diabetes mellitus without complications: Secondary | ICD-10-CM | POA: Diagnosis not present

## 2023-11-27 DIAGNOSIS — Z860100 Personal history of colon polyps, unspecified: Secondary | ICD-10-CM | POA: Insufficient documentation

## 2023-11-27 DIAGNOSIS — Z79899 Other long term (current) drug therapy: Secondary | ICD-10-CM | POA: Diagnosis not present

## 2023-11-27 DIAGNOSIS — Z90721 Acquired absence of ovaries, unilateral: Secondary | ICD-10-CM | POA: Insufficient documentation

## 2023-11-27 DIAGNOSIS — D500 Iron deficiency anemia secondary to blood loss (chronic): Secondary | ICD-10-CM

## 2023-11-27 DIAGNOSIS — Z833 Family history of diabetes mellitus: Secondary | ICD-10-CM | POA: Insufficient documentation

## 2023-11-27 DIAGNOSIS — Z8542 Personal history of malignant neoplasm of other parts of uterus: Secondary | ICD-10-CM | POA: Insufficient documentation

## 2023-11-27 DIAGNOSIS — R32 Unspecified urinary incontinence: Secondary | ICD-10-CM | POA: Insufficient documentation

## 2023-11-27 DIAGNOSIS — Z7982 Long term (current) use of aspirin: Secondary | ICD-10-CM | POA: Diagnosis not present

## 2023-11-27 DIAGNOSIS — Z8249 Family history of ischemic heart disease and other diseases of the circulatory system: Secondary | ICD-10-CM | POA: Insufficient documentation

## 2023-11-27 DIAGNOSIS — Z9221 Personal history of antineoplastic chemotherapy: Secondary | ICD-10-CM | POA: Diagnosis not present

## 2023-11-27 MED ORDER — SODIUM CHLORIDE 0.9 % IV SOLN: Freq: Once | INTRAVENOUS | Status: AC

## 2023-11-27 MED ORDER — SODIUM CHLORIDE 0.9 % IV SOLN
1000.0000 mg | Freq: Once | INTRAVENOUS | Status: AC
  Administered 2023-11-27: 1000 mg via INTRAVENOUS
  Filled 2023-11-27: qty 10

## 2023-11-27 NOTE — Patient Instructions (Signed)

## 2023-12-02 DIAGNOSIS — Z23 Encounter for immunization: Secondary | ICD-10-CM | POA: Diagnosis not present

## 2023-12-03 ENCOUNTER — Other Ambulatory Visit: Payer: Self-pay | Admitting: Internal Medicine

## 2023-12-11 DIAGNOSIS — N1831 Chronic kidney disease, stage 3a: Secondary | ICD-10-CM | POA: Diagnosis not present

## 2023-12-11 DIAGNOSIS — F418 Other specified anxiety disorders: Secondary | ICD-10-CM | POA: Diagnosis not present

## 2023-12-11 DIAGNOSIS — D509 Iron deficiency anemia, unspecified: Secondary | ICD-10-CM | POA: Diagnosis not present

## 2023-12-11 DIAGNOSIS — E7800 Pure hypercholesterolemia, unspecified: Secondary | ICD-10-CM | POA: Diagnosis not present

## 2023-12-11 DIAGNOSIS — R809 Proteinuria, unspecified: Secondary | ICD-10-CM | POA: Diagnosis not present

## 2023-12-11 DIAGNOSIS — I129 Hypertensive chronic kidney disease with stage 1 through stage 4 chronic kidney disease, or unspecified chronic kidney disease: Secondary | ICD-10-CM | POA: Diagnosis not present

## 2023-12-11 DIAGNOSIS — M810 Age-related osteoporosis without current pathological fracture: Secondary | ICD-10-CM | POA: Diagnosis not present

## 2023-12-11 DIAGNOSIS — C541 Malignant neoplasm of endometrium: Secondary | ICD-10-CM | POA: Diagnosis not present

## 2023-12-11 DIAGNOSIS — K5520 Angiodysplasia of colon without hemorrhage: Secondary | ICD-10-CM | POA: Diagnosis not present

## 2023-12-11 DIAGNOSIS — Z23 Encounter for immunization: Secondary | ICD-10-CM | POA: Diagnosis not present

## 2023-12-11 DIAGNOSIS — E1129 Type 2 diabetes mellitus with other diabetic kidney complication: Secondary | ICD-10-CM | POA: Diagnosis not present

## 2023-12-11 DIAGNOSIS — M5030 Other cervical disc degeneration, unspecified cervical region: Secondary | ICD-10-CM | POA: Diagnosis not present

## 2023-12-11 DIAGNOSIS — D649 Anemia, unspecified: Secondary | ICD-10-CM | POA: Diagnosis not present

## 2023-12-20 ENCOUNTER — Encounter: Payer: Self-pay | Admitting: Obstetrics & Gynecology

## 2023-12-20 ENCOUNTER — Inpatient Hospital Stay (HOSPITAL_BASED_OUTPATIENT_CLINIC_OR_DEPARTMENT_OTHER): Admitting: Obstetrics & Gynecology

## 2023-12-20 VITALS — BP 150/72 | HR 90 | Temp 97.7°F | Resp 19 | Wt 140.4 lb

## 2023-12-20 DIAGNOSIS — M1990 Unspecified osteoarthritis, unspecified site: Secondary | ICD-10-CM | POA: Diagnosis not present

## 2023-12-20 DIAGNOSIS — D509 Iron deficiency anemia, unspecified: Secondary | ICD-10-CM | POA: Diagnosis not present

## 2023-12-20 DIAGNOSIS — E119 Type 2 diabetes mellitus without complications: Secondary | ICD-10-CM | POA: Diagnosis not present

## 2023-12-20 DIAGNOSIS — R32 Unspecified urinary incontinence: Secondary | ICD-10-CM | POA: Diagnosis not present

## 2023-12-20 DIAGNOSIS — Z8542 Personal history of malignant neoplasm of other parts of uterus: Secondary | ICD-10-CM

## 2023-12-20 DIAGNOSIS — C549 Malignant neoplasm of corpus uteri, unspecified: Secondary | ICD-10-CM

## 2023-12-20 DIAGNOSIS — F419 Anxiety disorder, unspecified: Secondary | ICD-10-CM | POA: Diagnosis not present

## 2023-12-20 NOTE — Assessment & Plan Note (Signed)
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   Negative symptom review/exam NED   >Continue yearly follow-up    

## 2023-12-20 NOTE — Patient Instructions (Signed)
 Patient Education Tips for IAD Management 1. Skin Cleansing Clean promptly after incontinence episodes (urine or stool).  Use pH-balanced, fragrance-free cleansers rather than soap, which can be drying and irritating.  Avoid vigorous rubbing; instead, gently pat the skin dry.  No baby wipes with alcohol /perfume--they can worsen irritation.  2. Moisture and Barrier Protection Apply a barrier cream or ointment regularly (e.g., zinc oxide, dimethicone, petrolatum). ?? Teach: This puts a shield between your skin and moisture.  Reapply after each cleansing, especially after stool incontinence.  3. Containment and Absorbent Products Use absorbent pads, briefs, or underwear designed for incontinence -- not menstrual pads (which trap moisture).  Change products frequently, not just when saturated.  For those bedridden: use breathable underpads, avoid plastic-backed pads that trap heat.  4. Incontinence Management Encourage scheduled toileting (e.g., every 2-4 hours) to minimize skin exposure.  For urinary urgency: pelvic floor exercises and bladder training may help reduce accidents.  For fecal incontinence: dietary fiber adjustments, hydration, or stool-bulking agents may help.  5. Clothing and Positioning Wear loose, breathable clothing and cotton underwear.  Avoid tight synthetic fabrics that hold moisture.  For immobile patients: reposition regularly to reduce pressure and friction.  6. Infection Awareness Teach patients/caregivers to watch for signs of secondary infection:  Redness spreading beyond exposed area  Increased pain, swelling, warmth  Pustules or oozing  Foul odor  Stress: Call your doctor if irritation worsens or if you see signs of infection.  7. General Skin Health Maintain hydration and nutrition (adequate protein, vitamins A & C, zinc) to support skin integrity.  Avoid unnecessary topical antibiotics or antifungals unless prescribed.  ?? Key  Messages to Patients "Cleanse, protect, and keep the skin dry."  "Moisture is the enemy of skin--barrier creams are your friend."  "Tell us  early if redness or pain worsens; we want to prevent infection."   Return prn or in 1 year

## 2023-12-20 NOTE — Progress Notes (Signed)
 Follow Up Note: Gyn-Onc  [name] 83 y.o. female  CC: She presents for a f/u visit.  HPI:  Oncology History  Malignant neoplasm of corpus uteri, except isthmus (HCC)  10/2008 Surgery    robotic-assisted laparoscopic hysterectomy, bilateral salpingo- oophorectomy, lymph node dissection   2010 -  Chemotherapy    She was enrolled in GOG protocol 258 and was randomized to receive 6 cycles of Taxol and carboplatin    2011 Relapse/Recurrence    Recurrent disease was identified at a laparoscopic port site, pelvic lymph nodes and in the vagina   05/16/2011 Initial Diagnosis   Malignant neoplasm of corpus uteri, except isthmus (HCC)    - 2011 Radiation Therapy   adjuvant external beam and vaginal brachytherapy     - 09/2010 Chemotherapy    Taxol carboplatin therapy       Interval History:   She denies vaginal bleeding, abdominal/pelvic pain, cough or abdominal distension.  Discussed the use of AI scribe software for clinical note transcription with the patient, who gave verbal consent to proceed. She is managing ongoing gastrointestinal issues, requiring regular monitoring of her iron levels. She recently underwent an iron infusion about a month ago and had subsequent blood work to assess her iron status, with no concerning results reported.  She experiences urinary incontinence, leading to irritation from urine exposure. She attempts to move every hour while working to manage this, although it is not always feasible. Desitin cream is used to alleviate the irritation, which has been helpful.  She continues to use a steroid ointment for her vulva LS, applying it about twice a week.  Her weight has been stable, with slight variations between different scales. No bleeding, pain, or changes in energy levels are reported. She continues to work and maintains a good energy level.     Review of Systems   Review of Systems  Constitutional:  Negative for malaise/fatigue and weight loss.  Respiratory:  Negative for cough.   Gastrointestinal:  Negative for abdominal pain.  Genitourinary:        No bleeding   Current Meds:  Outpatient Encounter Medications as of 12/20/2023  Medication Sig   aspirin  325 MG tablet Take 325 mg by mouth daily.   atorvastatin  (LIPITOR) 40 MG tablet Take 40 mg by mouth at bedtime.   B Complex-C-Folic Acid (FOLBEE PLUS) TABS Take 1 tablet by mouth daily.   benazepril  (LOTENSIN ) 40 MG tablet Take 40 mg by mouth daily.   cetirizine (ZYRTEC) 10 MG tablet Take 10 mg by mouth daily as needed.   Cholecalciferol  (VITAMIN D ) 125 MCG (5000 UT) CAPS Take 5,000 Units by mouth daily.   clobetasol  ointment (TEMOVATE ) 0.05 % Apply 1 Application topically 2 (two) times a week.   JANUMET XR [phone] MG TB24 Take 1 tablet by mouth every evening.   lidocaine  (LIDODERM ) 5 % Place 1 patch onto the skin daily. Remove & Discard patch within 12 hours or as directed by MD   LORazepam  (ATIVAN ) 0.5 MG tablet Take 0.25 mg by mouth as needed for anxiety.   meclizine  (ANTIVERT ) 25 MG tablet Take 12.5 mg by mouth 2 (two) times daily.   methocarbamol  (ROBAXIN ) 500 MG tablet Take 1 tablet (500 mg total) by mouth every 8 (eight) hours as needed.   Multiple Vitamin (MULTIVITAMIN) tablet Take 1 tablet by mouth daily.   naproxen sodium (ALEVE) 220 MG tablet Take 220 mg by mouth.   omeprazole  (PRILOSEC) 40 MG capsule Take 1 capsule (40 mg total)  by mouth daily. Office visit for further refills   Polyethyl Glycol-Propyl Glycol 0.4-0.3 % SOLN Place 1 drop into both eyes daily as needed (for dry eyes).   polyethylene glycol (MIRALAX  / GLYCOLAX ) 17 g packet Take 17 g by mouth daily as needed for mild constipation or moderate constipation.   No facility-administered encounter medications on file as of 12/20/2023.    Allergy:  Allergies  Allergen Reactions   Phenobarbital Other (See Comments)     Feeling of uneasiness    Social Hx:   Social History   Socioeconomic History   Marital status: Widowed    Spouse name: Not on file   Number of children: 1   Years of education: Not on file   Highest education level: Not on file  Occupational History   Not on file  Tobacco Use   Smoking status: Former    Current packs/day: 0.00    Average packs/day: 1 pack/day for 10.0 years (10.0 ttl pk-yrs)    Types: Cigarettes    Start date: 05/12/1966    Quit date: 05/12/1976    Years since quitting: 47.6   Smokeless tobacco: Never  Vaping Use   Vaping status: Never Used  Substance and Sexual Activity   Alcohol  use: No   Drug use: No   Sexual activity: Not Currently  Other Topics Concern   Not on file  Social History Narrative   Not on file   Social Drivers of Health   Financial Resource Strain: Not on file  Food Insecurity: No Food Insecurity (11/02/2022)   Hunger Vital Sign    Worried About Running Out of Food in the Last Year: Never true    Ran Out of Food in the Last Year: Never true  Transportation Needs: No Transportation Needs (11/02/2022)   PRAPARE - Administrator, Civil Service (Medical): No    Lack of Transportation (Non-Medical): No  Physical Activity: Not on file  Stress: Not on file  Social Connections: Not on file  Intimate Partner Violence: Not At Risk (11/02/2022)   Humiliation, Afraid, Rape, and Kick questionnaire    Fear of Current or Ex-Partner: No    Emotionally Abused: No    Physically Abused: No    Sexually Abused: No    Past Surgical Hx:  Past Surgical History:  Procedure Laterality Date   ABDOMINAL HYSTERECTOMY     BREAST LUMPECTOMY  1993   Left breast, benign   ROBOTIC ASSISTED LAPAROSCOPIC VAGINAL HYSTERECTOMY WITH FIBROID REMOVAL  July 2010   bilat. salpingo-oophorectomy    Past Medical Hx:  Past Medical History:  Diagnosis Date   Allergy    Anxiety    takes ativan  as needed   Arthritis    Benign positional vertigo     Bursitis of shoulder, left    Cancer (HCC)    Endometrial ca/Recurrence   CKD (chronic kidney disease), stage III (HCC)    Depression    Diabetes (HCC)    History of colonic polyps    Hypercholesteremia    Hypercholesterolemia    Hypertension    Lumbar pain    Osteoarthritis    Osteoporosis    S/P radiation therapy July 29, 2010-Sep 06, 2010   External beam of pelvis   S/P radiation therapy 09/15/10, 09/24/10, 09/28/2010   Intracavitary brachytherapy   SBO (small bowel obstruction) (HCC)    Status post chemotherapy    carboplatin/paclitaxel x 6 rounds    Family Hx:  Family History  Problem Relation Age  of Onset   Breast cancer Mother    Heart disease Father    Breast cancer Sister    Diabetes Sister    Colon cancer Neg Hx    Esophageal cancer Neg Hx    Rectal cancer Neg Hx    Stomach cancer Neg Hx     Vitals:  BP (!) 162/72 (BP Location: Left Arm, Patient Position: Sitting)   Pulse 90   Temp 97.7 F (36.5 C) (Oral)   Resp 19   Wt 140 lb 6.4 oz (63.7 kg)   SpO2 99%   BMI 24.10 kg/m   Physical Exam Abdominal:     General: There is no distension.     Palpations: Abdomen is soft.     Tenderness: There is no abdominal tenderness.  Genitourinary:    Adnexa:        Right: No mass.         Left: No mass.       Rectum: Normal.     Comments: Vulva: white, waxy skin; petechiae, purpuric areas Vagina shortened w/synechiae at the apex Lymphadenopathy:     Upper Body:     Right upper body: No supraclavicular adenopathy.     Left upper body: No supraclavicular adenopathy.     Lower Body: No right inguinal adenopathy. No left inguinal adenopathy.  Skin:    General: Skin is warm and dry.  Neurological:     Mental Status: She is alert and oriented to person, place, and time.    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   Negative symptom review/exam NED   >Continue yearly follow-up    I  personally spent 25 minutes face-to-face and non-face-to-face in the care of this patient, which includes all pre, intra, and post visit time on the date of service.   Olam Mill, MD 12/20/2023, 10:08 AM

## 2024-03-07 ENCOUNTER — Other Ambulatory Visit: Payer: Self-pay | Admitting: Internal Medicine

## 2024-03-07 DIAGNOSIS — Z1231 Encounter for screening mammogram for malignant neoplasm of breast: Secondary | ICD-10-CM | POA: Diagnosis not present

## 2024-03-08 DIAGNOSIS — D509 Iron deficiency anemia, unspecified: Secondary | ICD-10-CM | POA: Diagnosis not present

## 2024-03-08 DIAGNOSIS — I129 Hypertensive chronic kidney disease with stage 1 through stage 4 chronic kidney disease, or unspecified chronic kidney disease: Secondary | ICD-10-CM | POA: Diagnosis not present

## 2024-03-08 DIAGNOSIS — K219 Gastro-esophageal reflux disease without esophagitis: Secondary | ICD-10-CM | POA: Diagnosis not present

## 2024-03-08 DIAGNOSIS — C541 Malignant neoplasm of endometrium: Secondary | ICD-10-CM | POA: Diagnosis not present

## 2024-03-08 DIAGNOSIS — H9193 Unspecified hearing loss, bilateral: Secondary | ICD-10-CM | POA: Diagnosis not present

## 2024-03-08 DIAGNOSIS — E1129 Type 2 diabetes mellitus with other diabetic kidney complication: Secondary | ICD-10-CM | POA: Diagnosis not present

## 2024-03-08 DIAGNOSIS — S329XXS Fracture of unspecified parts of lumbosacral spine and pelvis, sequela: Secondary | ICD-10-CM | POA: Diagnosis not present

## 2024-03-08 DIAGNOSIS — E7800 Pure hypercholesterolemia, unspecified: Secondary | ICD-10-CM | POA: Diagnosis not present

## 2024-03-08 DIAGNOSIS — N1831 Chronic kidney disease, stage 3a: Secondary | ICD-10-CM | POA: Diagnosis not present

## 2024-03-08 DIAGNOSIS — M810 Age-related osteoporosis without current pathological fracture: Secondary | ICD-10-CM | POA: Diagnosis not present

## 2024-03-08 DIAGNOSIS — M1990 Unspecified osteoarthritis, unspecified site: Secondary | ICD-10-CM | POA: Diagnosis not present

## 2024-03-08 DIAGNOSIS — E559 Vitamin D deficiency, unspecified: Secondary | ICD-10-CM | POA: Diagnosis not present

## 2024-03-20 ENCOUNTER — Other Ambulatory Visit (HOSPITAL_COMMUNITY): Payer: Self-pay | Admitting: Internal Medicine

## 2024-03-20 DIAGNOSIS — E559 Vitamin D deficiency, unspecified: Secondary | ICD-10-CM | POA: Insufficient documentation

## 2024-04-01 ENCOUNTER — Inpatient Hospital Stay (HOSPITAL_COMMUNITY): Admission: RE | Admit: 2024-04-01

## 2024-04-04 ENCOUNTER — Inpatient Hospital Stay (HOSPITAL_COMMUNITY): Admission: RE | Admit: 2024-04-04 | Discharge: 2024-04-04 | Attending: *Deleted | Admitting: *Deleted

## 2024-04-04 VITALS — BP 126/69 | HR 98 | Temp 97.3°F | Resp 16

## 2024-04-04 DIAGNOSIS — M810 Age-related osteoporosis without current pathological fracture: Secondary | ICD-10-CM | POA: Diagnosis present

## 2024-04-04 DIAGNOSIS — E559 Vitamin D deficiency, unspecified: Secondary | ICD-10-CM | POA: Insufficient documentation

## 2024-04-04 MED ORDER — DENOSUMAB 60 MG/ML ~~LOC~~ SOSY
60.0000 mg | PREFILLED_SYRINGE | Freq: Once | SUBCUTANEOUS | Status: AC
  Administered 2024-04-04: 60 mg via SUBCUTANEOUS

## 2024-04-04 MED ORDER — DENOSUMAB 60 MG/ML ~~LOC~~ SOSY
PREFILLED_SYRINGE | SUBCUTANEOUS | Status: AC
  Filled 2024-04-04: qty 1

## 2024-05-23 ENCOUNTER — Inpatient Hospital Stay: Admitting: Medical Oncology

## 2024-05-23 ENCOUNTER — Other Ambulatory Visit: Payer: Self-pay | Admitting: Medical Oncology

## 2024-05-23 ENCOUNTER — Ambulatory Visit: Payer: Self-pay | Admitting: Medical Oncology

## 2024-05-23 ENCOUNTER — Inpatient Hospital Stay: Attending: Medical Oncology

## 2024-05-23 VITALS — BP 139/75 | HR 89 | Temp 97.6°F | Resp 19 | Ht 65.0 in | Wt 144.0 lb

## 2024-05-23 DIAGNOSIS — D500 Iron deficiency anemia secondary to blood loss (chronic): Secondary | ICD-10-CM | POA: Diagnosis not present

## 2024-05-23 LAB — RETIC PANEL
Immature Retic Fract: 15.2 % (ref 2.3–15.9)
RBC.: 4.27 MIL/uL (ref 3.87–5.11)
Retic Count, Absolute: 57.6 10*3/uL (ref 19.0–186.0)
Retic Ct Pct: 1.4 % (ref 0.4–3.1)
Reticulocyte Hemoglobin: 29.8 pg

## 2024-05-23 LAB — CBC
HCT: 39.1 % (ref 36.0–46.0)
Hemoglobin: 12.2 g/dL (ref 12.0–15.0)
MCH: 28.4 pg (ref 26.0–34.0)
MCHC: 31.2 g/dL (ref 30.0–36.0)
MCV: 91.1 fL (ref 80.0–100.0)
Platelets: 329 10*3/uL (ref 150–400)
RBC: 4.29 MIL/uL (ref 3.87–5.11)
RDW: 14.6 % (ref 11.5–15.5)
WBC: 6.7 10*3/uL (ref 4.0–10.5)
nRBC: 0 % (ref 0.0–0.2)

## 2024-05-23 LAB — IRON AND IRON BINDING CAPACITY (CC-WL,HP ONLY)
Iron: 36 ug/dL (ref 28–170)
Saturation Ratios: 10 % — ABNORMAL LOW (ref 10.4–31.8)
TIBC: 378 ug/dL (ref 250–450)
UIBC: 342 ug/dL

## 2024-05-23 LAB — FERRITIN: Ferritin: 15 ng/mL (ref 11–307)

## 2024-05-23 MED ORDER — ONDANSETRON 4 MG PO TBDP: 4.0000 mg | ORAL_TABLET | Freq: Three times a day (TID) | ORAL | 0 refills | Status: AC | PRN

## 2024-05-23 NOTE — Progress Notes (Signed)
 SABRA

## 2024-05-23 NOTE — Addendum Note (Signed)
 Addended by: TONETTE DOMINO on: 05/23/2024 09:31 AM   Modules accepted: Orders

## 2024-05-23 NOTE — Progress Notes (Addendum)
 " Hematology and Oncology Follow Up Visit  [name] [id number] 1940/11/08 84 y.o. 05/23/2024   Principle Diagnosis:  Iron deficiency anemia    Current Therapy:        IV iron as indicated  --Monoferric - last given 02/27/2023   Interim History:  [name] is here today for follow-up. She is here with her daughter.   Today she states that she has been doing well. No concerns or health changes for her to report.   There has been no cough or shortness of breath.  No new constipation.   There has been no bleeding to her knowledge: denies epistaxis, gingivitis, hemoptysis, hematemesis, hematuria, melena, excessive bruising, blood donation.   She is followed by Dr. Abran with [facility]. She is UTD on her colonoscopy/endoscopy. She gets stomach episodes every 8-12 weeks where she gets very nauseated and had pain. In the past she has used zofran  with success and tolerated it well.   She has had no rashes.  There has been no leg swelling.  She has had no fever.  She has had no headache.  Overall, I would say that her performance status is probably ECOG 1.   Wt Readings from Last 3 Encounters:  05/23/24 144 lb (65.3 kg)  12/20/23 140 lb 6.4 oz (63.7 kg)  11/21/23 139 lb 1.3 oz (63.1 kg)   Medications:  Allergies as of 05/23/2024       Reactions   Phenobarbital Other (See Comments)   Feeling of uneasiness        Medication List        Accurate as of May 23, 2024  9:22 AM. If you have any questions, ask your nurse or doctor.          aspirin  325 MG tablet Take 325 mg by mouth daily.   atorvastatin  40 MG tablet Commonly known as: LIPITOR Take 40 mg by mouth at bedtime.   benazepril  40 MG tablet Commonly known as: LOTENSIN  Take 40 mg by mouth daily.   cetirizine 10 MG tablet Commonly known as: ZYRTEC Take 10 mg by mouth daily as needed.   clobetasol  ointment 0.05 % Commonly known as: TEMOVATE  Apply 1 Application topically 2 (two) times a week.    Folbee Plus Tabs Take 1 tablet by mouth daily.   Janumet XR [phone] MG Tb24 Generic drug: SitaGLIPtin-MetFORMIN HCl Take 1 tablet by mouth every evening.   lidocaine  5 % Commonly known as: Lidoderm  Place 1 patch onto the skin daily. Remove & Discard patch within 12 hours or as directed by MD   LORazepam  0.5 MG tablet Commonly known as: ATIVAN  Take 0.25 mg by mouth as needed for anxiety.   meclizine  25 MG tablet Commonly known as: ANTIVERT  Take 12.5 mg by mouth 2 (two) times daily.   methocarbamol  500 MG tablet Commonly known as: ROBAXIN  Take 1 tablet (500 mg total) by mouth every 8 (eight) hours as needed.   multivitamin tablet Take 1 tablet by mouth daily.   naproxen sodium 220 MG tablet Commonly known as: ALEVE Take 220 mg by mouth.   omeprazole  40 MG capsule Commonly known as: PRILOSEC Take 1 capsule (40 mg total) by mouth daily. Office visit for further refills   Polyethyl Glycol-Propyl Glycol 0.4-0.3 % Soln Place 1 drop into both eyes daily as needed (for dry eyes).   polyethylene glycol 17 g packet Commonly known as: MIRALAX  / GLYCOLAX  Take 17 g by mouth daily as needed for mild constipation or moderate constipation.  Vitamin D  125 MCG (5000 UT) Caps Take 5,000 Units by mouth daily.        Allergies:  Allergies  Allergen Reactions   Phenobarbital Other (See Comments)    Feeling of uneasiness    Past Medical History, Surgical history, Social history, and Family History were reviewed and updated.  Review of Systems: All other 10 point review of systems is negative.   Physical Exam:  height is 5' 5 (1.651 m) and weight is 144 lb (65.3 kg). Her oral temperature is 97.6 F (36.4 C). Her blood pressure is 139/75 and her pulse is 89. Her respiration is 19 and oxygen saturation is 99%.   Wt Readings from Last 3 Encounters:  05/23/24 144 lb (65.3 kg)  12/20/23 140 lb 6.4 oz (63.7 kg)  11/21/23 139 lb 1.3 oz (63.1 kg)    Ocular: Sclerae  unicteric, pupils equal, round and reactive to light Ear-nose-throat: Oropharynx clear, dentition fair Lymphatic: No cervical or supraclavicular adenopathy Lungs no rales or rhonchi, good excursion bilaterally Heart regular rate and rhythm, no murmur appreciated Abd soft, nontender, positive bowel sounds MSK no focal spinal tenderness, no joint edema Neuro: non-focal, well-oriented, appropriate affect  Lab Results  Component Value Date   WBC 6.7 05/23/2024   HGB 12.2 05/23/2024   HCT 39.1 05/23/2024   MCV 91.1 05/23/2024   PLT 329 05/23/2024   Lab Results  Component Value Date   FERRITIN 11 11/21/2023   IRON 38 11/21/2023   TIBC 437 11/21/2023   UIBC 399 11/21/2023   IRONPCTSAT 9 (L) 11/21/2023   Lab Results  Component Value Date   RETICCTPCT 1.4 05/23/2024   RBC 4.27 05/23/2024   RBC 4.29 05/23/2024   No results found for: KPAFRELGTCHN, LAMBDASER, KAPLAMBRATIO No results found for: IGGSERUM, IGA, IGMSERUM No results found for: STEPHANY CARLOTA BENSON MARKEL EARLA JOANNIE DOC VICK, SPEI   Chemistry      Component Value Date/Time   NA 139 11/02/2022 0611   K 3.5 11/02/2022 0611   CL 108 11/02/2022 0611   CO2 26 11/02/2022 0611   BUN 16 11/02/2022 0611   CREATININE 0.78 11/02/2022 0611   CREATININE 1.08 (H) 11/22/2021 1042   GLU 324 (H) 03/02/2009 1548      Component Value Date/Time   CALCIUM  8.2 (L) 11/02/2022 0611   ALKPHOS 62 11/01/2022 1858   AST 22 11/01/2022 1858   AST 20 11/22/2021 1042   ALT 25 11/01/2022 1858   ALT 20 11/22/2021 1042   BILITOT 0.6 11/01/2022 1858   BILITOT 0.3 11/22/2021 1042     Encounter Diagnosis  Name Primary?   Iron deficiency anemia due to chronic blood loss Yes     Impression and Plan: [name] is a very pleasant 84 yo caucasian female with history of iron deficiency anemia which is suspected to be due to chronic slow bleeding-GI seepage. She receives IV iron when needed.  Her last infusion was on 11/27/2023.   Today her Hgb is 12.2 which is up from previous value of 10.9 Normal retic panel Iron studies pending. Will replenish as needed Refilled her zofran  to use as needed- continue follow up with GI.   RTC 6 months APP, labs (CBC, ferritin, iron, retic)  Lauraine CHRISTELLA Dais, PA-C 1/29/20269:22 AM  "

## 2024-06-04 ENCOUNTER — Inpatient Hospital Stay

## 2024-10-01 ENCOUNTER — Encounter (HOSPITAL_COMMUNITY)

## 2024-10-04 ENCOUNTER — Encounter (HOSPITAL_COMMUNITY)

## 2024-11-19 ENCOUNTER — Inpatient Hospital Stay: Admitting: Medical Oncology

## 2024-11-19 ENCOUNTER — Inpatient Hospital Stay
